# Patient Record
Sex: Male | Born: 1935 | Race: White | Hispanic: No | Marital: Married | Smoking: Former smoker
Health system: Southern US, Community
[De-identification: ages and names within clinical notes are randomized; demographics above are authoritative.]

## PROBLEM LIST (undated history)

## (undated) DIAGNOSIS — I10 Essential (primary) hypertension: Secondary | ICD-10-CM

## (undated) DIAGNOSIS — N400 Enlarged prostate without lower urinary tract symptoms: Secondary | ICD-10-CM

## (undated) DIAGNOSIS — C4490 Unspecified malignant neoplasm of skin, unspecified: Secondary | ICD-10-CM

## (undated) DIAGNOSIS — Z95828 Presence of other vascular implants and grafts: Secondary | ICD-10-CM

## (undated) DIAGNOSIS — I779 Disorder of arteries and arterioles, unspecified: Secondary | ICD-10-CM

## (undated) DIAGNOSIS — E785 Hyperlipidemia, unspecified: Secondary | ICD-10-CM

## (undated) DIAGNOSIS — Z950 Presence of cardiac pacemaker: Secondary | ICD-10-CM

## (undated) DIAGNOSIS — I739 Peripheral vascular disease, unspecified: Secondary | ICD-10-CM

## (undated) DIAGNOSIS — I480 Paroxysmal atrial fibrillation: Secondary | ICD-10-CM

## (undated) DIAGNOSIS — F411 Generalized anxiety disorder: Secondary | ICD-10-CM

## (undated) DIAGNOSIS — Z952 Presence of prosthetic heart valve: Secondary | ICD-10-CM

## (undated) DIAGNOSIS — I350 Nonrheumatic aortic (valve) stenosis: Secondary | ICD-10-CM

## (undated) DIAGNOSIS — A0472 Enterocolitis due to Clostridium difficile, not specified as recurrent: Secondary | ICD-10-CM

## (undated) DIAGNOSIS — I2510 Atherosclerotic heart disease of native coronary artery without angina pectoris: Secondary | ICD-10-CM

## (undated) HISTORY — DX: Peripheral vascular disease, unspecified: I73.9

## (undated) HISTORY — DX: Paroxysmal atrial fibrillation: I48.0

## (undated) HISTORY — DX: Enterocolitis due to Clostridium difficile, not specified as recurrent: A04.72

## (undated) HISTORY — DX: Hyperlipidemia, unspecified: E78.5

## (undated) HISTORY — DX: Essential (primary) hypertension: I10

---

## 1996-08-30 HISTORY — PX: TRANSURETHRAL RESECTION OF PROSTATE: SHX73

## 1997-08-30 HISTORY — PX: CARDIAC CATHETERIZATION: SHX172

## 1998-04-28 ENCOUNTER — Encounter: Payer: Self-pay | Admitting: Cardiology

## 1998-04-28 ENCOUNTER — Inpatient Hospital Stay (HOSPITAL_COMMUNITY): Admission: AD | Admit: 1998-04-28 | Discharge: 1998-05-08 | Payer: Self-pay | Admitting: Cardiology

## 1998-04-30 ENCOUNTER — Encounter: Payer: Self-pay | Admitting: Thoracic Surgery (Cardiothoracic Vascular Surgery)

## 1998-04-30 HISTORY — PX: CORONARY ARTERY BYPASS GRAFT: SHX141

## 1998-05-02 ENCOUNTER — Encounter: Payer: Self-pay | Admitting: Thoracic Surgery (Cardiothoracic Vascular Surgery)

## 1998-05-05 ENCOUNTER — Encounter: Payer: Self-pay | Admitting: Cardiothoracic Surgery

## 1998-05-06 ENCOUNTER — Encounter: Payer: Self-pay | Admitting: Cardiothoracic Surgery

## 1998-05-07 ENCOUNTER — Encounter: Payer: Self-pay | Admitting: Thoracic Surgery (Cardiothoracic Vascular Surgery)

## 1998-05-27 ENCOUNTER — Encounter (HOSPITAL_COMMUNITY): Admission: RE | Admit: 1998-05-27 | Discharge: 1998-08-25 | Payer: Self-pay | Admitting: Cardiology

## 1999-08-17 ENCOUNTER — Encounter (INDEPENDENT_AMBULATORY_CARE_PROVIDER_SITE_OTHER): Payer: Self-pay

## 1999-08-17 ENCOUNTER — Ambulatory Visit (HOSPITAL_COMMUNITY): Admission: RE | Admit: 1999-08-17 | Discharge: 1999-08-17 | Payer: Self-pay | Admitting: Gastroenterology

## 1999-11-12 ENCOUNTER — Other Ambulatory Visit: Admission: RE | Admit: 1999-11-12 | Discharge: 1999-11-12 | Payer: Self-pay | Admitting: Urology

## 2000-06-05 ENCOUNTER — Encounter: Payer: Self-pay | Admitting: Emergency Medicine

## 2000-06-05 ENCOUNTER — Emergency Department (HOSPITAL_COMMUNITY): Admission: EM | Admit: 2000-06-05 | Discharge: 2000-06-05 | Payer: Self-pay | Admitting: Emergency Medicine

## 2000-06-30 HISTORY — PX: INTRAOPERATIVE ARTERIOGRAM: SHX5157

## 2000-07-06 ENCOUNTER — Ambulatory Visit (HOSPITAL_COMMUNITY): Admission: RE | Admit: 2000-07-06 | Discharge: 2000-07-06 | Payer: Self-pay | Admitting: *Deleted

## 2000-07-06 ENCOUNTER — Encounter: Payer: Self-pay | Admitting: *Deleted

## 2000-07-11 ENCOUNTER — Encounter: Payer: Self-pay | Admitting: *Deleted

## 2000-07-13 ENCOUNTER — Encounter (INDEPENDENT_AMBULATORY_CARE_PROVIDER_SITE_OTHER): Payer: Self-pay | Admitting: Specialist

## 2000-07-13 ENCOUNTER — Inpatient Hospital Stay (HOSPITAL_COMMUNITY): Admission: RE | Admit: 2000-07-13 | Discharge: 2000-07-15 | Payer: Self-pay | Admitting: *Deleted

## 2001-03-10 ENCOUNTER — Ambulatory Visit (HOSPITAL_BASED_OUTPATIENT_CLINIC_OR_DEPARTMENT_OTHER): Admission: RE | Admit: 2001-03-10 | Discharge: 2001-03-10 | Payer: Self-pay | Admitting: Orthopedic Surgery

## 2001-05-31 ENCOUNTER — Encounter: Payer: Self-pay | Admitting: Chiropractic Medicine

## 2001-05-31 ENCOUNTER — Encounter: Admission: RE | Admit: 2001-05-31 | Discharge: 2001-05-31 | Payer: Self-pay | Admitting: Chiropractic Medicine

## 2002-08-21 ENCOUNTER — Encounter: Payer: Self-pay | Admitting: Gastroenterology

## 2002-08-21 ENCOUNTER — Encounter: Admission: RE | Admit: 2002-08-21 | Discharge: 2002-08-21 | Payer: Self-pay | Admitting: Gastroenterology

## 2002-08-30 HISTORY — PX: CAROTID ENDARTERECTOMY: SUR193

## 2002-09-18 ENCOUNTER — Ambulatory Visit (HOSPITAL_COMMUNITY): Admission: RE | Admit: 2002-09-18 | Discharge: 2002-09-18 | Payer: Self-pay | Admitting: Internal Medicine

## 2002-09-18 ENCOUNTER — Encounter: Payer: Self-pay | Admitting: Internal Medicine

## 2004-10-16 ENCOUNTER — Ambulatory Visit (HOSPITAL_COMMUNITY): Admission: RE | Admit: 2004-10-16 | Discharge: 2004-10-17 | Payer: Self-pay | Admitting: *Deleted

## 2005-12-29 ENCOUNTER — Encounter: Payer: Self-pay | Admitting: Thoracic Surgery (Cardiothoracic Vascular Surgery)

## 2007-03-08 ENCOUNTER — Encounter: Admission: RE | Admit: 2007-03-08 | Discharge: 2007-04-12 | Payer: Self-pay | Admitting: Orthopedic Surgery

## 2008-07-10 ENCOUNTER — Encounter: Payer: Self-pay | Admitting: Cardiology

## 2009-06-03 ENCOUNTER — Encounter: Payer: Self-pay | Admitting: Cardiology

## 2009-07-16 ENCOUNTER — Inpatient Hospital Stay (HOSPITAL_COMMUNITY): Admission: RE | Admit: 2009-07-16 | Discharge: 2009-07-19 | Payer: Self-pay | Admitting: Orthopedic Surgery

## 2009-07-16 HISTORY — PX: TOTAL KNEE ARTHROPLASTY: SHX125

## 2009-08-11 ENCOUNTER — Encounter: Admission: RE | Admit: 2009-08-11 | Discharge: 2009-08-28 | Payer: Self-pay | Admitting: Orthopedic Surgery

## 2009-08-28 ENCOUNTER — Encounter: Admission: RE | Admit: 2009-08-28 | Discharge: 2009-10-07 | Payer: Self-pay | Admitting: Orthopedic Surgery

## 2009-08-30 HISTORY — PX: CATARACT EXTRACTION, BILATERAL: SHX1313

## 2010-03-23 ENCOUNTER — Inpatient Hospital Stay (HOSPITAL_COMMUNITY): Admission: RE | Admit: 2010-03-23 | Discharge: 2010-03-24 | Payer: Self-pay | Admitting: Cardiology

## 2010-03-25 HISTORY — PX: PACEMAKER INSERTION: SHX728

## 2010-03-30 DIAGNOSIS — A0472 Enterocolitis due to Clostridium difficile, not specified as recurrent: Secondary | ICD-10-CM

## 2010-03-30 HISTORY — DX: Enterocolitis due to Clostridium difficile, not specified as recurrent: A04.72

## 2010-04-21 ENCOUNTER — Inpatient Hospital Stay (HOSPITAL_COMMUNITY): Admission: EM | Admit: 2010-04-21 | Discharge: 2010-04-24 | Payer: Self-pay | Admitting: Emergency Medicine

## 2010-04-22 ENCOUNTER — Encounter (INDEPENDENT_AMBULATORY_CARE_PROVIDER_SITE_OTHER): Payer: Self-pay | Admitting: Internal Medicine

## 2010-05-02 ENCOUNTER — Inpatient Hospital Stay (HOSPITAL_COMMUNITY): Admission: EM | Admit: 2010-05-02 | Discharge: 2010-05-04 | Payer: Self-pay | Admitting: Emergency Medicine

## 2010-05-11 ENCOUNTER — Encounter (INDEPENDENT_AMBULATORY_CARE_PROVIDER_SITE_OTHER): Payer: Self-pay | Admitting: Internal Medicine

## 2010-05-14 ENCOUNTER — Inpatient Hospital Stay (HOSPITAL_COMMUNITY): Admission: EM | Admit: 2010-05-14 | Discharge: 2010-05-26 | Payer: Self-pay | Admitting: Emergency Medicine

## 2010-05-18 ENCOUNTER — Ambulatory Visit: Payer: Self-pay | Admitting: Infectious Diseases

## 2010-05-21 ENCOUNTER — Ambulatory Visit: Payer: Self-pay | Admitting: Vascular Surgery

## 2010-05-21 ENCOUNTER — Encounter (INDEPENDENT_AMBULATORY_CARE_PROVIDER_SITE_OTHER): Payer: Self-pay | Admitting: Internal Medicine

## 2010-05-21 DIAGNOSIS — B259 Cytomegaloviral disease, unspecified: Secondary | ICD-10-CM

## 2010-06-16 ENCOUNTER — Encounter: Payer: Self-pay | Admitting: Infectious Diseases

## 2010-06-24 ENCOUNTER — Ambulatory Visit: Payer: Self-pay | Admitting: Infectious Diseases

## 2010-06-24 DIAGNOSIS — I10 Essential (primary) hypertension: Secondary | ICD-10-CM | POA: Insufficient documentation

## 2010-06-24 DIAGNOSIS — A0472 Enterocolitis due to Clostridium difficile, not specified as recurrent: Secondary | ICD-10-CM | POA: Insufficient documentation

## 2010-06-24 DIAGNOSIS — K5190 Ulcerative colitis, unspecified, without complications: Secondary | ICD-10-CM

## 2010-06-24 HISTORY — DX: Essential (primary) hypertension: I10

## 2010-06-24 HISTORY — DX: Ulcerative colitis, unspecified, without complications: K51.90

## 2010-07-01 ENCOUNTER — Encounter: Payer: Self-pay | Admitting: Infectious Diseases

## 2010-07-08 ENCOUNTER — Telehealth: Payer: Self-pay | Admitting: Infectious Diseases

## 2010-07-13 ENCOUNTER — Encounter: Payer: Self-pay | Admitting: Infectious Diseases

## 2010-07-15 ENCOUNTER — Ambulatory Visit: Payer: Self-pay | Admitting: Psychiatry

## 2010-07-16 ENCOUNTER — Ambulatory Visit: Payer: Self-pay | Admitting: Psychiatry

## 2010-07-22 ENCOUNTER — Telehealth: Payer: Self-pay | Admitting: Infectious Diseases

## 2010-07-29 ENCOUNTER — Encounter: Payer: Self-pay | Admitting: Infectious Diseases

## 2010-07-29 LAB — CONVERTED CEMR LAB
Chloride: 101 meq/L (ref 96–112)
Glucose, Bld: 86 mg/dL (ref 70–99)
Lymphs Abs: 1.2 10*3/uL (ref 0.7–4.0)
MCV: 99.2 fL (ref 78.0–100.0)
Monocytes Absolute: 0.3 10*3/uL (ref 0.1–1.0)
Monocytes Relative: 3 % (ref 3–12)
Neutro Abs: 7.9 10*3/uL — ABNORMAL HIGH (ref 1.7–7.7)
Platelets: 279 10*3/uL (ref 150–400)
Potassium: 4.5 meq/L (ref 3.5–5.3)
RDW: 15.4 % (ref 11.5–15.5)
WBC: 9.5 10*3/uL (ref 4.0–10.5)

## 2010-08-04 ENCOUNTER — Telehealth: Payer: Self-pay

## 2010-08-10 ENCOUNTER — Encounter: Payer: Self-pay | Admitting: Infectious Diseases

## 2010-08-12 ENCOUNTER — Telehealth (INDEPENDENT_AMBULATORY_CARE_PROVIDER_SITE_OTHER): Payer: Self-pay | Admitting: *Deleted

## 2010-08-13 ENCOUNTER — Encounter: Payer: Self-pay | Admitting: Infectious Diseases

## 2010-09-01 ENCOUNTER — Ambulatory Visit
Admission: RE | Admit: 2010-09-01 | Discharge: 2010-09-01 | Payer: Self-pay | Source: Home / Self Care | Attending: Infectious Diseases | Admitting: Infectious Diseases

## 2010-09-30 ENCOUNTER — Encounter: Payer: Self-pay | Admitting: Infectious Diseases

## 2010-10-01 NOTE — Progress Notes (Signed)
Summary: Pt. a difficult lab draw in the home, requesting labs here  Phone Note Other Incoming   Caller: Garey Ham, RN [place] Go [phone] Reason for Call: Discuss lab or test results Summary of Call: Pt. has become a very diificult lab draw in the home.  Has not been able to perform the lab draw for the past 3 weeks.  Wanting to know whether the pt. can come to the [facility] for the lab draws now that the pt. is more mobile.  The pt's wife does drive.  He continues to has 6-8 diarrheal stools a day which is an approvement from when he was d/ced from the hospital.  Please advise or call the number above with new orders.  Lorne Skeens RN  July 08, 2010 11:37 AM   Follow-up for Phone Call        please have pt come to [facility] for labs thanks

## 2010-10-01 NOTE — Progress Notes (Signed)
Summary: orders for [facility]  Phone Note Call from Patient   Caller: Patient Summary of Call: Call from pt. about lab orders to [facility].  Spoke with [employer] [facility] [employer] and they need a new order every week.  Pt. should have a CBC with Diff, BMP, Phosphorous, and Magnesium called to [facility] at [phone] after Dr. Ninetta Lights reviews each lab. Initial call taken by: Wendall Mola CMA Duncan Dull),  August 12, 2010 11:58 AM

## 2010-10-01 NOTE — Consult Note (Signed)
Summary: [facility]   Imported By: Florinda Marker 07/14/2010 15:42:56  _____________________________________________________________________  External Attachment:    Type:   Image     Comment:   External Document

## 2010-10-01 NOTE — Assessment & Plan Note (Signed)
Summary: 11:30 hsfu/CME proctotitis   Vital Signs:  Patient profile:   77 year old male Height:      71 inches (180.34 cm) Weight:      151 pounds (0 kg) BMI:     0 Temp:     97.5 degrees F (36.39 degrees C) oral Pulse rate:   66 / minute BP sitting:   137 / 77  (left arm)  Vitals Entered By: Rocky Morel) (June 24, 2010 12:02 PM) Pain Assessment Patient in pain? no      Nutritional Status Detail appetite is good per patient  Does patient need assistance? Functional Status Self care Ambulation Normal Comments patient uses a walker now and transitioning to a cane.   History of Present Illness: 77 yo M with a past medical history of ulcerative colitis,   recently discharge May 04, 2010.  Previous admission before that April 24, 2010.   He followed up with  Dr. Cristina Gong September 12 and had a colonoscopy.  The patient showed   some ulcerations, which were biopsied.  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 He was treated with Valcyte. His course in the hospital was complicated by a C diff recurrence for which he was started on a Vancomycin taper.   Since d/c he has been "improving gradually"- his strength, endurance and wt have all improved. Has been getting PT and nursing at home. Had repeat sigmoidoscopy last week (06-18-10)and his ulcerations were 50% improved vs previous. No bx taken. Still has diarrhea. More solid than previous but occas unexpected movements. Still wearing "depends" Has been eating well.   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   Social History: Married Never Smoked Alcohol use-no  Review of Systems       no fever or chills. no dysphagia, no vision changes. hoarseness of voice. no oral sores. bilateral LE edema.   Physical Exam  General:  well-developed, well-nourished, well-hydrated, and pale.   Eyes:  pupils equal, pupils round, and pupils reactive to light.   Mouth:  pharynx pink and moist and no exudates.   Neck:  no masses.   Lungs:  normal respiratory effort and normal breath sounds.   Heart:  normal rate, regular rhythm, and no murmur.   Abdomen:  soft, non-tender, and normal bowel sounds.   Extremities:  left pretibial edema and right pretibial edema.     Impression & Recommendations:  Problem # 1:  CYTOMEGALOVIRAL DISEASE (ICD-078.5) He appears to be improving. His continued ulcers on his recent sigmoidoscopy are concerning that he has not yet completely healed this. Given this will continue his valcyte for an additional month. He believes that he will have a repeat sigmoidoscopy in the next month. Will continue his medications until this time and will plan to stop the Valcyte pending these results. He will return to clinic after his sigmoidoscopy.   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 he is improving off therapy. Pseudomembranes not seen on recent sigmoidoscopy.   Orders: Consultation Level IV (37342)  Medications Added to Medication List This Visit: 1)  Lexapro 10 Mg Tabs (Escitalopram oxalate) .... Take 1 tablet by mouth once a day 2)  Prednisone 10 Mg Tabs (Prednisone) .... Take 1 tablet by mouth once a day 3)  Avodart 0.5 Mg Caps (Dutasteride) .... Take one tablet by mouth every other day 4)  Benicar Hct 40-12.5 Mg Tabs (Olmesartan  medoxomil-hctz) .... Take 1 tablet by mouth once a day 5)  Toprol Xl 25 Mg Xr24h-tab (Metoprolol succinate) .... Take 1 tablet by mouth once a day 6)  Florastor 250 Mg Caps (Saccharomyces boulardii) .... 2 tabs once daily 7)  Valcyte 450 Mg Tabs (Valganciclovir hcl) .... 2 tabs by mouth once daily 8)  Furosemide 20 Mg Tabs (Furosemide) .... Every other day 9)  K-tabs 10 Meq Cr-tabs (Potassium chloride) .... Take 1 tablet by mouth two times a day 10)  Multivitamins Tabs (Multiple vitamin) .... Take 1 tablet by mouth once a day Prescriptions: VALCYTE 450 MG TABS (VALGANCICLOVIR HCL) 2 tabs by mouth once daily  #60 x 1   Entered and Authorized by:   Bobby Rumpf MD   Signed by:   Bobby Rumpf MD on 06/24/2010   Method used:   Electronically to        [facility]. [phone]* (retail)       [address] [place]  [address]       Ph: [phone]       Fax: [phone]   RxID:   [id number]    Orders Added: 1)  Consultation Level IV [88916]

## 2010-10-01 NOTE — Procedures (Signed)
Summary: [facility]  [facility]   Imported By: Florinda Marker 09/08/2010 09:16:39  _____________________________________________________________________  External Attachment:    Type:   Image     Comment:   External Document

## 2010-10-01 NOTE — Progress Notes (Signed)
Summary: Request for lab orders   Phone Note Call from Patient   Caller: [facility]  Danille Kevan Ny Summary of Call: [facility] was not able to draw labs on this patient.   Requesting lab orders for  the [facility] on [address] which is convient for the patient.    Follow-up for Phone Call        ok for pt to  have labs drawn at [facility] on [address] @ [facility] [phone] ph   fax [phone]  Orders faxed  Tomasita Morrow RN  July 22, 2010 11:51 AM  Pt would like to know if he can do labs every other week instead of weekly?   Additional Follow-up for Phone Call Additional follow up Details #1::        needs weekly due to his medicaiton

## 2010-10-01 NOTE — Assessment & Plan Note (Signed)
Summary: 1 month f/u [mkj]   CC:  follow-up visit.  History of Present Illness: 77 yo M with a past medical history of ulcerative colitis, discharged May 04, 2010.  Previous admission before that April 24, 2010.   He followed up with  Dr. Cristina Gong September 12 and had a colonoscopy which  some ulcerations, which were biopsied.  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 He was treated with Valcyte. His course in the hospital was complicated by a C diff recurrence for which he was started on a Vancomycin taper.   Since d/c he has been "improving gradually"- his strength, endurance and wt have all improved. Has been getting PT and nursing at home. Had repeat sigmoidoscopy last week (06-18-10)and his ulcerations were 50% improved vs previous. No bx taken. his vanco taper was completed.  Was seen in ID clinic end of October and continued on an additional month of Valtrex due to persistent (but healing ) ulcers on sigmoidoscopy.  This has been again refilled in Dec 2011.  Today states that he has been feeling well- has gained wt, apetitie is good, moving bowels ok. Believes his UC flare up has resolved and his darrhea has resolved as well. no f/c. Had repeat colonoscopy on Dec 14- path showed no viral effects but some inflamation. steroids being tapered to off by 1/mg/week. currently at 60m. Has been having weekly blood draws at [facility].   Preventive Screening-Counseling & Management  Alcohol-Tobacco     Alcohol drinks/day: 0     Smoking Status: never  Caffeine-Diet-Exercise     Caffeine use/day: no     Does Patient Exercise: yes     Type of exercise: PT-home therapy  Safety-Violence-Falls     Seat Belt Use: yes   Current Allergies (reviewed today): No known allergies  Review of Systems       wt up 26#.  Vital Signs:  Patient profile:   77year old male Height:      71 inches (180.34 cm) Weight:       177.25 pounds (80.57 kg) BMI:     24.81 Temp:     97.5 degrees F (36.39 degrees C) oral Pulse rate:   87 / minute BP sitting:   163 / 83  (left arm) Cuff size:   large  Vitals Entered By: DLorne SkeensRN (September 01, 2010 2:44 PM) CC: follow-up visit Is Patient Diabetic? No Pain Assessment Patient in pain? no      Nutritional Status BMI of 19 -24 = normal Nutritional Status Detail appetite "good"  Have you ever been in a relationship where you felt threatened, hurt or afraid?not asked today   Does patient need assistance? Functional Status Self care Ambulation Impaired:Risk for fall Comments uses cane for stability   Physical Exam  General:  well-developed, well-nourished, and well-hydrated.   Abdomen:  soft, non-tender, and normal bowel sounds.   Skin:  diffuse brusing, echymosis. mild.    Impression & Recommendations:  Problem # 1:  CYTOMEGALOVIRAL DISEASE (ICD-078.5)  he has improved. his recent bx showed no viral cytopathic effects. Will stop his Valcyte, stop his lba monitoring. [facility]. Hopefully will improve. I explained to him andhis wife that CMV may return due to a recurrence of his UC, increase in his steroid dose.  Will return to clinic as needed  Orders: Est. Patient Level III (99787)  Problem # 2:  CLOSTRIDIUM DIFFICILE COLITIS (ICD-008.45) Assessment: Improved resolved  Medications Added to Medication List This Visit: 1)  Prednisone 10 Mg Tabs (Prednisone) .... 37m once daily , dropping1 mg/wk

## 2010-10-01 NOTE — Consult Note (Signed)
[facility] @ [name] [name]   Imported By: Florinda Marker 07/16/2010 08:42:38  _____________________________________________________________________  External Attachment:    Type:   Image     Comment:   External Document

## 2010-10-01 NOTE — Miscellaneous (Signed)
Summary: HIPAA Restrictions  HIPAA Restrictions   Imported By: Bonner Puna 06/25/2010 13:26:13  _____________________________________________________________________  External Attachment:    Type:   Image     Comment:   External Document

## 2010-10-27 NOTE — Consult Note (Signed)
Summary: [facility]   Imported By: Florinda Marker 10/21/2010 10:22:06  _____________________________________________________________________  External Attachment:    Type:   Image     Comment:   External Document

## 2010-11-10 ENCOUNTER — Encounter: Payer: Self-pay | Admitting: *Deleted

## 2010-11-12 LAB — POCT I-STAT, CHEM 8
BUN: 35 mg/dL — ABNORMAL HIGH (ref 6–23)
Chloride: 101 mEq/L (ref 96–112)
Creatinine, Ser: 1.1 mg/dL (ref 0.4–1.5)
Potassium: 3.5 mEq/L (ref 3.5–5.1)
Sodium: 131 mEq/L — ABNORMAL LOW (ref 135–145)
TCO2: 22 mmol/L (ref 0–100)

## 2010-11-12 LAB — CULTURE, BLOOD (ROUTINE X 2)
Culture: NO GROWTH
Culture: NO GROWTH
Culture: NO GROWTH

## 2010-11-12 LAB — DIFFERENTIAL
Basophils Absolute: 0 10*3/uL (ref 0.0–0.1)
Basophils Absolute: 0 10*3/uL (ref 0.0–0.1)
Basophils Absolute: 0 10*3/uL (ref 0.0–0.1)
Basophils Absolute: 0 10*3/uL (ref 0.0–0.1)
Basophils Absolute: 0 10*3/uL (ref 0.0–0.1)
Basophils Relative: 0 % (ref 0–1)
Basophils Relative: 0 % (ref 0–1)
Basophils Relative: 0 % (ref 0–1)
Eosinophils Absolute: 0 10*3/uL (ref 0.0–0.7)
Eosinophils Absolute: 0 10*3/uL (ref 0.0–0.7)
Eosinophils Absolute: 0 10*3/uL (ref 0.0–0.7)
Eosinophils Relative: 0 % (ref 0–5)
Eosinophils Relative: 0 % (ref 0–5)
Eosinophils Relative: 0 % (ref 0–5)
Eosinophils Relative: 1 % (ref 0–5)
Lymphocytes Relative: 14 % (ref 12–46)
Lymphocytes Relative: 12 % (ref 12–46)
Lymphs Abs: 0.7 10*3/uL (ref 0.7–4.0)
Lymphs Abs: 1.5 10*3/uL (ref 0.7–4.0)
Monocytes Absolute: 0 10*3/uL — ABNORMAL LOW (ref 0.1–1.0)
Monocytes Absolute: 0 10*3/uL — ABNORMAL LOW (ref 0.1–1.0)
Monocytes Absolute: 0 10*3/uL — ABNORMAL LOW (ref 0.1–1.0)
Monocytes Absolute: 0.1 10*3/uL (ref 0.1–1.0)
Monocytes Absolute: 0.2 10*3/uL (ref 0.1–1.0)
Monocytes Relative: 0 % — ABNORMAL LOW (ref 3–12)
Monocytes Relative: 2 % — ABNORMAL LOW (ref 3–12)
Neutro Abs: 3.4 10*3/uL (ref 1.7–7.7)
Neutro Abs: 4.3 10*3/uL (ref 1.7–7.7)
Neutro Abs: 4.8 10*3/uL (ref 1.7–7.7)
Neutrophils Relative %: 92 % — ABNORMAL HIGH (ref 43–77)
Neutrophils Relative %: 81 % — ABNORMAL HIGH (ref 43–77)

## 2010-11-12 LAB — BASIC METABOLIC PANEL
BUN: 17 mg/dL (ref 6–23)
BUN: 18 mg/dL (ref 6–23)
BUN: 18 mg/dL (ref 6–23)
BUN: 19 mg/dL (ref 6–23)
BUN: 20 mg/dL (ref 6–23)
BUN: 26 mg/dL — ABNORMAL HIGH (ref 6–23)
CO2: 26 mEq/L (ref 19–32)
CO2: 22 mEq/L (ref 19–32)
Calcium: 8.4 mg/dL (ref 8.4–10.5)
Calcium: 8.6 mg/dL (ref 8.4–10.5)
Chloride: 101 mEq/L (ref 96–112)
Chloride: 101 mEq/L (ref 96–112)
Chloride: 102 mEq/L (ref 96–112)
Chloride: 103 mEq/L (ref 96–112)
Creatinine, Ser: 0.72 mg/dL (ref 0.4–1.5)
Creatinine, Ser: 0.74 mg/dL (ref 0.4–1.5)
Creatinine, Ser: 0.82 mg/dL (ref 0.4–1.5)
GFR calc Af Amer: >60 mL/min (ref >60)
GFR calc non Af Amer: >60 mL/min (ref >60)
GFR calc non Af Amer: >60 mL/min (ref >60)
GFR calc non Af Amer: >60 mL/min (ref >60)
GFR calc non Af Amer: >60 mL/min (ref >60)
GFR calc non Af Amer: >60 mL/min (ref >60)
Glucose, Bld: 107 mg/dL — ABNORMAL HIGH (ref 70–99)
Glucose, Bld: 95 mg/dL (ref 70–99)
Glucose, Bld: 97 mg/dL (ref 70–99)
Glucose, Bld: 98 mg/dL (ref 70–99)
Glucose, Bld: 139 mg/dL — ABNORMAL HIGH (ref 70–99)
Potassium: 2.9 mEq/L — ABNORMAL LOW (ref 3.5–5.1)
Potassium: 3.2 mEq/L — ABNORMAL LOW (ref 3.5–5.1)
Potassium: 3.5 mEq/L (ref 3.5–5.1)
Potassium: 3.7 mEq/L (ref 3.5–5.1)
Potassium: 3.9 mEq/L (ref 3.5–5.1)
Sodium: 133 mEq/L — ABNORMAL LOW (ref 135–145)
Sodium: 134 mEq/L — ABNORMAL LOW (ref 135–145)
Sodium: 134 mEq/L — ABNORMAL LOW (ref 135–145)
Sodium: 137 mEq/L (ref 135–145)

## 2010-11-12 LAB — GLUCOSE, CAPILLARY
Glucose-Capillary: 100 mg/dL — ABNORMAL HIGH (ref 70–99)
Glucose-Capillary: 104 mg/dL — ABNORMAL HIGH (ref 70–99)
Glucose-Capillary: 106 mg/dL — ABNORMAL HIGH (ref 70–99)
Glucose-Capillary: 117 mg/dL — ABNORMAL HIGH (ref 70–99)
Glucose-Capillary: 120 mg/dL — ABNORMAL HIGH (ref 70–99)
Glucose-Capillary: 126 mg/dL — ABNORMAL HIGH (ref 70–99)
Glucose-Capillary: 127 mg/dL — ABNORMAL HIGH (ref 70–99)
Glucose-Capillary: 127 mg/dL — ABNORMAL HIGH (ref 70–99)
Glucose-Capillary: 130 mg/dL — ABNORMAL HIGH (ref 70–99)
Glucose-Capillary: 145 mg/dL — ABNORMAL HIGH (ref 70–99)
Glucose-Capillary: 147 mg/dL — ABNORMAL HIGH (ref 70–99)
Glucose-Capillary: 148 mg/dL — ABNORMAL HIGH (ref 70–99)
Glucose-Capillary: 153 mg/dL — ABNORMAL HIGH (ref 70–99)
Glucose-Capillary: 154 mg/dL — ABNORMAL HIGH (ref 70–99)
Glucose-Capillary: 171 mg/dL — ABNORMAL HIGH (ref 70–99)
Glucose-Capillary: 172 mg/dL — ABNORMAL HIGH (ref 70–99)
Glucose-Capillary: 180 mg/dL — ABNORMAL HIGH (ref 70–99)
Glucose-Capillary: 185 mg/dL — ABNORMAL HIGH (ref 70–99)
Glucose-Capillary: 195 mg/dL — ABNORMAL HIGH (ref 70–99)
Glucose-Capillary: 223 mg/dL — ABNORMAL HIGH (ref 70–99)
Glucose-Capillary: 265 mg/dL — ABNORMAL HIGH (ref 70–99)
Glucose-Capillary: 81 mg/dL (ref 70–99)
Glucose-Capillary: 95 mg/dL (ref 70–99)
Glucose-Capillary: 96 mg/dL (ref 70–99)

## 2010-11-12 LAB — PHOSPHORUS
Phosphorus: 2.3 mg/dL (ref 2.3–4.6)
Phosphorus: 2.8 mg/dL (ref 2.3–4.6)

## 2010-11-12 LAB — COMPREHENSIVE METABOLIC PANEL
ALT: 31 U/L (ref 0–53)
ALT: 27 U/L (ref 0–53)
AST: 14 U/L (ref 0–37)
AST: 24 U/L (ref 0–37)
AST: 24 U/L (ref 0–37)
Albumin: 2.2 g/dL — ABNORMAL LOW (ref 3.5–5.2)
Albumin: 2.2 g/dL — ABNORMAL LOW (ref 3.5–5.2)
Albumin: 2.6 g/dL — ABNORMAL LOW (ref 3.5–5.2)
Albumin: 2.8 g/dL — ABNORMAL LOW (ref 3.5–5.2)
Alkaline Phosphatase: 47 U/L (ref 39–117)
BUN: 17 mg/dL (ref 6–23)
Calcium: 8 mg/dL — ABNORMAL LOW (ref 8.4–10.5)
Calcium: 8.5 mg/dL (ref 8.4–10.5)
Chloride: 98 mEq/L (ref 96–112)
Creatinine, Ser: 0.77 mg/dL (ref 0.4–1.5)
Creatinine, Ser: 1.05 mg/dL (ref 0.4–1.5)
GFR calc Af Amer: >60 mL/min (ref >60)
GFR calc Af Amer: >60 mL/min (ref >60)
GFR calc Af Amer: >60 mL/min (ref >60)
GFR calc Af Amer: >60 mL/min (ref >60)
Glucose, Bld: 131 mg/dL — ABNORMAL HIGH (ref 70–99)
Potassium: 3.9 mEq/L (ref 3.5–5.1)
Potassium: 4.3 mEq/L (ref 3.5–5.1)
Sodium: 128 mEq/L — ABNORMAL LOW (ref 135–145)
Sodium: 131 mEq/L — ABNORMAL LOW (ref 135–145)
Total Bilirubin: 0.5 mg/dL (ref 0.3–1.2)
Total Bilirubin: 1 mg/dL (ref 0.3–1.2)
Total Protein: 4.3 g/dL — ABNORMAL LOW (ref 6.0–8.3)
Total Protein: 4.9 g/dL — ABNORMAL LOW (ref 6.0–8.3)

## 2010-11-12 LAB — HEMOCCULT GUIAC POC 1CARD (OFFICE): Fecal Occult Bld: POSITIVE

## 2010-11-12 LAB — RAPID URINE DRUG SCREEN, HOSP PERFORMED
Amphetamines: NOT DETECTED
Barbiturates: NOT DETECTED
Tetrahydrocannabinol: NOT DETECTED

## 2010-11-12 LAB — URINALYSIS, ROUTINE W REFLEX MICROSCOPIC
Bilirubin Urine: NEGATIVE
Bilirubin Urine: NEGATIVE
Glucose, UA: NEGATIVE mg/dL
Glucose, UA: NEGATIVE mg/dL
Hgb urine dipstick: NEGATIVE
Hgb urine dipstick: NEGATIVE
Ketones, ur: NEGATIVE mg/dL
Ketones, ur: NEGATIVE mg/dL
Nitrite: NEGATIVE
Protein, ur: NEGATIVE mg/dL
Protein, ur: NEGATIVE mg/dL
Specific Gravity, Urine: 1.011 (ref 1.005–1.030)

## 2010-11-12 LAB — CBC
HCT: 29.4 % — ABNORMAL LOW (ref 39.0–52.0)
HCT: 30.2 % — ABNORMAL LOW (ref 39.0–52.0)
HCT: 31.5 % — ABNORMAL LOW (ref 39.0–52.0)
HCT: 31.7 % — ABNORMAL LOW (ref 39.0–52.0)
HCT: 32.2 % — ABNORMAL LOW (ref 39.0–52.0)
HCT: 30.1 % — ABNORMAL LOW (ref 39.0–52.0)
Hemoglobin: 10.1 g/dL — ABNORMAL LOW (ref 13.0–17.0)
Hemoglobin: 10.9 g/dL — ABNORMAL LOW (ref 13.0–17.0)
Hemoglobin: 10.9 g/dL — ABNORMAL LOW (ref 13.0–17.0)
Hemoglobin: 11.1 g/dL — ABNORMAL LOW (ref 13.0–17.0)
Hemoglobin: 10.3 g/dL — ABNORMAL LOW (ref 13.0–17.0)
Hemoglobin: 8.2 g/dL — ABNORMAL LOW (ref 13.0–17.0)
MCH: 32.5 pg (ref 26.0–34.0)
MCH: 32.6 pg (ref 26.0–34.0)
MCH: 31.7 pg (ref 26.0–34.0)
MCH: 31.9 pg (ref 26.0–34.0)
MCHC: 34.9 g/dL (ref 30.0–36.0)
MCHC: 35 g/dL (ref 30.0–36.0)
MCHC: 34.2 g/dL (ref 30.0–36.0)
MCHC: 34.2 g/dL (ref 30.0–36.0)
MCV: 93.2 fL (ref 78.0–100.0)
MCV: 93.9 fL (ref 78.0–100.0)
MCV: 94.3 fL (ref 78.0–100.0)
MCV: 94.5 fL (ref 78.0–100.0)
MCV: 94.6 fL (ref 78.0–100.0)
MCV: 92.6 fL (ref 78.0–100.0)
Platelets: 110 10*3/uL — ABNORMAL LOW (ref 150–400)
Platelets: 179 10*3/uL (ref 150–400)
Platelets: 189 10*3/uL (ref 150–400)
RBC: 2.93 MIL/uL — ABNORMAL LOW (ref 4.22–5.81)
RBC: 3.36 MIL/uL — ABNORMAL LOW (ref 4.22–5.81)
RBC: 3.15 MIL/uL — ABNORMAL LOW (ref 4.22–5.81)
RDW: 16.2 % — ABNORMAL HIGH (ref 11.5–15.5)
RDW: 16.4 % — ABNORMAL HIGH (ref 11.5–15.5)
RDW: 16.8 % — ABNORMAL HIGH (ref 11.5–15.5)
RDW: 17 % — ABNORMAL HIGH (ref 11.5–15.5)
RDW: 15.8 % — ABNORMAL HIGH (ref 11.5–15.5)
WBC: 4.4 10*3/uL (ref 4.0–10.5)
WBC: 4.5 10*3/uL (ref 4.0–10.5)
WBC: 4.7 10*3/uL (ref 4.0–10.5)
WBC: 12.3 10*3/uL — ABNORMAL HIGH (ref 4.0–10.5)

## 2010-11-12 LAB — CLOSTRIDIUM DIFFICILE EIA

## 2010-11-12 LAB — POCT CARDIAC MARKERS
Myoglobin, poc: 159 ng/mL (ref 12–200)
Troponin i, poc: <0.05 ng/mL (ref 0.00–0.09)

## 2010-11-12 LAB — ACETAMINOPHEN LEVEL: Acetaminophen (Tylenol), Serum: 49.9 ug/mL — ABNORMAL HIGH (ref 10–30)

## 2010-11-12 LAB — URINE CULTURE
Colony Count: NO GROWTH
Culture: NO GROWTH

## 2010-11-12 LAB — GIARDIA/CRYPTOSPORIDIUM SCREEN(EIA)
Cryptosporidium Screen (EIA): NEGATIVE
Giardia Screen - EIA: NEGATIVE

## 2010-11-12 LAB — CK TOTAL AND CKMB (NOT AT ARMC)
Relative Index: 1.2 (ref 0.0–2.5)
Total CK: 194 U/L (ref 7–232)

## 2010-11-12 LAB — ETHANOL: Alcohol, Ethyl (B): <5 mg/dL (ref 0–10)

## 2010-11-12 LAB — TYPE AND SCREEN
ABO/RH(D): A NEG
Antibody Screen: NEGATIVE

## 2010-11-12 LAB — LIPASE, BLOOD: Lipase: 42 U/L (ref 11–59)

## 2010-11-12 LAB — HIV-1 RNA QUANT-NO REFLEX-BLD: HIV 1 RNA Quant: <20 copies/mL (ref <20)

## 2010-11-12 LAB — MAGNESIUM
Magnesium: 2 mg/dL (ref 1.5–2.5)
Magnesium: 2.1 mg/dL (ref 1.5–2.5)

## 2010-11-12 LAB — PROTIME-INR
INR: 1.13 (ref 0.00–1.49)
INR: 1.16 (ref 0.00–1.49)
Prothrombin Time: 14.7 seconds (ref 11.6–15.2)
Prothrombin Time: 15 seconds (ref 11.6–15.2)

## 2010-11-12 LAB — STOOL CULTURE

## 2010-11-12 LAB — CARDIAC PANEL(CRET KIN+CKTOT+MB+TROPI)
Relative Index: 1.5 (ref 0.0–2.5)
Total CK: 205 U/L (ref 7–232)
Troponin I: 0.01 ng/mL (ref 0.00–0.06)

## 2010-11-13 LAB — GLUCOSE, CAPILLARY
Glucose-Capillary: 138 mg/dL — ABNORMAL HIGH (ref 70–99)
Glucose-Capillary: 144 mg/dL — ABNORMAL HIGH (ref 70–99)
Glucose-Capillary: 166 mg/dL — ABNORMAL HIGH (ref 70–99)
Glucose-Capillary: 176 mg/dL — ABNORMAL HIGH (ref 70–99)
Glucose-Capillary: 270 mg/dL — ABNORMAL HIGH (ref 70–99)

## 2010-11-13 LAB — CBC
HCT: 31.2 % — ABNORMAL LOW (ref 39.0–52.0)
HCT: 32 % — ABNORMAL LOW (ref 39.0–52.0)
HCT: 34.3 % — ABNORMAL LOW (ref 39.0–52.0)
HCT: 37.2 % — ABNORMAL LOW (ref 39.0–52.0)
HCT: 37.9 % — ABNORMAL LOW (ref 39.0–52.0)
Hemoglobin: 10.6 g/dL — ABNORMAL LOW (ref 13.0–17.0)
Hemoglobin: 10.7 g/dL — ABNORMAL LOW (ref 13.0–17.0)
Hemoglobin: 11.5 g/dL — ABNORMAL LOW (ref 13.0–17.0)
Hemoglobin: 11.7 g/dL — ABNORMAL LOW (ref 13.0–17.0)
Hemoglobin: 12.9 g/dL — ABNORMAL LOW (ref 13.0–17.0)
MCH: 30.1 pg (ref 26.0–34.0)
MCH: 30.3 pg (ref 26.0–34.0)
MCH: 31 pg (ref 26.0–34.0)
MCHC: 33.5 g/dL (ref 30.0–36.0)
MCHC: 34.6 g/dL (ref 30.0–36.0)
MCHC: 34.7 g/dL (ref 30.0–36.0)
MCV: 89.9 fL (ref 78.0–100.0)
MCV: 90 fL (ref 78.0–100.0)
MCV: 91.7 fL (ref 78.0–100.0)
Platelets: 204 10*3/uL (ref 150–400)
Platelets: 206 10*3/uL (ref 150–400)
Platelets: 214 10*3/uL (ref 150–400)
RBC: 3.46 MIL/uL — ABNORMAL LOW (ref 4.22–5.81)
RBC: 3.51 MIL/uL — ABNORMAL LOW (ref 4.22–5.81)
RBC: 3.55 MIL/uL — ABNORMAL LOW (ref 4.22–5.81)
RBC: 3.74 MIL/uL — ABNORMAL LOW (ref 4.22–5.81)
RBC: 3.78 MIL/uL — ABNORMAL LOW (ref 4.22–5.81)
RBC: 4.15 MIL/uL — ABNORMAL LOW (ref 4.22–5.81)
RDW: 13.1 % (ref 11.5–15.5)
RDW: 13.6 % (ref 11.5–15.5)
RDW: 13.6 % (ref 11.5–15.5)
WBC: 10.3 10*3/uL (ref 4.0–10.5)
WBC: 10.8 10*3/uL — ABNORMAL HIGH (ref 4.0–10.5)
WBC: 15.8 10*3/uL — ABNORMAL HIGH (ref 4.0–10.5)
WBC: 7.5 10*3/uL (ref 4.0–10.5)
WBC: 8.8 10*3/uL (ref 4.0–10.5)

## 2010-11-13 LAB — DIFFERENTIAL
Basophils Absolute: 0 10*3/uL (ref 0.0–0.1)
Basophils Relative: 0 % (ref 0–1)
Eosinophils Relative: 0 % (ref 0–5)
Monocytes Absolute: 1 10*3/uL (ref 0.1–1.0)
Neutro Abs: 9.1 10*3/uL — ABNORMAL HIGH (ref 1.7–7.7)

## 2010-11-13 LAB — BASIC METABOLIC PANEL
BUN: 21 mg/dL (ref 6–23)
CO2: 23 mEq/L (ref 19–32)
Calcium: 8.6 mg/dL (ref 8.4–10.5)
Calcium: 8.7 mg/dL (ref 8.4–10.5)
Chloride: 102 mEq/L (ref 96–112)
GFR calc Af Amer: >60 mL/min (ref >60)
GFR calc Af Amer: >60 mL/min (ref >60)
GFR calc non Af Amer: >60 mL/min (ref >60)
GFR calc non Af Amer: >60 mL/min (ref >60)
Glucose, Bld: 109 mg/dL — ABNORMAL HIGH (ref 70–99)
Glucose, Bld: 123 mg/dL — ABNORMAL HIGH (ref 70–99)
Glucose, Bld: 150 mg/dL — ABNORMAL HIGH (ref 70–99)
Potassium: 2.8 mEq/L — ABNORMAL LOW (ref 3.5–5.1)
Potassium: 3.4 mEq/L — ABNORMAL LOW (ref 3.5–5.1)
Potassium: 3.6 mEq/L (ref 3.5–5.1)
Sodium: 133 mEq/L — ABNORMAL LOW (ref 135–145)
Sodium: 136 mEq/L (ref 135–145)

## 2010-11-13 LAB — TYPE AND SCREEN: Antibody Screen: NEGATIVE

## 2010-11-13 LAB — MAGNESIUM: Magnesium: 2.1 mg/dL (ref 1.5–2.5)

## 2010-11-13 LAB — CLOSTRIDIUM DIFFICILE EIA

## 2010-11-14 LAB — SURGICAL PCR SCREEN: MRSA, PCR: NEGATIVE

## 2010-11-26 NOTE — Progress Notes (Signed)
Summary: Pt. has ? about blood draw @ [facility]  Phone Note Call from Patient Call back at [facility] Phone [phone]   Caller: Patient Reason for Call: Talk to Nurse Summary of Call: Pt. has a question about the blood draw at the [place], [place].  Lorne Skeens RN  August 04, 2010 5:52 PM

## 2010-12-02 LAB — TYPE AND SCREEN
ABO/RH(D): A NEG
Antibody Screen: NEGATIVE

## 2010-12-02 LAB — BASIC METABOLIC PANEL
BUN: 10 mg/dL (ref 6–23)
BUN: 14 mg/dL (ref 6–23)
CO2: 26 mEq/L (ref 19–32)
Calcium: 8.3 mg/dL — ABNORMAL LOW (ref 8.4–10.5)
Calcium: 8.5 mg/dL (ref 8.4–10.5)
Creatinine, Ser: 0.73 mg/dL (ref 0.4–1.5)
GFR calc Af Amer: >60 mL/min (ref >60)
GFR calc non Af Amer: >60 mL/min (ref >60)
Glucose, Bld: 144 mg/dL — ABNORMAL HIGH (ref 70–99)
Potassium: 4.1 mEq/L (ref 3.5–5.1)

## 2010-12-02 LAB — URINALYSIS, ROUTINE W REFLEX MICROSCOPIC
Glucose, UA: NEGATIVE mg/dL
Hgb urine dipstick: NEGATIVE
Ketones, ur: NEGATIVE mg/dL
pH: 7 (ref 5.0–8.0)

## 2010-12-02 LAB — CBC
HCT: 31.1 % — ABNORMAL LOW (ref 39.0–52.0)
Hemoglobin: 15.4 g/dL (ref 13.0–17.0)
MCHC: 33.9 g/dL (ref 30.0–36.0)
MCHC: 34.3 g/dL (ref 30.0–36.0)
MCV: 94.1 fL (ref 78.0–100.0)
MCV: 94.5 fL (ref 78.0–100.0)
Platelets: 160 10*3/uL (ref 150–400)
Platelets: 170 10*3/uL (ref 150–400)
Platelets: 192 10*3/uL (ref 150–400)
RBC: 3.32 MIL/uL — ABNORMAL LOW (ref 4.22–5.81)
RBC: 3.58 MIL/uL — ABNORMAL LOW (ref 4.22–5.81)
RBC: 4.76 MIL/uL (ref 4.22–5.81)
RDW: 14.1 % (ref 11.5–15.5)
WBC: 8.3 10*3/uL (ref 4.0–10.5)
WBC: 9.6 10*3/uL (ref 4.0–10.5)
WBC: 9.8 10*3/uL (ref 4.0–10.5)
WBC: 9.2 10*3/uL (ref 4.0–10.5)

## 2010-12-02 LAB — COMPREHENSIVE METABOLIC PANEL
ALT: 13 U/L (ref 0–53)
AST: 19 U/L (ref 0–37)
Alkaline Phosphatase: 40 U/L (ref 39–117)
CO2: 30 mEq/L (ref 19–32)
Calcium: 9.9 mg/dL (ref 8.4–10.5)
GFR calc Af Amer: >60 mL/min (ref >60)
GFR calc non Af Amer: >60 mL/min (ref >60)
Glucose, Bld: 100 mg/dL — ABNORMAL HIGH (ref 70–99)
Potassium: 3.9 mEq/L (ref 3.5–5.1)
Sodium: 137 mEq/L (ref 135–145)

## 2010-12-02 LAB — PROTIME-INR
INR: 1.4 (ref 0.00–1.49)
INR: 1.52 — ABNORMAL HIGH (ref 0.00–1.49)
INR: 1.67 — ABNORMAL HIGH (ref 0.00–1.49)
Prothrombin Time: 17 seconds — ABNORMAL HIGH (ref 11.6–15.2)
Prothrombin Time: 18.2 seconds — ABNORMAL HIGH (ref 11.6–15.2)
Prothrombin Time: 19.6 seconds — ABNORMAL HIGH (ref 11.6–15.2)

## 2010-12-02 LAB — ABO/RH: ABO/RH(D): A NEG

## 2011-01-12 ENCOUNTER — Other Ambulatory Visit: Payer: Self-pay | Admitting: Gastroenterology

## 2011-01-15 NOTE — Op Note (Signed)
[place]  Patient:    Dalton Townsend, Dalton Townsend                   MRN: [id number] Proc. Date: 07/13/00 Adm. Date:  [id number] Disc. Date: [id number] Attending:  Lavonna Monarch CC:         Ronny Bacon. Dagmar Hait, M.D.  Elvia Collum, M.D.   Operative Report  SURGEON:  Gordy Clement, M.D.  SURGEON:  Lestine Box, R.N.F.A.  ANESTHESIA:  General endotracheal, Dr. Mickel Duhamel.  PREOPERATIVE DIAGNOSES: 1. Severe right internal carotid artery stenosis. 2. Non-disabling right hemispheric cerebrovascular accident.  POSTOPERATIVE DIAGNOSES: 1. Severe right internal carotid artery stenosis. 2. Non-disabling right hemispheric cerebrovascular accident.  PROCEDURE:  Right carotid endarterectomy with Dacron patch angioplasty.  CLINICAL NOTE:  This is a 77 year old male who was referred for evaluation of carotid artery occlusive disease associated with a non-disabling right hemispheric CVA. He was seen and evaluated in the office and subsequently underwent carotid arteriography verifying evidence of a severe right internal carotid artery stenosis. As a result of these findings, it was recommended to the patient that he undergo right carotid endarterectomy for reduction of ______. The risks of this operative procedure were discussed in detail including the potential risks of cerebrovascular accident, MI or death. The patient consented for surgery.  DESCRIPTION OF PROCEDURE:  The patient was brought to the operating room in stable condition. Placed under general endotracheal anesthesia. A Foley catheter arterial line inserted.  The right neck was prepped and draped in a sterile fashion.  A curvilinear skin incision was made along the anterior border of the right sternomastoid muscle. Incision extended deeply through the subcutaneous tissue. Dissection carried down through the platysma with electrocautery. Dissection carried down to the anterior border of the  sternomastoid muscle. Deep dissection carried down to expose the carotid sheath. The facial vein ligated with 2-0 silk tie and divided. The carotid bifurcation exposed. Proximal dissection carried down to the omohyoid muscle and the common carotid artery encircled at this level. The vagus nerve identified and reflected posteriorly and preserved. Distal dissection carried up to the carotid bifurcation and the superior thyroid and external carotid were encircled with fine vessiloops. The internal carotid artery was followed distally up to the posterior belly of the digastric muscle. The hypoglossal nerve reflected superiorly and preserved. The distal internal carotid artery encircled with a vessiloop.  The carotid bifurcation revealed extensive plaque disease extending from the bifurcation into the origin of the right internal carotid artery.  The patient was administered 7000 units of heparin intravenously. Adequate circulation time permitted. The carotid vessels controlled with clamps. Longitudinal arteriotomy made in the distal common carotid artery. The arteriotomy extended across the carotid bulb and up into the internal carotid artery. There was extensive calcific and non-calcific plaque occupying the carotid bifurcation in the origin of the right internal carotid artery. A high grade 90% stenosis was evident. Ulceration in the plaque was also evident. The shunt was inserted.  The plaque was removed with an endarterectomy elevator. Proximally the plaque was divided transversely with Potts scissors. The external carotid artery was endarterectomized using an eversion technique.  Plaque feathered well out of the internal carotid artery under direct vision. The site irrigated with heparin saline solution. Fragments of plaque removed with plaque forceps.  A preclotted knitted Dacron patch was then placed over the endarterectomy site using running 6-0 Prolene suture. Once the patch  angioplasty was completed, the shunt was removed. All vessels well flushed.  The clamps were removed directing initial antegrade up the external carotid artery. Following this, the internal carotid was released.  Adequate hemostasis was obtained. Sponge, needle and instrument counts were correct.  The sternomastoid fascia was then closed with running 2-0 Vicryl suture. Platysma closed with running 3-0 Vicryl suture. Staples were applied to the skin. Sterile dressings were applied. The patient tolerated the procedure well. There were no apparent complications. Transferred to the recovery room in stable neurologic condition at preoperative baseline. DD:  07/27/00 TD:  07/28/00 Job: [id number]

## 2011-01-15 NOTE — Discharge Summary (Signed)
NAMEMIHIR, FLANIGAN NO.:  [id number]   MEDICAL RECORD NO.:  [id number]          PATIENT TYPE:  OIB   LOCATION:  6979                         FACILITY:  [facility]   PHYSICIAN:  Kaylyn Lim., M.D.DATE OF BIRTH:  16-Apr-1923   DATE OF ADMISSION:  10/16/2004  DATE OF DISCHARGE:  10/17/2004                                 DISCHARGE SUMMARY   DISCHARGE DIAGNOSES:  1.  Coronary artery disease, status post coronary artery bypass grafting.  2.  Presyncope.  3.  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  4.  Hypertension.  5.  Dyslipidemia.   HISTORY OF PRESENT ILLNESS:  The patient is a 77 year old white male with  intermittent presyncope.  Holter monitor showed intermittent second and  third degree heart block.  Symptoms have been progressive over the last  couple of weeks.  The patient was admitted for dual-chamber pacemaker  placement.   HOSPITAL COURSE:  The patient was admitted on October 16, 2004 for elective  pacemaker placement.  The patient underwent pacemaker implantation without  difficulty.  Following the procedure, the patient did well.  He has had no  significant problems.  His pacemaker was interrogated on post procedure day  1 and functioning well.  Chest x-ray following the procedure showed no  pneumothorax with appropriate position of his ventricular and atria leads.  The patient will be discharged home today.   DISCHARGE MEDICATIONS:  1.  Norvasc 5 mg daily.  2.  Cozaar 100 mg daily.  3.  Zocor 10 mg p.o. every other day.  4.  Aspirin 81 mg daily.  5.  __________ 50 mg daily.  6.  Ditropan XL 5 mg daily.  7.  Pain management is Tylenol Extra Strength two pills q.6h. p.r.n. for      pain.   ACTIVITY:  The patient should have routine post pacemaker implantation  restrictions.   DIET:  Low cholesterol, low salt.   WOUND CARE:  The patient was instructed to Betadine,  Steri-Strips over  pacemaker site daily for the next 3 days.  He is to keep his pacemaker site  clean and dry for the next 5 days.  He will return in 1 week for a wound  check.  He  is to call the office on Monday at [phone] for an appointment.  Otherwise  he is to notify us should he have any bleeding, fever, drainage or swelling  at his pacer site.   The patient was given pacemaker instructions as well as restriction  instructions to both himself and his wife.      TWK/MEDQ  D:  10/17/2004  T:  10/19/2004  Job:  [id number]   cc:   Darlin Coco, M.D.  [address] [place]  [place] [address]  Fax: [phone]

## 2011-01-15 NOTE — Cardiovascular Report (Signed)
NAMEDARUS, Dalton Townsend NO.:  [id number]   MEDICAL RECORD NO.:  [id number]          PATIENT TYPE:  OIB   LOCATION:  2899                         FACILITY:  [place]   PHYSICIAN:  Kaylyn Lim., M.D.DATE OF BIRTH:  Dec 29, 1922   DATE OF PROCEDURE:  10/16/2004  DATE OF DISCHARGE:                              CARDIAC CATHETERIZATION   INDICATIONS FOR PROCEDURE:  Intermittent second and third-degree heart  block.   PROCEDURE DESCRIPTION:  The patient was brought to the cardiac  catheterization lab after appropriate informed consent.  He was prepped and  draped in sterile fashion.  Approximately 30 mL of 1% lidocaine was used for  local anesthesia.  A 2-inch incision was made in the left deltopectoral  groove.  A subcutaneous pocket was then made without difficulty.  A venogram  was then performed showing the layout of the left subclavian vein.  Left  subclavian vein was then punctured easily with return of dark, nonpulsatile  blood.  A 9 French sheath was then placed in the left subclavian vein  without difficulty and the wire was retained.  A ventricular lead model  5076, serial number [id number] V 58-cm length was then placed.  Threshold was  0.3 V at a pulse width of 0.5 msec.  R wave measured 14.7 mV with current of  0.3 mA.  10-volt test was negative.  Impedance measured 1089 ohms.  A 9  French sheath was then peeled away.  A 7 French safety sheath was placed  over the retaining wire.  An atrial lead model 5076, serial number  [id number] V 52-cm length was then placed.  Threshold was maintained at 0.9 V  at a pulse width of 0.5 msec.  P wave measured 3.4 mV with current of 1.1  mA.  10-volt test was negative.  Impedance measured 920 ohms.  Ventricular  and atrial lead were then attached to a [employer] EnRhythm model P1501DR,  serial number [id number].  This was then sewn into place.  Kanamycin  solution was used for pocket irrigation.  Wound was closed with 2-0  followed  by 4-0 Vicryl suture.  The patient tolerated procedure well.  No apparent  complications.  The patient was transferred from the cardiac catheterization  lab in stable condition.      TWK/MEDQ  D:  10/16/2004  T:  10/16/2004  Job:  [id number]   cc:   Darlin Coco, M.D.  [address] [place]  [place] [address]  Fax: [phone]

## 2011-09-14 DIAGNOSIS — N401 Enlarged prostate with lower urinary tract symptoms: Secondary | ICD-10-CM | POA: Diagnosis not present

## 2011-09-14 DIAGNOSIS — R350 Frequency of micturition: Secondary | ICD-10-CM | POA: Diagnosis not present

## 2011-10-19 DIAGNOSIS — H401290 Low-tension glaucoma, unspecified eye, stage unspecified: Secondary | ICD-10-CM | POA: Diagnosis not present

## 2011-11-01 ENCOUNTER — Other Ambulatory Visit: Payer: Self-pay | Admitting: Dermatology

## 2011-11-01 DIAGNOSIS — L570 Actinic keratosis: Secondary | ICD-10-CM | POA: Diagnosis not present

## 2011-11-01 DIAGNOSIS — C44211 Basal cell carcinoma of skin of unspecified ear and external auricular canal: Secondary | ICD-10-CM | POA: Diagnosis not present

## 2011-11-04 DIAGNOSIS — K5130 Ulcerative (chronic) rectosigmoiditis without complications: Secondary | ICD-10-CM | POA: Diagnosis not present

## 2011-11-08 DIAGNOSIS — Z950 Presence of cardiac pacemaker: Secondary | ICD-10-CM | POA: Diagnosis not present

## 2011-11-15 DIAGNOSIS — Z950 Presence of cardiac pacemaker: Secondary | ICD-10-CM | POA: Diagnosis not present

## 2011-11-15 DIAGNOSIS — I442 Atrioventricular block, complete: Secondary | ICD-10-CM | POA: Diagnosis not present

## 2011-11-15 DIAGNOSIS — I10 Essential (primary) hypertension: Secondary | ICD-10-CM | POA: Diagnosis not present

## 2011-11-15 DIAGNOSIS — I359 Nonrheumatic aortic valve disorder, unspecified: Secondary | ICD-10-CM | POA: Diagnosis not present

## 2011-11-15 DIAGNOSIS — I2510 Atherosclerotic heart disease of native coronary artery without angina pectoris: Secondary | ICD-10-CM | POA: Diagnosis not present

## 2011-11-29 DIAGNOSIS — C44221 Squamous cell carcinoma of skin of unspecified ear and external auricular canal: Secondary | ICD-10-CM | POA: Diagnosis not present

## 2011-12-06 ENCOUNTER — Encounter: Payer: Self-pay | Admitting: *Deleted

## 2012-01-26 DIAGNOSIS — E7800 Pure hypercholesterolemia, unspecified: Secondary | ICD-10-CM | POA: Diagnosis not present

## 2012-01-26 DIAGNOSIS — Z0000 Encounter for general adult medical examination without abnormal findings: Secondary | ICD-10-CM | POA: Diagnosis not present

## 2012-01-26 DIAGNOSIS — Z79899 Other long term (current) drug therapy: Secondary | ICD-10-CM | POA: Diagnosis not present

## 2012-01-26 DIAGNOSIS — R609 Edema, unspecified: Secondary | ICD-10-CM | POA: Diagnosis not present

## 2012-01-26 DIAGNOSIS — I739 Peripheral vascular disease, unspecified: Secondary | ICD-10-CM | POA: Diagnosis not present

## 2012-01-26 DIAGNOSIS — Z1331 Encounter for screening for depression: Secondary | ICD-10-CM | POA: Diagnosis not present

## 2012-01-26 DIAGNOSIS — I10 Essential (primary) hypertension: Secondary | ICD-10-CM | POA: Diagnosis not present

## 2012-02-16 DIAGNOSIS — Q150 Congenital glaucoma: Secondary | ICD-10-CM | POA: Diagnosis not present

## 2012-02-18 DIAGNOSIS — H53459 Other localized visual field defect, unspecified eye: Secondary | ICD-10-CM | POA: Diagnosis not present

## 2012-02-18 DIAGNOSIS — I739 Peripheral vascular disease, unspecified: Secondary | ICD-10-CM | POA: Diagnosis not present

## 2012-02-24 DIAGNOSIS — I10 Essential (primary) hypertension: Secondary | ICD-10-CM | POA: Diagnosis not present

## 2012-02-28 DIAGNOSIS — K5130 Ulcerative (chronic) rectosigmoiditis without complications: Secondary | ICD-10-CM | POA: Diagnosis not present

## 2012-03-06 DIAGNOSIS — Z950 Presence of cardiac pacemaker: Secondary | ICD-10-CM | POA: Diagnosis not present

## 2012-04-03 DIAGNOSIS — J069 Acute upper respiratory infection, unspecified: Secondary | ICD-10-CM | POA: Diagnosis not present

## 2012-04-03 DIAGNOSIS — I10 Essential (primary) hypertension: Secondary | ICD-10-CM | POA: Diagnosis not present

## 2012-06-12 DIAGNOSIS — Z950 Presence of cardiac pacemaker: Secondary | ICD-10-CM | POA: Diagnosis not present

## 2012-06-13 ENCOUNTER — Other Ambulatory Visit: Payer: Self-pay | Admitting: Dermatology

## 2012-06-13 DIAGNOSIS — C44211 Basal cell carcinoma of skin of unspecified ear and external auricular canal: Secondary | ICD-10-CM | POA: Diagnosis not present

## 2012-06-13 DIAGNOSIS — L570 Actinic keratosis: Secondary | ICD-10-CM | POA: Diagnosis not present

## 2012-06-13 DIAGNOSIS — L821 Other seborrheic keratosis: Secondary | ICD-10-CM | POA: Diagnosis not present

## 2012-06-13 DIAGNOSIS — L723 Sebaceous cyst: Secondary | ICD-10-CM | POA: Diagnosis not present

## 2012-06-13 DIAGNOSIS — Z85828 Personal history of other malignant neoplasm of skin: Secondary | ICD-10-CM | POA: Diagnosis not present

## 2012-06-15 DIAGNOSIS — Z23 Encounter for immunization: Secondary | ICD-10-CM | POA: Diagnosis not present

## 2012-06-27 DIAGNOSIS — H409 Unspecified glaucoma: Secondary | ICD-10-CM | POA: Diagnosis not present

## 2012-06-27 DIAGNOSIS — H401290 Low-tension glaucoma, unspecified eye, stage unspecified: Secondary | ICD-10-CM | POA: Diagnosis not present

## 2012-07-17 DIAGNOSIS — C44211 Basal cell carcinoma of skin of unspecified ear and external auricular canal: Secondary | ICD-10-CM | POA: Diagnosis not present

## 2012-09-26 DIAGNOSIS — K5130 Ulcerative (chronic) rectosigmoiditis without complications: Secondary | ICD-10-CM | POA: Diagnosis not present

## 2012-10-03 DIAGNOSIS — Z950 Presence of cardiac pacemaker: Secondary | ICD-10-CM | POA: Diagnosis not present

## 2012-10-04 DIAGNOSIS — R609 Edema, unspecified: Secondary | ICD-10-CM | POA: Diagnosis not present

## 2012-10-04 DIAGNOSIS — I10 Essential (primary) hypertension: Secondary | ICD-10-CM | POA: Diagnosis not present

## 2012-11-23 DIAGNOSIS — I359 Nonrheumatic aortic valve disorder, unspecified: Secondary | ICD-10-CM | POA: Diagnosis not present

## 2012-11-23 DIAGNOSIS — I2510 Atherosclerotic heart disease of native coronary artery without angina pectoris: Secondary | ICD-10-CM | POA: Diagnosis not present

## 2012-11-23 DIAGNOSIS — I10 Essential (primary) hypertension: Secondary | ICD-10-CM | POA: Diagnosis not present

## 2012-12-25 ENCOUNTER — Other Ambulatory Visit: Payer: Self-pay | Admitting: Gastroenterology

## 2012-12-25 DIAGNOSIS — Z09 Encounter for follow-up examination after completed treatment for conditions other than malignant neoplasm: Secondary | ICD-10-CM | POA: Diagnosis not present

## 2012-12-25 DIAGNOSIS — K6289 Other specified diseases of anus and rectum: Secondary | ICD-10-CM | POA: Diagnosis not present

## 2012-12-25 DIAGNOSIS — K5289 Other specified noninfective gastroenteritis and colitis: Secondary | ICD-10-CM | POA: Diagnosis not present

## 2012-12-25 DIAGNOSIS — K5730 Diverticulosis of large intestine without perforation or abscess without bleeding: Secondary | ICD-10-CM | POA: Diagnosis not present

## 2012-12-25 DIAGNOSIS — Z8719 Personal history of other diseases of the digestive system: Secondary | ICD-10-CM | POA: Diagnosis not present

## 2012-12-25 DIAGNOSIS — K620 Anal polyp: Secondary | ICD-10-CM | POA: Diagnosis not present

## 2012-12-25 DIAGNOSIS — K621 Rectal polyp: Secondary | ICD-10-CM | POA: Diagnosis not present

## 2012-12-25 DIAGNOSIS — K5150 Left sided colitis without complications: Secondary | ICD-10-CM | POA: Diagnosis not present

## 2012-12-26 DIAGNOSIS — H52209 Unspecified astigmatism, unspecified eye: Secondary | ICD-10-CM | POA: Diagnosis not present

## 2012-12-26 DIAGNOSIS — Z961 Presence of intraocular lens: Secondary | ICD-10-CM | POA: Diagnosis not present

## 2012-12-26 DIAGNOSIS — H40019 Open angle with borderline findings, low risk, unspecified eye: Secondary | ICD-10-CM | POA: Diagnosis not present

## 2013-01-26 DIAGNOSIS — R0989 Other specified symptoms and signs involving the circulatory and respiratory systems: Secondary | ICD-10-CM | POA: Diagnosis not present

## 2013-01-26 DIAGNOSIS — E7800 Pure hypercholesterolemia, unspecified: Secondary | ICD-10-CM | POA: Diagnosis not present

## 2013-01-26 DIAGNOSIS — I10 Essential (primary) hypertension: Secondary | ICD-10-CM | POA: Diagnosis not present

## 2013-01-26 DIAGNOSIS — Z1331 Encounter for screening for depression: Secondary | ICD-10-CM | POA: Diagnosis not present

## 2013-01-26 DIAGNOSIS — Z79899 Other long term (current) drug therapy: Secondary | ICD-10-CM | POA: Diagnosis not present

## 2013-01-26 DIAGNOSIS — I359 Nonrheumatic aortic valve disorder, unspecified: Secondary | ICD-10-CM | POA: Diagnosis not present

## 2013-01-26 DIAGNOSIS — H9190 Unspecified hearing loss, unspecified ear: Secondary | ICD-10-CM | POA: Diagnosis not present

## 2013-01-26 DIAGNOSIS — Z0000 Encounter for general adult medical examination without abnormal findings: Secondary | ICD-10-CM | POA: Diagnosis not present

## 2013-01-29 DIAGNOSIS — D239 Other benign neoplasm of skin, unspecified: Secondary | ICD-10-CM | POA: Diagnosis not present

## 2013-01-29 DIAGNOSIS — L723 Sebaceous cyst: Secondary | ICD-10-CM | POA: Diagnosis not present

## 2013-01-29 DIAGNOSIS — L570 Actinic keratosis: Secondary | ICD-10-CM | POA: Diagnosis not present

## 2013-01-29 DIAGNOSIS — L821 Other seborrheic keratosis: Secondary | ICD-10-CM | POA: Diagnosis not present

## 2013-01-29 DIAGNOSIS — Z85828 Personal history of other malignant neoplasm of skin: Secondary | ICD-10-CM | POA: Diagnosis not present

## 2013-01-29 DIAGNOSIS — D219 Benign neoplasm of connective and other soft tissue, unspecified: Secondary | ICD-10-CM | POA: Diagnosis not present

## 2013-02-09 DIAGNOSIS — H903 Sensorineural hearing loss, bilateral: Secondary | ICD-10-CM | POA: Diagnosis not present

## 2013-04-16 DIAGNOSIS — K5150 Left sided colitis without complications: Secondary | ICD-10-CM | POA: Diagnosis not present

## 2013-05-02 DIAGNOSIS — I442 Atrioventricular block, complete: Secondary | ICD-10-CM | POA: Diagnosis not present

## 2013-05-02 DIAGNOSIS — Z950 Presence of cardiac pacemaker: Secondary | ICD-10-CM | POA: Diagnosis not present

## 2013-06-21 DIAGNOSIS — Z23 Encounter for immunization: Secondary | ICD-10-CM | POA: Diagnosis not present

## 2013-07-17 DIAGNOSIS — I10 Essential (primary) hypertension: Secondary | ICD-10-CM | POA: Diagnosis not present

## 2013-07-17 DIAGNOSIS — E7800 Pure hypercholesterolemia, unspecified: Secondary | ICD-10-CM | POA: Diagnosis not present

## 2013-07-17 DIAGNOSIS — Z79899 Other long term (current) drug therapy: Secondary | ICD-10-CM | POA: Diagnosis not present

## 2013-07-31 DIAGNOSIS — L570 Actinic keratosis: Secondary | ICD-10-CM | POA: Diagnosis not present

## 2013-07-31 DIAGNOSIS — L821 Other seborrheic keratosis: Secondary | ICD-10-CM | POA: Diagnosis not present

## 2013-07-31 DIAGNOSIS — Q828 Other specified congenital malformations of skin: Secondary | ICD-10-CM | POA: Diagnosis not present

## 2013-07-31 DIAGNOSIS — Z85828 Personal history of other malignant neoplasm of skin: Secondary | ICD-10-CM | POA: Diagnosis not present

## 2013-07-31 DIAGNOSIS — L723 Sebaceous cyst: Secondary | ICD-10-CM | POA: Diagnosis not present

## 2013-08-06 ENCOUNTER — Encounter: Payer: Self-pay | Admitting: Internal Medicine

## 2013-08-06 ENCOUNTER — Ambulatory Visit (INDEPENDENT_AMBULATORY_CARE_PROVIDER_SITE_OTHER): Payer: Medicare Other | Admitting: *Deleted

## 2013-08-06 DIAGNOSIS — H4011X0 Primary open-angle glaucoma, stage unspecified: Secondary | ICD-10-CM | POA: Diagnosis not present

## 2013-08-06 DIAGNOSIS — Z950 Presence of cardiac pacemaker: Secondary | ICD-10-CM

## 2013-08-06 DIAGNOSIS — H409 Unspecified glaucoma: Secondary | ICD-10-CM | POA: Diagnosis not present

## 2013-08-06 DIAGNOSIS — Z961 Presence of intraocular lens: Secondary | ICD-10-CM | POA: Diagnosis not present

## 2013-08-06 DIAGNOSIS — I440 Atrioventricular block, first degree: Secondary | ICD-10-CM | POA: Diagnosis not present

## 2013-08-15 LAB — MDC_IDC_ENUM_SESS_TYPE_REMOTE
Brady Statistic AP VS Percent: 0 %
Brady Statistic AS VP Percent: 65 %
Brady Statistic AS VS Percent: 0 %
Lead Channel Impedance Value: 658 Ohm
Lead Channel Pacing Threshold Amplitude: 0.375 V
Lead Channel Pacing Threshold Amplitude: 0.75 V
Lead Channel Sensing Intrinsic Amplitude: 2.8 mV

## 2013-08-27 ENCOUNTER — Encounter: Payer: Self-pay | Admitting: *Deleted

## 2013-09-25 DIAGNOSIS — N401 Enlarged prostate with lower urinary tract symptoms: Secondary | ICD-10-CM | POA: Diagnosis not present

## 2013-09-25 DIAGNOSIS — N138 Other obstructive and reflux uropathy: Secondary | ICD-10-CM | POA: Diagnosis not present

## 2013-09-25 DIAGNOSIS — N139 Obstructive and reflux uropathy, unspecified: Secondary | ICD-10-CM | POA: Diagnosis not present

## 2013-10-02 DIAGNOSIS — K5130 Ulcerative (chronic) rectosigmoiditis without complications: Secondary | ICD-10-CM | POA: Diagnosis not present

## 2013-10-02 DIAGNOSIS — R195 Other fecal abnormalities: Secondary | ICD-10-CM | POA: Diagnosis not present

## 2013-11-22 ENCOUNTER — Ambulatory Visit (INDEPENDENT_AMBULATORY_CARE_PROVIDER_SITE_OTHER): Payer: Medicare Other | Admitting: Internal Medicine

## 2013-11-22 ENCOUNTER — Encounter: Payer: Self-pay | Admitting: Internal Medicine

## 2013-11-22 ENCOUNTER — Ambulatory Visit: Payer: Self-pay | Admitting: Cardiology

## 2013-11-22 VITALS — BP 191/88 | HR 80 | Ht 71.0 in | Wt 204.0 lb

## 2013-11-22 DIAGNOSIS — Z950 Presence of cardiac pacemaker: Secondary | ICD-10-CM | POA: Insufficient documentation

## 2013-11-22 DIAGNOSIS — I4891 Unspecified atrial fibrillation: Secondary | ICD-10-CM

## 2013-11-22 LAB — MDC_IDC_ENUM_SESS_TYPE_INCLINIC
Battery Impedance: 157 Ohm
Battery Remaining Longevity: 136 mo
Brady Statistic AP VP Percent: 41 %
Brady Statistic AP VS Percent: 0 %
Brady Statistic AS VS Percent: 0 %
Date Time Interrogation Session: 20150326113949
Lead Channel Impedance Value: 548 Ohm
Lead Channel Impedance Value: 660 Ohm
Lead Channel Pacing Threshold Amplitude: 0.5 V
Lead Channel Pacing Threshold Pulse Width: 0.4 ms
Lead Channel Sensing Intrinsic Amplitude: 1.4 mV
Lead Channel Setting Pacing Amplitude: 2.5 V
Lead Channel Setting Pacing Pulse Width: 0.4 ms
MDC IDC MSMT BATTERY VOLTAGE: 2.79 V
MDC IDC MSMT LEADCHNL RV PACING THRESHOLD AMPLITUDE: 0.75 V
MDC IDC MSMT LEADCHNL RV PACING THRESHOLD PULSEWIDTH: 0.4 ms
MDC IDC MSMT LEADCHNL RV SENSING INTR AMPL: 22.4 mV
MDC IDC SET LEADCHNL RA PACING AMPLITUDE: 2 V
MDC IDC SET LEADCHNL RV SENSING SENSITIVITY: 5.6 mV
MDC IDC STAT BRADY AS VP PERCENT: 58 %

## 2013-11-22 NOTE — Progress Notes (Signed)
HPI [name] is referred today for ongoing evaluation and management of his PPM. He is a pleasant elderly man with a h/o symptomatic bradycardia, s/p PPM insertion. He has HTN and dyslipidemia. He has coronary artery disease and a remote TIA. He denies palpitations. No Known Allergies   Current Outpatient Prescriptions  Medication Sig Dispense Refill  . amLODipine (NORVASC) 2.5 MG tablet Take 2.5 mg by mouth daily.      [name] aspirin EC 81 MG tablet Take 81 mg by mouth daily.      [name] atorvastatin (LIPITOR) 10 MG tablet Take 10 mg by mouth daily.      . balsalazide (COLAZAL) 750 MG capsule Take 2,250 mg by mouth 3 (three) times daily.      [name] escitalopram (LEXAPRO) 10 MG tablet Take 10 mg by mouth daily.        . furosemide (LASIX) 40 MG tablet Take 40 mg by mouth 2 (two) times daily.      [name] latanoprost (XALATAN) 0.005 % ophthalmic solution 1 drop at bedtime.      [name] losartan (COZAAR) 100 MG tablet daily.      . mesalamine (CANASA) 1000 MG suppository Use 1 suppository at bedtime 5 days a week      . metoprolol succinate (TOPROL-XL) 50 MG 24 hr tablet Take 50 mg by mouth daily. Take with or immediately following a meal.      . Multiple Vitamin (MULTIVITAMIN) tablet Take 1 tablet by mouth daily.        [name] olmesartan (BENICAR) 40 MG tablet Take 40 mg by mouth daily.      . potassium chloride (KLOR-CON) 10 MEQ CR tablet Take 10 mEq by mouth 2 (two) times daily.         No current facility-administered medications for this visit.     Past Medical History  Diagnosis Date  . Aortic valve stenosis   . Dyslipidemia   . Ulcerative colitis   . Sick sinus syndrome   . Essential hypertension   . Aortic valve disorders   . Atrioventricular block, complete   . Occlusion and stenosis of carotid artery with cerebral infarction   . Heart murmur   . TIA (transient ischemic attack)   . Ulcerative (chronic) proctitis   . Mechanical complication due to cardiac pacemaker (electrode)   .  Cardiac pacemaker in situ     3rd heart block  . Ulcerative (chronic) enterocolitis   . Intestinal infection due to Clostridium difficile 03/2010  . Anxiety state, unspecified   . Functional diarrhea   . Colitis   . Edema   . Hypercholesteremia   . Ulcerative (chronic) proctosigmoiditis   . Left sided ulcerative (chronic) colitis   . PVD (peripheral vascular disease)   . Nonspecific abnormal finding in stool contents   . Embolic stroke     right hemispheric embolic event  . Coronary atherosclerosis of native coronary artery   . Postsurgical aortocoronary bypass status     3 vessel CAD, s/p CABG 04/1998  . PAF (paroxysmal atrial fibrillation)     Hx of post op PAF  . Sigmoid diverticulosis   . Seasonal allergies   . Anemia     mild, hemoglobin 11.5  . Positive for microalbuminuria     On Benicar  . CMV (cytomegalovirus infection) 04/2010    ROS:   All systems reviewed and negative except as noted in the HPI.   Past Surgical History  Procedure Laterality Date  .  Pacemaker insertion  03/25/2010  . Transurethral resection of prostate  1998  . Coronary artery bypass graft  04/1998    3 vessel CAD  . Intraoperative arteriogram  06/2000  . Total knee arthroplasty  07/16/09  . Cardiac catheterization  1999  . Cataract extraction, bilateral  2011     Family History  Problem Relation Age of Onset  . Leukemia    . Heart failure    . Cancer    . Congestive Heart Failure Mother   . Leukemia Father      History   Social History  . Marital Status: Married    Spouse Name: N/A    Number of Children: 3  . Years of Education: N/A   Occupational History  . retired    Social History Main Topics  . Smoking status: Former Smoker -- 10 years    Types: Cigarettes    Quit date: 08/30/1952  . Smokeless tobacco: Not on file  . Alcohol Use: No  . Drug Use: Not on file  . Sexual Activity: Not on file   Other Topics Concern  . Not on file   Social History Narrative  . No  narrative on file     BP 191/88  Pulse 80  Ht 5' 11"  (1.803 m)  Wt 204 lb (92.534 kg)  BMI 28.46 kg/m2  Physical Exam:  Well appearing elderly man, looking younger than his stated age, NAD HEENT: Unremarkable Neck:  No JVD, no thyromegally Back:  No CVA tenderness Lungs:  Clear with no wheezes HEART:  Regular rate rhythm, no murmurs, no rubs, no clicks Abd:  soft, positive bowel sounds, no organomegally, no rebound, no guarding Ext:  2 plus pulses, no edema, no cyanosis, no clubbing Skin:  No rashes no nodules Neuro:  CN II through XII intact, motor grossly intact   DEVICE  Normal device function.  See PaceArt for details.   Assess/Plan:

## 2013-11-22 NOTE — Assessment & Plan Note (Signed)
He is asymptomatic and has had clear document atrial fib on PPM interogation. I have recommended he start anti-coagulation either with coumadin or a NOAC. He is reflecting on this and is scheduled to see Dr. Marlou Porch in several weeks. He wishes to hold of on anticoagulation until he speaks to Dr. Marlou Porch.

## 2013-11-22 NOTE — Patient Instructions (Addendum)
Your physician wants you to follow-up in: 12 months with Dr Knox Saliva will receive a reminder letter in the mail two months in advance. If you don't receive a letter, please call our office to schedule the follow-up appointment.    Remote monitoring is used to monitor your Pacemaker of ICD from home. This monitoring reduces the number of office visits required to check your device to one time per year. It allows [place] to keep an eye on the functioning of your device to ensure it is working properly. You are scheduled for a device check from home on 02/25/14 You may send your transmission at any time that day. If you have a wireless device, the transmission will be sent automatically. After your physician reviews your transmission, you will receive a postcard with your next transmission date.

## 2013-11-22 NOTE — Assessment & Plan Note (Signed)
His Medtronic DDD PM is working normally. Will recheck in several months.

## 2013-11-29 ENCOUNTER — Encounter: Payer: Self-pay | Admitting: Internal Medicine

## 2013-12-13 ENCOUNTER — Ambulatory Visit: Payer: Self-pay | Admitting: Cardiology

## 2014-01-04 ENCOUNTER — Encounter: Payer: Self-pay | Admitting: Cardiology

## 2014-01-04 ENCOUNTER — Encounter (INDEPENDENT_AMBULATORY_CARE_PROVIDER_SITE_OTHER): Payer: Self-pay

## 2014-01-04 ENCOUNTER — Ambulatory Visit (INDEPENDENT_AMBULATORY_CARE_PROVIDER_SITE_OTHER): Payer: Medicare Other | Admitting: Cardiology

## 2014-01-04 VITALS — BP 198/94 | HR 68 | Ht 71.0 in | Wt 202.0 lb

## 2014-01-04 DIAGNOSIS — Z950 Presence of cardiac pacemaker: Secondary | ICD-10-CM | POA: Diagnosis not present

## 2014-01-04 DIAGNOSIS — K5190 Ulcerative colitis, unspecified, without complications: Secondary | ICD-10-CM

## 2014-01-04 DIAGNOSIS — I10 Essential (primary) hypertension: Secondary | ICD-10-CM | POA: Diagnosis not present

## 2014-01-04 DIAGNOSIS — I350 Nonrheumatic aortic (valve) stenosis: Secondary | ICD-10-CM

## 2014-01-04 DIAGNOSIS — I359 Nonrheumatic aortic valve disorder, unspecified: Secondary | ICD-10-CM

## 2014-01-04 DIAGNOSIS — I4891 Unspecified atrial fibrillation: Secondary | ICD-10-CM | POA: Diagnosis not present

## 2014-01-04 NOTE — Progress Notes (Signed)
[facility]. [address] [place], [place]  [address] Phone: [phone] Fax:  [phone]  Date:  01/04/2014   ID:  Dalton Townsend, DOB 09-Jul-1923, MRN [id number]  PCP:  Default, Provider, MD   History of Present Illness: Dalton Townsend is a 80 y.o. male with pacemaker insertion on 03/23/10 for battery change out and RV lead failure here for followup. Has underlying AV block. I reviewed his last pacemaker check in December, he had a brief automatic mode switching less than 30 seconds. overall, doing well.  Three-vessel coronary artery disease status post bypass in 1999-no evidence of angina.   Prior history of postoperative atrial fibrillation-paroxysmal. No further occurrence. Not on anticoagulation. Recent pacemaker check normal.    Has ulcerative colitis, severe. Recent CMV/Cdiff   Has aortic murmur, previously followed with echocardiography-no syncope, no angina. His hypertension has been an issue. 140/88 at home. In MD office always high.  Toprol I increased to 50 mg. He does have a pacemaker for backup.      Wt Readings from Last 3 Encounters:  01/04/14 202 lb (91.627 kg)  11/22/13 204 lb (92.534 kg)  09/01/10 177 lb 4 oz (80.4 kg)     Past Medical History  Diagnosis Date  . Aortic valve stenosis   . Dyslipidemia   . Ulcerative colitis   . Sick sinus syndrome   . Essential hypertension   . Aortic valve disorders   . Atrioventricular block, complete   . Occlusion and stenosis of carotid artery with cerebral infarction   . Heart murmur   . TIA (transient ischemic attack)   . Ulcerative (chronic) proctitis   . Mechanical complication due to cardiac pacemaker (electrode)   . Cardiac pacemaker in situ     3rd heart block  . Ulcerative (chronic) enterocolitis   . Intestinal infection due to Clostridium difficile 03/2010  . Anxiety state, unspecified   . Functional diarrhea   . Colitis   . Edema   . Hypercholesteremia   . Ulcerative (chronic)  proctosigmoiditis   . Left sided ulcerative (chronic) colitis   . PVD (peripheral vascular disease)   . Nonspecific abnormal finding in stool contents   . Embolic stroke     right hemispheric embolic event  . Coronary atherosclerosis of native coronary artery   . Postsurgical aortocoronary bypass status     3 vessel CAD, s/p CABG 04/1998  . PAF (paroxysmal atrial fibrillation)     Hx of post op PAF  . Sigmoid diverticulosis   . Seasonal allergies   . Anemia     mild, hemoglobin 11.5  . Positive for microalbuminuria     On Benicar  . CMV (cytomegalovirus infection) 04/2010    Past Surgical History  Procedure Laterality Date  . Pacemaker insertion  03/25/2010  . Transurethral resection of prostate  1998  . Coronary artery bypass graft  04/1998    3 vessel CAD  . Intraoperative arteriogram  06/2000  . Total knee arthroplasty  07/16/09  . Cardiac catheterization  1999  . Cataract extraction, bilateral  2011    Current Outpatient Prescriptions  Medication Sig Dispense Refill  . amLODipine (NORVASC) 2.5 MG tablet Take 2.5 mg by mouth daily.      [name] aspirin EC 81 MG tablet Take 81 mg by mouth daily.      [name] atorvastatin (LIPITOR) 10 MG tablet Take 10 mg by mouth daily.      . balsalazide (COLAZAL) 750 MG  capsule Take 2,250 mg by mouth 3 (three) times daily.      [name] escitalopram (LEXAPRO) 10 MG tablet Take 10 mg by mouth daily.        . furosemide (LASIX) 40 MG tablet Take 40 mg by mouth 2 (two) times daily.      [name] latanoprost (XALATAN) 0.005 % ophthalmic solution 1 drop at bedtime.      [name] losartan (COZAAR) 100 MG tablet daily.      . mesalamine (CANASA) 1000 MG suppository Use 1 suppository at bedtime 5 days a week      . metoprolol succinate (TOPROL-XL) 50 MG 24 hr tablet Take 50 mg by mouth daily. Take with or immediately following a meal.      . Multiple Vitamin (MULTIVITAMIN) tablet Take 1 tablet by mouth daily.        . potassium chloride (KLOR-CON) 10 MEQ CR tablet Take 10 mEq by  mouth 2 (two) times daily.         No current facility-administered medications for this visit.    Allergies:   No Known Allergies  Social History:  The patient  reports that he quit smoking about 61 years ago. His smoking use included Cigarettes. He smoked 0.00 packs per day for 10 years. He does not have any smokeless tobacco history on file. He reports that he does not drink alcohol.   Family History  Problem Relation Age of Onset  . Leukemia    . Heart failure    . Cancer    . Congestive Heart Failure Mother   . Leukemia Father     ROS:  Please see the history of present illness.   No shortness of breath, no chest pain   All other systems reviewed and negative.   PHYSICAL EXAM: VS:  BP 198/94  Pulse 68  Ht 5' 11"  (1.803 m)  Wt 202 lb (91.627 kg)  BMI 28.19 kg/m2 Well nourished, well developed, in no acute distress HEENT: normal, [place]/AT, EOMI Neck: no JVD, normal carotid upstroke, no bruit Cardiac:  normal S1, S2; RRR; 2/6 S murmurPacemaker Lungs:  clear to auscultation bilaterally, no wheezing, rhonchi or rales Abd: soft, nontender, no hepatomegaly, no bruits Ext: no edema, 2+ distal pulses Skin: warm and dry GU: deferred Neuro: no focal abnormalities noted, AAO x 3  EKG:  01/04/14 to Ventricular paced rhythm Labs: Creatinine 0.88 - Hg 14.4 11/14  ASSESSMENT AND PLAN:  1. Paroxysmal atrial fibrillation-I agree with Dr. Lovena Le, his risk of stroke given paroxysmal episodes of atrial fibrillation detected on pacemaker interrogation should be treated with anticoagulation. I suggested Eliquis. Creatinine and hemoglobin previously checked in November were normal. His daughter is a Software engineer here at [employer] and he will discuss this with her. He has ulcerative colitis and in the past has had bleeding from the anus. He is concerned about this. Obviously bleeding is a risk. We will closely monitor him if he were on anticoagulation. 2. Pacemaker-functioning  well. 3. Hypertension-degree of whitecoat hypertension. Normally reasonably controlled at home. 4. Fatigue-I will keep his metoprolol at current dosing. Continue to encourage activity. 5. Mild aortic stenosis-previously detected on echocardiogram. 6. He will call me after discussion with his daughter. 1 year followup.  Signed, Candee Furbish, MD [facility]  01/04/2014 10:19 AM

## 2014-01-04 NOTE — Patient Instructions (Signed)
Your physician recommends that you continue on your current medications as directed. Please refer to the Current Medication list given to you today.  Your physician wants you to follow-up in: 1 year with Dr. Skains. You will receive a reminder letter in the mail two months in advance. If you don't receive a letter, please call our office to schedule the follow-up appointment.  

## 2014-01-24 ENCOUNTER — Encounter: Payer: Self-pay | Admitting: Cardiology

## 2014-02-12 DIAGNOSIS — Z961 Presence of intraocular lens: Secondary | ICD-10-CM | POA: Diagnosis not present

## 2014-02-12 DIAGNOSIS — H409 Unspecified glaucoma: Secondary | ICD-10-CM | POA: Diagnosis not present

## 2014-02-12 DIAGNOSIS — H4011X0 Primary open-angle glaucoma, stage unspecified: Secondary | ICD-10-CM | POA: Diagnosis not present

## 2014-02-12 DIAGNOSIS — H52209 Unspecified astigmatism, unspecified eye: Secondary | ICD-10-CM | POA: Diagnosis not present

## 2014-02-18 DIAGNOSIS — R609 Edema, unspecified: Secondary | ICD-10-CM | POA: Diagnosis not present

## 2014-02-18 DIAGNOSIS — E7800 Pure hypercholesterolemia, unspecified: Secondary | ICD-10-CM | POA: Diagnosis not present

## 2014-02-18 DIAGNOSIS — I10 Essential (primary) hypertension: Secondary | ICD-10-CM | POA: Diagnosis not present

## 2014-02-18 DIAGNOSIS — Z0000 Encounter for general adult medical examination without abnormal findings: Secondary | ICD-10-CM | POA: Diagnosis not present

## 2014-02-18 DIAGNOSIS — Z23 Encounter for immunization: Secondary | ICD-10-CM | POA: Diagnosis not present

## 2014-02-18 DIAGNOSIS — IMO0002 Reserved for concepts with insufficient information to code with codable children: Secondary | ICD-10-CM | POA: Diagnosis not present

## 2014-02-18 DIAGNOSIS — I359 Nonrheumatic aortic valve disorder, unspecified: Secondary | ICD-10-CM | POA: Diagnosis not present

## 2014-02-18 DIAGNOSIS — E669 Obesity, unspecified: Secondary | ICD-10-CM | POA: Diagnosis not present

## 2014-02-18 DIAGNOSIS — Z79899 Other long term (current) drug therapy: Secondary | ICD-10-CM | POA: Diagnosis not present

## 2014-02-25 ENCOUNTER — Telehealth: Payer: Self-pay | Admitting: Cardiology

## 2014-02-25 ENCOUNTER — Ambulatory Visit (INDEPENDENT_AMBULATORY_CARE_PROVIDER_SITE_OTHER): Payer: Medicare Other | Admitting: *Deleted

## 2014-02-25 DIAGNOSIS — Z950 Presence of cardiac pacemaker: Secondary | ICD-10-CM

## 2014-02-25 DIAGNOSIS — I440 Atrioventricular block, first degree: Secondary | ICD-10-CM | POA: Diagnosis not present

## 2014-02-25 NOTE — Telephone Encounter (Signed)
LMOVM reminding pt to send remote transmission.   

## 2014-02-25 NOTE — Progress Notes (Signed)
Remote pacemaker transmission.   

## 2014-02-27 LAB — MDC_IDC_ENUM_SESS_TYPE_REMOTE
Battery Impedance: 228 Ohm
Brady Statistic AP VP Percent: 54 %
Brady Statistic AS VP Percent: 46 %
Brady Statistic AS VS Percent: 0 %
Date Time Interrogation Session: 20150629150959
Lead Channel Impedance Value: 508 Ohm
Lead Channel Impedance Value: 657 Ohm
Lead Channel Pacing Threshold Amplitude: 0.625 V
Lead Channel Pacing Threshold Pulse Width: 0.4 ms
Lead Channel Sensing Intrinsic Amplitude: 1 mV
Lead Channel Setting Sensing Sensitivity: 5.6 mV
MDC IDC MSMT BATTERY REMAINING LONGEVITY: 107 mo
MDC IDC MSMT BATTERY VOLTAGE: 2.79 V
MDC IDC MSMT LEADCHNL RA PACING THRESHOLD AMPLITUDE: 0.375 V
MDC IDC MSMT LEADCHNL RV PACING THRESHOLD PULSEWIDTH: 0.4 ms
MDC IDC SET LEADCHNL RA PACING AMPLITUDE: 2 V
MDC IDC SET LEADCHNL RV PACING AMPLITUDE: 2.5 V
MDC IDC SET LEADCHNL RV PACING PULSEWIDTH: 0.4 ms
MDC IDC STAT BRADY AP VS PERCENT: 0 %

## 2014-03-06 ENCOUNTER — Encounter: Payer: Self-pay | Admitting: Cardiology

## 2014-03-12 ENCOUNTER — Encounter: Payer: Self-pay | Admitting: Internal Medicine

## 2014-05-22 ENCOUNTER — Ambulatory Visit (HOSPITAL_COMMUNITY): Payer: Medicare Other | Attending: Internal Medicine | Admitting: Radiology

## 2014-05-22 ENCOUNTER — Other Ambulatory Visit (HOSPITAL_COMMUNITY): Payer: Self-pay | Admitting: Geriatric Medicine

## 2014-05-22 DIAGNOSIS — I4891 Unspecified atrial fibrillation: Secondary | ICD-10-CM | POA: Diagnosis not present

## 2014-05-22 DIAGNOSIS — I359 Nonrheumatic aortic valve disorder, unspecified: Secondary | ICD-10-CM | POA: Diagnosis not present

## 2014-05-22 DIAGNOSIS — I2510 Atherosclerotic heart disease of native coronary artery without angina pectoris: Secondary | ICD-10-CM | POA: Diagnosis not present

## 2014-05-22 DIAGNOSIS — I10 Essential (primary) hypertension: Secondary | ICD-10-CM | POA: Diagnosis not present

## 2014-05-22 NOTE — Progress Notes (Signed)
Echocardiogram performed.  

## 2014-05-27 ENCOUNTER — Telehealth: Payer: Self-pay | Admitting: Cardiology

## 2014-05-27 ENCOUNTER — Encounter: Payer: Self-pay | Admitting: Internal Medicine

## 2014-05-27 ENCOUNTER — Ambulatory Visit (INDEPENDENT_AMBULATORY_CARE_PROVIDER_SITE_OTHER): Payer: Medicare Other | Admitting: *Deleted

## 2014-05-27 DIAGNOSIS — I440 Atrioventricular block, first degree: Secondary | ICD-10-CM | POA: Diagnosis not present

## 2014-05-27 NOTE — Telephone Encounter (Signed)
Spoke with pt and reminded pt of remote transmission that is due today. Pt verbalized understanding.   

## 2014-05-28 NOTE — Progress Notes (Signed)
Remote pacemaker transmission.   

## 2014-05-30 LAB — MDC_IDC_ENUM_SESS_TYPE_REMOTE
Battery Impedance: 204 Ohm
Battery Remaining Longevity: 112 mo
Battery Voltage: 2.79 V
Brady Statistic AP VP Percent: 51 %
Brady Statistic AS VP Percent: 49 %
Brady Statistic AS VS Percent: 0 %
Date Time Interrogation Session: 20150928223503
Lead Channel Impedance Value: 502 Ohm
Lead Channel Pacing Threshold Amplitude: 0.375 V
Lead Channel Pacing Threshold Pulse Width: 0.4 ms
Lead Channel Pacing Threshold Pulse Width: 0.4 ms
Lead Channel Sensing Intrinsic Amplitude: 1 mV
Lead Channel Setting Pacing Amplitude: 2 V
Lead Channel Setting Pacing Amplitude: 2.5 V
Lead Channel Setting Pacing Pulse Width: 0.4 ms
MDC IDC MSMT LEADCHNL RV IMPEDANCE VALUE: 712 Ohm
MDC IDC MSMT LEADCHNL RV PACING THRESHOLD AMPLITUDE: 0.625 V
MDC IDC SET LEADCHNL RV SENSING SENSITIVITY: 5.6 mV
MDC IDC STAT BRADY AP VS PERCENT: 0 %

## 2014-06-05 DIAGNOSIS — R143 Flatulence: Secondary | ICD-10-CM | POA: Diagnosis not present

## 2014-06-05 DIAGNOSIS — K5130 Ulcerative (chronic) rectosigmoiditis without complications: Secondary | ICD-10-CM | POA: Diagnosis not present

## 2014-06-14 ENCOUNTER — Encounter: Payer: Self-pay | Admitting: Cardiology

## 2014-06-18 DIAGNOSIS — Z23 Encounter for immunization: Secondary | ICD-10-CM | POA: Diagnosis not present

## 2014-08-28 ENCOUNTER — Encounter: Payer: Self-pay | Admitting: Internal Medicine

## 2014-08-28 ENCOUNTER — Ambulatory Visit (INDEPENDENT_AMBULATORY_CARE_PROVIDER_SITE_OTHER): Payer: Medicare Other | Admitting: *Deleted

## 2014-08-28 ENCOUNTER — Telehealth: Payer: Self-pay | Admitting: Cardiology

## 2014-08-28 DIAGNOSIS — Z950 Presence of cardiac pacemaker: Secondary | ICD-10-CM

## 2014-08-28 DIAGNOSIS — I440 Atrioventricular block, first degree: Secondary | ICD-10-CM

## 2014-08-28 LAB — MDC_IDC_ENUM_SESS_TYPE_REMOTE
Battery Impedance: 204 Ohm
Brady Statistic AP VS Percent: 0 %
Brady Statistic AS VP Percent: 49 %
Brady Statistic AS VS Percent: 0 %
Lead Channel Pacing Threshold Amplitude: 0.375 V
Lead Channel Pacing Threshold Pulse Width: 0.4 ms
Lead Channel Pacing Threshold Pulse Width: 0.4 ms
Lead Channel Setting Pacing Amplitude: 2 V
Lead Channel Setting Sensing Sensitivity: 5.6 mV
MDC IDC MSMT BATTERY REMAINING LONGEVITY: 112 mo
MDC IDC MSMT BATTERY VOLTAGE: 2.79 V
MDC IDC MSMT LEADCHNL RA IMPEDANCE VALUE: 540 Ohm
MDC IDC MSMT LEADCHNL RA SENSING INTR AMPL: 1 mV
MDC IDC MSMT LEADCHNL RV IMPEDANCE VALUE: 690 Ohm
MDC IDC MSMT LEADCHNL RV PACING THRESHOLD AMPLITUDE: 0.75 V
MDC IDC SESS DTM: 20151230202504
MDC IDC SET LEADCHNL RV PACING AMPLITUDE: 2.5 V
MDC IDC SET LEADCHNL RV PACING PULSEWIDTH: 0.4 ms
MDC IDC STAT BRADY AP VP PERCENT: 51 %

## 2014-08-28 NOTE — Telephone Encounter (Signed)
Spoke with pt and reminded pt of remote transmission that is due today. Pt verbalized understanding.   

## 2014-08-28 NOTE — Progress Notes (Signed)
Remote pacemaker transmission.   

## 2014-09-03 ENCOUNTER — Encounter: Payer: Self-pay | Admitting: Cardiology

## 2014-09-04 DIAGNOSIS — Z79899 Other long term (current) drug therapy: Secondary | ICD-10-CM | POA: Diagnosis not present

## 2014-09-04 DIAGNOSIS — Q253 Supravalvular aortic stenosis: Secondary | ICD-10-CM | POA: Diagnosis not present

## 2014-09-04 DIAGNOSIS — I10 Essential (primary) hypertension: Secondary | ICD-10-CM | POA: Diagnosis not present

## 2014-09-13 ENCOUNTER — Encounter: Payer: Self-pay | Admitting: Internal Medicine

## 2014-10-10 DIAGNOSIS — Z85828 Personal history of other malignant neoplasm of skin: Secondary | ICD-10-CM | POA: Diagnosis not present

## 2014-10-10 DIAGNOSIS — D2262 Melanocytic nevi of left upper limb, including shoulder: Secondary | ICD-10-CM | POA: Diagnosis not present

## 2014-10-10 DIAGNOSIS — L821 Other seborrheic keratosis: Secondary | ICD-10-CM | POA: Diagnosis not present

## 2014-10-10 DIAGNOSIS — D2261 Melanocytic nevi of right upper limb, including shoulder: Secondary | ICD-10-CM | POA: Diagnosis not present

## 2014-10-10 DIAGNOSIS — D3617 Benign neoplasm of peripheral nerves and autonomic nervous system of trunk, unspecified: Secondary | ICD-10-CM | POA: Diagnosis not present

## 2014-10-10 DIAGNOSIS — L570 Actinic keratosis: Secondary | ICD-10-CM | POA: Diagnosis not present

## 2014-10-10 DIAGNOSIS — D225 Melanocytic nevi of trunk: Secondary | ICD-10-CM | POA: Diagnosis not present

## 2014-10-22 DIAGNOSIS — R351 Nocturia: Secondary | ICD-10-CM | POA: Diagnosis not present

## 2014-10-22 DIAGNOSIS — N401 Enlarged prostate with lower urinary tract symptoms: Secondary | ICD-10-CM | POA: Diagnosis not present

## 2014-11-01 DIAGNOSIS — H4011X1 Primary open-angle glaucoma, mild stage: Secondary | ICD-10-CM | POA: Diagnosis not present

## 2014-11-01 DIAGNOSIS — Z961 Presence of intraocular lens: Secondary | ICD-10-CM | POA: Diagnosis not present

## 2014-11-01 DIAGNOSIS — H5340 Unspecified visual field defects: Secondary | ICD-10-CM | POA: Diagnosis not present

## 2014-12-02 ENCOUNTER — Encounter: Payer: Medicare Other | Admitting: Internal Medicine

## 2014-12-10 ENCOUNTER — Encounter: Payer: Medicare Other | Admitting: Internal Medicine

## 2015-01-14 ENCOUNTER — Encounter: Payer: Self-pay | Admitting: Internal Medicine

## 2015-01-14 ENCOUNTER — Ambulatory Visit (INDEPENDENT_AMBULATORY_CARE_PROVIDER_SITE_OTHER): Payer: Medicare Other | Admitting: Internal Medicine

## 2015-01-14 DIAGNOSIS — Z950 Presence of cardiac pacemaker: Secondary | ICD-10-CM

## 2015-01-14 DIAGNOSIS — I4891 Unspecified atrial fibrillation: Secondary | ICD-10-CM

## 2015-01-14 LAB — CUP PACEART INCLINIC DEVICE CHECK
Battery Impedance: 228 Ohm
Brady Statistic AP VP Percent: 50 %
Brady Statistic AP VS Percent: 0 %
Lead Channel Impedance Value: 541 Ohm
Lead Channel Impedance Value: 683 Ohm
Lead Channel Pacing Threshold Amplitude: 0.5 V
Lead Channel Pacing Threshold Pulse Width: 0.4 ms
Lead Channel Pacing Threshold Pulse Width: 0.4 ms
Lead Channel Sensing Intrinsic Amplitude: 22.4 mV
Lead Channel Setting Pacing Amplitude: 2 V
Lead Channel Setting Pacing Pulse Width: 0.4 ms
Lead Channel Setting Sensing Sensitivity: 5.6 mV
MDC IDC MSMT BATTERY REMAINING LONGEVITY: 109 mo
MDC IDC MSMT BATTERY VOLTAGE: 2.79 V
MDC IDC MSMT LEADCHNL RA SENSING INTR AMPL: 1 mV
MDC IDC MSMT LEADCHNL RV PACING THRESHOLD AMPLITUDE: 0.75 V
MDC IDC SESS DTM: 20160517124858
MDC IDC SET LEADCHNL RV PACING AMPLITUDE: 2.5 V
MDC IDC STAT BRADY AS VP PERCENT: 50 %
MDC IDC STAT BRADY AS VS PERCENT: 0 %

## 2015-01-14 MED ORDER — AMLODIPINE BESYLATE 5 MG PO TABS
5.0000 mg | ORAL_TABLET | Freq: Every day | ORAL | Status: DC
Start: 2015-01-14 — End: 2015-04-28

## 2015-01-14 NOTE — Assessment & Plan Note (Signed)
He appears to be asymptomatic at this point. Will follow.

## 2015-01-14 NOTE — Assessment & Plan Note (Signed)
His medtronic PPM is working normally. Will recheck in several months.

## 2015-01-14 NOTE — Assessment & Plan Note (Signed)
He is maintaining NSR nicely. He will continue is current meds.

## 2015-01-14 NOTE — Assessment & Plan Note (Signed)
His blood pressure is very high today. I have rechecked and it is down. He is on a very low dose of amlodipine and I have asked him to increase his norvasc to 5 mg daily and followup with Dr. Felipa Eth.

## 2015-01-14 NOTE — Progress Notes (Signed)
HPI Dalton Townsend is referred today for ongoing evaluation and management of his PPM. He is a pleasant elderly man with a h/o symptomatic bradycardia, s/p PPM insertion. He has HTN and dyslipidemia. He has coronary artery disease and a remote TIA. He denies palpitations. He denies medical non-compliance. His blood pressure is high but he is asymptomatic. No Known Allergies   Current Outpatient Prescriptions  Medication Sig Dispense Refill  . amLODipine (NORVASC) 2.5 MG tablet Take 2.5 mg by mouth daily.    [name] aspirin EC 81 MG tablet Take 81 mg by mouth daily.    [name] atorvastatin (LIPITOR) 10 MG tablet Take 10 mg by mouth daily.    . balsalazide (COLAZAL) 750 MG capsule Take 2,250 mg by mouth 3 (three) times daily.    [name] escitalopram (LEXAPRO) 10 MG tablet Take 10 mg by mouth daily.      . finasteride (PROSCAR) 5 MG tablet Take 5 mg by mouth daily.     . furosemide (LASIX) 40 MG tablet Take 40 mg by mouth 2 (two) times daily.    [name] latanoprost (XALATAN) 0.005 % ophthalmic solution 1 drop at bedtime.    [name] losartan (COZAAR) 100 MG tablet daily.    . mesalamine (CANASA) 1000 MG suppository Use 1 suppository at bedtime 5 days a week    . metoprolol succinate (TOPROL-XL) 50 MG 24 hr tablet Take 50 mg by mouth daily. Take with or immediately following a meal.    . Multiple Vitamin (MULTIVITAMIN) tablet Take 1 tablet by mouth daily.      . potassium chloride (KLOR-CON) 10 MEQ CR tablet Take 10 mEq by mouth 2 (two) times daily.       No current facility-administered medications for this visit.     Past Medical History  Diagnosis Date  . Aortic valve stenosis   . Dyslipidemia   . Ulcerative colitis   . Sick sinus syndrome   . Essential hypertension   . Aortic valve disorders   . Atrioventricular block, complete   . Occlusion and stenosis of carotid artery with cerebral infarction   . Heart murmur   . TIA (transient ischemic attack)   . Ulcerative (chronic) proctitis   . Mechanical  complication due to cardiac pacemaker (electrode)   . Cardiac pacemaker in situ     3rd heart block  . Ulcerative (chronic) enterocolitis   . Intestinal infection due to Clostridium difficile 03/2010  . Anxiety state, unspecified   . Functional diarrhea   . Colitis   . Edema   . Hypercholesteremia   . Ulcerative (chronic) proctosigmoiditis   . Left sided ulcerative (chronic) colitis   . PVD (peripheral vascular disease)   . Nonspecific abnormal finding in stool contents   . Embolic stroke     right hemispheric embolic event  . Coronary atherosclerosis of native coronary artery   . Postsurgical aortocoronary bypass status     3 vessel CAD, s/p CABG 04/1998  . PAF (paroxysmal atrial fibrillation)     Hx of post op PAF  . Sigmoid diverticulosis   . Seasonal allergies   . Anemia     mild, hemoglobin 11.5  . Positive for microalbuminuria     On Benicar  . CMV (cytomegalovirus infection) 04/2010    ROS:   All systems reviewed and negative except as noted in the HPI.   Past Surgical History  Procedure Laterality Date  . Pacemaker insertion  03/25/2010  . Transurethral resection of prostate  1998  . Coronary artery bypass graft  04/1998    3 vessel CAD  . Intraoperative arteriogram  06/2000  . Total knee arthroplasty  07/16/09  . Cardiac catheterization  1999  . Cataract extraction, bilateral  2011     Family History  Problem Relation Age of Onset  . Leukemia    . Heart failure    . Cancer    . Congestive Heart Failure Mother   . Leukemia Father      History   Social History  . Marital Status: Married    Spouse Name: N/A  . Number of Children: 3  . Years of Education: N/A   Occupational History  . retired    Social History Main Topics  . Smoking status: Former Smoker -- 10 years    Types: Cigarettes    Quit date: 08/30/1952  . Smokeless tobacco: Not on file  . Alcohol Use: No  . Drug Use: Not on file  . Sexual Activity: Not on file   Other Topics  Concern  . Not on file   Social History Narrative     BP 210/104 mmHg  Pulse 73  Ht 5' 11"  (1.803 m)  Wt 198 lb (89.812 kg)  BMI 27.63 kg/m2 BP 170/95 by me Physical Exam:  Well appearing elderly man, looking younger than his stated age, NAD HEENT: Unremarkable Neck:  No JVD, no thyromegally Back:  No CVA tenderness Lungs:  Clear with no wheezes HEART:  Regular rate rhythm, no murmurs, no rubs, no clicks Abd:  soft, positive bowel sounds, no organomegally, no rebound, no guarding Ext:  2 plus pulses, no edema, no cyanosis, no clubbing Skin:  No rashes no nodules Neuro:  CN II through XII intact, motor grossly intact   DEVICE  Normal device function.  See PaceArt for details.   Assess/Plan:

## 2015-01-14 NOTE — Patient Instructions (Addendum)
Medication Instructions:  Your physician has recommended you make the following change in your medication:  1) Increase Amlodipine to 5 mg daily   Labwork: None ordered  Testing/Procedures: None ordered  Follow-Up: Your physician wants you to follow-up in: 12 months with Dr Knox Saliva will receive a reminder letter in the mail two months in advance. If you don't receive a letter, please call our office to schedule the follow-up appointment.  Remote monitoring is used to monitor your Pacemaker or ICD from home. This monitoring reduces the number of office visits required to check your device to one time per year. It allows [place] to keep an eye on the functioning of your device to ensure it is working properly. You are scheduled for a device check from home on 04/15/15. You may send your transmission at any time that day. If you have a wireless device, the transmission will be sent automatically. After your physician reviews your transmission, you will receive a postcard with your next transmission date.     Any Other Special Instructions Will Be Listed Below (If Applicable).

## 2015-01-15 ENCOUNTER — Ambulatory Visit: Payer: Medicare Other | Admitting: Cardiology

## 2015-01-22 DIAGNOSIS — K51219 Ulcerative (chronic) proctitis with unspecified complications: Secondary | ICD-10-CM | POA: Diagnosis not present

## 2015-03-05 DIAGNOSIS — D692 Other nonthrombocytopenic purpura: Secondary | ICD-10-CM | POA: Diagnosis not present

## 2015-03-05 DIAGNOSIS — Z0000 Encounter for general adult medical examination without abnormal findings: Secondary | ICD-10-CM | POA: Diagnosis not present

## 2015-03-05 DIAGNOSIS — Z79899 Other long term (current) drug therapy: Secondary | ICD-10-CM | POA: Diagnosis not present

## 2015-03-05 DIAGNOSIS — E780 Pure hypercholesterolemia: Secondary | ICD-10-CM | POA: Diagnosis not present

## 2015-03-05 DIAGNOSIS — Q253 Supravalvular aortic stenosis: Secondary | ICD-10-CM | POA: Diagnosis not present

## 2015-03-05 DIAGNOSIS — Z1389 Encounter for screening for other disorder: Secondary | ICD-10-CM | POA: Diagnosis not present

## 2015-03-05 DIAGNOSIS — F325 Major depressive disorder, single episode, in full remission: Secondary | ICD-10-CM | POA: Diagnosis not present

## 2015-03-05 DIAGNOSIS — I10 Essential (primary) hypertension: Secondary | ICD-10-CM | POA: Diagnosis not present

## 2015-04-15 ENCOUNTER — Encounter: Payer: Self-pay | Admitting: Internal Medicine

## 2015-04-15 ENCOUNTER — Ambulatory Visit (INDEPENDENT_AMBULATORY_CARE_PROVIDER_SITE_OTHER): Payer: Medicare Other | Admitting: *Deleted

## 2015-04-15 DIAGNOSIS — I440 Atrioventricular block, first degree: Secondary | ICD-10-CM

## 2015-04-15 NOTE — Progress Notes (Signed)
Remote pacemaker transmission.   

## 2015-04-22 LAB — CUP PACEART REMOTE DEVICE CHECK
Battery Remaining Longevity: 106 mo
Brady Statistic AP VP Percent: 55 %
Brady Statistic AP VS Percent: 0 %
Brady Statistic AS VS Percent: 0 %
Date Time Interrogation Session: 20160816123553
Lead Channel Impedance Value: 690 Ohm
Lead Channel Pacing Threshold Amplitude: 0.75 V
Lead Channel Pacing Threshold Pulse Width: 0.4 ms
Lead Channel Pacing Threshold Pulse Width: 0.4 ms
Lead Channel Setting Pacing Amplitude: 2 V
Lead Channel Setting Pacing Amplitude: 2.5 V
Lead Channel Setting Pacing Pulse Width: 0.4 ms
MDC IDC MSMT BATTERY IMPEDANCE: 252 Ohm
MDC IDC MSMT BATTERY VOLTAGE: 2.79 V
MDC IDC MSMT LEADCHNL RA IMPEDANCE VALUE: 584 Ohm
MDC IDC MSMT LEADCHNL RA PACING THRESHOLD AMPLITUDE: 0.375 V
MDC IDC MSMT LEADCHNL RA SENSING INTR AMPL: 1 mV
MDC IDC SET LEADCHNL RV SENSING SENSITIVITY: 5.6 mV
MDC IDC STAT BRADY AS VP PERCENT: 45 %

## 2015-04-28 ENCOUNTER — Other Ambulatory Visit: Payer: Self-pay | Admitting: *Deleted

## 2015-04-28 MED ORDER — AMLODIPINE BESYLATE 5 MG PO TABS: 5.0000 mg | ORAL_TABLET | Freq: Every day | ORAL | Status: DC

## 2015-05-14 ENCOUNTER — Encounter: Payer: Self-pay | Admitting: Cardiology

## 2015-05-26 DIAGNOSIS — H4011X1 Primary open-angle glaucoma, mild stage: Secondary | ICD-10-CM | POA: Diagnosis not present

## 2015-05-26 DIAGNOSIS — Z961 Presence of intraocular lens: Secondary | ICD-10-CM | POA: Diagnosis not present

## 2015-05-26 DIAGNOSIS — Z0100 Encounter for examination of eyes and vision without abnormal findings: Secondary | ICD-10-CM | POA: Diagnosis not present

## 2015-06-24 DIAGNOSIS — Z23 Encounter for immunization: Secondary | ICD-10-CM | POA: Diagnosis not present

## 2015-07-07 DIAGNOSIS — K51219 Ulcerative (chronic) proctitis with unspecified complications: Secondary | ICD-10-CM | POA: Diagnosis not present

## 2015-07-17 ENCOUNTER — Ambulatory Visit (INDEPENDENT_AMBULATORY_CARE_PROVIDER_SITE_OTHER): Payer: Medicare Other | Admitting: *Deleted

## 2015-07-17 DIAGNOSIS — I440 Atrioventricular block, first degree: Secondary | ICD-10-CM

## 2015-07-17 NOTE — Progress Notes (Signed)
Remote pacemaker transmission.   

## 2015-07-29 LAB — CUP PACEART REMOTE DEVICE CHECK
Battery Impedance: 300 Ohm
Battery Remaining Longevity: 98 mo
Brady Statistic AP VP Percent: 60 %
Brady Statistic AP VS Percent: 0 %
Brady Statistic AS VP Percent: 40 %
Brady Statistic AS VS Percent: 0 %
Implantable Lead Implant Date: 20110725
Implantable Lead Location: 753860
Lead Channel Impedance Value: 532 Ohm
Lead Channel Impedance Value: 671 Ohm
Lead Channel Pacing Threshold Amplitude: 0.375 V
Lead Channel Pacing Threshold Amplitude: 0.75 V
Lead Channel Pacing Threshold Pulse Width: 0.4 ms
Lead Channel Pacing Threshold Pulse Width: 0.4 ms
Lead Channel Sensing Intrinsic Amplitude: 0.7 mV
Lead Channel Setting Pacing Pulse Width: 0.4 ms
MDC IDC LEAD IMPLANT DT: 20110725
MDC IDC LEAD LOCATION: 753859
MDC IDC MSMT BATTERY VOLTAGE: 2.79 V
MDC IDC SESS DTM: 20161117143722
MDC IDC SET LEADCHNL RA PACING AMPLITUDE: 2 V
MDC IDC SET LEADCHNL RV PACING AMPLITUDE: 2.5 V
MDC IDC SET LEADCHNL RV SENSING SENSITIVITY: 5.6 mV

## 2015-07-31 ENCOUNTER — Encounter: Payer: Self-pay | Admitting: Cardiology

## 2015-07-31 ENCOUNTER — Ambulatory Visit (INDEPENDENT_AMBULATORY_CARE_PROVIDER_SITE_OTHER): Payer: Medicare Other | Admitting: Cardiology

## 2015-07-31 VITALS — BP 150/94 | HR 80 | Ht 71.0 in | Wt 186.1 lb

## 2015-07-31 DIAGNOSIS — I10 Essential (primary) hypertension: Secondary | ICD-10-CM | POA: Diagnosis not present

## 2015-07-31 DIAGNOSIS — I480 Paroxysmal atrial fibrillation: Secondary | ICD-10-CM | POA: Diagnosis not present

## 2015-07-31 DIAGNOSIS — Z950 Presence of cardiac pacemaker: Secondary | ICD-10-CM | POA: Diagnosis not present

## 2015-07-31 DIAGNOSIS — I350 Nonrheumatic aortic (valve) stenosis: Secondary | ICD-10-CM

## 2015-07-31 NOTE — Progress Notes (Signed)
[facility] [address], [address] [place], [place]  [address] Phone: [phone] Fax:  [phone]  Date:  07/31/2015   ID:  ERNIE KASLER, DOB 1923/04/10, MRN [id number]  PCP:  Mathews Argyle, MD   History of Present Illness: Dalton Townsend is a 81 y.o. male with pacemaker insertion on 03/23/10 for battery change out and RV lead failure here for followup. Has underlying AV block. I reviewed his pacemaker check in December, he had a brief automatic mode switching less than 30 seconds. overall, doing well.  Three-vessel coronary artery disease status post bypass in 1999-no evidence of angina.   Prior history of postoperative atrial fibrillation-paroxysmal. No further occurrence. Not on anticoagulation and would like to contemplate this; not yet ready to start treatment . Recent pacemaker check normal.   Has ulcerative colitis, severe.  Followed by Dr. Cristina Gong.  Has aortic murmur, previously followed with echocardiography-no syncope, no angina, dyspnea. His hypertension has been an issue in the past. He reports 130s/90s at home.  Reports 122/84 at Dr. Osborn Coho office two months ago.  He goes to gym almost daily - fitness training, aerobic exercises.  Wt Readings from Last 3 Encounters:  07/31/15 186 lb 1.9 oz (84.423 kg)  01/14/15 198 lb (89.812 kg)  01/04/14 202 lb (91.627 kg)     Past Medical History  Diagnosis Date  . Aortic valve stenosis   . Dyslipidemia   . Ulcerative colitis   . Sick sinus syndrome ([place])   . Essential hypertension   . Aortic valve disorders   . Atrioventricular block, complete ([place])   . Occlusion and stenosis of carotid artery with cerebral infarction   . Heart murmur   . TIA (transient ischemic attack)   . 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 Mechanical complication due to cardiac pacemaker (electrode)   . Cardiac pacemaker in situ     3rd heart block  . Ulcerative (chronic) enterocolitis ([place])   . Intestinal infection due to  Clostridium difficile 03/2010  . Anxiety state, unspecified   . Functional diarrhea   . Colitis   . Edema   . Hypercholesteremia   . 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 Left sided ulcerative (chronic) colitis ([place])   . PVD (peripheral vascular disease) ([place])   . Nonspecific abnormal finding in stool contents   . Embolic stroke ([place])     right hemispheric embolic event  . Coronary atherosclerosis of native coronary artery   . Postsurgical aortocoronary bypass status     3 vessel CAD, s/p CABG 04/1998  . PAF (paroxysmal atrial fibrillation) (H[facility])     Hx of post op PAF  . Sigmoid diverticulosis   . Seasonal allergies   . Anemia     mild, hemoglobin 11.5  . Positive for microalbuminuria     On Benicar  . CMV (cytomegalovirus infection) ([place]) 04/2010    Past Surgical History  Procedure Laterality Date  . Pacemaker insertion  03/25/2010  . Transurethral resection of prostate  1998  . Coronary artery bypass graft  04/1998    3 vessel CAD  . Intraoperative arteriogram  06/2000  . Total knee arthroplasty  07/16/09  . Cardiac catheterization  1999  . Cataract extraction, bilateral  2011    Current Outpatient Prescriptions  Medication Sig Dispense Refill  . amLODipine (NORVASC) 5 MG tablet Take 1 tablet (5 mg total) by mouth daily. 90 tablet 2  . aspirin EC 81 MG tablet Take 81 mg  by mouth daily.    [name] atorvastatin (LIPITOR) 10 MG tablet Take 10 mg by mouth daily.    . balsalazide (COLAZAL) 750 MG capsule Take 2,250 mg by mouth 3 (three) times daily.    [name] escitalopram (LEXAPRO) 10 MG tablet Take 10 mg by mouth daily.      . finasteride (PROSCAR) 5 MG tablet Take 5 mg by mouth daily.     . furosemide (LASIX) 40 MG tablet Take 40 mg by mouth 2 (two) times daily.    [name] latanoprost (XALATAN) 0.005 % ophthalmic solution 1 drop at bedtime.    [name] losartan (COZAAR) 100 MG tablet daily.    . mesalamine (CANASA) 1000 MG suppository Use 1 suppository at bedtime 5 days a week    .  metoprolol succinate (TOPROL-XL) 50 MG 24 hr tablet Take 50 mg by mouth daily. Take with or immediately following a meal.    . Multiple Vitamin (MULTIVITAMIN) tablet Take 1 tablet by mouth daily.      . potassium chloride (KLOR-CON) 10 MEQ CR tablet Take 10 mEq by mouth 2 (two) times daily.       No current facility-administered medications for this visit.    Allergies:   No Known Allergies  Social History:  The patient  reports that he quit smoking about 62 years ago. His smoking use included Cigarettes. He quit after 10 years of use. He does not have any smokeless tobacco history on file. He reports that he does not drink alcohol.   Family History  Problem Relation Age of Onset  . Leukemia    . Heart failure    . Cancer    . Congestive Heart Failure Mother   . Leukemia Father     ROS:  Please see the history of present illness.   No shortness of breath, no chest pain, syncope.  Occasional blood in stool.  + LE edema.  All other systems reviewed and negative.   PHYSICAL EXAM: VS:  BP 150/94 mmHg  Pulse 80  Ht 5' 11"  (1.803 m)  Wt 186 lb 1.9 oz (84.423 kg)  BMI 25.97 kg/m2  SpO2 96% Well nourished, well developed, in no acute distress HEENT: normal, [place]/AT Neck: no JVD, normal carotid upstroke, no bruit Cardiac:  normal S1, S2; RRR; 2/6 S murmur Lungs:  clear to auscultation bilaterally, no wheezing, rhonchi or rales Abd: soft, nontender, no hepatomegaly Ext: trace LE edema, 2+ distal pulses Skin: warm and dry GU: deferred Neuro: no focal abnormalities noted, AAO x 3  EKG:  01/04/14 to Ventricular paced rhythm Labs: Creatinine 0.88 - Hg 14.4 11/14  ASSESSMENT AND PLAN:  1. Paroxysmal atrial fibrillation-I agree with Dr. Lovena Le, his risk of stroke given paroxysmal episodes of atrial fibrillation detected on pacemaker interrogation should be treated with anticoagulation. I suggested Eliquis. Creatinine and hemoglobin previously checked in November were normal. His daughter is  a Software engineer here at [facility] and he will discuss this with her. He has ulcerative colitis and in the past has had bleeding from the anus. He is concerned about this. Obviously bleeding is a risk. We will closely monitor him if he were on anticoagulation. He admits that he would like to think about this. 2. Pacemaker-functioning well. 3. Hypertension-degree of whitecoat hypertension. Normally reasonably controlled at home. 4. Fatigue- no fatigue today.  I will keep his metoprolol at current dosing. Continue to encourage activity. 5. Mild aortic stenosis-previously detected on echocardiogram. 6. 1 year followup.  Signed, Candee Furbish, MD [facility]  07/31/2015 11:25 AM

## 2015-07-31 NOTE — Patient Instructions (Signed)

## 2015-08-01 ENCOUNTER — Encounter: Payer: Self-pay | Admitting: Cardiology

## 2015-08-01 ENCOUNTER — Telehealth: Payer: Self-pay | Admitting: Cardiology

## 2015-08-01 NOTE — Telephone Encounter (Signed)
error 

## 2015-09-09 DIAGNOSIS — Q253 Supravalvular aortic stenosis: Secondary | ICD-10-CM | POA: Diagnosis not present

## 2015-09-09 DIAGNOSIS — I10 Essential (primary) hypertension: Secondary | ICD-10-CM | POA: Diagnosis not present

## 2015-09-09 DIAGNOSIS — I480 Paroxysmal atrial fibrillation: Secondary | ICD-10-CM | POA: Diagnosis not present

## 2015-10-16 ENCOUNTER — Ambulatory Visit (INDEPENDENT_AMBULATORY_CARE_PROVIDER_SITE_OTHER): Payer: Medicare Other | Admitting: *Deleted

## 2015-10-16 DIAGNOSIS — I440 Atrioventricular block, first degree: Secondary | ICD-10-CM | POA: Diagnosis not present

## 2015-10-17 NOTE — Progress Notes (Signed)
Remote pacemaker transmission.   

## 2015-10-31 DIAGNOSIS — R351 Nocturia: Secondary | ICD-10-CM | POA: Diagnosis not present

## 2015-10-31 DIAGNOSIS — N138 Other obstructive and reflux uropathy: Secondary | ICD-10-CM | POA: Diagnosis not present

## 2015-10-31 DIAGNOSIS — N401 Enlarged prostate with lower urinary tract symptoms: Secondary | ICD-10-CM | POA: Diagnosis not present

## 2015-10-31 DIAGNOSIS — Z0000 Encounter for general adult medical examination without abnormal findings: Secondary | ICD-10-CM | POA: Diagnosis not present

## 2015-11-02 LAB — CUP PACEART REMOTE DEVICE CHECK
Battery Remaining Longevity: 95 mo
Battery Voltage: 2.79 V
Brady Statistic AP VP Percent: 60 %
Brady Statistic AP VS Percent: 0 %
Brady Statistic AS VP Percent: 40 %
Brady Statistic AS VS Percent: 0 %
Implantable Lead Implant Date: 20110725
Implantable Lead Implant Date: 20110725
Implantable Lead Model: 5076
Implantable Lead Model: 5076
Lead Channel Setting Pacing Amplitude: 2 V
Lead Channel Setting Pacing Amplitude: 2.5 V
Lead Channel Setting Pacing Pulse Width: 0.4 ms
Lead Channel Setting Sensing Sensitivity: 5.6 mV
MDC IDC LEAD LOCATION: 753859
MDC IDC LEAD LOCATION: 753860
MDC IDC MSMT BATTERY IMPEDANCE: 324 Ohm
MDC IDC MSMT LEADCHNL RA IMPEDANCE VALUE: 501 Ohm
MDC IDC MSMT LEADCHNL RV IMPEDANCE VALUE: 621 Ohm
MDC IDC SESS DTM: 20170217000839

## 2015-11-02 NOTE — Progress Notes (Signed)
Normal remote reviewed. 0.9% AF, Dr Marlou Porch discussed [facility] with patient at last ov - he wished to discuss with family.  Next follow up 12/2015 in clinic.

## 2015-11-12 ENCOUNTER — Encounter: Payer: Self-pay | Admitting: Cardiology

## 2015-11-17 DIAGNOSIS — D1801 Hemangioma of skin and subcutaneous tissue: Secondary | ICD-10-CM | POA: Diagnosis not present

## 2015-11-17 DIAGNOSIS — D2272 Melanocytic nevi of left lower limb, including hip: Secondary | ICD-10-CM | POA: Diagnosis not present

## 2015-11-17 DIAGNOSIS — Z85828 Personal history of other malignant neoplasm of skin: Secondary | ICD-10-CM | POA: Diagnosis not present

## 2015-11-17 DIAGNOSIS — L821 Other seborrheic keratosis: Secondary | ICD-10-CM | POA: Diagnosis not present

## 2015-11-17 DIAGNOSIS — D692 Other nonthrombocytopenic purpura: Secondary | ICD-10-CM | POA: Diagnosis not present

## 2015-11-17 DIAGNOSIS — L570 Actinic keratosis: Secondary | ICD-10-CM | POA: Diagnosis not present

## 2015-11-17 DIAGNOSIS — D485 Neoplasm of uncertain behavior of skin: Secondary | ICD-10-CM | POA: Diagnosis not present

## 2015-11-26 DIAGNOSIS — H5340 Unspecified visual field defects: Secondary | ICD-10-CM | POA: Diagnosis not present

## 2015-11-26 DIAGNOSIS — H401122 Primary open-angle glaucoma, left eye, moderate stage: Secondary | ICD-10-CM | POA: Diagnosis not present

## 2015-11-26 DIAGNOSIS — H401111 Primary open-angle glaucoma, right eye, mild stage: Secondary | ICD-10-CM | POA: Diagnosis not present

## 2015-12-28 DIAGNOSIS — L988 Other specified disorders of the skin and subcutaneous tissue: Secondary | ICD-10-CM | POA: Diagnosis not present

## 2015-12-28 DIAGNOSIS — S81802A Unspecified open wound, left lower leg, initial encounter: Secondary | ICD-10-CM | POA: Diagnosis not present

## 2015-12-29 DIAGNOSIS — S8012XA Contusion of left lower leg, initial encounter: Secondary | ICD-10-CM | POA: Diagnosis not present

## 2016-01-20 DIAGNOSIS — R143 Flatulence: Secondary | ICD-10-CM | POA: Diagnosis not present

## 2016-01-20 DIAGNOSIS — K51219 Ulcerative (chronic) proctitis with unspecified complications: Secondary | ICD-10-CM | POA: Diagnosis not present

## 2016-01-21 ENCOUNTER — Encounter: Payer: Self-pay | Admitting: Internal Medicine

## 2016-01-21 ENCOUNTER — Telehealth: Payer: Self-pay

## 2016-01-21 ENCOUNTER — Ambulatory Visit (INDEPENDENT_AMBULATORY_CARE_PROVIDER_SITE_OTHER): Payer: Medicare Other | Admitting: Internal Medicine

## 2016-01-21 VITALS — BP 126/82 | HR 89 | Ht 71.0 in | Wt 193.8 lb

## 2016-01-21 DIAGNOSIS — Z950 Presence of cardiac pacemaker: Secondary | ICD-10-CM

## 2016-01-21 DIAGNOSIS — I480 Paroxysmal atrial fibrillation: Secondary | ICD-10-CM | POA: Diagnosis not present

## 2016-01-21 MED ORDER — APIXABAN 5 MG PO TABS: 5.0000 mg | ORAL_TABLET | Freq: Two times a day (BID) | ORAL | Status: DC

## 2016-01-21 NOTE — Progress Notes (Signed)
HPI Dalton Townsend is referred today for ongoing evaluation and management of his PPM. He is a pleasant elderly man with a h/o symptomatic bradycardia, s/p PPM insertion. He has HTN and dyslipidemia. He has coronary artery disease and a remote TIA. He denies palpitations. He denies medical non-compliance. His blood pressure is high but he is asymptomatic.He has PAF which has been demonstrated on his PPM. He has been reluctant to take any anti-coagulation other than ASA due to his UC. No active UC flairs in nearly a year and 4 years before that.  No Known Allergies   Current Outpatient Prescriptions  Medication Sig Dispense Refill  . amLODipine (NORVASC) 5 MG tablet Take 1 tablet (5 mg total) by mouth daily. 90 tablet 2  . aspirin EC 81 MG tablet Take 81 mg by mouth daily.    [name] atorvastatin (LIPITOR) 20 MG tablet Take 20 mg by mouth daily.    . balsalazide (COLAZAL) 750 MG capsule Take 2,250 mg by mouth 3 (three) times daily.    [name] escitalopram (LEXAPRO) 10 MG tablet Take 10 mg by mouth daily.      . finasteride (PROSCAR) 5 MG tablet Take 5 mg by mouth daily.     . furosemide (LASIX) 40 MG tablet Take 40 mg by mouth 2 (two) times daily.    [name] latanoprost (XALATAN) 0.005 % ophthalmic solution 1 drop at bedtime.    [name] losartan (COZAAR) 100 MG tablet daily.    . mesalamine (CANASA) 1000 MG suppository Use 1 suppository at bedtime 5 days a week    . metoprolol succinate (TOPROL-XL) 50 MG 24 hr tablet Take 50 mg by mouth daily. Take with or immediately following a meal.    . Multiple Vitamin (MULTIVITAMIN) tablet Take 1 tablet by mouth daily.      . potassium chloride (KLOR-CON) 10 MEQ CR tablet Take 10 mEq by mouth 2 (two) times daily.       No current facility-administered medications for this visit.     Past Medical History  Diagnosis Date  . Aortic valve stenosis   . Dyslipidemia   . Ulcerative colitis   . Sick sinus syndrome ([place])   . Essential hypertension   . Aortic valve  disorders   . Atrioventricular block, complete ([place])   . Occlusion and stenosis of carotid artery with cerebral infarction   . Heart murmur   . TIA (transient ischemic attack)   . 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 Mechanical complication due to cardiac pacemaker (electrode)   . Cardiac pacemaker in situ     3rd heart block  . Ulcerative (chronic) enterocolitis ([place])   . Intestinal infection due to Clostridium difficile 03/2010  . Anxiety state, unspecified   . Functional diarrhea   . Colitis   . Edema   . Hypercholesteremia   . 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 Left sided ulcerative (chronic) colitis ([place])   . PVD (peripheral vascular disease) ([place])   . Nonspecific abnormal finding in stool contents   . Embolic stroke ([place])     right hemispheric embolic event  . Coronary atherosclerosis of native coronary artery   . Postsurgical aortocoronary bypass status     3 vessel CAD, s/p CABG 04/1998  . PAF (paroxysmal atrial fibrillation) (H[facility])     Hx of post op PAF  . Sigmoid diverticulosis   . Seasonal allergies   . Anemia     mild, hemoglobin 11.5  . Positive for  microalbuminuria     On Benicar  . CMV (cytomegalovirus infection) ([place]) 04/2010    ROS:   All systems reviewed and negative except as noted in the HPI.   Past Surgical History  Procedure Laterality Date  . Pacemaker insertion  03/25/2010  . Transurethral resection of prostate  1998  . Coronary artery bypass graft  04/1998    3 vessel CAD  . Intraoperative arteriogram  06/2000  . Total knee arthroplasty  07/16/09  . Cardiac catheterization  1999  . Cataract extraction, bilateral  2011     Family History  Problem Relation Age of Onset  . Leukemia    . Heart failure    . Cancer    . Congestive Heart Failure Mother   . Leukemia Father      Social History   Social History  . Marital Status: Married    Spouse Name: N/A  . Number of Children: 3  . Years of Education: N/A    Occupational History  . retired    Social History Main Topics  . Smoking status: Former Smoker -- 10 years    Types: Cigarettes    Quit date: 08/30/1952  . Smokeless tobacco: Not on file  . Alcohol Use: No  . Drug Use: Not on file  . Sexual Activity: Not on file   Other Topics Concern  . Not on file   Social History Narrative     BP 126/82 mmHg  Pulse 89  Ht 5' 11"  (1.803 m)  Wt 193 lb 12.8 oz (87.907 kg)  BMI 27.04 kg/m2   Physical Exam:  Well appearing elderly man, looking younger than his stated age, NAD HEENT: Unremarkable Neck:  No JVD, no thyromegally Back:  No CVA tenderness Lungs:  Clear with no wheezes HEART:  Regular rate rhythm, no rubs, no clicks but a 2/6 systolic murmur Abd:  soft, positive bowel sounds, no organomegally, no rebound, no guarding Ext:  2 plus pulses, no edema, no cyanosis, no clubbing Skin:  No rashes no nodules Neuro:  CN II through XII intact, motor grossly intact   DEVICE  Normal device function.  See PaceArt for details. Atrial fib is present for upto 8 hours at a time.   Assess/Plan: 1. Atrial fib - we spent a significant amount of time debating the pro's and con's of eliquis vs ASA for stroke prevention. His CHADS VASC score is high. I have recommended he start Eliquis and stop it if he has an UC flair. 2. PPM - his medtronic DDD PM is working normally. Will recheck in several months. 3. CAD, s/p CABG - he remains active exercising regularly and has no anginal symptoms. Will follow. 4. HTN - his blood pressure is good today. Will follow.  [name].

## 2016-01-21 NOTE — Telephone Encounter (Signed)
Pt stated during d/c from OV pt no longer using [facility] but now with [facility] which is handled by [facility]. Pt Rx for Eliquis 5 mg BID STOPPED with [facility]. concated [facility] and Rx placed on HOLD until 6/3 as pt has 2 week supply of medication given during OV. Pt to call in Rx when he is ready for them to fill.

## 2016-01-21 NOTE — Patient Instructions (Addendum)
Medication Instructions:  Your physician has recommended you make the following change in your medication:  1) STOP Aspirin  2) START Eliquis 5 mg by mouth TWICE daily - pt given Rx card and 2 weeks worth of samples   Labwork: none  Testing/Procedures: none  Follow-Up: Remote monitoring is used to monitor your Pacemaker or ICD from home. This monitoring reduces the number of office visits required to check your device to one time per year. It allows [place] to keep an eye on the functioning of your device to ensure it is working properly. You are scheduled for a device check from home on 04/21/16. You may send your transmission at any time that day. If you have a wireless device, the transmission will be sent automatically. After your physician reviews your transmission, you will receive a postcard with your next transmission date.  Your physician wants you to follow-up in: 12 months with Dr. Lovena Le. You will receive a reminder letter in the mail two months in advance. If you don't receive a letter, please call our office to schedule the follow-up appointment.    Any Other Special Instructions Will Be Listed Below (If Applicable).     If you need a refill on your cardiac medications before your next appointment, please call your pharmacy.

## 2016-01-28 ENCOUNTER — Other Ambulatory Visit: Payer: Self-pay | Admitting: Geriatric Medicine

## 2016-01-28 ENCOUNTER — Ambulatory Visit: Payer: Medicare Other | Admitting: Cardiology

## 2016-01-28 DIAGNOSIS — S81802D Unspecified open wound, left lower leg, subsequent encounter: Secondary | ICD-10-CM | POA: Diagnosis not present

## 2016-01-28 DIAGNOSIS — I10 Essential (primary) hypertension: Secondary | ICD-10-CM | POA: Diagnosis not present

## 2016-02-04 ENCOUNTER — Ambulatory Visit
Admission: RE | Admit: 2016-02-04 | Discharge: 2016-02-04 | Disposition: A | Discharge status: Home / Self Care | Payer: Medicare Other | Source: Ambulatory Visit | Attending: Geriatric Medicine | Admitting: Geriatric Medicine

## 2016-02-04 DIAGNOSIS — S81802D Unspecified open wound, left lower leg, subsequent encounter: Secondary | ICD-10-CM

## 2016-02-04 DIAGNOSIS — S81809A Unspecified open wound, unspecified lower leg, initial encounter: Secondary | ICD-10-CM | POA: Diagnosis not present

## 2016-02-11 ENCOUNTER — Encounter (HOSPITAL_BASED_OUTPATIENT_CLINIC_OR_DEPARTMENT_OTHER): Payer: Medicare Other | Attending: Surgery

## 2016-02-11 DIAGNOSIS — Z950 Presence of cardiac pacemaker: Secondary | ICD-10-CM | POA: Diagnosis not present

## 2016-02-11 DIAGNOSIS — W228XXA Striking against or struck by other objects, initial encounter: Secondary | ICD-10-CM | POA: Diagnosis not present

## 2016-02-11 DIAGNOSIS — H409 Unspecified glaucoma: Secondary | ICD-10-CM | POA: Insufficient documentation

## 2016-02-11 DIAGNOSIS — M70862 Other soft tissue disorders related to use, overuse and pressure, left lower leg: Secondary | ICD-10-CM | POA: Insufficient documentation

## 2016-02-11 DIAGNOSIS — Z7901 Long term (current) use of anticoagulants: Secondary | ICD-10-CM | POA: Diagnosis not present

## 2016-02-11 DIAGNOSIS — S81812A Laceration without foreign body, left lower leg, initial encounter: Secondary | ICD-10-CM | POA: Diagnosis not present

## 2016-02-11 DIAGNOSIS — I2510 Atherosclerotic heart disease of native coronary artery without angina pectoris: Secondary | ICD-10-CM | POA: Diagnosis not present

## 2016-02-11 DIAGNOSIS — E785 Hyperlipidemia, unspecified: Secondary | ICD-10-CM | POA: Diagnosis not present

## 2016-02-11 DIAGNOSIS — Z79899 Other long term (current) drug therapy: Secondary | ICD-10-CM | POA: Insufficient documentation

## 2016-02-11 DIAGNOSIS — Z8673 Personal history of transient ischemic attack (TIA), and cerebral infarction without residual deficits: Secondary | ICD-10-CM | POA: Insufficient documentation

## 2016-02-11 DIAGNOSIS — I872 Venous insufficiency (chronic) (peripheral): Secondary | ICD-10-CM | POA: Diagnosis not present

## 2016-02-11 DIAGNOSIS — Z87891 Personal history of nicotine dependence: Secondary | ICD-10-CM | POA: Diagnosis not present

## 2016-02-11 DIAGNOSIS — Z951 Presence of aortocoronary bypass graft: Secondary | ICD-10-CM | POA: Diagnosis not present

## 2016-02-11 DIAGNOSIS — I739 Peripheral vascular disease, unspecified: Secondary | ICD-10-CM | POA: Diagnosis not present

## 2016-02-11 DIAGNOSIS — L97223 Non-pressure chronic ulcer of left calf with necrosis of muscle: Secondary | ICD-10-CM | POA: Diagnosis not present

## 2016-02-11 DIAGNOSIS — I10 Essential (primary) hypertension: Secondary | ICD-10-CM | POA: Diagnosis not present

## 2016-02-11 DIAGNOSIS — L97823 Non-pressure chronic ulcer of other part of left lower leg with necrosis of muscle: Secondary | ICD-10-CM | POA: Diagnosis not present

## 2016-02-14 DIAGNOSIS — Z7982 Long term (current) use of aspirin: Secondary | ICD-10-CM | POA: Diagnosis not present

## 2016-02-14 DIAGNOSIS — I779 Disorder of arteries and arterioles, unspecified: Secondary | ICD-10-CM | POA: Diagnosis not present

## 2016-02-14 DIAGNOSIS — S81802D Unspecified open wound, left lower leg, subsequent encounter: Secondary | ICD-10-CM | POA: Diagnosis not present

## 2016-02-14 DIAGNOSIS — Z8673 Personal history of transient ischemic attack (TIA), and cerebral infarction without residual deficits: Secondary | ICD-10-CM | POA: Diagnosis not present

## 2016-02-14 DIAGNOSIS — Z951 Presence of aortocoronary bypass graft: Secondary | ICD-10-CM | POA: Diagnosis not present

## 2016-02-14 DIAGNOSIS — I10 Essential (primary) hypertension: Secondary | ICD-10-CM | POA: Diagnosis not present

## 2016-02-14 DIAGNOSIS — D649 Anemia, unspecified: Secondary | ICD-10-CM | POA: Diagnosis not present

## 2016-02-14 DIAGNOSIS — I2510 Atherosclerotic heart disease of native coronary artery without angina pectoris: Secondary | ICD-10-CM | POA: Diagnosis not present

## 2016-02-14 DIAGNOSIS — F419 Anxiety disorder, unspecified: Secondary | ICD-10-CM | POA: Diagnosis not present

## 2016-02-14 DIAGNOSIS — I4891 Unspecified atrial fibrillation: Secondary | ICD-10-CM | POA: Diagnosis not present

## 2016-02-14 DIAGNOSIS — Z950 Presence of cardiac pacemaker: Secondary | ICD-10-CM | POA: Diagnosis not present

## 2016-02-14 DIAGNOSIS — I739 Peripheral vascular disease, unspecified: Secondary | ICD-10-CM | POA: Diagnosis not present

## 2016-02-14 DIAGNOSIS — E785 Hyperlipidemia, unspecified: Secondary | ICD-10-CM | POA: Diagnosis not present

## 2016-02-14 DIAGNOSIS — I495 Sick sinus syndrome: Secondary | ICD-10-CM | POA: Diagnosis not present

## 2016-02-16 DIAGNOSIS — S81802D Unspecified open wound, left lower leg, subsequent encounter: Secondary | ICD-10-CM | POA: Diagnosis not present

## 2016-02-16 DIAGNOSIS — I4891 Unspecified atrial fibrillation: Secondary | ICD-10-CM | POA: Diagnosis not present

## 2016-02-16 DIAGNOSIS — I2510 Atherosclerotic heart disease of native coronary artery without angina pectoris: Secondary | ICD-10-CM | POA: Diagnosis not present

## 2016-02-16 DIAGNOSIS — I495 Sick sinus syndrome: Secondary | ICD-10-CM | POA: Diagnosis not present

## 2016-02-16 DIAGNOSIS — I779 Disorder of arteries and arterioles, unspecified: Secondary | ICD-10-CM | POA: Diagnosis not present

## 2016-02-16 DIAGNOSIS — I739 Peripheral vascular disease, unspecified: Secondary | ICD-10-CM | POA: Diagnosis not present

## 2016-02-18 DIAGNOSIS — M70862 Other soft tissue disorders related to use, overuse and pressure, left lower leg: Secondary | ICD-10-CM | POA: Diagnosis not present

## 2016-02-18 DIAGNOSIS — I87312 Chronic venous hypertension (idiopathic) with ulcer of left lower extremity: Secondary | ICD-10-CM | POA: Diagnosis not present

## 2016-02-18 DIAGNOSIS — L97223 Non-pressure chronic ulcer of left calf with necrosis of muscle: Secondary | ICD-10-CM | POA: Diagnosis not present

## 2016-02-18 DIAGNOSIS — E785 Hyperlipidemia, unspecified: Secondary | ICD-10-CM | POA: Diagnosis not present

## 2016-02-18 DIAGNOSIS — S81812A Laceration without foreign body, left lower leg, initial encounter: Secondary | ICD-10-CM | POA: Diagnosis not present

## 2016-02-18 DIAGNOSIS — S81802A Unspecified open wound, left lower leg, initial encounter: Secondary | ICD-10-CM | POA: Diagnosis not present

## 2016-02-18 DIAGNOSIS — Z950 Presence of cardiac pacemaker: Secondary | ICD-10-CM | POA: Diagnosis not present

## 2016-02-18 DIAGNOSIS — I10 Essential (primary) hypertension: Secondary | ICD-10-CM | POA: Diagnosis not present

## 2016-02-19 DIAGNOSIS — L97223 Non-pressure chronic ulcer of left calf with necrosis of muscle: Secondary | ICD-10-CM | POA: Diagnosis not present

## 2016-02-20 DIAGNOSIS — I495 Sick sinus syndrome: Secondary | ICD-10-CM | POA: Diagnosis not present

## 2016-02-20 DIAGNOSIS — I4891 Unspecified atrial fibrillation: Secondary | ICD-10-CM | POA: Diagnosis not present

## 2016-02-20 DIAGNOSIS — I2510 Atherosclerotic heart disease of native coronary artery without angina pectoris: Secondary | ICD-10-CM | POA: Diagnosis not present

## 2016-02-20 DIAGNOSIS — S81802D Unspecified open wound, left lower leg, subsequent encounter: Secondary | ICD-10-CM | POA: Diagnosis not present

## 2016-02-20 DIAGNOSIS — I779 Disorder of arteries and arterioles, unspecified: Secondary | ICD-10-CM | POA: Diagnosis not present

## 2016-02-20 DIAGNOSIS — I739 Peripheral vascular disease, unspecified: Secondary | ICD-10-CM | POA: Diagnosis not present

## 2016-02-23 DIAGNOSIS — S81802D Unspecified open wound, left lower leg, subsequent encounter: Secondary | ICD-10-CM | POA: Diagnosis not present

## 2016-02-23 DIAGNOSIS — I739 Peripheral vascular disease, unspecified: Secondary | ICD-10-CM | POA: Diagnosis not present

## 2016-02-23 DIAGNOSIS — I779 Disorder of arteries and arterioles, unspecified: Secondary | ICD-10-CM | POA: Diagnosis not present

## 2016-02-23 DIAGNOSIS — I4891 Unspecified atrial fibrillation: Secondary | ICD-10-CM | POA: Diagnosis not present

## 2016-02-23 DIAGNOSIS — I2510 Atherosclerotic heart disease of native coronary artery without angina pectoris: Secondary | ICD-10-CM | POA: Diagnosis not present

## 2016-02-23 DIAGNOSIS — I495 Sick sinus syndrome: Secondary | ICD-10-CM | POA: Diagnosis not present

## 2016-02-25 DIAGNOSIS — M70862 Other soft tissue disorders related to use, overuse and pressure, left lower leg: Secondary | ICD-10-CM | POA: Diagnosis not present

## 2016-02-25 DIAGNOSIS — E785 Hyperlipidemia, unspecified: Secondary | ICD-10-CM | POA: Diagnosis not present

## 2016-02-25 DIAGNOSIS — L97223 Non-pressure chronic ulcer of left calf with necrosis of muscle: Secondary | ICD-10-CM | POA: Diagnosis not present

## 2016-02-25 DIAGNOSIS — Z950 Presence of cardiac pacemaker: Secondary | ICD-10-CM | POA: Diagnosis not present

## 2016-02-25 DIAGNOSIS — I10 Essential (primary) hypertension: Secondary | ICD-10-CM | POA: Diagnosis not present

## 2016-02-25 DIAGNOSIS — S81812A Laceration without foreign body, left lower leg, initial encounter: Secondary | ICD-10-CM | POA: Diagnosis not present

## 2016-02-25 DIAGNOSIS — I87312 Chronic venous hypertension (idiopathic) with ulcer of left lower extremity: Secondary | ICD-10-CM | POA: Diagnosis not present

## 2016-02-25 DIAGNOSIS — S81802A Unspecified open wound, left lower leg, initial encounter: Secondary | ICD-10-CM | POA: Diagnosis not present

## 2016-02-27 DIAGNOSIS — I495 Sick sinus syndrome: Secondary | ICD-10-CM | POA: Diagnosis not present

## 2016-02-27 DIAGNOSIS — I2510 Atherosclerotic heart disease of native coronary artery without angina pectoris: Secondary | ICD-10-CM | POA: Diagnosis not present

## 2016-02-27 DIAGNOSIS — I4891 Unspecified atrial fibrillation: Secondary | ICD-10-CM | POA: Diagnosis not present

## 2016-02-27 DIAGNOSIS — I739 Peripheral vascular disease, unspecified: Secondary | ICD-10-CM | POA: Diagnosis not present

## 2016-02-27 DIAGNOSIS — I779 Disorder of arteries and arterioles, unspecified: Secondary | ICD-10-CM | POA: Diagnosis not present

## 2016-02-27 DIAGNOSIS — S81802D Unspecified open wound, left lower leg, subsequent encounter: Secondary | ICD-10-CM | POA: Diagnosis not present

## 2016-03-01 DIAGNOSIS — I739 Peripheral vascular disease, unspecified: Secondary | ICD-10-CM | POA: Diagnosis not present

## 2016-03-01 DIAGNOSIS — I4891 Unspecified atrial fibrillation: Secondary | ICD-10-CM | POA: Diagnosis not present

## 2016-03-01 DIAGNOSIS — I779 Disorder of arteries and arterioles, unspecified: Secondary | ICD-10-CM | POA: Diagnosis not present

## 2016-03-01 DIAGNOSIS — S81802D Unspecified open wound, left lower leg, subsequent encounter: Secondary | ICD-10-CM | POA: Diagnosis not present

## 2016-03-01 DIAGNOSIS — I2510 Atherosclerotic heart disease of native coronary artery without angina pectoris: Secondary | ICD-10-CM | POA: Diagnosis not present

## 2016-03-01 DIAGNOSIS — I495 Sick sinus syndrome: Secondary | ICD-10-CM | POA: Diagnosis not present

## 2016-03-03 ENCOUNTER — Encounter (HOSPITAL_BASED_OUTPATIENT_CLINIC_OR_DEPARTMENT_OTHER): Payer: Medicare Other | Attending: Surgery

## 2016-03-03 DIAGNOSIS — I10 Essential (primary) hypertension: Secondary | ICD-10-CM | POA: Insufficient documentation

## 2016-03-03 DIAGNOSIS — L97822 Non-pressure chronic ulcer of other part of left lower leg with fat layer exposed: Secondary | ICD-10-CM | POA: Insufficient documentation

## 2016-03-03 DIAGNOSIS — I87312 Chronic venous hypertension (idiopathic) with ulcer of left lower extremity: Secondary | ICD-10-CM | POA: Diagnosis not present

## 2016-03-03 DIAGNOSIS — M70862 Other soft tissue disorders related to use, overuse and pressure, left lower leg: Secondary | ICD-10-CM | POA: Diagnosis not present

## 2016-03-03 DIAGNOSIS — I2510 Atherosclerotic heart disease of native coronary artery without angina pectoris: Secondary | ICD-10-CM | POA: Insufficient documentation

## 2016-03-03 DIAGNOSIS — L97223 Non-pressure chronic ulcer of left calf with necrosis of muscle: Secondary | ICD-10-CM | POA: Diagnosis not present

## 2016-03-03 DIAGNOSIS — S81802A Unspecified open wound, left lower leg, initial encounter: Secondary | ICD-10-CM | POA: Diagnosis not present

## 2016-03-03 DIAGNOSIS — S81812A Laceration without foreign body, left lower leg, initial encounter: Secondary | ICD-10-CM | POA: Diagnosis not present

## 2016-03-05 DIAGNOSIS — I739 Peripheral vascular disease, unspecified: Secondary | ICD-10-CM | POA: Diagnosis not present

## 2016-03-05 DIAGNOSIS — I779 Disorder of arteries and arterioles, unspecified: Secondary | ICD-10-CM | POA: Diagnosis not present

## 2016-03-05 DIAGNOSIS — S81802D Unspecified open wound, left lower leg, subsequent encounter: Secondary | ICD-10-CM | POA: Diagnosis not present

## 2016-03-05 DIAGNOSIS — I2510 Atherosclerotic heart disease of native coronary artery without angina pectoris: Secondary | ICD-10-CM | POA: Diagnosis not present

## 2016-03-05 DIAGNOSIS — I4891 Unspecified atrial fibrillation: Secondary | ICD-10-CM | POA: Diagnosis not present

## 2016-03-05 DIAGNOSIS — I495 Sick sinus syndrome: Secondary | ICD-10-CM | POA: Diagnosis not present

## 2016-03-08 DIAGNOSIS — I4891 Unspecified atrial fibrillation: Secondary | ICD-10-CM | POA: Diagnosis not present

## 2016-03-08 DIAGNOSIS — I739 Peripheral vascular disease, unspecified: Secondary | ICD-10-CM | POA: Diagnosis not present

## 2016-03-08 DIAGNOSIS — I2510 Atherosclerotic heart disease of native coronary artery without angina pectoris: Secondary | ICD-10-CM | POA: Diagnosis not present

## 2016-03-08 DIAGNOSIS — I495 Sick sinus syndrome: Secondary | ICD-10-CM | POA: Diagnosis not present

## 2016-03-08 DIAGNOSIS — S81802D Unspecified open wound, left lower leg, subsequent encounter: Secondary | ICD-10-CM | POA: Diagnosis not present

## 2016-03-08 DIAGNOSIS — I779 Disorder of arteries and arterioles, unspecified: Secondary | ICD-10-CM | POA: Diagnosis not present

## 2016-03-10 DIAGNOSIS — I2510 Atherosclerotic heart disease of native coronary artery without angina pectoris: Secondary | ICD-10-CM | POA: Diagnosis not present

## 2016-03-10 DIAGNOSIS — L97822 Non-pressure chronic ulcer of other part of left lower leg with fat layer exposed: Secondary | ICD-10-CM | POA: Diagnosis not present

## 2016-03-10 DIAGNOSIS — I10 Essential (primary) hypertension: Secondary | ICD-10-CM | POA: Diagnosis not present

## 2016-03-12 DIAGNOSIS — S81802D Unspecified open wound, left lower leg, subsequent encounter: Secondary | ICD-10-CM | POA: Diagnosis not present

## 2016-03-12 DIAGNOSIS — I495 Sick sinus syndrome: Secondary | ICD-10-CM | POA: Diagnosis not present

## 2016-03-12 DIAGNOSIS — I2510 Atherosclerotic heart disease of native coronary artery without angina pectoris: Secondary | ICD-10-CM | POA: Diagnosis not present

## 2016-03-12 DIAGNOSIS — I4891 Unspecified atrial fibrillation: Secondary | ICD-10-CM | POA: Diagnosis not present

## 2016-03-12 DIAGNOSIS — I779 Disorder of arteries and arterioles, unspecified: Secondary | ICD-10-CM | POA: Diagnosis not present

## 2016-03-12 DIAGNOSIS — I739 Peripheral vascular disease, unspecified: Secondary | ICD-10-CM | POA: Diagnosis not present

## 2016-03-15 DIAGNOSIS — I4891 Unspecified atrial fibrillation: Secondary | ICD-10-CM | POA: Diagnosis not present

## 2016-03-15 DIAGNOSIS — I739 Peripheral vascular disease, unspecified: Secondary | ICD-10-CM | POA: Diagnosis not present

## 2016-03-15 DIAGNOSIS — S81802D Unspecified open wound, left lower leg, subsequent encounter: Secondary | ICD-10-CM | POA: Diagnosis not present

## 2016-03-15 DIAGNOSIS — I2510 Atherosclerotic heart disease of native coronary artery without angina pectoris: Secondary | ICD-10-CM | POA: Diagnosis not present

## 2016-03-15 DIAGNOSIS — I495 Sick sinus syndrome: Secondary | ICD-10-CM | POA: Diagnosis not present

## 2016-03-15 DIAGNOSIS — I779 Disorder of arteries and arterioles, unspecified: Secondary | ICD-10-CM | POA: Diagnosis not present

## 2016-03-17 DIAGNOSIS — M70862 Other soft tissue disorders related to use, overuse and pressure, left lower leg: Secondary | ICD-10-CM | POA: Diagnosis not present

## 2016-03-17 DIAGNOSIS — I10 Essential (primary) hypertension: Secondary | ICD-10-CM | POA: Diagnosis not present

## 2016-03-17 DIAGNOSIS — L97822 Non-pressure chronic ulcer of other part of left lower leg with fat layer exposed: Secondary | ICD-10-CM | POA: Diagnosis not present

## 2016-03-17 DIAGNOSIS — I872 Venous insufficiency (chronic) (peripheral): Secondary | ICD-10-CM | POA: Diagnosis not present

## 2016-03-17 DIAGNOSIS — S81812A Laceration without foreign body, left lower leg, initial encounter: Secondary | ICD-10-CM | POA: Diagnosis not present

## 2016-03-17 DIAGNOSIS — I2510 Atherosclerotic heart disease of native coronary artery without angina pectoris: Secondary | ICD-10-CM | POA: Diagnosis not present

## 2016-03-17 DIAGNOSIS — L97223 Non-pressure chronic ulcer of left calf with necrosis of muscle: Secondary | ICD-10-CM | POA: Diagnosis not present

## 2016-03-19 DIAGNOSIS — I739 Peripheral vascular disease, unspecified: Secondary | ICD-10-CM | POA: Diagnosis not present

## 2016-03-19 DIAGNOSIS — I495 Sick sinus syndrome: Secondary | ICD-10-CM | POA: Diagnosis not present

## 2016-03-19 DIAGNOSIS — I779 Disorder of arteries and arterioles, unspecified: Secondary | ICD-10-CM | POA: Diagnosis not present

## 2016-03-19 DIAGNOSIS — I2510 Atherosclerotic heart disease of native coronary artery without angina pectoris: Secondary | ICD-10-CM | POA: Diagnosis not present

## 2016-03-19 DIAGNOSIS — S81802D Unspecified open wound, left lower leg, subsequent encounter: Secondary | ICD-10-CM | POA: Diagnosis not present

## 2016-03-19 DIAGNOSIS — I4891 Unspecified atrial fibrillation: Secondary | ICD-10-CM | POA: Diagnosis not present

## 2016-03-22 DIAGNOSIS — I779 Disorder of arteries and arterioles, unspecified: Secondary | ICD-10-CM | POA: Diagnosis not present

## 2016-03-22 DIAGNOSIS — S81802D Unspecified open wound, left lower leg, subsequent encounter: Secondary | ICD-10-CM | POA: Diagnosis not present

## 2016-03-22 DIAGNOSIS — I739 Peripheral vascular disease, unspecified: Secondary | ICD-10-CM | POA: Diagnosis not present

## 2016-03-22 DIAGNOSIS — I495 Sick sinus syndrome: Secondary | ICD-10-CM | POA: Diagnosis not present

## 2016-03-22 DIAGNOSIS — I4891 Unspecified atrial fibrillation: Secondary | ICD-10-CM | POA: Diagnosis not present

## 2016-03-22 DIAGNOSIS — I2510 Atherosclerotic heart disease of native coronary artery without angina pectoris: Secondary | ICD-10-CM | POA: Diagnosis not present

## 2016-03-24 DIAGNOSIS — I872 Venous insufficiency (chronic) (peripheral): Secondary | ICD-10-CM | POA: Diagnosis not present

## 2016-03-24 DIAGNOSIS — S81812A Laceration without foreign body, left lower leg, initial encounter: Secondary | ICD-10-CM | POA: Diagnosis not present

## 2016-03-24 DIAGNOSIS — L97223 Non-pressure chronic ulcer of left calf with necrosis of muscle: Secondary | ICD-10-CM | POA: Diagnosis not present

## 2016-03-24 DIAGNOSIS — M70862 Other soft tissue disorders related to use, overuse and pressure, left lower leg: Secondary | ICD-10-CM | POA: Diagnosis not present

## 2016-03-24 DIAGNOSIS — I10 Essential (primary) hypertension: Secondary | ICD-10-CM | POA: Diagnosis not present

## 2016-03-24 DIAGNOSIS — I2510 Atherosclerotic heart disease of native coronary artery without angina pectoris: Secondary | ICD-10-CM | POA: Diagnosis not present

## 2016-03-24 DIAGNOSIS — L97822 Non-pressure chronic ulcer of other part of left lower leg with fat layer exposed: Secondary | ICD-10-CM | POA: Diagnosis not present

## 2016-03-26 DIAGNOSIS — I495 Sick sinus syndrome: Secondary | ICD-10-CM | POA: Diagnosis not present

## 2016-03-26 DIAGNOSIS — I4891 Unspecified atrial fibrillation: Secondary | ICD-10-CM | POA: Diagnosis not present

## 2016-03-26 DIAGNOSIS — I779 Disorder of arteries and arterioles, unspecified: Secondary | ICD-10-CM | POA: Diagnosis not present

## 2016-03-26 DIAGNOSIS — I2510 Atherosclerotic heart disease of native coronary artery without angina pectoris: Secondary | ICD-10-CM | POA: Diagnosis not present

## 2016-03-26 DIAGNOSIS — S81802D Unspecified open wound, left lower leg, subsequent encounter: Secondary | ICD-10-CM | POA: Diagnosis not present

## 2016-03-26 DIAGNOSIS — I739 Peripheral vascular disease, unspecified: Secondary | ICD-10-CM | POA: Diagnosis not present

## 2016-03-29 DIAGNOSIS — I4891 Unspecified atrial fibrillation: Secondary | ICD-10-CM | POA: Diagnosis not present

## 2016-03-29 DIAGNOSIS — S81802D Unspecified open wound, left lower leg, subsequent encounter: Secondary | ICD-10-CM | POA: Diagnosis not present

## 2016-03-29 DIAGNOSIS — I495 Sick sinus syndrome: Secondary | ICD-10-CM | POA: Diagnosis not present

## 2016-03-29 DIAGNOSIS — I2510 Atherosclerotic heart disease of native coronary artery without angina pectoris: Secondary | ICD-10-CM | POA: Diagnosis not present

## 2016-03-29 DIAGNOSIS — I779 Disorder of arteries and arterioles, unspecified: Secondary | ICD-10-CM | POA: Diagnosis not present

## 2016-03-29 DIAGNOSIS — I739 Peripheral vascular disease, unspecified: Secondary | ICD-10-CM | POA: Diagnosis not present

## 2016-03-30 DIAGNOSIS — L97821 Non-pressure chronic ulcer of other part of left lower leg limited to breakdown of skin: Secondary | ICD-10-CM | POA: Diagnosis not present

## 2016-03-31 ENCOUNTER — Encounter (HOSPITAL_BASED_OUTPATIENT_CLINIC_OR_DEPARTMENT_OTHER): Payer: Medicare Other | Attending: Surgery

## 2016-03-31 DIAGNOSIS — Z951 Presence of aortocoronary bypass graft: Secondary | ICD-10-CM | POA: Insufficient documentation

## 2016-03-31 DIAGNOSIS — S81812A Laceration without foreign body, left lower leg, initial encounter: Secondary | ICD-10-CM | POA: Insufficient documentation

## 2016-03-31 DIAGNOSIS — I10 Essential (primary) hypertension: Secondary | ICD-10-CM | POA: Diagnosis not present

## 2016-03-31 DIAGNOSIS — E785 Hyperlipidemia, unspecified: Secondary | ICD-10-CM | POA: Diagnosis not present

## 2016-03-31 DIAGNOSIS — Z8673 Personal history of transient ischemic attack (TIA), and cerebral infarction without residual deficits: Secondary | ICD-10-CM | POA: Diagnosis not present

## 2016-03-31 DIAGNOSIS — X58XXXA Exposure to other specified factors, initial encounter: Secondary | ICD-10-CM | POA: Diagnosis not present

## 2016-03-31 DIAGNOSIS — Z87891 Personal history of nicotine dependence: Secondary | ICD-10-CM | POA: Diagnosis not present

## 2016-03-31 DIAGNOSIS — Z950 Presence of cardiac pacemaker: Secondary | ICD-10-CM | POA: Insufficient documentation

## 2016-03-31 DIAGNOSIS — M70862 Other soft tissue disorders related to use, overuse and pressure, left lower leg: Secondary | ICD-10-CM | POA: Diagnosis not present

## 2016-03-31 DIAGNOSIS — L97821 Non-pressure chronic ulcer of other part of left lower leg limited to breakdown of skin: Secondary | ICD-10-CM | POA: Insufficient documentation

## 2016-03-31 DIAGNOSIS — I739 Peripheral vascular disease, unspecified: Secondary | ICD-10-CM | POA: Diagnosis not present

## 2016-03-31 DIAGNOSIS — H409 Unspecified glaucoma: Secondary | ICD-10-CM | POA: Diagnosis not present

## 2016-03-31 DIAGNOSIS — Z96659 Presence of unspecified artificial knee joint: Secondary | ICD-10-CM | POA: Insufficient documentation

## 2016-03-31 DIAGNOSIS — I2510 Atherosclerotic heart disease of native coronary artery without angina pectoris: Secondary | ICD-10-CM | POA: Diagnosis not present

## 2016-03-31 DIAGNOSIS — L97221 Non-pressure chronic ulcer of left calf limited to breakdown of skin: Secondary | ICD-10-CM | POA: Insufficient documentation

## 2016-03-31 DIAGNOSIS — L97223 Non-pressure chronic ulcer of left calf with necrosis of muscle: Secondary | ICD-10-CM | POA: Diagnosis not present

## 2016-03-31 DIAGNOSIS — I872 Venous insufficiency (chronic) (peripheral): Secondary | ICD-10-CM | POA: Diagnosis not present

## 2016-04-07 DIAGNOSIS — M70862 Other soft tissue disorders related to use, overuse and pressure, left lower leg: Secondary | ICD-10-CM | POA: Diagnosis not present

## 2016-04-07 DIAGNOSIS — L97821 Non-pressure chronic ulcer of other part of left lower leg limited to breakdown of skin: Secondary | ICD-10-CM | POA: Diagnosis not present

## 2016-04-07 DIAGNOSIS — I872 Venous insufficiency (chronic) (peripheral): Secondary | ICD-10-CM | POA: Diagnosis not present

## 2016-04-07 DIAGNOSIS — H409 Unspecified glaucoma: Secondary | ICD-10-CM | POA: Diagnosis not present

## 2016-04-07 DIAGNOSIS — S81812A Laceration without foreign body, left lower leg, initial encounter: Secondary | ICD-10-CM | POA: Diagnosis not present

## 2016-04-07 DIAGNOSIS — L97221 Non-pressure chronic ulcer of left calf limited to breakdown of skin: Secondary | ICD-10-CM | POA: Diagnosis not present

## 2016-04-07 DIAGNOSIS — I2510 Atherosclerotic heart disease of native coronary artery without angina pectoris: Secondary | ICD-10-CM | POA: Diagnosis not present

## 2016-04-07 DIAGNOSIS — L97822 Non-pressure chronic ulcer of other part of left lower leg with fat layer exposed: Secondary | ICD-10-CM | POA: Diagnosis not present

## 2016-04-14 DIAGNOSIS — S81812A Laceration without foreign body, left lower leg, initial encounter: Secondary | ICD-10-CM | POA: Diagnosis not present

## 2016-04-14 DIAGNOSIS — M70862 Other soft tissue disorders related to use, overuse and pressure, left lower leg: Secondary | ICD-10-CM | POA: Diagnosis not present

## 2016-04-14 DIAGNOSIS — L97221 Non-pressure chronic ulcer of left calf limited to breakdown of skin: Secondary | ICD-10-CM | POA: Diagnosis not present

## 2016-04-14 DIAGNOSIS — I872 Venous insufficiency (chronic) (peripheral): Secondary | ICD-10-CM | POA: Diagnosis not present

## 2016-04-14 DIAGNOSIS — H409 Unspecified glaucoma: Secondary | ICD-10-CM | POA: Diagnosis not present

## 2016-04-14 DIAGNOSIS — I2510 Atherosclerotic heart disease of native coronary artery without angina pectoris: Secondary | ICD-10-CM | POA: Diagnosis not present

## 2016-04-14 DIAGNOSIS — L97821 Non-pressure chronic ulcer of other part of left lower leg limited to breakdown of skin: Secondary | ICD-10-CM | POA: Diagnosis not present

## 2016-04-14 DIAGNOSIS — L97822 Non-pressure chronic ulcer of other part of left lower leg with fat layer exposed: Secondary | ICD-10-CM | POA: Diagnosis not present

## 2016-04-20 NOTE — H&P (Signed)
  Subjective:    Patient ID: Dalton Townsend is a 82 y.o. male.  HPI  Referred by [facility]. [facility] for evaluation leg wound. Suffered trauma to leg, car door April 2017.  PMH significant for CAD, UC, AS, HTN, and atrial fibrillation with pacemaker in place, hx TIA. Eliquis prescribed 12/2015; of note Cardiology visit states patient had been reluctant to start anticoagulation given his UC- he had not started anticoagulation prior to trauma and has never started since. Underwent in office debridement with Dr. Rosana Hoes 6.22.17. Current wound care NPWT,has [facility]. Dr. Con Memos has discussed application skin substitute. Has not had any prior applications skin substitutes. Here with wife and daughter, latter who works for [employer]. Notes left leg veins harvested for CABG.  01/2016 ABI R 1.15 L 0.83, monophasic at ankle bilateral with small vessel disease foot  Last ECHO 04/2014 with EF 60-65 %, moderate AS.   Review of Systems  HENT: Positive for hearing loss.   Cardiovascular: Positive for leg swelling.  Allergic/Immunologic: Positive for environmental allergies.  [place] 12 point review negative     Objective:   Physical Exam  Constitutional: He is oriented to person, place, and time.  Cardiovascular: Normal rate.   Murmur heard. Pulmonary/Chest: Effort normal and breath sounds normal.  Neurological: He is alert and oriented to person, place, and time.      left anterolateral leg: two adjacent open wounds, measured as cluster 12x4.5 x 0.5 cm, 100% granulation with some hypergranulation present, surrounding edema leg Excoriation from NPWT tape, edema of leg  Assessment:     Open wound leg    Plan:    Reviewed option continue local wound care alone, skin substitutes, vs STSG. Reviewed latter would be GA, donor site pain and recovery, use of VAC over grafted area. If a STSG was successful, certainly this would get to endpoint wound healing fastest. Expressed concern  that given edema legs, age graft may be risk sloughing over early period.   Reviewed skin substitutes goal is to stimulate wound healing, may require multiple applications, cannot place in office here. This could be done under minimal sedation under local and OP surgery likely.   They will proceed with skin substitute placement, plan [place]. We discussed that nearly all skin substitutes are not approved for conditions outside of burn, diabetic or venous ulcers, epidermolysis bullosa, including A Cell. We have discussed with he and family that Jannifer Hick will not be covered by his secondary insurance.  Irene Limbo, MD [facility] [phone], pin [phone]

## 2016-04-21 ENCOUNTER — Ambulatory Visit (INDEPENDENT_AMBULATORY_CARE_PROVIDER_SITE_OTHER): Payer: Medicare Other | Admitting: *Deleted

## 2016-04-21 ENCOUNTER — Telehealth: Payer: Self-pay | Admitting: Cardiology

## 2016-04-21 DIAGNOSIS — I480 Paroxysmal atrial fibrillation: Secondary | ICD-10-CM

## 2016-04-21 DIAGNOSIS — H409 Unspecified glaucoma: Secondary | ICD-10-CM | POA: Diagnosis not present

## 2016-04-21 DIAGNOSIS — M70862 Other soft tissue disorders related to use, overuse and pressure, left lower leg: Secondary | ICD-10-CM | POA: Diagnosis not present

## 2016-04-21 DIAGNOSIS — L97221 Non-pressure chronic ulcer of left calf limited to breakdown of skin: Secondary | ICD-10-CM | POA: Diagnosis not present

## 2016-04-21 DIAGNOSIS — Z950 Presence of cardiac pacemaker: Secondary | ICD-10-CM

## 2016-04-21 DIAGNOSIS — I2510 Atherosclerotic heart disease of native coronary artery without angina pectoris: Secondary | ICD-10-CM | POA: Diagnosis not present

## 2016-04-21 DIAGNOSIS — S81812A Laceration without foreign body, left lower leg, initial encounter: Secondary | ICD-10-CM | POA: Diagnosis not present

## 2016-04-21 DIAGNOSIS — L97821 Non-pressure chronic ulcer of other part of left lower leg limited to breakdown of skin: Secondary | ICD-10-CM | POA: Diagnosis not present

## 2016-04-21 NOTE — Telephone Encounter (Signed)
LMOVM reminding pt to send remote transmission.   

## 2016-04-22 NOTE — Progress Notes (Signed)
Remote pacemaker transmission.   

## 2016-04-23 ENCOUNTER — Encounter: Payer: Self-pay | Admitting: Cardiology

## 2016-04-28 ENCOUNTER — Encounter: Payer: Self-pay | Admitting: Cardiology

## 2016-04-28 DIAGNOSIS — I87312 Chronic venous hypertension (idiopathic) with ulcer of left lower extremity: Secondary | ICD-10-CM | POA: Diagnosis not present

## 2016-04-28 DIAGNOSIS — M70862 Other soft tissue disorders related to use, overuse and pressure, left lower leg: Secondary | ICD-10-CM | POA: Diagnosis not present

## 2016-04-28 DIAGNOSIS — H409 Unspecified glaucoma: Secondary | ICD-10-CM | POA: Diagnosis not present

## 2016-04-28 DIAGNOSIS — L97221 Non-pressure chronic ulcer of left calf limited to breakdown of skin: Secondary | ICD-10-CM | POA: Diagnosis not present

## 2016-04-28 DIAGNOSIS — S81812A Laceration without foreign body, left lower leg, initial encounter: Secondary | ICD-10-CM | POA: Diagnosis not present

## 2016-04-28 DIAGNOSIS — L97821 Non-pressure chronic ulcer of other part of left lower leg limited to breakdown of skin: Secondary | ICD-10-CM | POA: Diagnosis not present

## 2016-04-28 DIAGNOSIS — L97223 Non-pressure chronic ulcer of left calf with necrosis of muscle: Secondary | ICD-10-CM | POA: Diagnosis not present

## 2016-04-28 DIAGNOSIS — I2510 Atherosclerotic heart disease of native coronary artery without angina pectoris: Secondary | ICD-10-CM | POA: Diagnosis not present

## 2016-04-29 ENCOUNTER — Encounter (HOSPITAL_COMMUNITY): Payer: Self-pay

## 2016-04-29 NOTE — Pre-Procedure Instructions (Signed)
Dalton Townsend  04/29/2016      [facility] [address] [address] to [address] [place] [address] Phone: [phone] Fax: [phone]  [facility] [place], [address] AT [facility] OF [address] [place] [address] Phone: [phone] Fax: [phone]    Your procedure is scheduled on Sept 5  Report to [facility] at 1215   Call this number if you have problems the morning of surgery:  [phone]   Remember:  Do not eat food or drink liquids after midnight on Sept. 4   Take these medicines the morning of surgery with A SIP OF WATER: amlodipine, lexapro, finasteride, toprol,   Stop taking  BC's, Goody's, Herbal medications, Fish Oil, Vitamins, Aleve, Ibuprofen, Advil, Motrin          Continue taking the aspirin     Do not wear jewelry, make-up or nail polish.  Do not wear lotions, powders, or perfumes, or deoderant.  Do not shave 48 hours prior to surgery.  Men may shave face and neck.  Do not bring valuables to the hospital.  [facility] is not responsible for any belongings or valuables.  Contacts, dentures or bridgework may not be worn into surgery.  Leave your suitcase in the car.  After surgery it may be brought to your room.  For patients admitted to the hospital, discharge time will be determined by your treatment team.  Patients discharged the day of surgery will not be allowed to drive home.   Special instructions:   - Preparing for Surgery  Before surgery, you can play an important role.  Because skin is not sterile, your skin needs to be as free of germs as possible.  You can reduce the number of germs on you skin by washing with CHG (chlorahexidine gluconate) soap before surgery.  CHG is an antiseptic cleaner which kills germs and bonds with the skin to continue killing germs even after  washing.  Please DO NOT use if you have an allergy to CHG or antibacterial soaps.  If your skin becomes reddened/irritated stop using the CHG and inform your nurse when you arrive at Short Stay.  Do not shave (including legs and underarms) for at least 48 hours prior to the first CHG shower.  You may shave your face.  Please follow these instructions carefully:   1.  Shower with CHG Soap the night before surgery and the  morning of Surgery.  2.  If you choose to wash your hair, wash your hair first as usual with your   normal shampoo.  3.  After you shampoo, rinse your hair and body thoroughly to remove the                      Shampoo.  4.  Use CHG as you would any other liquid soap.  You can apply chg directly  to the skin and wash gently with scrungie or a clean washcloth.  5.  Apply the CHG Soap to your body ONLY FROM THE NECK DOWN.   Do not use on open wounds or open sores.  Avoid contact with your eyes,   ears, mouth and genitals (private parts).  Wash genitals (private parts) with your normal soap.  6.  Wash thoroughly, paying special attention to the area where your  surgery will be performed.  7.  Thoroughly rinse your body with warm water from the neck down.  8.  DO NOT shower/wash with your normal soap after using and rinsing off  the CHG Soap.  9.  Pat yourself dry with a clean towel.            10.  Wear clean pajamas.            11.  Place clean sheets on your bed the night of your first shower and do not  sleep with pets.  Day of Surgery  Do not apply any lotions/deoderants the morning of surgery.  Please wear clean clothes to the hospital/surgery center.     Please read over the following fact sheets that you were given. Surgical Site Infection Prevention

## 2016-04-30 ENCOUNTER — Encounter (HOSPITAL_COMMUNITY): Payer: Self-pay

## 2016-04-30 ENCOUNTER — Encounter (HOSPITAL_COMMUNITY)
Admission: RE | Admit: 2016-04-30 | Discharge: 2016-04-30 | Disposition: A | Discharge status: Home / Self Care | Payer: Medicare Other | Source: Ambulatory Visit | Attending: Plastic Surgery | Admitting: Plastic Surgery

## 2016-04-30 DIAGNOSIS — Z96652 Presence of left artificial knee joint: Secondary | ICD-10-CM | POA: Diagnosis not present

## 2016-04-30 DIAGNOSIS — Z87891 Personal history of nicotine dependence: Secondary | ICD-10-CM | POA: Diagnosis not present

## 2016-04-30 DIAGNOSIS — I10 Essential (primary) hypertension: Secondary | ICD-10-CM | POA: Insufficient documentation

## 2016-04-30 DIAGNOSIS — Z7982 Long term (current) use of aspirin: Secondary | ICD-10-CM | POA: Insufficient documentation

## 2016-04-30 DIAGNOSIS — Z951 Presence of aortocoronary bypass graft: Secondary | ICD-10-CM | POA: Diagnosis not present

## 2016-04-30 DIAGNOSIS — E785 Hyperlipidemia, unspecified: Secondary | ICD-10-CM | POA: Diagnosis not present

## 2016-04-30 DIAGNOSIS — Z01812 Encounter for preprocedural laboratory examination: Secondary | ICD-10-CM | POA: Insufficient documentation

## 2016-04-30 DIAGNOSIS — I739 Peripheral vascular disease, unspecified: Secondary | ICD-10-CM | POA: Diagnosis not present

## 2016-04-30 DIAGNOSIS — Z79899 Other long term (current) drug therapy: Secondary | ICD-10-CM | POA: Diagnosis not present

## 2016-04-30 DIAGNOSIS — I2510 Atherosclerotic heart disease of native coronary artery without angina pectoris: Secondary | ICD-10-CM | POA: Diagnosis not present

## 2016-04-30 DIAGNOSIS — Z01818 Encounter for other preprocedural examination: Secondary | ICD-10-CM | POA: Insufficient documentation

## 2016-04-30 DIAGNOSIS — Z8673 Personal history of transient ischemic attack (TIA), and cerebral infarction without residual deficits: Secondary | ICD-10-CM | POA: Diagnosis not present

## 2016-04-30 DIAGNOSIS — Z950 Presence of cardiac pacemaker: Secondary | ICD-10-CM | POA: Diagnosis not present

## 2016-04-30 DIAGNOSIS — L97929 Non-pressure chronic ulcer of unspecified part of left lower leg with unspecified severity: Secondary | ICD-10-CM | POA: Insufficient documentation

## 2016-04-30 HISTORY — DX: Presence of cardiac pacemaker: Z95.0

## 2016-04-30 LAB — BASIC METABOLIC PANEL
Anion gap: 11 (ref 5–15)
BUN: 13 mg/dL (ref 6–20)
CO2: 23 mmol/L (ref 22–32)
Calcium: 9.8 mg/dL (ref 8.9–10.3)
Chloride: 103 mmol/L (ref 101–111)
Creatinine, Ser: 0.79 mg/dL (ref 0.61–1.24)
GFR calc Af Amer: >60 mL/min (ref >60)
GFR calc non Af Amer: >60 mL/min (ref >60)
Glucose, Bld: 95 mg/dL (ref 65–99)
Potassium: 3.7 mmol/L (ref 3.5–5.1)
Sodium: 137 mmol/L (ref 135–145)

## 2016-04-30 LAB — CBC
HEMATOCRIT: 41.8 % (ref 39.0–52.0)
HEMOGLOBIN: 13.3 g/dL (ref 13.0–17.0)
MCH: 29.2 pg (ref 26.0–34.0)
MCHC: 31.8 g/dL (ref 30.0–36.0)
MCV: 91.7 fL (ref 78.0–100.0)
Platelets: 186 10*3/uL (ref 150–400)
RBC: 4.56 MIL/uL (ref 4.22–5.81)
RDW: 14.3 % (ref 11.5–15.5)
WBC: 7.3 10*3/uL (ref 4.0–10.5)

## 2016-04-30 NOTE — Progress Notes (Signed)
Anesthesia Note: Patient is a 82 year old male scheduled for surgical prep for grafting left leg with Thera Skin application on 03/05/40 by Dr. Iran Planas. Anesthesia type is posted for MAC. DX: Non-pressure ulcer of the left leg. He suffered leg trauma from a car door on 12/23/15. He has been seeing Dr. Con Memos at a wound center, had a wound VAC for several weeks, and is now getting weekly dressings with Silvadene (?). Was seen by general surgeon Dr. Rosana Hoes and most recently referred to plastic surgery.   History includes former smoker (quit '54), CAD s/p 6V CABG 04/1998 (Dr. Roxan Hockey), post-op PAF (0.9% afib on 10/16/15 PPM interrogation notation), murmur (moderate AS, mild AR 05/22/14), SSS/CHB s/p Medtronic PPM (implant 10/16/04; insert new RV lead and generator change 03/23/10), HTN, dyslipidemia, ulcerative colitis, PVD, CVA (right hemispheric) '01, carotid occlusive disease s/p right CEA 07/13/00 (Dr. Drucie Opitz), anemia, s/p TURP '98, left TKA '10.   Patient's daughter [name] is a long time Software engineer at [employer]. She recently became a Teacher, English as a foreign language for [employer].  PCP is Dr. Lajean Manes. GI is Dr. Cristina Gong.  Primary cardiologist is Dr. Candee Furbish, last visit 07/31/15. Next visit scheduled 05/10/16. No syncope or angina at his last visit. He was going to the gym almost daily. One year follow-up recommended.  EP cardiologist is Dr. Crissie Sickles, last visit 01/21/16. He had no anginal symptoms at that time. Normal pacer function. Historically, he has been reluctant to take any anticoagulation other than aspirin due to his ulcerative colitis. Due to afib/PAF on recent pacer interrogation with elevated CHADS VASC score, Dr. Lovena Le again discussed starting Eliquis (and hold PRN ulcerative colitis flair). Patient did get a prescription but due to LLE injury/wound care, it has not been started. He will reconsider once his LLE wound is better healed. He is continuing ASA, even perioperatively per Dr. Maylene Roes.   BP  (!) 163/68   Pulse 81   Temp 36.5 C (Oral)   Resp 18   Ht 5' 11"  (1.803 m)   Wt 182 lb 1 oz (82.6 kg)   SpO2 100%   BMI 25.39 kg/m   On exam, he is a pleasant Caucasian male in NAD. He walks with a cane (for the past five years), but once his wound VAC was discontinued he has still been able to go the the gym nearly daily for 1 1/2 hours. He does aerobic at strength training exercises. He denied chest pain, SOB, syncope, presyncope, palpitations. He does have LLE edema since his injury. No significant edema in the RLE. Heart RRR, II-III/SEM that radiates to neck. Left sided PPM. Lungs clear. Mouth opening okay. LLE dressing.  Meds include amlodipine, aspirin 81 mg, Lipitor, Lexapro, finasteride, Lasix, balsalazide, Xalatan ophthalmic, losartan, Toprol-XL XL, KCl.  01/21/16 EKG: AV sequential or dual chamber electronic pacemaker.  05/22/14 Echo: Study Conclusions - Left ventricle: The cavity size was normal. There was mild focal basal hypertrophy of the septum. Systolic function was normal.The estimated ejection fraction was in the range of 60% to 65%. Wall motion was normal; there were no regional wall motionabnormalities. Doppler parameters are consistent with abnormal left ventricular relaxation (grade 1 diastolic dysfunction). - Aortic valve: There was moderate stenosis. There was mild regurgitation. Peak velocity (S): 348 cm/s. Mean gradient (S): 81m Hg. Peak gradient (S): 48 mm Hg. [VTI ratio of LVOT to aortic valve: 0.33. Valve area (VTI): 1.13 cm^2. Indexed valve area (VTI): 0.53 cm^2/m^2. Peak velocity ratio of LVOT to aortic valve: 0.27.  Valve area (Vmax): 0.94 cm^2. Indexed valve area (Vmax): 0.44 cm^2/m^2. Mean velocity ratio of LVOT to aortic valve: 0.26. Valve area (Vmean): 0.92 cm^2. Indexed valve area (Vmean): 0.43 cm^2/m^2.  Mean gradient (S): 32 mm Hg. Peak gradient (S): 48 mm Hg.] - Mitral valve: Calcified annulus. - Right ventricle: The cavity size was mildly dilated. Wall  thickness was normal.  06/03/09 Nuclear stress test: Normal stress nuclear study. No ischemia or scar. Good LV systolic function. EF 62%.  02/18/12 Carotid U/S: Conclusions: There is an estimated 20-30% stenosis of the bilateral ICAs.  Preoperative labs noted. BMET and CBC WNL.   He reports that Dr. Iran Planas discussed possible need to stay overnight due to age, co-morbidities--will see how he does in PACU. She also told him that she anticipated the procedure could be done under [place].  Reviewed with anesthesiologist Dr. Conrad Channelview. Patient asymptomatic from a CV standpoint. He is going to the gym regularly. Moderate AS by echo in the last two years, but no chest pain, dizziness, syncope, SOB. He is compliant with cardiology follow-up. Further evaluation on the day of surgery, but if he remains asymptomatic from a CV/CHF standpoint then it is anticipated that he can proceed as planned. PPM perioperative device form is pending from [facility].  George [facility] Stay [facility]/Anesthesiology Phone [phone] 04/30/2016 1:43 PM

## 2016-04-30 NOTE — Progress Notes (Signed)
PCP: Dr. Felipa Eth Cardiologist: Dr. Marlou Porch EP Cardiologist: Dr. Crissie Sickles  Called Dr. Para Skeans office for instructions about aspirin. Katie, her nurse stated pt. Did not have to stop it per Dr. Iran Planas .  Cardiac device form fax to device clinic

## 2016-05-04 ENCOUNTER — Encounter (HOSPITAL_COMMUNITY)
Admission: RE | Disposition: A | Discharge status: Home / Self Care | Payer: Self-pay | Source: Ambulatory Visit | Attending: Plastic Surgery

## 2016-05-04 ENCOUNTER — Ambulatory Visit (HOSPITAL_COMMUNITY): Payer: Medicare Other | Admitting: Certified Registered Nurse Anesthetist

## 2016-05-04 ENCOUNTER — Ambulatory Visit (HOSPITAL_BASED_OUTPATIENT_CLINIC_OR_DEPARTMENT_OTHER)
Admission: RE | Admit: 2016-05-04 | Discharge: 2016-05-04 | Disposition: A | Discharge status: Home / Self Care | Payer: Medicare Other | Source: Ambulatory Visit | Attending: Plastic Surgery | Admitting: Plastic Surgery

## 2016-05-04 ENCOUNTER — Ambulatory Visit (HOSPITAL_COMMUNITY): Payer: Medicare Other | Admitting: Vascular Surgery

## 2016-05-04 ENCOUNTER — Encounter (HOSPITAL_COMMUNITY): Payer: Self-pay | Admitting: *Deleted

## 2016-05-04 DIAGNOSIS — L97929 Non-pressure chronic ulcer of unspecified part of left lower leg with unspecified severity: Secondary | ICD-10-CM | POA: Diagnosis not present

## 2016-05-04 DIAGNOSIS — Z950 Presence of cardiac pacemaker: Secondary | ICD-10-CM | POA: Diagnosis not present

## 2016-05-04 DIAGNOSIS — Z87891 Personal history of nicotine dependence: Secondary | ICD-10-CM | POA: Diagnosis not present

## 2016-05-04 DIAGNOSIS — I4891 Unspecified atrial fibrillation: Secondary | ICD-10-CM | POA: Diagnosis not present

## 2016-05-04 DIAGNOSIS — X58XXXA Exposure to other specified factors, initial encounter: Secondary | ICD-10-CM | POA: Insufficient documentation

## 2016-05-04 DIAGNOSIS — L97821 Non-pressure chronic ulcer of other part of left lower leg limited to breakdown of skin: Secondary | ICD-10-CM | POA: Diagnosis not present

## 2016-05-04 DIAGNOSIS — Z951 Presence of aortocoronary bypass graft: Secondary | ICD-10-CM | POA: Insufficient documentation

## 2016-05-04 DIAGNOSIS — I2510 Atherosclerotic heart disease of native coronary artery without angina pectoris: Secondary | ICD-10-CM | POA: Insufficient documentation

## 2016-05-04 DIAGNOSIS — Z79899 Other long term (current) drug therapy: Secondary | ICD-10-CM | POA: Insufficient documentation

## 2016-05-04 DIAGNOSIS — S81802A Unspecified open wound, left lower leg, initial encounter: Secondary | ICD-10-CM | POA: Diagnosis not present

## 2016-05-04 DIAGNOSIS — I10 Essential (primary) hypertension: Secondary | ICD-10-CM | POA: Insufficient documentation

## 2016-05-04 DIAGNOSIS — K5190 Ulcerative colitis, unspecified, without complications: Secondary | ICD-10-CM | POA: Insufficient documentation

## 2016-05-04 DIAGNOSIS — I739 Peripheral vascular disease, unspecified: Secondary | ICD-10-CM | POA: Diagnosis not present

## 2016-05-04 DIAGNOSIS — Z8673 Personal history of transient ischemic attack (TIA), and cerebral infarction without residual deficits: Secondary | ICD-10-CM | POA: Diagnosis not present

## 2016-05-04 DIAGNOSIS — M1990 Unspecified osteoarthritis, unspecified site: Secondary | ICD-10-CM | POA: Diagnosis not present

## 2016-05-04 HISTORY — PX: INCISION AND DRAINAGE OF WOUND: SHX1803

## 2016-05-04 LAB — CUP PACEART REMOTE DEVICE CHECK
Battery Impedance: 397 Ohm
Battery Remaining Longevity: 92 mo
Brady Statistic AP VS Percent: 0 %
Brady Statistic AS VS Percent: 2 %
Implantable Lead Implant Date: 20110725
Implantable Lead Location: 753859
Lead Channel Impedance Value: 508 Ohm
Lead Channel Impedance Value: 649 Ohm
Lead Channel Pacing Threshold Amplitude: 0.625 V
Lead Channel Pacing Threshold Pulse Width: 0.4 ms
Lead Channel Setting Pacing Amplitude: 2 V
Lead Channel Setting Pacing Pulse Width: 0.4 ms
Lead Channel Setting Sensing Sensitivity: 2 mV
MDC IDC LEAD IMPLANT DT: 20110725
MDC IDC LEAD LOCATION: 753860
MDC IDC MSMT BATTERY VOLTAGE: 2.79 V
MDC IDC MSMT LEADCHNL RA PACING THRESHOLD AMPLITUDE: 0.375 V
MDC IDC MSMT LEADCHNL RA PACING THRESHOLD PULSEWIDTH: 0.4 ms
MDC IDC MSMT LEADCHNL RA SENSING INTR AMPL: 1 mV
MDC IDC SESS DTM: 20170823165858
MDC IDC SET LEADCHNL RV PACING AMPLITUDE: 2.5 V
MDC IDC STAT BRADY AP VP PERCENT: 40 %
MDC IDC STAT BRADY AS VP PERCENT: 58 %

## 2016-05-04 SURGERY — IRRIGATION AND DEBRIDEMENT WOUND
Anesthesia: Monitor Anesthesia Care | Site: Leg Lower | Laterality: Left

## 2016-05-04 MED ORDER — FENTANYL CITRATE (PF) 100 MCG/2ML IJ SOLN
INTRAMUSCULAR | Status: AC
  Filled 2016-05-04: qty 2

## 2016-05-04 MED ORDER — BUPIVACAINE HCL (PF) 0.5 % IJ SOLN
INTRAMUSCULAR | Status: AC
  Filled 2016-05-04: qty 30

## 2016-05-04 MED ORDER — PHENYLEPHRINE HCL 10 MG/ML IJ SOLN
INTRAMUSCULAR | Status: DC | PRN
  Administered 2016-05-04: 120 ug via INTRAVENOUS

## 2016-05-04 MED ORDER — LACTATED RINGERS IV SOLN
INTRAVENOUS | Status: DC
  Administered 2016-05-04: 14:00:00 via INTRAVENOUS

## 2016-05-04 MED ORDER — ACETAMINOPHEN 160 MG/5ML PO SOLN
325.0000 mg | ORAL | Status: DC | PRN
  Filled 2016-05-04: qty 20.3

## 2016-05-04 MED ORDER — CEFAZOLIN SODIUM-DEXTROSE 2-4 GM/100ML-% IV SOLN
2.0000 g | INTRAVENOUS | Status: AC
  Administered 2016-05-04: 2 g via INTRAVENOUS
  Filled 2016-05-04: qty 100

## 2016-05-04 MED ORDER — PROPOFOL 500 MG/50ML IV EMUL
INTRAVENOUS | Status: DC | PRN
  Administered 2016-05-04: 70 ug/kg/min via INTRAVENOUS

## 2016-05-04 MED ORDER — 0.9 % SODIUM CHLORIDE (POUR BTL) OPTIME
TOPICAL | Status: DC | PRN
  Administered 2016-05-04: 1000 mL

## 2016-05-04 MED ORDER — OXYCODONE HCL 5 MG PO TABS: 5.0000 mg | ORAL_TABLET | Freq: Once | ORAL | Status: DC | PRN

## 2016-05-04 MED ORDER — ACETAMINOPHEN 325 MG PO TABS: 325.0000 mg | ORAL_TABLET | ORAL | Status: DC | PRN

## 2016-05-04 MED ORDER — PROPOFOL 10 MG/ML IV BOLUS
INTRAVENOUS | Status: AC
  Filled 2016-05-04: qty 20

## 2016-05-04 MED ORDER — PROPOFOL 10 MG/ML IV BOLUS
INTRAVENOUS | Status: DC | PRN
  Administered 2016-05-04: 30 mg via INTRAVENOUS

## 2016-05-04 MED ORDER — FENTANYL CITRATE (PF) 100 MCG/2ML IJ SOLN: 25.0000 ug | INTRAMUSCULAR | Status: DC | PRN

## 2016-05-04 MED ORDER — OXYCODONE HCL 5 MG/5ML PO SOLN: 5.0000 mg | Freq: Once | ORAL | Status: DC | PRN

## 2016-05-04 MED ORDER — FENTANYL CITRATE (PF) 100 MCG/2ML IJ SOLN
INTRAMUSCULAR | Status: DC | PRN
  Administered 2016-05-04 (×4): 25 ug via INTRAVENOUS

## 2016-05-04 MED ORDER — HYDROCODONE-ACETAMINOPHEN 5-325 MG PO TABS
1.0000 | ORAL_TABLET | Freq: Four times a day (QID) | ORAL | 0 refills | Status: DC | PRN
Start: 2016-05-04 — End: 2016-10-06

## 2016-05-04 MED ORDER — BUPIVACAINE HCL (PF) 0.5 % IJ SOLN
INTRAMUSCULAR | Status: DC | PRN
  Administered 2016-05-04: 5.5 mL

## 2016-05-04 MED ORDER — GENTAMICIN SULFATE 40 MG/ML IJ SOLN
INTRAMUSCULAR | Status: DC
  Filled 2016-05-04: qty 1

## 2016-05-04 SURGICAL SUPPLY — 59 items
APL SKNCLS STERI-STRIP NONHPOA (GAUZE/BANDAGES/DRESSINGS) ×1
BAG DECANTER FOR FLEXI CONT (MISCELLANEOUS) IMPLANT
BENZOIN TINCTURE PRP APPL 2/3 (GAUZE/BANDAGES/DRESSINGS) ×2 IMPLANT
BLADE 10 SAFETY STRL DISP (BLADE) ×3 IMPLANT
BLADE SURG ROTATE 9660 (MISCELLANEOUS) IMPLANT
BNDG GAUZE ELAST 4 BULKY (GAUZE/BANDAGES/DRESSINGS) IMPLANT
CANISTER SUCTION 2500CC (MISCELLANEOUS) ×3 IMPLANT
CHLORAPREP W/TINT 26ML (MISCELLANEOUS) IMPLANT
CLOSURE STERI-STRIP 1/2X4 (GAUZE/BANDAGES/DRESSINGS) ×1
CLSR STERI-STRIP ANTIMIC 1/2X4 (GAUZE/BANDAGES/DRESSINGS) ×1 IMPLANT
CONT SPEC STER OR (MISCELLANEOUS) IMPLANT
COVER SURGICAL LIGHT HANDLE (MISCELLANEOUS) ×3 IMPLANT
DECANTER SPIKE VIAL GLASS SM (MISCELLANEOUS) ×2 IMPLANT
DRAPE HALF SHEET 40X57 (DRAPES) ×2 IMPLANT
DRAPE IMP U-DRAPE 54X76 (DRAPES) ×1 IMPLANT
DRAPE INCISE IOBAN 66X45 STRL (DRAPES) IMPLANT
DRAPE PED LAPAROTOMY (DRAPES) ×3 IMPLANT
DRAPE PROXIMA HALF (DRAPES) IMPLANT
DRSG ADAPTIC 3X8 NADH LF (GAUZE/BANDAGES/DRESSINGS) ×2 IMPLANT
DRSG KUZMA FLUFF (GAUZE/BANDAGES/DRESSINGS) ×2 IMPLANT
DRSG PAD ABDOMINAL 8X10 ST (GAUZE/BANDAGES/DRESSINGS) ×3 IMPLANT
DRSG VAC ATS LRG SENSATRAC (GAUZE/BANDAGES/DRESSINGS) IMPLANT
DRSG VAC ATS MED SENSATRAC (GAUZE/BANDAGES/DRESSINGS) IMPLANT
DRSG VAC ATS SM SENSATRAC (GAUZE/BANDAGES/DRESSINGS) IMPLANT
ELECT CAUTERY BLADE 6.4 (BLADE) ×1 IMPLANT
ELECT REM PT RETURN 9FT ADLT (ELECTROSURGICAL)
ELECTRODE REM PT RTRN 9FT ADLT (ELECTROSURGICAL) ×1 IMPLANT
GAUZE SPONGE 4X4 12PLY STRL (GAUZE/BANDAGES/DRESSINGS) ×3 IMPLANT
GLOVE BIO SURGEON STRL SZ 6 (GLOVE) ×9 IMPLANT
GLOVE BIO SURGEON STRL SZ7.5 (GLOVE) ×2 IMPLANT
GLOVE BIOGEL PI IND STRL 6.5 (GLOVE) ×1 IMPLANT
GLOVE BIOGEL PI IND STRL 7.5 (GLOVE) IMPLANT
GLOVE BIOGEL PI INDICATOR 6.5 (GLOVE) ×2
GLOVE BIOGEL PI INDICATOR 7.5 (GLOVE) ×4
GLOVE ECLIPSE 8.0 STRL XLNG CF (GLOVE) ×3 IMPLANT
GLOVE SURG SS PI 6.0 STRL IVOR (GLOVE) ×3 IMPLANT
GOWN STRL REUS W/ TWL LRG LVL3 (GOWN DISPOSABLE) ×2 IMPLANT
GOWN STRL REUS W/TWL LRG LVL3 (GOWN DISPOSABLE) ×9
HANDPIECE INTERPULSE COAX TIP (DISPOSABLE)
KIT BASIN OR (CUSTOM PROCEDURE TRAY) ×3 IMPLANT
KIT ROOM TURNOVER OR (KITS) ×3 IMPLANT
NDL HYPO 25GX1X1/2 BEV (NEEDLE) IMPLANT
NEEDLE HYPO 25GX1X1/2 BEV (NEEDLE) ×3 IMPLANT
NS IRRIG 1000ML POUR BTL (IV SOLUTION) ×3 IMPLANT
PACK GENERAL/GYN (CUSTOM PROCEDURE TRAY) ×3 IMPLANT
PACK UNIVERSAL I (CUSTOM PROCEDURE TRAY) ×3 IMPLANT
PAD ABD 8X10 STRL (GAUZE/BANDAGES/DRESSINGS) ×2 IMPLANT
PAD ARMBOARD 7.5X6 YLW CONV (MISCELLANEOUS) ×6 IMPLANT
SET HNDPC FAN SPRY TIP SCT (DISPOSABLE) IMPLANT
SURGILUBE 2OZ TUBE FLIPTOP (MISCELLANEOUS) IMPLANT
SUT CHROMIC 4 0 PS 2 18 (SUTURE) ×6 IMPLANT
SUT VIC AB 5-0 PS2 18 (SUTURE) IMPLANT
SWAB COLLECTION DEVICE MRSA (MISCELLANEOUS) IMPLANT
SYRINGE 10CC LL (SYRINGE) ×2 IMPLANT
TISSUE THERASKIN 2X3 (Tissue) ×3 IMPLANT
TOWEL OR 17X24 6PK STRL BLUE (TOWEL DISPOSABLE) ×3 IMPLANT
TOWEL OR 17X26 10 PK STRL BLUE (TOWEL DISPOSABLE) ×3 IMPLANT
TUBE ANAEROBIC SPECIMEN COL (MISCELLANEOUS) IMPLANT
UNDERPAD 30X30 (UNDERPADS AND DIAPERS) ×3 IMPLANT

## 2016-05-04 NOTE — Transfer of Care (Signed)
Immediate Anesthesia Transfer of Care Note  Patient: Dalton Townsend  Procedure(s) Performed: Procedure(s): SURGICAL PREP FOR GRAFTING LEFT LEG WITH THERA SKIN APPLICATION (Left)  Patient Location: PACU  Anesthesia Type:MAC  Level of Consciousness: awake, alert , oriented and patient cooperative  Airway & Oxygen Therapy: Patient Spontanous Breathing and Patient connected to nasal cannula oxygen  Post-op Assessment: Report given to RN and Post -op Vital signs reviewed and stable  Post vital signs: Reviewed and stable  Last Vitals:  Vitals:   05/04/16 1237 05/04/16 1717  BP: (!) 167/85 (!) 148/80  Pulse: 79 62  Resp: 18 18  Temp: 37 C     Last Pain:  Vitals:   05/04/16 1237  TempSrc: Oral         Complications: No apparent anesthesia complications

## 2016-05-04 NOTE — Op Note (Signed)
Operative Note   DATE OF OPERATION: 9.5.17  LOCATION: [facility]- outpatient  SURGICAL DIVISION: Plastic Surgery  PREOPERATIVE DIAGNOSES:  1. Non pressure ulcer left leg  POSTOPERATIVE DIAGNOSES:  same  PROCEDURE:  1. Surgical preparation for grafting left leg 30 cm2 2. Application Theraskin 35 cm2  SURGEON: Irene Limbo MD MBA  ASSISTANT: none  ANESTHESIA:  MAC.   EBL: minimal  COMPLICATIONS: None immediate.   INDICATIONS FOR PROCEDURE:  The patient, Dalton Townsend, is a 82 y.o. male born on [birth year], is here for application skin substitute for chronic open wound left leg incurred due to trauma.   FINDINGS: Open wound left leg 11 x 3.5 cm in greatest dimension with hypergranulation tissue present.  DESCRIPTION OF PROCEDURE:  The patient's operative site was marked with the patient in the preoperative area. The patient was taken to the operating room. SCDs were placed and IV antibiotics were given. The patient's operative site was prepped and draped in a sterile fashion. A time out was performed and all information was confirmed to be correct. Local anesthetic infiltrated surrounding wound margin. Wound prepared by curretage to remove hypergranulation tissue and slough over its entirety. Wound irrigated and hemostasis obtained with pressure. Theraskin prepared and inset over wound with interrupted 4-0 chromic, total 35 cm2. Adaptic applied over Theraskin and secured to skin with steri strips. Dry dressing, Kerlix and Ace wrap applied.   The patient was allowed to wake from anesthesia, extubated and taken to the recovery room in satisfactory condition.   SPECIMENS: none  DRAINS: none  Irene Limbo, MD [facility] [phone], pin [phone]

## 2016-05-04 NOTE — Interval H&P Note (Signed)
History and Physical Interval Note:  05/04/2016 2:59 PM  Dalton Townsend  has presented today for surgery, with the diagnosis of nonpressure ulcer of the left leg  The various methods of treatment have been discussed with the patient and family. After consideration of risks, benefits and other options for treatment, the patient has consented to  Procedure(s): SURGICAL PREP FOR GRAFTING LEFT LEG WITH THERA SKIN APPLICATION (Left) as a surgical intervention .  The patient's history has been reviewed, patient examined, no change in status, stable for surgery.  I have reviewed the patient's chart and labs.  Questions were answered to the patient's satisfaction.     [name]

## 2016-05-04 NOTE — Anesthesia Preprocedure Evaluation (Signed)
Anesthesia Evaluation  Patient identified by MRN, date of birth, ID band Patient awake    Reviewed: Allergy & Precautions, NPO status , Patient's Chart, lab work & pertinent test results  History of Anesthesia Complications Negative for: history of anesthetic complications  Airway Mallampati: II  TM Distance: >3 FB Neck ROM: Full    Dental  (+) Teeth Intact   Pulmonary neg shortness of breath, neg sleep apnea, neg COPD, neg recent URI, former smoker,    breath sounds clear to auscultation       Cardiovascular hypertension, Pt. on medications and Pt. on home beta blockers + CAD, + CABG and + Peripheral Vascular Disease  + dysrhythmias + pacemaker  Rhythm:Regular     Neuro/Psych Anxiety TIACVA    GI/Hepatic Neg liver ROS,   Endo/Other  negative endocrine ROS  Renal/GU negative Renal ROS     Musculoskeletal  (+) Arthritis ,   Abdominal   Peds  Hematology negative hematology ROS (+)   Anesthesia Other Findings   Reproductive/Obstetrics                             Anesthesia Physical Anesthesia Plan  ASA: III  Anesthesia Plan: MAC   Post-op Pain Management:    Induction: Intravenous  Airway Management Planned: Nasal Cannula, Natural Airway and Simple Face Mask  Additional Equipment: None  Intra-op Plan:   Post-operative Plan:   Informed Consent: I have reviewed the patients History and Physical, chart, labs and discussed the procedure including the risks, benefits and alternatives for the proposed anesthesia with the patient or authorized representative who has indicated his/her understanding and acceptance.   Dental advisory given  Plan Discussed with: CRNA and Surgeon  Anesthesia Plan Comments:         Anesthesia Quick Evaluation

## 2016-05-05 ENCOUNTER — Encounter (HOSPITAL_COMMUNITY): Payer: Self-pay | Admitting: Plastic Surgery

## 2016-05-10 ENCOUNTER — Ambulatory Visit: Payer: Medicare Other | Admitting: Cardiology

## 2016-05-10 DIAGNOSIS — Z8673 Personal history of transient ischemic attack (TIA), and cerebral infarction without residual deficits: Secondary | ICD-10-CM | POA: Diagnosis not present

## 2016-05-10 DIAGNOSIS — I10 Essential (primary) hypertension: Secondary | ICD-10-CM | POA: Diagnosis not present

## 2016-05-10 DIAGNOSIS — I2510 Atherosclerotic heart disease of native coronary artery without angina pectoris: Secondary | ICD-10-CM | POA: Diagnosis not present

## 2016-05-10 DIAGNOSIS — Z950 Presence of cardiac pacemaker: Secondary | ICD-10-CM | POA: Diagnosis not present

## 2016-05-10 DIAGNOSIS — S81802D Unspecified open wound, left lower leg, subsequent encounter: Secondary | ICD-10-CM | POA: Diagnosis not present

## 2016-05-10 NOTE — Anesthesia Postprocedure Evaluation (Signed)
Anesthesia Post Note  Patient: Dalton Townsend  Procedure(s) Performed: Procedure(s) (LRB): SURGICAL PREP FOR GRAFTING LEFT LEG WITH THERA SKIN APPLICATION (Left)  Patient location during evaluation: PACU Anesthesia Type: MAC Level of consciousness: awake Pain management: pain level controlled Vital Signs Assessment: post-procedure vital signs reviewed and stable Respiratory status: spontaneous breathing Cardiovascular status: stable Postop Assessment: no signs of nausea or vomiting Anesthetic complications: no    Last Vitals:  Vitals:   05/04/16 1730 05/04/16 1745  BP: (!) 158/72 (!) 166/81  Pulse: 60 60  Resp: 16 13  Temp:  36.6 C    Last Pain:  Vitals:   05/04/16 1237  TempSrc: Oral                 [name]

## 2016-05-12 ENCOUNTER — Encounter: Payer: Self-pay | Admitting: Cardiology

## 2016-05-19 ENCOUNTER — Encounter (HOSPITAL_BASED_OUTPATIENT_CLINIC_OR_DEPARTMENT_OTHER): Payer: Medicare Other | Attending: Surgery

## 2016-05-19 DIAGNOSIS — I872 Venous insufficiency (chronic) (peripheral): Secondary | ICD-10-CM | POA: Diagnosis not present

## 2016-05-19 DIAGNOSIS — Z951 Presence of aortocoronary bypass graft: Secondary | ICD-10-CM | POA: Insufficient documentation

## 2016-05-19 DIAGNOSIS — Z950 Presence of cardiac pacemaker: Secondary | ICD-10-CM | POA: Insufficient documentation

## 2016-05-19 DIAGNOSIS — Z96659 Presence of unspecified artificial knee joint: Secondary | ICD-10-CM | POA: Insufficient documentation

## 2016-05-19 DIAGNOSIS — I10 Essential (primary) hypertension: Secondary | ICD-10-CM | POA: Diagnosis not present

## 2016-05-19 DIAGNOSIS — L97821 Non-pressure chronic ulcer of other part of left lower leg limited to breakdown of skin: Secondary | ICD-10-CM | POA: Diagnosis not present

## 2016-05-19 DIAGNOSIS — Z8673 Personal history of transient ischemic attack (TIA), and cerebral infarction without residual deficits: Secondary | ICD-10-CM | POA: Diagnosis not present

## 2016-05-19 DIAGNOSIS — M70862 Other soft tissue disorders related to use, overuse and pressure, left lower leg: Secondary | ICD-10-CM | POA: Diagnosis not present

## 2016-05-19 DIAGNOSIS — I2510 Atherosclerotic heart disease of native coronary artery without angina pectoris: Secondary | ICD-10-CM | POA: Insufficient documentation

## 2016-05-19 DIAGNOSIS — L97822 Non-pressure chronic ulcer of other part of left lower leg with fat layer exposed: Secondary | ICD-10-CM | POA: Diagnosis not present

## 2016-05-26 DIAGNOSIS — Z951 Presence of aortocoronary bypass graft: Secondary | ICD-10-CM | POA: Diagnosis not present

## 2016-05-26 DIAGNOSIS — I2510 Atherosclerotic heart disease of native coronary artery without angina pectoris: Secondary | ICD-10-CM | POA: Diagnosis not present

## 2016-05-26 DIAGNOSIS — I872 Venous insufficiency (chronic) (peripheral): Secondary | ICD-10-CM | POA: Diagnosis not present

## 2016-05-26 DIAGNOSIS — I10 Essential (primary) hypertension: Secondary | ICD-10-CM | POA: Diagnosis not present

## 2016-05-26 DIAGNOSIS — L97821 Non-pressure chronic ulcer of other part of left lower leg limited to breakdown of skin: Secondary | ICD-10-CM | POA: Diagnosis not present

## 2016-05-26 DIAGNOSIS — M70862 Other soft tissue disorders related to use, overuse and pressure, left lower leg: Secondary | ICD-10-CM | POA: Diagnosis not present

## 2016-05-26 DIAGNOSIS — Z950 Presence of cardiac pacemaker: Secondary | ICD-10-CM | POA: Diagnosis not present

## 2016-05-26 DIAGNOSIS — Z8673 Personal history of transient ischemic attack (TIA), and cerebral infarction without residual deficits: Secondary | ICD-10-CM | POA: Diagnosis not present

## 2016-06-01 DIAGNOSIS — H401122 Primary open-angle glaucoma, left eye, moderate stage: Secondary | ICD-10-CM | POA: Diagnosis not present

## 2016-06-01 DIAGNOSIS — H401111 Primary open-angle glaucoma, right eye, mild stage: Secondary | ICD-10-CM | POA: Diagnosis not present

## 2016-06-01 DIAGNOSIS — H524 Presbyopia: Secondary | ICD-10-CM | POA: Diagnosis not present

## 2016-06-02 ENCOUNTER — Encounter (HOSPITAL_BASED_OUTPATIENT_CLINIC_OR_DEPARTMENT_OTHER): Payer: Medicare Other | Attending: Surgery

## 2016-06-02 DIAGNOSIS — Z951 Presence of aortocoronary bypass graft: Secondary | ICD-10-CM | POA: Diagnosis not present

## 2016-06-02 DIAGNOSIS — H409 Unspecified glaucoma: Secondary | ICD-10-CM | POA: Diagnosis not present

## 2016-06-02 DIAGNOSIS — L97821 Non-pressure chronic ulcer of other part of left lower leg limited to breakdown of skin: Secondary | ICD-10-CM | POA: Diagnosis not present

## 2016-06-02 DIAGNOSIS — Z96659 Presence of unspecified artificial knee joint: Secondary | ICD-10-CM | POA: Insufficient documentation

## 2016-06-02 DIAGNOSIS — I739 Peripheral vascular disease, unspecified: Secondary | ICD-10-CM | POA: Diagnosis not present

## 2016-06-02 DIAGNOSIS — I10 Essential (primary) hypertension: Secondary | ICD-10-CM | POA: Insufficient documentation

## 2016-06-02 DIAGNOSIS — Z950 Presence of cardiac pacemaker: Secondary | ICD-10-CM | POA: Diagnosis not present

## 2016-06-02 DIAGNOSIS — I2510 Atherosclerotic heart disease of native coronary artery without angina pectoris: Secondary | ICD-10-CM | POA: Insufficient documentation

## 2016-06-02 DIAGNOSIS — Z8673 Personal history of transient ischemic attack (TIA), and cerebral infarction without residual deficits: Secondary | ICD-10-CM | POA: Diagnosis not present

## 2016-06-02 DIAGNOSIS — Z87891 Personal history of nicotine dependence: Secondary | ICD-10-CM | POA: Insufficient documentation

## 2016-06-09 DIAGNOSIS — H409 Unspecified glaucoma: Secondary | ICD-10-CM | POA: Diagnosis not present

## 2016-06-09 DIAGNOSIS — Z950 Presence of cardiac pacemaker: Secondary | ICD-10-CM | POA: Diagnosis not present

## 2016-06-09 DIAGNOSIS — I739 Peripheral vascular disease, unspecified: Secondary | ICD-10-CM | POA: Diagnosis not present

## 2016-06-09 DIAGNOSIS — I2510 Atherosclerotic heart disease of native coronary artery without angina pectoris: Secondary | ICD-10-CM | POA: Diagnosis not present

## 2016-06-09 DIAGNOSIS — I87312 Chronic venous hypertension (idiopathic) with ulcer of left lower extremity: Secondary | ICD-10-CM | POA: Diagnosis not present

## 2016-06-09 DIAGNOSIS — L97821 Non-pressure chronic ulcer of other part of left lower leg limited to breakdown of skin: Secondary | ICD-10-CM | POA: Diagnosis not present

## 2016-06-09 DIAGNOSIS — I10 Essential (primary) hypertension: Secondary | ICD-10-CM | POA: Diagnosis not present

## 2016-06-09 DIAGNOSIS — L97223 Non-pressure chronic ulcer of left calf with necrosis of muscle: Secondary | ICD-10-CM | POA: Diagnosis not present

## 2016-06-14 DIAGNOSIS — F325 Major depressive disorder, single episode, in full remission: Secondary | ICD-10-CM | POA: Diagnosis not present

## 2016-06-14 DIAGNOSIS — Q253 Supravalvular aortic stenosis: Secondary | ICD-10-CM | POA: Diagnosis not present

## 2016-06-14 DIAGNOSIS — I480 Paroxysmal atrial fibrillation: Secondary | ICD-10-CM | POA: Diagnosis not present

## 2016-06-14 DIAGNOSIS — Z0000 Encounter for general adult medical examination without abnormal findings: Secondary | ICD-10-CM | POA: Diagnosis not present

## 2016-06-14 DIAGNOSIS — I10 Essential (primary) hypertension: Secondary | ICD-10-CM | POA: Diagnosis not present

## 2016-06-14 DIAGNOSIS — D692 Other nonthrombocytopenic purpura: Secondary | ICD-10-CM | POA: Diagnosis not present

## 2016-06-14 DIAGNOSIS — E7800 Pure hypercholesterolemia, unspecified: Secondary | ICD-10-CM | POA: Diagnosis not present

## 2016-06-14 DIAGNOSIS — Z79899 Other long term (current) drug therapy: Secondary | ICD-10-CM | POA: Diagnosis not present

## 2016-06-16 DIAGNOSIS — L97821 Non-pressure chronic ulcer of other part of left lower leg limited to breakdown of skin: Secondary | ICD-10-CM | POA: Diagnosis not present

## 2016-06-16 DIAGNOSIS — H409 Unspecified glaucoma: Secondary | ICD-10-CM | POA: Diagnosis not present

## 2016-06-16 DIAGNOSIS — Z950 Presence of cardiac pacemaker: Secondary | ICD-10-CM | POA: Diagnosis not present

## 2016-06-16 DIAGNOSIS — I2510 Atherosclerotic heart disease of native coronary artery without angina pectoris: Secondary | ICD-10-CM | POA: Diagnosis not present

## 2016-06-16 DIAGNOSIS — I10 Essential (primary) hypertension: Secondary | ICD-10-CM | POA: Diagnosis not present

## 2016-06-16 DIAGNOSIS — I739 Peripheral vascular disease, unspecified: Secondary | ICD-10-CM | POA: Diagnosis not present

## 2016-06-17 DIAGNOSIS — Z23 Encounter for immunization: Secondary | ICD-10-CM | POA: Diagnosis not present

## 2016-06-23 DIAGNOSIS — L97222 Non-pressure chronic ulcer of left calf with fat layer exposed: Secondary | ICD-10-CM | POA: Diagnosis not present

## 2016-06-23 DIAGNOSIS — I739 Peripheral vascular disease, unspecified: Secondary | ICD-10-CM | POA: Diagnosis not present

## 2016-06-23 DIAGNOSIS — I2510 Atherosclerotic heart disease of native coronary artery without angina pectoris: Secondary | ICD-10-CM | POA: Diagnosis not present

## 2016-06-23 DIAGNOSIS — H409 Unspecified glaucoma: Secondary | ICD-10-CM | POA: Diagnosis not present

## 2016-06-23 DIAGNOSIS — I10 Essential (primary) hypertension: Secondary | ICD-10-CM | POA: Diagnosis not present

## 2016-06-23 DIAGNOSIS — L97821 Non-pressure chronic ulcer of other part of left lower leg limited to breakdown of skin: Secondary | ICD-10-CM | POA: Diagnosis not present

## 2016-06-23 DIAGNOSIS — M70862 Other soft tissue disorders related to use, overuse and pressure, left lower leg: Secondary | ICD-10-CM | POA: Diagnosis not present

## 2016-06-23 DIAGNOSIS — Z950 Presence of cardiac pacemaker: Secondary | ICD-10-CM | POA: Diagnosis not present

## 2016-06-23 DIAGNOSIS — I87312 Chronic venous hypertension (idiopathic) with ulcer of left lower extremity: Secondary | ICD-10-CM | POA: Diagnosis not present

## 2016-06-30 ENCOUNTER — Encounter (HOSPITAL_BASED_OUTPATIENT_CLINIC_OR_DEPARTMENT_OTHER): Payer: Medicare Other | Attending: Surgery

## 2016-06-30 DIAGNOSIS — S81812A Laceration without foreign body, left lower leg, initial encounter: Secondary | ICD-10-CM | POA: Insufficient documentation

## 2016-06-30 DIAGNOSIS — I739 Peripheral vascular disease, unspecified: Secondary | ICD-10-CM | POA: Diagnosis not present

## 2016-06-30 DIAGNOSIS — M70862 Other soft tissue disorders related to use, overuse and pressure, left lower leg: Secondary | ICD-10-CM | POA: Diagnosis not present

## 2016-06-30 DIAGNOSIS — I87312 Chronic venous hypertension (idiopathic) with ulcer of left lower extremity: Secondary | ICD-10-CM | POA: Diagnosis not present

## 2016-06-30 DIAGNOSIS — X58XXXA Exposure to other specified factors, initial encounter: Secondary | ICD-10-CM | POA: Insufficient documentation

## 2016-06-30 DIAGNOSIS — L97223 Non-pressure chronic ulcer of left calf with necrosis of muscle: Secondary | ICD-10-CM | POA: Diagnosis not present

## 2016-06-30 DIAGNOSIS — I10 Essential (primary) hypertension: Secondary | ICD-10-CM | POA: Insufficient documentation

## 2016-06-30 DIAGNOSIS — I2510 Atherosclerotic heart disease of native coronary artery without angina pectoris: Secondary | ICD-10-CM | POA: Diagnosis not present

## 2016-07-07 DIAGNOSIS — M70862 Other soft tissue disorders related to use, overuse and pressure, left lower leg: Secondary | ICD-10-CM | POA: Diagnosis not present

## 2016-07-07 DIAGNOSIS — S81812A Laceration without foreign body, left lower leg, initial encounter: Secondary | ICD-10-CM | POA: Diagnosis not present

## 2016-07-07 DIAGNOSIS — I739 Peripheral vascular disease, unspecified: Secondary | ICD-10-CM | POA: Diagnosis not present

## 2016-07-07 DIAGNOSIS — I10 Essential (primary) hypertension: Secondary | ICD-10-CM | POA: Diagnosis not present

## 2016-07-07 DIAGNOSIS — I2510 Atherosclerotic heart disease of native coronary artery without angina pectoris: Secondary | ICD-10-CM | POA: Diagnosis not present

## 2016-07-07 DIAGNOSIS — L97223 Non-pressure chronic ulcer of left calf with necrosis of muscle: Secondary | ICD-10-CM | POA: Diagnosis not present

## 2016-07-09 ENCOUNTER — Encounter: Payer: Self-pay | Admitting: Cardiology

## 2016-07-09 ENCOUNTER — Ambulatory Visit (INDEPENDENT_AMBULATORY_CARE_PROVIDER_SITE_OTHER): Payer: Medicare Other | Admitting: Cardiology

## 2016-07-09 VITALS — BP 170/98 | HR 98 | Ht 71.0 in | Wt 187.4 lb

## 2016-07-09 DIAGNOSIS — I350 Nonrheumatic aortic (valve) stenosis: Secondary | ICD-10-CM

## 2016-07-09 DIAGNOSIS — I480 Paroxysmal atrial fibrillation: Secondary | ICD-10-CM | POA: Diagnosis not present

## 2016-07-09 DIAGNOSIS — I440 Atrioventricular block, first degree: Secondary | ICD-10-CM

## 2016-07-09 DIAGNOSIS — I10 Essential (primary) hypertension: Secondary | ICD-10-CM | POA: Diagnosis not present

## 2016-07-09 DIAGNOSIS — Z950 Presence of cardiac pacemaker: Secondary | ICD-10-CM

## 2016-07-09 NOTE — Progress Notes (Signed)
[facility] [address], [place]  [address] Phone: [phone] Fax:  [phone]  Date:  07/09/2016   ID:  Dalton Townsend, DOB [birth year], MRN [id number]  PCP:  Mathews Argyle, MD   History of Present Illness: Dalton Townsend is a 82 y.o. male with pacemaker insertion on 03/23/10, subsequent battery change out and RV lead failure here for followup. Has underlying AV block. He had a brief automatic mode switching less than 30 seconds. overall, doing well.  Three-vessel coronary artery disease status post bypass in 1999-no evidence of angina.   Prior history of postoperative atrial fibrillation-paroxysmal. No further occurrence. Not on anticoagulation and would like to contemplate this;does not want to start treatment . Recent pacemaker check normal.   Has ulcerative colitis, severe.  Followed by Dr. Cristina Gong.  Has aortic murmur, previously followed with echocardiography-no syncope, no angina, dyspnea. His hypertension has been an issue in the past. He reports 130s/90s at home.    He goes to gym almost daily - fitness training, aerobic exercises.  Had a leg skin graft on 05/04/16 for non healing wound.   Wt Readings from Last 3 Encounters:  07/09/16 187 lb 6.4 oz (85 kg)  05/04/16 182 lb 1 oz (82.6 kg)  04/30/16 182 lb 1 oz (82.6 kg)     Past Medical History:  Diagnosis Date  . Anemia    mild, hemoglobin 11.5  . Anxiety state, unspecified   . Aortic valve disorders   . Aortic valve stenosis   . Arthritis   . Atrioventricular block, complete ([place])   . Cardiac pacemaker in situ    3rd heart block  . CMV (cytomegalovirus infection) ([place]) 04/2010  . Colitis   . Coronary atherosclerosis of native coronary artery   . Dyslipidemia   . Edema   . Embolic stroke ([place])    right hemispheric embolic event  . Essential hypertension   . Functional diarrhea   . Heart murmur   . Hypercholesteremia   . Intestinal infection due to Clostridium difficile  03/2010  . Left sided ulcerative (chronic) colitis ([place])   . Mechanical complication due to cardiac pacemaker (electrode)   . Nonspecific abnormal finding in stool contents   . Occlusion and stenosis of carotid artery with cerebral infarction   . PAF (paroxysmal atrial fibrillation) ([facility])    Hx of post op PAF  . Positive for microalbuminuria    On Benicar  . Postsurgical aortocoronary bypass status    3 vessel CAD, s/p CABG 04/1998  . Presence of permanent cardiac pacemaker   . PVD (peripheral vascular disease) ([place])   . Seasonal allergies   . Sick sinus syndrome ([place])   . Sigmoid diverticulosis   . TIA (transient ischemic attack)   . Ulcerative (chronic) enterocolitis ([place])   . 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 Ulcerative colitis     Past Surgical History:  Procedure Laterality Date  . CARDIAC CATHETERIZATION  1999  . CAROTID ENDARTERECTOMY Right 2004  . CATARACT EXTRACTION, BILATERAL  2011  . CORONARY ARTERY BYPASS GRAFT  04/1998   3 vessel CAD  . INCISION AND DRAINAGE OF WOUND Left 05/04/2016   Procedure: SURGICAL PREP FOR GRAFTING LEFT LEG WITH THERA SKIN APPLICATION;  Surgeon: Irene Limbo, MD;  Location: [facility];  Service: Plastics;  Laterality: Left;  . INTRAOPERATIVE ARTERIOGRAM  06/2000  . PACEMAKER INSERTION  03/25/2010  . TOTAL KNEE ARTHROPLASTY  07/16/09  . TRANSURETHRAL RESECTION OF PROSTATE  1998    Current Outpatient Prescriptions  Medication Sig Dispense Refill  . amLODipine (NORVASC) 5 MG tablet Take 1 tablet (5 mg total) by mouth daily. 90 tablet 2  . aspirin EC 81 MG tablet Take 81 mg by mouth daily.    [name] atorvastatin (LIPITOR) 20 MG tablet Take 20 mg by mouth daily.    . balsalazide (COLAZAL) 750 MG capsule Take 2,250 mg by mouth 3 (three) times daily.    [name] escitalopram (LEXAPRO) 10 MG tablet Take 10 mg by mouth daily.      . finasteride (PROSCAR) 5 MG tablet Take 5 mg by mouth daily.     .  furosemide (LASIX) 40 MG tablet Take 40 mg by mouth 2 (two) times daily.    [name] HYDROcodone-acetaminophen (NORCO) 5-325 MG tablet Take 1 tablet by mouth every 6 (six) hours as needed for moderate pain. 20 tablet 0  . latanoprost (XALATAN) 0.005 % ophthalmic solution Place 1 drop into both eyes at bedtime.     [name] losartan (COZAAR) 100 MG tablet Take 100 mg by mouth daily.     . metoprolol succinate (TOPROL-XL) 50 MG 24 hr tablet Take 50 mg by mouth daily. Take with or immediately following a meal.    . Multiple Vitamin (MULTIVITAMIN) tablet Take 1 tablet by mouth daily.      . potassium chloride (KLOR-CON) 10 MEQ CR tablet Take 10 mEq by mouth 2 (two) times daily.       No current facility-administered medications for this visit.     Allergies:    Allergies  Allergen Reactions  . No Known Allergies     Social History:  The patient  reports that he quit smoking about 63 years ago. His smoking use included Cigarettes. He quit after 10.00 years of use. He has quit using smokeless tobacco. He reports that he does not drink alcohol or use drugs.   Family History  Problem Relation Age of Onset  . Congestive Heart Failure Mother   . Leukemia Father   . Leukemia    . Heart failure    . Cancer      ROS:  Please see the history of present illness.   No shortness of breath, no chest pain, syncope.  Occasional blood in stool.  + LE edema.  All other systems reviewed and negative.   PHYSICAL EXAM: VS:  BP (!) 170/98   Pulse 98   Ht 5' 11"  (1.803 m)   Wt 187 lb 6.4 oz (85 kg)   BMI 26.14 kg/m  Well nourished, well developed, in no acute distress  HEENT: normal, /AT Neck: no JVD, normal carotid upstroke, no bruit Cardiac:  normal S1, S2; RRR; 3/6 S murmur Lungs:  clear to auscultation bilaterally, no wheezing, rhonchi or rales  Abd: soft, nontender, no hepatomegaly  Ext: trace LE edema, 2+ distal pulses, left leg dressed Skin: warm and dry  GU: deferred Neuro: no focal abnormalities  noted, AAO x 3  EKG:  01/04/14 to Ventricular paced rhythm Labs: Creatinine 0.88 - Hg 14.4 11/14  ECHO: 05/22/14:  - Left ventricle: The cavity size was normal. There was mild focal basal hypertrophy of the septum. Systolic function was normal. The estimated ejection fraction was in the range of 60% to 65%. Wall motion was normal; there were no regional wall motion abnormalities. Doppler parameters are consistent with abnormal left ventricular relaxation (grade 1 diastolic dysfunction). - Aortic valve: There was  moderate stenosis. There was mild regurgitation. Peak velocity (S): 348 cm/s. Mean gradient (S): 32 mm Hg. Peak gradient (S): 48 mm Hg. - Mitral valve: Calcified annulus. - Right ventricle: The cavity size was mildly dilated. Wall thickness was normal.  ASSESSMENT AND PLAN:  1. Paroxysmal atrial fibrillation-I agree with Dr. Lovena Le, his risk of stroke given paroxysmal episodes of atrial fibrillation detected on pacemaker interrogation should be treated with anticoagulation. I suggested Eliquis. Creatinine and hemoglobin previously checked in November were normal. His daughter is a Software engineer here at [employer] and he will discuss this with her. He has ulcerative colitis and in the past has had bleeding from the anus. He is concerned about this. Obviously bleeding is a risk. We will closely monitor him if he were on anticoagulation. He admits that he would like to think about this. Does not wish to be on anticoagulation at this time. 2. Difficulty healing left leg superficial wound.-Seeing plastics, skin grafting, wound care. Offered him consultative visit with [place] specialist, he declined. 3. Pacemaker-functioning well. 4. Hypertension-degree of whitecoat hypertension. Normally reasonably controlled at home. He showed me a list. 5. Moderate aortic stenosis-previously detected on echocardiogram, murmur quite harsh. I will repeat echocardiogram. 6. 1 year  followup.  Signed, Candee Furbish, MD [facility]  07/09/2016 10:11 AM

## 2016-07-09 NOTE — Patient Instructions (Signed)
Medication Instructions:  The current medical regimen is effective;  continue present plan and medications.  Testing/Procedures: Your physician has requested that you have an echocardiogram. Echocardiography is a painless test that uses sound waves to create images of your heart. It provides your doctor with information about the size and shape of your heart and how well your heart's chambers and valves are working. This procedure takes approximately one hour. There are no restrictions for this procedure.  Follow-Up: Follow up in 1 year with Dr. Marlou Porch.  You will receive a letter in the mail 2 months before you are due.  Please call us when you receive this letter to schedule your follow up appointment.  If you need a refill on your cardiac medications before your next appointment, please call your pharmacy.  Thank you for choosing [facility]!!

## 2016-07-14 DIAGNOSIS — S81812A Laceration without foreign body, left lower leg, initial encounter: Secondary | ICD-10-CM | POA: Diagnosis not present

## 2016-07-14 DIAGNOSIS — I10 Essential (primary) hypertension: Secondary | ICD-10-CM | POA: Diagnosis not present

## 2016-07-14 DIAGNOSIS — I2510 Atherosclerotic heart disease of native coronary artery without angina pectoris: Secondary | ICD-10-CM | POA: Diagnosis not present

## 2016-07-14 DIAGNOSIS — I872 Venous insufficiency (chronic) (peripheral): Secondary | ICD-10-CM | POA: Diagnosis not present

## 2016-07-14 DIAGNOSIS — M70862 Other soft tissue disorders related to use, overuse and pressure, left lower leg: Secondary | ICD-10-CM | POA: Diagnosis not present

## 2016-07-14 DIAGNOSIS — L97221 Non-pressure chronic ulcer of left calf limited to breakdown of skin: Secondary | ICD-10-CM | POA: Diagnosis not present

## 2016-07-14 DIAGNOSIS — I739 Peripheral vascular disease, unspecified: Secondary | ICD-10-CM | POA: Diagnosis not present

## 2016-07-14 DIAGNOSIS — L97223 Non-pressure chronic ulcer of left calf with necrosis of muscle: Secondary | ICD-10-CM | POA: Diagnosis not present

## 2016-07-21 ENCOUNTER — Telehealth: Payer: Self-pay | Admitting: Cardiology

## 2016-07-21 ENCOUNTER — Ambulatory Visit (INDEPENDENT_AMBULATORY_CARE_PROVIDER_SITE_OTHER): Payer: Medicare Other | Admitting: *Deleted

## 2016-07-21 DIAGNOSIS — I2510 Atherosclerotic heart disease of native coronary artery without angina pectoris: Secondary | ICD-10-CM | POA: Diagnosis not present

## 2016-07-21 DIAGNOSIS — I480 Paroxysmal atrial fibrillation: Secondary | ICD-10-CM

## 2016-07-21 DIAGNOSIS — M70862 Other soft tissue disorders related to use, overuse and pressure, left lower leg: Secondary | ICD-10-CM | POA: Diagnosis not present

## 2016-07-21 DIAGNOSIS — I10 Essential (primary) hypertension: Secondary | ICD-10-CM | POA: Diagnosis not present

## 2016-07-21 DIAGNOSIS — I739 Peripheral vascular disease, unspecified: Secondary | ICD-10-CM | POA: Diagnosis not present

## 2016-07-21 DIAGNOSIS — S81812A Laceration without foreign body, left lower leg, initial encounter: Secondary | ICD-10-CM | POA: Diagnosis not present

## 2016-07-21 DIAGNOSIS — L97223 Non-pressure chronic ulcer of left calf with necrosis of muscle: Secondary | ICD-10-CM | POA: Diagnosis not present

## 2016-07-21 NOTE — Telephone Encounter (Signed)
LMOVM reminding pt to send remote transmission.   

## 2016-07-21 NOTE — Progress Notes (Signed)
Remote pacemaker transmission.   

## 2016-07-28 DIAGNOSIS — L97223 Non-pressure chronic ulcer of left calf with necrosis of muscle: Secondary | ICD-10-CM | POA: Diagnosis not present

## 2016-07-28 DIAGNOSIS — I872 Venous insufficiency (chronic) (peripheral): Secondary | ICD-10-CM | POA: Diagnosis not present

## 2016-07-28 DIAGNOSIS — I10 Essential (primary) hypertension: Secondary | ICD-10-CM | POA: Diagnosis not present

## 2016-07-28 DIAGNOSIS — S81812A Laceration without foreign body, left lower leg, initial encounter: Secondary | ICD-10-CM | POA: Diagnosis not present

## 2016-07-28 DIAGNOSIS — I739 Peripheral vascular disease, unspecified: Secondary | ICD-10-CM | POA: Diagnosis not present

## 2016-07-28 DIAGNOSIS — I2510 Atherosclerotic heart disease of native coronary artery without angina pectoris: Secondary | ICD-10-CM | POA: Diagnosis not present

## 2016-07-28 DIAGNOSIS — M70862 Other soft tissue disorders related to use, overuse and pressure, left lower leg: Secondary | ICD-10-CM | POA: Diagnosis not present

## 2016-07-28 DIAGNOSIS — L97822 Non-pressure chronic ulcer of other part of left lower leg with fat layer exposed: Secondary | ICD-10-CM | POA: Diagnosis not present

## 2016-07-29 ENCOUNTER — Encounter: Payer: Self-pay | Admitting: Cardiology

## 2016-08-04 ENCOUNTER — Encounter (HOSPITAL_BASED_OUTPATIENT_CLINIC_OR_DEPARTMENT_OTHER): Payer: Medicare Other | Attending: Surgery

## 2016-08-04 DIAGNOSIS — Z87891 Personal history of nicotine dependence: Secondary | ICD-10-CM | POA: Diagnosis not present

## 2016-08-04 DIAGNOSIS — Z96659 Presence of unspecified artificial knee joint: Secondary | ICD-10-CM | POA: Insufficient documentation

## 2016-08-04 DIAGNOSIS — S81812A Laceration without foreign body, left lower leg, initial encounter: Secondary | ICD-10-CM | POA: Diagnosis not present

## 2016-08-04 DIAGNOSIS — M70862 Other soft tissue disorders related to use, overuse and pressure, left lower leg: Secondary | ICD-10-CM | POA: Diagnosis not present

## 2016-08-04 DIAGNOSIS — I739 Peripheral vascular disease, unspecified: Secondary | ICD-10-CM | POA: Diagnosis not present

## 2016-08-04 DIAGNOSIS — Z950 Presence of cardiac pacemaker: Secondary | ICD-10-CM | POA: Insufficient documentation

## 2016-08-04 DIAGNOSIS — I2510 Atherosclerotic heart disease of native coronary artery without angina pectoris: Secondary | ICD-10-CM | POA: Insufficient documentation

## 2016-08-04 DIAGNOSIS — L97821 Non-pressure chronic ulcer of other part of left lower leg limited to breakdown of skin: Secondary | ICD-10-CM | POA: Insufficient documentation

## 2016-08-04 DIAGNOSIS — Z951 Presence of aortocoronary bypass graft: Secondary | ICD-10-CM | POA: Diagnosis not present

## 2016-08-04 DIAGNOSIS — Z8673 Personal history of transient ischemic attack (TIA), and cerebral infarction without residual deficits: Secondary | ICD-10-CM | POA: Insufficient documentation

## 2016-08-04 DIAGNOSIS — L97223 Non-pressure chronic ulcer of left calf with necrosis of muscle: Secondary | ICD-10-CM | POA: Diagnosis not present

## 2016-08-04 DIAGNOSIS — S81802A Unspecified open wound, left lower leg, initial encounter: Secondary | ICD-10-CM | POA: Diagnosis not present

## 2016-08-04 DIAGNOSIS — I10 Essential (primary) hypertension: Secondary | ICD-10-CM | POA: Insufficient documentation

## 2016-08-07 LAB — CUP PACEART REMOTE DEVICE CHECK
Battery Impedance: 421 Ohm
Battery Voltage: 2.79 V
Brady Statistic AP VP Percent: 46 %
Brady Statistic AP VS Percent: 0 %
Brady Statistic AS VS Percent: 1 %
Implantable Lead Implant Date: 20110725
Implantable Lead Model: 5076
Implantable Lead Model: 5076
Lead Channel Impedance Value: 549 Ohm
Lead Channel Impedance Value: 645 Ohm
Lead Channel Pacing Threshold Amplitude: 0.375 V
Lead Channel Pacing Threshold Pulse Width: 0.4 ms
Lead Channel Sensing Intrinsic Amplitude: 0.7 mV
Lead Channel Setting Pacing Amplitude: 2.5 V
MDC IDC LEAD IMPLANT DT: 20110725
MDC IDC LEAD LOCATION: 753859
MDC IDC LEAD LOCATION: 753860
MDC IDC MSMT BATTERY REMAINING LONGEVITY: 90 mo
MDC IDC MSMT LEADCHNL RV PACING THRESHOLD AMPLITUDE: 0.625 V
MDC IDC MSMT LEADCHNL RV PACING THRESHOLD PULSEWIDTH: 0.4 ms
MDC IDC PG IMPLANT DT: 20110725
MDC IDC SESS DTM: 20171122194421
MDC IDC SET LEADCHNL RA PACING AMPLITUDE: 2 V
MDC IDC SET LEADCHNL RV PACING PULSEWIDTH: 0.4 ms
MDC IDC SET LEADCHNL RV SENSING SENSITIVITY: 2 mV
MDC IDC STAT BRADY AS VP PERCENT: 53 %

## 2016-08-10 ENCOUNTER — Ambulatory Visit (HOSPITAL_COMMUNITY): Payer: Medicare Other | Attending: Cardiology

## 2016-08-10 ENCOUNTER — Other Ambulatory Visit: Payer: Self-pay

## 2016-08-10 DIAGNOSIS — I700 Atherosclerosis of aorta: Secondary | ICD-10-CM | POA: Insufficient documentation

## 2016-08-10 DIAGNOSIS — I5189 Other ill-defined heart diseases: Secondary | ICD-10-CM | POA: Diagnosis not present

## 2016-08-10 DIAGNOSIS — I350 Nonrheumatic aortic (valve) stenosis: Secondary | ICD-10-CM | POA: Insufficient documentation

## 2016-08-11 DIAGNOSIS — L97821 Non-pressure chronic ulcer of other part of left lower leg limited to breakdown of skin: Secondary | ICD-10-CM | POA: Diagnosis not present

## 2016-08-11 DIAGNOSIS — L97223 Non-pressure chronic ulcer of left calf with necrosis of muscle: Secondary | ICD-10-CM | POA: Diagnosis not present

## 2016-08-11 DIAGNOSIS — S81812A Laceration without foreign body, left lower leg, initial encounter: Secondary | ICD-10-CM | POA: Diagnosis not present

## 2016-08-11 DIAGNOSIS — I2510 Atherosclerotic heart disease of native coronary artery without angina pectoris: Secondary | ICD-10-CM | POA: Diagnosis not present

## 2016-08-11 DIAGNOSIS — I739 Peripheral vascular disease, unspecified: Secondary | ICD-10-CM | POA: Diagnosis not present

## 2016-08-11 DIAGNOSIS — M70862 Other soft tissue disorders related to use, overuse and pressure, left lower leg: Secondary | ICD-10-CM | POA: Diagnosis not present

## 2016-08-11 DIAGNOSIS — S81802A Unspecified open wound, left lower leg, initial encounter: Secondary | ICD-10-CM | POA: Diagnosis not present

## 2016-08-11 DIAGNOSIS — I10 Essential (primary) hypertension: Secondary | ICD-10-CM | POA: Diagnosis not present

## 2016-08-11 DIAGNOSIS — Z8673 Personal history of transient ischemic attack (TIA), and cerebral infarction without residual deficits: Secondary | ICD-10-CM | POA: Diagnosis not present

## 2016-08-11 DIAGNOSIS — Z950 Presence of cardiac pacemaker: Secondary | ICD-10-CM | POA: Diagnosis not present

## 2016-08-12 ENCOUNTER — Encounter: Payer: Self-pay | Admitting: *Deleted

## 2016-08-13 ENCOUNTER — Encounter: Payer: Self-pay | Admitting: Cardiology

## 2016-08-18 DIAGNOSIS — L97223 Non-pressure chronic ulcer of left calf with necrosis of muscle: Secondary | ICD-10-CM | POA: Diagnosis not present

## 2016-08-18 DIAGNOSIS — S81802A Unspecified open wound, left lower leg, initial encounter: Secondary | ICD-10-CM | POA: Diagnosis not present

## 2016-08-18 DIAGNOSIS — S81812A Laceration without foreign body, left lower leg, initial encounter: Secondary | ICD-10-CM | POA: Diagnosis not present

## 2016-08-18 DIAGNOSIS — I10 Essential (primary) hypertension: Secondary | ICD-10-CM | POA: Diagnosis not present

## 2016-08-18 DIAGNOSIS — L97821 Non-pressure chronic ulcer of other part of left lower leg limited to breakdown of skin: Secondary | ICD-10-CM | POA: Diagnosis not present

## 2016-08-18 DIAGNOSIS — Z950 Presence of cardiac pacemaker: Secondary | ICD-10-CM | POA: Diagnosis not present

## 2016-08-18 DIAGNOSIS — I2510 Atherosclerotic heart disease of native coronary artery without angina pectoris: Secondary | ICD-10-CM | POA: Diagnosis not present

## 2016-08-18 DIAGNOSIS — Z8673 Personal history of transient ischemic attack (TIA), and cerebral infarction without residual deficits: Secondary | ICD-10-CM | POA: Diagnosis not present

## 2016-08-18 DIAGNOSIS — I739 Peripheral vascular disease, unspecified: Secondary | ICD-10-CM | POA: Diagnosis not present

## 2016-08-18 DIAGNOSIS — M70862 Other soft tissue disorders related to use, overuse and pressure, left lower leg: Secondary | ICD-10-CM | POA: Diagnosis not present

## 2016-09-01 ENCOUNTER — Encounter (HOSPITAL_BASED_OUTPATIENT_CLINIC_OR_DEPARTMENT_OTHER): Payer: Medicare Other | Attending: Surgery

## 2016-09-01 DIAGNOSIS — I2510 Atherosclerotic heart disease of native coronary artery without angina pectoris: Secondary | ICD-10-CM | POA: Diagnosis not present

## 2016-09-01 DIAGNOSIS — Z950 Presence of cardiac pacemaker: Secondary | ICD-10-CM | POA: Insufficient documentation

## 2016-09-01 DIAGNOSIS — L97223 Non-pressure chronic ulcer of left calf with necrosis of muscle: Secondary | ICD-10-CM | POA: Diagnosis not present

## 2016-09-01 DIAGNOSIS — Z951 Presence of aortocoronary bypass graft: Secondary | ICD-10-CM | POA: Diagnosis not present

## 2016-09-01 DIAGNOSIS — S81812A Laceration without foreign body, left lower leg, initial encounter: Secondary | ICD-10-CM | POA: Diagnosis not present

## 2016-09-01 DIAGNOSIS — I10 Essential (primary) hypertension: Secondary | ICD-10-CM | POA: Insufficient documentation

## 2016-09-01 DIAGNOSIS — Z8673 Personal history of transient ischemic attack (TIA), and cerebral infarction without residual deficits: Secondary | ICD-10-CM | POA: Diagnosis not present

## 2016-09-01 DIAGNOSIS — I739 Peripheral vascular disease, unspecified: Secondary | ICD-10-CM | POA: Diagnosis not present

## 2016-09-01 DIAGNOSIS — L97821 Non-pressure chronic ulcer of other part of left lower leg limited to breakdown of skin: Secondary | ICD-10-CM | POA: Diagnosis not present

## 2016-09-01 DIAGNOSIS — Z87891 Personal history of nicotine dependence: Secondary | ICD-10-CM | POA: Insufficient documentation

## 2016-09-01 DIAGNOSIS — M70862 Other soft tissue disorders related to use, overuse and pressure, left lower leg: Secondary | ICD-10-CM | POA: Diagnosis not present

## 2016-09-01 DIAGNOSIS — S81802A Unspecified open wound, left lower leg, initial encounter: Secondary | ICD-10-CM | POA: Diagnosis not present

## 2016-09-08 DIAGNOSIS — L97821 Non-pressure chronic ulcer of other part of left lower leg limited to breakdown of skin: Secondary | ICD-10-CM | POA: Diagnosis not present

## 2016-09-08 DIAGNOSIS — I2510 Atherosclerotic heart disease of native coronary artery without angina pectoris: Secondary | ICD-10-CM | POA: Diagnosis not present

## 2016-09-08 DIAGNOSIS — I10 Essential (primary) hypertension: Secondary | ICD-10-CM | POA: Diagnosis not present

## 2016-09-08 DIAGNOSIS — Z8673 Personal history of transient ischemic attack (TIA), and cerebral infarction without residual deficits: Secondary | ICD-10-CM | POA: Diagnosis not present

## 2016-09-08 DIAGNOSIS — S81812A Laceration without foreign body, left lower leg, initial encounter: Secondary | ICD-10-CM | POA: Diagnosis not present

## 2016-09-08 DIAGNOSIS — L97223 Non-pressure chronic ulcer of left calf with necrosis of muscle: Secondary | ICD-10-CM | POA: Diagnosis not present

## 2016-09-08 DIAGNOSIS — I739 Peripheral vascular disease, unspecified: Secondary | ICD-10-CM | POA: Diagnosis not present

## 2016-09-08 DIAGNOSIS — S81802A Unspecified open wound, left lower leg, initial encounter: Secondary | ICD-10-CM | POA: Diagnosis not present

## 2016-09-08 DIAGNOSIS — Z950 Presence of cardiac pacemaker: Secondary | ICD-10-CM | POA: Diagnosis not present

## 2016-09-08 DIAGNOSIS — M70862 Other soft tissue disorders related to use, overuse and pressure, left lower leg: Secondary | ICD-10-CM | POA: Diagnosis not present

## 2016-09-14 DIAGNOSIS — L97821 Non-pressure chronic ulcer of other part of left lower leg limited to breakdown of skin: Secondary | ICD-10-CM | POA: Diagnosis not present

## 2016-09-14 DIAGNOSIS — Z8673 Personal history of transient ischemic attack (TIA), and cerebral infarction without residual deficits: Secondary | ICD-10-CM | POA: Diagnosis not present

## 2016-09-14 DIAGNOSIS — L97223 Non-pressure chronic ulcer of left calf with necrosis of muscle: Secondary | ICD-10-CM | POA: Diagnosis not present

## 2016-09-14 DIAGNOSIS — S81802A Unspecified open wound, left lower leg, initial encounter: Secondary | ICD-10-CM | POA: Diagnosis not present

## 2016-09-14 DIAGNOSIS — I2510 Atherosclerotic heart disease of native coronary artery without angina pectoris: Secondary | ICD-10-CM | POA: Diagnosis not present

## 2016-09-14 DIAGNOSIS — I10 Essential (primary) hypertension: Secondary | ICD-10-CM | POA: Diagnosis not present

## 2016-09-14 DIAGNOSIS — Z950 Presence of cardiac pacemaker: Secondary | ICD-10-CM | POA: Diagnosis not present

## 2016-09-14 DIAGNOSIS — M70862 Other soft tissue disorders related to use, overuse and pressure, left lower leg: Secondary | ICD-10-CM | POA: Diagnosis not present

## 2016-09-14 DIAGNOSIS — S81812A Laceration without foreign body, left lower leg, initial encounter: Secondary | ICD-10-CM | POA: Diagnosis not present

## 2016-09-14 DIAGNOSIS — I739 Peripheral vascular disease, unspecified: Secondary | ICD-10-CM | POA: Diagnosis not present

## 2016-09-15 ENCOUNTER — Ambulatory Visit: Payer: Medicare Other | Admitting: Cardiology

## 2016-09-22 DIAGNOSIS — S81812A Laceration without foreign body, left lower leg, initial encounter: Secondary | ICD-10-CM | POA: Diagnosis not present

## 2016-09-22 DIAGNOSIS — L97223 Non-pressure chronic ulcer of left calf with necrosis of muscle: Secondary | ICD-10-CM | POA: Diagnosis not present

## 2016-09-22 DIAGNOSIS — I10 Essential (primary) hypertension: Secondary | ICD-10-CM | POA: Diagnosis not present

## 2016-09-22 DIAGNOSIS — I739 Peripheral vascular disease, unspecified: Secondary | ICD-10-CM | POA: Diagnosis not present

## 2016-09-22 DIAGNOSIS — L97821 Non-pressure chronic ulcer of other part of left lower leg limited to breakdown of skin: Secondary | ICD-10-CM | POA: Diagnosis not present

## 2016-09-22 DIAGNOSIS — I2510 Atherosclerotic heart disease of native coronary artery without angina pectoris: Secondary | ICD-10-CM | POA: Diagnosis not present

## 2016-09-22 DIAGNOSIS — Z950 Presence of cardiac pacemaker: Secondary | ICD-10-CM | POA: Diagnosis not present

## 2016-09-22 DIAGNOSIS — Z8673 Personal history of transient ischemic attack (TIA), and cerebral infarction without residual deficits: Secondary | ICD-10-CM | POA: Diagnosis not present

## 2016-09-22 DIAGNOSIS — M70862 Other soft tissue disorders related to use, overuse and pressure, left lower leg: Secondary | ICD-10-CM | POA: Diagnosis not present

## 2016-09-22 DIAGNOSIS — S81802A Unspecified open wound, left lower leg, initial encounter: Secondary | ICD-10-CM | POA: Diagnosis not present

## 2016-09-29 DIAGNOSIS — I10 Essential (primary) hypertension: Secondary | ICD-10-CM | POA: Diagnosis not present

## 2016-09-29 DIAGNOSIS — Z8673 Personal history of transient ischemic attack (TIA), and cerebral infarction without residual deficits: Secondary | ICD-10-CM | POA: Diagnosis not present

## 2016-09-29 DIAGNOSIS — L97221 Non-pressure chronic ulcer of left calf limited to breakdown of skin: Secondary | ICD-10-CM | POA: Diagnosis not present

## 2016-09-29 DIAGNOSIS — L97821 Non-pressure chronic ulcer of other part of left lower leg limited to breakdown of skin: Secondary | ICD-10-CM | POA: Diagnosis not present

## 2016-09-29 DIAGNOSIS — I739 Peripheral vascular disease, unspecified: Secondary | ICD-10-CM | POA: Diagnosis not present

## 2016-09-29 DIAGNOSIS — I2510 Atherosclerotic heart disease of native coronary artery without angina pectoris: Secondary | ICD-10-CM | POA: Diagnosis not present

## 2016-09-29 DIAGNOSIS — Z950 Presence of cardiac pacemaker: Secondary | ICD-10-CM | POA: Diagnosis not present

## 2016-10-05 ENCOUNTER — Encounter (HOSPITAL_BASED_OUTPATIENT_CLINIC_OR_DEPARTMENT_OTHER): Payer: Medicare Other | Attending: Surgery

## 2016-10-05 DIAGNOSIS — I2510 Atherosclerotic heart disease of native coronary artery without angina pectoris: Secondary | ICD-10-CM | POA: Diagnosis not present

## 2016-10-05 DIAGNOSIS — I872 Venous insufficiency (chronic) (peripheral): Secondary | ICD-10-CM | POA: Diagnosis not present

## 2016-10-05 DIAGNOSIS — L97822 Non-pressure chronic ulcer of other part of left lower leg with fat layer exposed: Secondary | ICD-10-CM | POA: Insufficient documentation

## 2016-10-05 DIAGNOSIS — I739 Peripheral vascular disease, unspecified: Secondary | ICD-10-CM | POA: Diagnosis not present

## 2016-10-05 DIAGNOSIS — I10 Essential (primary) hypertension: Secondary | ICD-10-CM | POA: Insufficient documentation

## 2016-10-06 ENCOUNTER — Encounter: Payer: Self-pay | Admitting: Cardiology

## 2016-10-06 ENCOUNTER — Ambulatory Visit (INDEPENDENT_AMBULATORY_CARE_PROVIDER_SITE_OTHER): Payer: Medicare Other | Admitting: Cardiology

## 2016-10-06 VITALS — BP 128/84 | HR 68 | Ht 71.0 in | Wt 184.2 lb

## 2016-10-06 DIAGNOSIS — Z950 Presence of cardiac pacemaker: Secondary | ICD-10-CM

## 2016-10-06 DIAGNOSIS — I350 Nonrheumatic aortic (valve) stenosis: Secondary | ICD-10-CM

## 2016-10-06 DIAGNOSIS — I480 Paroxysmal atrial fibrillation: Secondary | ICD-10-CM

## 2016-10-06 DIAGNOSIS — I10 Essential (primary) hypertension: Secondary | ICD-10-CM

## 2016-10-06 MED ORDER — APIXABAN 5 MG PO TABS: 5.0000 mg | ORAL_TABLET | Freq: Two times a day (BID) | ORAL | 6 refills | Status: DC

## 2016-10-06 NOTE — Progress Notes (Signed)
[facility] [address], [place]  [address] Phone: [phone] Fax:  [phone]  Date:  10/06/2016   ID:  SHABAZZ MCKEY, DOB 02-Apr-1923, MRN [id number]  PCP:  Mathews Argyle, MD   History of Present Illness: Dalton Townsend is a 83 y.o. male with coronary artery disease status post CABG, severe aortic stenosis, pacemaker insertion on 03/23/10, subsequent battery change out and RV lead failure here for followup. Has underlying AV block. He had a brief automatic mode switching less than 30 seconds. overall, doing well.  On 10/06/16 we had lengthy discussion about TAVR  Three-vessel coronary artery disease status post bypass in 1999-no evidence of angina.   Prior history of postoperative atrial fibrillation-paroxysmal. No further occurrence. Not on anticoagulation and would like to contemplate this;does not want to start treatment . Recent pacemaker check normal.   Has ulcerative colitis, severe.  Followed by Dr. Cristina Gong.  Has aortic murmur, previously followed with echocardiography-no syncope, no angina, dyspnea. His hypertension has been an issue in the past. He reports 130s/90s at home.    He goes to gym almost daily - fitness training, aerobic exercises.  Had a leg skin graft on 05/04/16 for non healing wound.   Wt Readings from Last 3 Encounters:  10/06/16 184 lb 3.2 oz (83.6 kg)  07/09/16 187 lb 6.4 oz (85 kg)  05/04/16 182 lb 1 oz (82.6 kg)     Past Medical History:  Diagnosis Date  . Anemia    mild, hemoglobin 11.5  . Anxiety state, unspecified   . Aortic valve disorders   . Aortic valve stenosis   . Arthritis   . Atrioventricular block, complete ([place])   . Cardiac pacemaker in situ    3rd heart block  . CMV (cytomegalovirus infection) ([place]) 04/2010  . Colitis   . Coronary atherosclerosis of native coronary artery   . Dyslipidemia   . Edema   . Embolic stroke ([place])    right hemispheric embolic event  . Essential hypertension   .  Functional diarrhea   . Heart murmur   . Hypercholesteremia   . Intestinal infection due to Clostridium difficile 03/2010  . Left sided ulcerative (chronic) colitis ([place])   . Mechanical complication due to cardiac pacemaker (electrode)   . Nonspecific abnormal finding in stool contents   . Occlusion and stenosis of carotid artery with cerebral infarction   . PAF (paroxysmal atrial fibrillation) ([facility])    Hx of post op PAF  . Positive for microalbuminuria    On Benicar  . Postsurgical aortocoronary bypass status    3 vessel CAD, s/p CABG 04/1998  . Presence of permanent cardiac pacemaker   . PVD (peripheral vascular disease) ([place])   . Seasonal allergies   . Sick sinus syndrome ([place])   . Sigmoid diverticulosis   . TIA (transient ischemic attack)   . Ulcerative (chronic) enterocolitis ([place])   . 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 Ulcerative colitis     Past Surgical History:  Procedure Laterality Date  . CARDIAC CATHETERIZATION  1999  . CAROTID ENDARTERECTOMY Right 2004  . CATARACT EXTRACTION, BILATERAL  2011  . CORONARY ARTERY BYPASS GRAFT  04/1998   3 vessel CAD  . INCISION AND DRAINAGE OF WOUND Left 05/04/2016   Procedure: SURGICAL PREP FOR GRAFTING LEFT LEG WITH THERA SKIN APPLICATION;  Surgeon: Irene Limbo, MD;  Location: [facility];  Service: Plastics;  Laterality: Left;  .  INTRAOPERATIVE ARTERIOGRAM  06/2000  . PACEMAKER INSERTION  03/25/2010  . TOTAL KNEE ARTHROPLASTY  07/16/09  . TRANSURETHRAL RESECTION OF PROSTATE  1998    Current Outpatient Prescriptions  Medication Sig Dispense Refill  . amLODipine (NORVASC) 5 MG tablet Take 1 tablet (5 mg total) by mouth daily. 90 tablet 2  . atorvastatin (LIPITOR) 20 MG tablet Take 20 mg by mouth daily.    . balsalazide (COLAZAL) 750 MG capsule Take 2,250 mg by mouth 3 (three) times daily.    [name] escitalopram (LEXAPRO) 10 MG tablet Take 10 mg by mouth daily.      . finasteride  (PROSCAR) 5 MG tablet Take 5 mg by mouth daily.     . furosemide (LASIX) 40 MG tablet Take 40 mg by mouth 2 (two) times daily.    [name] latanoprost (XALATAN) 0.005 % ophthalmic solution Place 1 drop into both eyes at bedtime.     [name] losartan (COZAAR) 100 MG tablet Take 100 mg by mouth daily.     . metoprolol succinate (TOPROL-XL) 50 MG 24 hr tablet Take 50 mg by mouth daily. Take with or immediately following a meal.    . Multiple Vitamin (MULTIVITAMIN) tablet Take 1 tablet by mouth daily.      . potassium chloride (KLOR-CON) 10 MEQ CR tablet Take 10 mEq by mouth 2 (two) times daily.      [name] apixaban (ELIQUIS) 5 MG TABS tablet Take 1 tablet (5 mg total) by mouth 2 (two) times daily. 60 tablet 6   No current facility-administered medications for this visit.     Allergies:    Allergies  Allergen Reactions  . No Known Allergies     Social History:  The patient  reports that he quit smoking about 64 years ago. His smoking use included Cigarettes. He quit after 10.00 years of use. He has quit using smokeless tobacco. He reports that he does not drink alcohol or use drugs.   Family History  Problem Relation Age of Onset  . Congestive Heart Failure Mother   . Leukemia Father   . Leukemia    . Heart failure    . Cancer      ROS:  Please see the history of present illness.   No shortness of breath, no chest pain, syncope.  Occasional blood in stool.  + LE edema.  All other systems reviewed and negative.   PHYSICAL EXAM: VS:  BP 128/84   Pulse 68   Ht 5' 11"  (1.803 m)   Wt 184 lb 3.2 oz (83.6 kg)   BMI 25.69 kg/m  Well nourished, well developed, in no acute distress  HEENT: normal, [place]/AT Neck: no JVD, normal carotid upstroke, no bruit Cardiac:  normal S1, S2; RRR; 3/6 S murmur Lungs:  clear to auscultation bilaterally, no wheezing, rhonchi or rales  Abd: soft, nontender, no hepatomegaly  Ext: trace LE edema, 2+ distal pulses, left leg dressed Skin: warm and dry  GU: deferred Neuro: no  focal abnormalities noted, AAO x 3  EKG:  01/04/14 to Ventricular paced rhythm Labs: Creatinine 0.88 - Hg 14.4 11/14  ECHO: 05/22/14:  - Left ventricle: The cavity size was normal. There was mild focal basal hypertrophy of the septum. Systolic function was normal. The estimated ejection fraction was in the range of 60% to 65%. Wall motion was normal; there were no regional wall motion abnormalities. Doppler parameters are consistent with abnormal left ventricular relaxation (grade 1 diastolic dysfunction). - Aortic valve: There was moderate  stenosis. There was mild regurgitation. Peak velocity (S): 348 cm/s. Mean gradient (S): 32 mm Hg. Peak gradient (S): 48 mm Hg. - Mitral valve: Calcified annulus. - Right ventricle: The cavity size was mildly dilated. Wall thickness was normal.  Echocardiogram 08/10/16  - Left ventricle: The cavity size was normal. Wall thickness was   increased in a pattern of mild LVH. Systolic function was normal.   The estimated ejection fraction was in the range of 55% to 60%.   Wall motion was normal; there were no regional wall motion   abnormalities. Doppler parameters are consistent with abnormal   left ventricular relaxation (grade 1 diastolic dysfunction).   Doppler parameters are consistent with high ventricular filling   pressure. - Ventricular septum: Septal motion showed dyssynergy. These   changes are consistent with right ventricular pacing. - Aortic valve: Valve mobility was restricted. There was severe   stenosis. There was mild regurgitation. Mean gradient (S): 47 mm   Hg. Peak gradient (S): 81 mm Hg. - Mitral valve: Calcified annulus. - Left atrium: The atrium was mildly dilated. - Right ventricle: The cavity size was mildly dilated. - Right atrium: The atrium was mildly dilated.  Impressions:  - Normal LV systolic function; grade 1 diastolic dysfunction;   calcified aortic valve with fusion of noncoronary and left  cusps;   severe AS with mean gradient 47 mmHg; mild AI; mild LAE/RAE/RVE.  ASSESSMENT AND PLAN:  1. Paroxysmal atrial fibrillation-I agree with Dr. Lovena Le, his risk of stroke given paroxysmal episodes of atrial fibrillation detected on pacemaker interrogation should be treated with anticoagulation. He is now willing to take Eliquis. He will go ahead and take his 6 week supply that he has at home and then he will call us up to ask for a 90 day supply to [facility]. Creatinine and hemoglobin previously checked in November were normal. His daughter is a Software engineer here at [facility] and he will discuss this with her. He has ulcerative colitis and in the past has had bleeding from the anus. He is concerned about this. Obviously bleeding is a risk. We will closely monitor him if he were on anticoagulation.  2. CAD - post CABG 3. Difficulty healing left leg superficial wound.-Seeing plastics, skin grafting, wound care. Offered him consultative visit with [facility] specialist, he declined. This seems to be improving. This would need to be healed by the time we would consider TAVR 4. Pacemaker-functioning well. 5. Hypertension-degree of whitecoat hypertension. Normally reasonably controlled at home. He showed me a list. 6. Severe aortic stenosis-detected on echocardiogram, murmur quite harsh. we had lengthy discussion today about potential TAVR . He admits that he is not having any symptoms currently. He is still going to the [facility], exercising. No shortness of breath, no syncope, no chest pain. We will closely follow him. His daughter Peg, pharmacist at [facility] was present for discussion.  7. 6 month followup.  Signed, Candee Furbish, MD [facility]  10/06/2016 4:03 PM

## 2016-10-06 NOTE — Patient Instructions (Addendum)
Medication Instructions:  Your physician has recommended you make the following change in your medication:  Start Eliquis 5 mg by mouth twice daily. Use supply you currently have at home.  Call our office when you want the 90 day supply sent to mail order Stop Aspirin   Labwork: none  Testing/Procedures: none  Follow-Up: Your physician wants you to follow-up in: 6 months.  You will receive a reminder letter in the mail two months in advance. If you don't receive a letter, please call our office to schedule the follow-up appointment.  Any Other Special Instructions Will Be Listed Below (If Applicable).     If you need a refill on your cardiac medications before your next appointment, please call your pharmacy.

## 2016-10-06 NOTE — Progress Notes (Signed)
[phone] 

## 2016-10-11 ENCOUNTER — Ambulatory Visit: Payer: Medicare Other | Admitting: Cardiology

## 2016-10-13 DIAGNOSIS — I872 Venous insufficiency (chronic) (peripheral): Secondary | ICD-10-CM | POA: Diagnosis not present

## 2016-10-13 DIAGNOSIS — I739 Peripheral vascular disease, unspecified: Secondary | ICD-10-CM | POA: Diagnosis not present

## 2016-10-13 DIAGNOSIS — L97822 Non-pressure chronic ulcer of other part of left lower leg with fat layer exposed: Secondary | ICD-10-CM | POA: Diagnosis not present

## 2016-10-13 DIAGNOSIS — I2510 Atherosclerotic heart disease of native coronary artery without angina pectoris: Secondary | ICD-10-CM | POA: Diagnosis not present

## 2016-10-13 DIAGNOSIS — I10 Essential (primary) hypertension: Secondary | ICD-10-CM | POA: Diagnosis not present

## 2016-10-20 ENCOUNTER — Ambulatory Visit (INDEPENDENT_AMBULATORY_CARE_PROVIDER_SITE_OTHER): Payer: Medicare Other | Admitting: *Deleted

## 2016-10-20 ENCOUNTER — Telehealth: Payer: Self-pay | Admitting: Cardiology

## 2016-10-20 DIAGNOSIS — I739 Peripheral vascular disease, unspecified: Secondary | ICD-10-CM | POA: Diagnosis not present

## 2016-10-20 DIAGNOSIS — I480 Paroxysmal atrial fibrillation: Secondary | ICD-10-CM | POA: Diagnosis not present

## 2016-10-20 DIAGNOSIS — L97822 Non-pressure chronic ulcer of other part of left lower leg with fat layer exposed: Secondary | ICD-10-CM | POA: Diagnosis not present

## 2016-10-20 DIAGNOSIS — I2510 Atherosclerotic heart disease of native coronary artery without angina pectoris: Secondary | ICD-10-CM | POA: Diagnosis not present

## 2016-10-20 DIAGNOSIS — I10 Essential (primary) hypertension: Secondary | ICD-10-CM | POA: Diagnosis not present

## 2016-10-20 DIAGNOSIS — S81802A Unspecified open wound, left lower leg, initial encounter: Secondary | ICD-10-CM | POA: Diagnosis not present

## 2016-10-20 NOTE — Telephone Encounter (Signed)
LMOVM reminding pt to send remote transmission.   

## 2016-10-20 NOTE — Progress Notes (Signed)
Remote pacemaker transmission.   

## 2016-10-21 ENCOUNTER — Encounter: Payer: Self-pay | Admitting: Cardiology

## 2016-10-21 LAB — CUP PACEART REMOTE DEVICE CHECK
Battery Impedance: 469 Ohm
Brady Statistic AP VP Percent: 45 %
Brady Statistic AP VS Percent: 0 %
Brady Statistic AS VS Percent: 1 %
Implantable Lead Implant Date: 20110725
Implantable Lead Location: 753859
Implantable Lead Model: 5076
Implantable Lead Model: 5076
Lead Channel Impedance Value: 501 Ohm
Lead Channel Impedance Value: 599 Ohm
Lead Channel Pacing Threshold Amplitude: 0.375 V
Lead Channel Pacing Threshold Pulse Width: 0.4 ms
Lead Channel Setting Pacing Amplitude: 2.5 V
MDC IDC LEAD IMPLANT DT: 20110725
MDC IDC LEAD LOCATION: 753860
MDC IDC MSMT BATTERY REMAINING LONGEVITY: 84 mo
MDC IDC MSMT BATTERY VOLTAGE: 2.78 V
MDC IDC MSMT LEADCHNL RV PACING THRESHOLD AMPLITUDE: 0.75 V
MDC IDC MSMT LEADCHNL RV PACING THRESHOLD PULSEWIDTH: 0.4 ms
MDC IDC PG IMPLANT DT: 20110725
MDC IDC SESS DTM: 20180221200037
MDC IDC SET LEADCHNL RA PACING AMPLITUDE: 2 V
MDC IDC SET LEADCHNL RV PACING PULSEWIDTH: 0.4 ms
MDC IDC SET LEADCHNL RV SENSING SENSITIVITY: 2 mV
MDC IDC STAT BRADY AS VP PERCENT: 54 %

## 2016-10-27 DIAGNOSIS — I739 Peripheral vascular disease, unspecified: Secondary | ICD-10-CM | POA: Diagnosis not present

## 2016-10-27 DIAGNOSIS — I10 Essential (primary) hypertension: Secondary | ICD-10-CM | POA: Diagnosis not present

## 2016-10-27 DIAGNOSIS — I2510 Atherosclerotic heart disease of native coronary artery without angina pectoris: Secondary | ICD-10-CM | POA: Diagnosis not present

## 2016-10-27 DIAGNOSIS — S81802A Unspecified open wound, left lower leg, initial encounter: Secondary | ICD-10-CM | POA: Diagnosis not present

## 2016-10-27 DIAGNOSIS — L97822 Non-pressure chronic ulcer of other part of left lower leg with fat layer exposed: Secondary | ICD-10-CM | POA: Diagnosis not present

## 2016-11-04 ENCOUNTER — Encounter: Payer: Self-pay | Admitting: Cardiology

## 2016-11-10 ENCOUNTER — Encounter (HOSPITAL_BASED_OUTPATIENT_CLINIC_OR_DEPARTMENT_OTHER): Payer: Medicare Other | Attending: Surgery

## 2016-11-10 DIAGNOSIS — I10 Essential (primary) hypertension: Secondary | ICD-10-CM | POA: Insufficient documentation

## 2016-11-10 DIAGNOSIS — I2510 Atherosclerotic heart disease of native coronary artery without angina pectoris: Secondary | ICD-10-CM | POA: Diagnosis not present

## 2016-11-10 DIAGNOSIS — L97822 Non-pressure chronic ulcer of other part of left lower leg with fat layer exposed: Secondary | ICD-10-CM | POA: Diagnosis not present

## 2016-11-10 DIAGNOSIS — S81802A Unspecified open wound, left lower leg, initial encounter: Secondary | ICD-10-CM | POA: Diagnosis not present

## 2016-11-15 ENCOUNTER — Telehealth: Payer: Self-pay | Admitting: *Deleted

## 2016-11-15 DIAGNOSIS — N401 Enlarged prostate with lower urinary tract symptoms: Secondary | ICD-10-CM | POA: Diagnosis not present

## 2016-11-15 DIAGNOSIS — R351 Nocturia: Secondary | ICD-10-CM | POA: Diagnosis not present

## 2016-11-15 MED ORDER — APIXABAN 5 MG PO TABS: 5.0000 mg | ORAL_TABLET | Freq: Two times a day (BID) | ORAL | 1 refills | Status: DC

## 2016-11-15 NOTE — Telephone Encounter (Signed)
Pt called to inform [facility] that he needed a refill on his Eliquis 5mg .  Pt was last seen by Dr. Marlou Porch on 10/06/16, pt is age 83 years old, weight-83.6kg, Crea-0.79 on 04/30/16. Pt requested refill be sent to [facility] Delivery. Called pt back to inform him that the med was sent as requested to requested Pharmacy.

## 2016-11-16 DIAGNOSIS — L821 Other seborrheic keratosis: Secondary | ICD-10-CM | POA: Diagnosis not present

## 2016-11-16 DIAGNOSIS — Z85828 Personal history of other malignant neoplasm of skin: Secondary | ICD-10-CM | POA: Diagnosis not present

## 2016-11-16 DIAGNOSIS — L570 Actinic keratosis: Secondary | ICD-10-CM | POA: Diagnosis not present

## 2016-11-16 DIAGNOSIS — D1801 Hemangioma of skin and subcutaneous tissue: Secondary | ICD-10-CM | POA: Diagnosis not present

## 2016-11-16 DIAGNOSIS — D225 Melanocytic nevi of trunk: Secondary | ICD-10-CM | POA: Diagnosis not present

## 2016-11-17 ENCOUNTER — Telehealth: Payer: Self-pay

## 2016-11-17 NOTE — Telephone Encounter (Signed)
Patient states that his mail order refill request for for Eliquis has not been received by [facility] order, pt spoke with them on 3/20. Adv pt that the refill request was authorized by our Coumadin clinic on 11/15/16. Provide the pt the customer service number listed on the electronic confirmation we received. Adv pt that I will fwd a message to [facility] to resent refill. Pt state that he is down to his last 5 tabs. Adv pt that we do no currently have samples, nay need to send an Rx to a local pharmacy. Adv pt we do not want him to have an interruption in therapy, he should call the office to let us know if he will not be able to receive his medication in time so we can attempt to locate Elquis samples. Pt agreeable with plan and verbalized understanding. Message fwd to [facility].

## 2016-11-17 NOTE — Telephone Encounter (Signed)
Spoke with pt and he states he did have have more Eliquis 5mg  in the home but would need the refill to be done [facility] as they confirmed the refill sent on March 19 th and on calling them they did not find him in their system but state that [facility] does the filling of theEliquis and they do the home delivery So called [facility] and they found him in their system and state have sent out delivery of 60 tabs with 11 refills today This nurse does not know who did that refill but did call pt and advise him that [facility] states the refill for Eliquis has been sent out and instructed pt that  he needs to be sure that he gets CBC and BMET every 6 months to be sure that he remains on the correct dose and he states understanding

## 2016-11-18 ENCOUNTER — Other Ambulatory Visit: Payer: Self-pay | Admitting: *Deleted

## 2016-11-22 ENCOUNTER — Other Ambulatory Visit: Payer: Self-pay | Admitting: *Deleted

## 2016-11-22 MED ORDER — APIXABAN 5 MG PO TABS: 5.0000 mg | ORAL_TABLET | Freq: Two times a day (BID) | ORAL | 1 refills | Status: DC

## 2016-11-24 DIAGNOSIS — B369 Superficial mycosis, unspecified: Secondary | ICD-10-CM | POA: Diagnosis not present

## 2016-11-24 DIAGNOSIS — L97822 Non-pressure chronic ulcer of other part of left lower leg with fat layer exposed: Secondary | ICD-10-CM | POA: Diagnosis not present

## 2016-11-24 DIAGNOSIS — S81802A Unspecified open wound, left lower leg, initial encounter: Secondary | ICD-10-CM | POA: Diagnosis not present

## 2016-11-24 DIAGNOSIS — I10 Essential (primary) hypertension: Secondary | ICD-10-CM | POA: Diagnosis not present

## 2016-11-24 DIAGNOSIS — I2510 Atherosclerotic heart disease of native coronary artery without angina pectoris: Secondary | ICD-10-CM | POA: Diagnosis not present

## 2016-12-08 ENCOUNTER — Encounter (HOSPITAL_BASED_OUTPATIENT_CLINIC_OR_DEPARTMENT_OTHER): Payer: Medicare Other | Attending: Surgery

## 2016-12-08 DIAGNOSIS — I10 Essential (primary) hypertension: Secondary | ICD-10-CM | POA: Insufficient documentation

## 2016-12-08 DIAGNOSIS — S81802A Unspecified open wound, left lower leg, initial encounter: Secondary | ICD-10-CM | POA: Diagnosis not present

## 2016-12-08 DIAGNOSIS — Z87891 Personal history of nicotine dependence: Secondary | ICD-10-CM | POA: Diagnosis not present

## 2016-12-08 DIAGNOSIS — Z950 Presence of cardiac pacemaker: Secondary | ICD-10-CM | POA: Diagnosis not present

## 2016-12-08 DIAGNOSIS — I2510 Atherosclerotic heart disease of native coronary artery without angina pectoris: Secondary | ICD-10-CM | POA: Insufficient documentation

## 2016-12-08 DIAGNOSIS — Z8673 Personal history of transient ischemic attack (TIA), and cerebral infarction without residual deficits: Secondary | ICD-10-CM | POA: Insufficient documentation

## 2016-12-08 DIAGNOSIS — Z96659 Presence of unspecified artificial knee joint: Secondary | ICD-10-CM | POA: Diagnosis not present

## 2016-12-08 DIAGNOSIS — Z951 Presence of aortocoronary bypass graft: Secondary | ICD-10-CM | POA: Diagnosis not present

## 2016-12-08 DIAGNOSIS — L97122 Non-pressure chronic ulcer of left thigh with fat layer exposed: Secondary | ICD-10-CM | POA: Diagnosis not present

## 2016-12-08 DIAGNOSIS — I739 Peripheral vascular disease, unspecified: Secondary | ICD-10-CM | POA: Insufficient documentation

## 2016-12-13 DIAGNOSIS — I10 Essential (primary) hypertension: Secondary | ICD-10-CM | POA: Diagnosis not present

## 2016-12-13 DIAGNOSIS — Q253 Supravalvular aortic stenosis: Secondary | ICD-10-CM | POA: Diagnosis not present

## 2016-12-13 DIAGNOSIS — Z79899 Other long term (current) drug therapy: Secondary | ICD-10-CM | POA: Diagnosis not present

## 2016-12-13 DIAGNOSIS — F325 Major depressive disorder, single episode, in full remission: Secondary | ICD-10-CM | POA: Diagnosis not present

## 2016-12-14 DIAGNOSIS — H401131 Primary open-angle glaucoma, bilateral, mild stage: Secondary | ICD-10-CM | POA: Diagnosis not present

## 2016-12-14 DIAGNOSIS — H5340 Unspecified visual field defects: Secondary | ICD-10-CM | POA: Diagnosis not present

## 2016-12-22 DIAGNOSIS — Z950 Presence of cardiac pacemaker: Secondary | ICD-10-CM | POA: Diagnosis not present

## 2016-12-22 DIAGNOSIS — I2510 Atherosclerotic heart disease of native coronary artery without angina pectoris: Secondary | ICD-10-CM | POA: Diagnosis not present

## 2016-12-22 DIAGNOSIS — L97829 Non-pressure chronic ulcer of other part of left lower leg with unspecified severity: Secondary | ICD-10-CM | POA: Diagnosis not present

## 2016-12-22 DIAGNOSIS — L97122 Non-pressure chronic ulcer of left thigh with fat layer exposed: Secondary | ICD-10-CM | POA: Diagnosis not present

## 2016-12-22 DIAGNOSIS — I10 Essential (primary) hypertension: Secondary | ICD-10-CM | POA: Diagnosis not present

## 2016-12-22 DIAGNOSIS — I739 Peripheral vascular disease, unspecified: Secondary | ICD-10-CM | POA: Diagnosis not present

## 2016-12-22 DIAGNOSIS — Z8673 Personal history of transient ischemic attack (TIA), and cerebral infarction without residual deficits: Secondary | ICD-10-CM | POA: Diagnosis not present

## 2017-01-31 DIAGNOSIS — K51219 Ulcerative (chronic) proctitis with unspecified complications: Secondary | ICD-10-CM | POA: Diagnosis not present

## 2017-02-10 ENCOUNTER — Ambulatory Visit (INDEPENDENT_AMBULATORY_CARE_PROVIDER_SITE_OTHER): Payer: Medicare Other | Admitting: Internal Medicine

## 2017-02-10 ENCOUNTER — Encounter: Payer: Self-pay | Admitting: Internal Medicine

## 2017-02-10 VITALS — BP 116/60 | HR 59 | Ht 71.0 in | Wt 186.4 lb

## 2017-02-10 DIAGNOSIS — I442 Atrioventricular block, complete: Secondary | ICD-10-CM | POA: Diagnosis not present

## 2017-02-10 DIAGNOSIS — Z950 Presence of cardiac pacemaker: Secondary | ICD-10-CM

## 2017-02-10 DIAGNOSIS — I480 Paroxysmal atrial fibrillation: Secondary | ICD-10-CM | POA: Diagnosis not present

## 2017-02-10 DIAGNOSIS — I350 Nonrheumatic aortic (valve) stenosis: Secondary | ICD-10-CM

## 2017-02-10 LAB — CUP PACEART INCLINIC DEVICE CHECK
Battery Impedance: 495 Ohm
Battery Remaining Longevity: 84 mo
Battery Voltage: 2.79 V
Brady Statistic AP VP Percent: 45 %
Brady Statistic AS VP Percent: 54 %
Date Time Interrogation Session: 20180614134401
Implantable Lead Implant Date: 20110725
Implantable Lead Location: 753859
Implantable Lead Model: 5076
Implantable Pulse Generator Implant Date: 20110725
Lead Channel Impedance Value: 532 Ohm
Lead Channel Pacing Threshold Amplitude: 0.5 V
Lead Channel Pacing Threshold Pulse Width: 0.4 ms
Lead Channel Sensing Intrinsic Amplitude: 1 mV
Lead Channel Setting Pacing Amplitude: 2.5 V
Lead Channel Setting Pacing Pulse Width: 0.4 ms
MDC IDC LEAD IMPLANT DT: 20110725
MDC IDC LEAD LOCATION: 753860
MDC IDC MSMT LEADCHNL RV IMPEDANCE VALUE: 690 Ohm
MDC IDC MSMT LEADCHNL RV PACING THRESHOLD AMPLITUDE: 0.75 V
MDC IDC MSMT LEADCHNL RV PACING THRESHOLD PULSEWIDTH: 0.4 ms
MDC IDC MSMT LEADCHNL RV SENSING INTR AMPL: 15.68 mV
MDC IDC SET LEADCHNL RA PACING AMPLITUDE: 2 V
MDC IDC SET LEADCHNL RV SENSING SENSITIVITY: 2 mV
MDC IDC STAT BRADY AP VS PERCENT: 0 %
MDC IDC STAT BRADY AS VS PERCENT: 1 %

## 2017-02-10 NOTE — Progress Notes (Signed)
HPI  [name] returns today for ongoing evaluation and management of his PPM. He is a pleasant elderly man with a h/o symptomatic bradycardia, s/p PPM insertion. He has HTN and dyslipidemia. He has coronary artery disease and a remote TIA. Marland KitchenHe has PAF which has been demonstrated on his PPM. He has been reluctant to take any anti-coagulation other than ASA due to his UC. However, I encouraged him to consider Eliquis and he has done so and appears to be tolerating the medication. He has developed severe AS with a mean gradient of almost 50 but denies any symptoms. He has chronic edema but denies chest pain, sob, or syncope.  Allergies  Allergen Reactions  . No Known Allergies      Current Outpatient Prescriptions  Medication Sig Dispense Refill  . amLODipine (NORVASC) 5 MG tablet Take 1 tablet (5 mg total) by mouth daily. 90 tablet 2  . apixaban (ELIQUIS) 5 MG TABS tablet Take 1 tablet (5 mg total) by mouth 2 (two) times daily. 180 tablet 1  . atorvastatin (LIPITOR) 20 MG tablet Take 20 mg by mouth daily.    . balsalazide (COLAZAL) 750 MG capsule Take 2,250 mg by mouth 3 (three) times daily.    [name] escitalopram (LEXAPRO) 10 MG tablet Take 10 mg by mouth daily.      . finasteride (PROSCAR) 5 MG tablet Take 5 mg by mouth daily.     . furosemide (LASIX) 40 MG tablet Take 40 mg by mouth 2 (two) times daily.    [name] latanoprost (XALATAN) 0.005 % ophthalmic solution Place 1 drop into both eyes at bedtime.     [name] losartan (COZAAR) 100 MG tablet Take 100 mg by mouth daily.     . metoprolol succinate (TOPROL-XL) 50 MG 24 hr tablet Take 50 mg by mouth daily. Take with or immediately following a meal.    . Multiple Vitamin (MULTIVITAMIN) tablet Take 1 tablet by mouth daily.      . potassium chloride (KLOR-CON) 10 MEQ CR tablet Take 10 mEq by mouth 2 (two) times daily.       No current facility-administered medications for this visit.      Past Medical History:  Diagnosis Date  . Anemia    mild, hemoglobin 11.5  . Anxiety state, unspecified   . Aortic valve disorders   . Aortic valve stenosis   . Arthritis   . Atrioventricular block, complete ([place])   . Cardiac pacemaker in situ    3rd heart block  . CMV (cytomegalovirus infection) ([place]) 04/2010  . Colitis   . Coronary atherosclerosis of native coronary artery   . Dyslipidemia   . Edema   . Embolic stroke ([place])    right hemispheric embolic event  . Essential hypertension   . Functional diarrhea   . Heart murmur   . Hypercholesteremia   . Intestinal infection due to Clostridium difficile 03/2010  . Left sided ulcerative (chronic) colitis ([place])   . Mechanical complication due to cardiac pacemaker (electrode)   . Nonspecific abnormal finding in stool contents   . Occlusion and stenosis of carotid artery with cerebral infarction   . PAF (paroxysmal atrial fibrillation) ([facility])    Hx of post op PAF  . Positive for microalbuminuria    On Benicar  . Postsurgical aortocoronary bypass status    3 vessel CAD, s/p CABG 04/1998  . Presence of permanent cardiac pacemaker   . PVD (peripheral vascular disease) ([place])   .  Seasonal allergies   . Sick sinus syndrome ([place])   . Sigmoid diverticulosis   . TIA (transient ischemic attack)   . Ulcerative (chronic) enterocolitis ([place])   . 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 Ulcerative colitis     ROS:   All systems reviewed and negative except as noted in the HPI.   Past Surgical History:  Procedure Laterality Date  . CARDIAC CATHETERIZATION  1999  . CAROTID ENDARTERECTOMY Right 2004  . CATARACT EXTRACTION, BILATERAL  2011  . CORONARY ARTERY BYPASS GRAFT  04/1998   3 vessel CAD  . INCISION AND DRAINAGE OF WOUND Left 05/04/2016   Procedure: SURGICAL PREP FOR GRAFTING LEFT LEG WITH THERA SKIN APPLICATION;  Surgeon: Irene Limbo, MD;  Location: [facility];  Service: Plastics;  Laterality: Left;  . INTRAOPERATIVE ARTERIOGRAM   06/2000  . PACEMAKER INSERTION  03/25/2010  . TOTAL KNEE ARTHROPLASTY  07/16/09  . TRANSURETHRAL RESECTION OF PROSTATE  1998     Family History  Problem Relation Age of Onset  . Congestive Heart Failure Mother   . Leukemia Father   . Leukemia Unknown   . Heart failure Unknown   . Cancer Unknown      Social History   Social History  . Marital status: Married    Spouse name: N/A  . Number of children: 3  . Years of education: N/A   Occupational History  . retired    Social History Main Topics  . Smoking status: Former Smoker    Years: 10.00    Types: Cigarettes    Quit date: 08/30/1952  . Smokeless tobacco: Former Systems developer  . Alcohol use No  . Drug use: No  . Sexual activity: Not on file   Other Topics Concern  . Not on file   Social History Narrative  . No narrative on file     BP 116/60   Pulse (!) 59   Ht 5' 11"  (1.803 m)   Wt 186 lb 6.4 oz (84.6 kg)   SpO2 98%   BMI 26.00 kg/m    Physical Exam:  Well appearing elderly man, looking younger than his stated age, NAD HEENT: Unremarkable Neck:  No JVD, no thyromegally Back:  No CVA tenderness Lungs:  Clear with no wheezes HEART:  Regular rate rhythm, no rubs, no clicks but a 3/6 systolic murmur, A2 is reduced Abd:  soft, positive bowel sounds, no organomegally, no rebound, no guarding Ext:  2 plus pulses, no edema, no cyanosis, no clubbing Skin:  No rashes no nodules Neuro:  CN II through XII intact, motor grossly intact   DEVICE  Normal device function.  See PaceArt for details. Atrial fib is present for upto 8 hours at a time.   Assess/Plan: 1. Atrial fib - he appears to be tolerating his systemic anti-coagulation. He is in NSR almost 99% of the time. 2. PPM - his medtronic DDD PM is working normally. Will recheck in several months. 3. CAD, s/p CABG - he remains active exercising regularly and has no anginal symptoms. Will follow. 4. HTN - his blood pressure is good today. Will follow. 5. Aortic  stenosis - he has surgical disease but denies symptoms. He is instructed to call us if he develops chest pain, sob, or syncope.  [name].

## 2017-02-10 NOTE — Patient Instructions (Addendum)
Medication Instructions:  Your physician recommends that you continue on your current medications as directed. Please refer to the Current Medication list given to you today.   Labwork: None Ordered   Testing/Procedures: None Ordered   Follow-Up: Your physician wants you to follow-up in: 1 year with Dr. Lovena Le. You will receive a reminder letter in the mail two months in advance. If you don't receive a letter, please call our office to schedule the follow-up appointment.  Remote monitoring is used to monitor your Pacemaker from home. This monitoring reduces the number of office visits required to check your device to one time per year. It allows [place] to keep an eye on the functioning of your device to ensure it is working properly. You are scheduled for a device check from home on  05/12/17 . You may send your transmission at any time that day. If you have a wireless device, the transmission will be sent automatically. After your physician reviews your transmission, you will receive a postcard with your next transmission date.    Any Other Special Instructions Will Be Listed Below (If Applicable). Call Dr. Marlou Porch or Dr. Lovena Le if you have chest pain, shortness of breath, or pass out. [phone]    If you need a refill on your cardiac medications before your next appointment, please call your pharmacy.

## 2017-03-19 ENCOUNTER — Emergency Department (HOSPITAL_COMMUNITY): Payer: Medicare Other

## 2017-03-19 ENCOUNTER — Encounter (HOSPITAL_COMMUNITY): Payer: Self-pay | Admitting: Emergency Medicine

## 2017-03-19 ENCOUNTER — Emergency Department (HOSPITAL_COMMUNITY)
Admission: EM | Admit: 2017-03-19 | Discharge: 2017-03-19 | Disposition: A | Discharge status: Home / Self Care | Payer: Medicare Other | Attending: Emergency Medicine | Admitting: Emergency Medicine

## 2017-03-19 DIAGNOSIS — Z950 Presence of cardiac pacemaker: Secondary | ICD-10-CM | POA: Insufficient documentation

## 2017-03-19 DIAGNOSIS — Y939 Activity, unspecified: Secondary | ICD-10-CM | POA: Diagnosis not present

## 2017-03-19 DIAGNOSIS — W0110XA Fall on same level from slipping, tripping and stumbling with subsequent striking against unspecified object, initial encounter: Secondary | ICD-10-CM | POA: Diagnosis not present

## 2017-03-19 DIAGNOSIS — S0003XA Contusion of scalp, initial encounter: Secondary | ICD-10-CM | POA: Insufficient documentation

## 2017-03-19 DIAGNOSIS — Z951 Presence of aortocoronary bypass graft: Secondary | ICD-10-CM | POA: Diagnosis not present

## 2017-03-19 DIAGNOSIS — Z8673 Personal history of transient ischemic attack (TIA), and cerebral infarction without residual deficits: Secondary | ICD-10-CM | POA: Insufficient documentation

## 2017-03-19 DIAGNOSIS — Y929 Unspecified place or not applicable: Secondary | ICD-10-CM | POA: Diagnosis not present

## 2017-03-19 DIAGNOSIS — S299XXA Unspecified injury of thorax, initial encounter: Secondary | ICD-10-CM | POA: Diagnosis not present

## 2017-03-19 DIAGNOSIS — Z7901 Long term (current) use of anticoagulants: Secondary | ICD-10-CM | POA: Insufficient documentation

## 2017-03-19 DIAGNOSIS — Z79899 Other long term (current) drug therapy: Secondary | ICD-10-CM | POA: Diagnosis not present

## 2017-03-19 DIAGNOSIS — I2510 Atherosclerotic heart disease of native coronary artery without angina pectoris: Secondary | ICD-10-CM | POA: Diagnosis not present

## 2017-03-19 DIAGNOSIS — Y999 Unspecified external cause status: Secondary | ICD-10-CM | POA: Insufficient documentation

## 2017-03-19 DIAGNOSIS — S098XXA Other specified injuries of head, initial encounter: Secondary | ICD-10-CM | POA: Diagnosis present

## 2017-03-19 DIAGNOSIS — W19XXXA Unspecified fall, initial encounter: Secondary | ICD-10-CM

## 2017-03-19 DIAGNOSIS — Z87891 Personal history of nicotine dependence: Secondary | ICD-10-CM | POA: Diagnosis not present

## 2017-03-19 DIAGNOSIS — S0990XA Unspecified injury of head, initial encounter: Secondary | ICD-10-CM | POA: Diagnosis not present

## 2017-03-19 DIAGNOSIS — R9431 Abnormal electrocardiogram [ECG] [EKG]: Secondary | ICD-10-CM | POA: Diagnosis not present

## 2017-03-19 LAB — BASIC METABOLIC PANEL
Anion gap: 10 (ref 5–15)
BUN: 18 mg/dL (ref 6–20)
CHLORIDE: 100 mmol/L — AB (ref 101–111)
CO2: 25 mmol/L (ref 22–32)
Calcium: 9.4 mg/dL (ref 8.9–10.3)
Creatinine, Ser: 0.89 mg/dL (ref 0.61–1.24)
Glucose, Bld: 144 mg/dL — ABNORMAL HIGH (ref 65–99)
POTASSIUM: 4 mmol/L (ref 3.5–5.1)
SODIUM: 135 mmol/L (ref 135–145)

## 2017-03-19 LAB — CBC WITH DIFFERENTIAL/PLATELET
BASOS ABS: 0 10*3/uL (ref 0.0–0.1)
BASOS PCT: 0 %
EOS ABS: 0 10*3/uL (ref 0.0–0.7)
EOS PCT: 0 %
HCT: 41.5 % (ref 39.0–52.0)
HEMOGLOBIN: 13.8 g/dL (ref 13.0–17.0)
Lymphocytes Relative: 5 %
Lymphs Abs: 0.7 10*3/uL (ref 0.7–4.0)
MCH: 30.8 pg (ref 26.0–34.0)
MCHC: 33.3 g/dL (ref 30.0–36.0)
MCV: 92.6 fL (ref 78.0–100.0)
Monocytes Absolute: 0.7 10*3/uL (ref 0.1–1.0)
Monocytes Relative: 5 %
NEUTROS PCT: 90 %
Neutro Abs: 13.5 10*3/uL — ABNORMAL HIGH (ref 1.7–7.7)
PLATELETS: 158 10*3/uL (ref 150–400)
RBC: 4.48 MIL/uL (ref 4.22–5.81)
RDW: 14.1 % (ref 11.5–15.5)
WBC: 14.9 10*3/uL — AB (ref 4.0–10.5)

## 2017-03-19 LAB — URINALYSIS, ROUTINE W REFLEX MICROSCOPIC
Bacteria, UA: NONE SEEN
Bilirubin Urine: NEGATIVE
GLUCOSE, UA: NEGATIVE mg/dL
HGB URINE DIPSTICK: NEGATIVE
KETONES UR: NEGATIVE mg/dL
NITRITE: NEGATIVE
PH: 6 (ref 5.0–8.0)
PROTEIN: 100 mg/dL — AB
Specific Gravity, Urine: 1.02 (ref 1.005–1.030)

## 2017-03-19 NOTE — Discharge Instructions (Signed)
Chest x-ray and urine sample were acceptable. Ice pack to head. Increase fluids. Encourage eating. Follow-up your primary care doctor or return if worse.

## 2017-03-19 NOTE — ED Provider Notes (Signed)
[facility] DEPT Provider Note   CSN: [id number] Arrival date & time: 03/19/17  2013     History   Chief Complaint Chief Complaint  Patient presents with  . Fall    HPI Dalton Townsend is a 83 y.o. male.  Level V caveat for slight confusion. [place] and was excessively tired. He accidentally tripped and fell striking his head. He is on Eliquis.  No prodromal illnesses. Specifically, no chest pain, dyspnea, dysuria, neck pain, fever, sweats, chills      Past Medical History:  Diagnosis Date  . Anemia    mild, hemoglobin 11.5  . Anxiety state, unspecified   . Aortic valve disorders   . Aortic valve stenosis   . Arthritis   . Atrioventricular block, complete ([place])   . Cardiac pacemaker in situ    3rd heart block  . CMV (cytomegalovirus infection) ([place]) 04/2010  . Colitis   . Coronary atherosclerosis of native coronary artery   . Dyslipidemia   . Edema   . Embolic stroke ([place])    right hemispheric embolic event  . Essential hypertension   . Functional diarrhea   . Heart murmur   . Hypercholesteremia   . Intestinal infection due to Clostridium difficile 03/2010  . Left sided ulcerative (chronic) colitis ([place])   . Mechanical complication due to cardiac pacemaker (electrode)   . Nonspecific abnormal finding in stool contents   . Occlusion and stenosis of carotid artery with cerebral infarction   . PAF (paroxysmal atrial fibrillation) (HCC)    Hx of post op PAF  . Positive for microalbuminuria    On Benicar  . Postsurgical aortocoronary bypass status    3 vessel CAD, s/p CABG 04/1998  . Presence of permanent cardiac pacemaker   . PVD (peripheral vascular disease) ([place])   . Seasonal allergies   . Sick sinus syndrome ([place])   . Sigmoid diverticulosis   . TIA (transient ischemic attack)   . Ulcerative (chronic) enterocolitis ([place])   . 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 Ulcerative colitis     Patient Active Problem List   Diagnosis Date Noted  . Aortic stenosis 01/04/2014  . Atrial fibrillation ([place]) 11/22/2013  . Pacemaker 11/22/2013  . CLOSTRIDIUM DIFFICILE COLITIS 06/24/2010  . Essential hypertension 06/24/2010  . Ulcerative colitis ([place]) 06/24/2010  . CYTOMEGALOVIRAL DISEASE 05/21/2010    Past Surgical History:  Procedure Laterality Date  . CARDIAC CATHETERIZATION  1999  . CAROTID ENDARTERECTOMY Right 2004  . CATARACT EXTRACTION, BILATERAL  2011  . CORONARY ARTERY BYPASS GRAFT  04/1998   3 vessel CAD  . INCISION AND DRAINAGE OF WOUND Left 05/04/2016   Procedure: SURGICAL PREP FOR GRAFTING LEFT LEG WITH THERA SKIN APPLICATION;  Surgeon: Irene Limbo, MD;  Location: [facility];  Service: Plastics;  Laterality: Left;  . INTRAOPERATIVE ARTERIOGRAM  06/2000  . PACEMAKER INSERTION  03/25/2010  . TOTAL KNEE ARTHROPLASTY  07/16/09  . TRANSURETHRAL RESECTION OF PROSTATE  1998       Home Medications    Prior to Admission medications   Medication Sig Start Date End Date Taking? Authorizing Provider  amLODipine (NORVASC) 5 MG tablet Take 1 tablet (5 mg total) by mouth daily. 04/28/15   Evans Lance, MD  apixaban (ELIQUIS) 5 MG TABS tablet Take 1 tablet (5 mg total) by mouth 2 (two) times daily. 11/22/16   Jerline Pain, MD  atorvastatin (LIPITOR) 20 MG tablet Take 20 mg by mouth daily.    [provider]  balsalazide (COLAZAL) 750 MG capsule Take 2,250 mg by mouth 3 (three) times daily.    [provider]  escitalopram (LEXAPRO) 10 MG tablet Take 10 mg by mouth daily.      [provider]  finasteride (PROSCAR) 5 MG tablet Take 5 mg by mouth daily.  01/13/15   [provider]  furosemide (LASIX) 40 MG tablet Take 40 mg by mouth 2 (two) times daily.    [provider]  latanoprost (XALATAN) 0.005 % ophthalmic solution Place 1 drop into both eyes at bedtime.     [provider]  losartan (COZAAR) 100 MG tablet Take 100 mg by mouth daily.  10/13/13   [provider]  metoprolol succinate (TOPROL-XL) 50 MG 24 hr tablet Take 50 mg by mouth daily. Take with or immediately following a meal.    [provider]  Multiple Vitamin (MULTIVITAMIN) tablet Take 1 tablet by mouth daily.      [provider]  potassium chloride (KLOR-CON) 10 MEQ CR tablet Take 10 mEq by mouth 2 (two) times daily.      [provider]    Family History Family History  Problem Relation Age of Onset  . Congestive Heart Failure Mother   . Leukemia Father   . Leukemia Unknown   . Heart failure Unknown   . Cancer Unknown     Social History Social History  Substance Use Topics  . Smoking status: Former Smoker    Years: 10.00    Types: Cigarettes    Quit date: 08/30/1952  . Smokeless tobacco: Former Systems developer  . Alcohol use No     Allergies   No known allergies   Review of Systems Review of Systems  Reason unable to perform ROS: Slight confusion.     Physical Exam Updated Vital Signs BP (!) 164/77 (BP Location: Left Arm)   Pulse 95   Temp 98.7 F (37.1 C) (Oral)   Resp 18   Ht 5\' 11"  (1.803 m)   Wt 77.1 kg (170 lb)   SpO2 94%   BMI 23.71 kg/m   Physical Exam  Constitutional: He appears well-developed and well-nourished.  HENT:  Head: Normocephalic.  Hematoma left occipital scalp approximately 3-4 cm in diameter  Eyes: Conjunctivae are normal.  Neck: Neck supple.  Cardiovascular: Normal rate and regular rhythm.   Pulmonary/Chest: Effort normal and breath sounds normal.  Abdominal: Soft. Bowel sounds are normal.  Musculoskeletal: Normal range of motion.  Neurological: He is alert.  Skin: Skin is warm and dry.  Psychiatric: He has a normal mood and affect. His behavior is normal.  Nursing note and vitals reviewed.    ED Treatments / Results  Labs (all labs ordered are listed, but only abnormal results are  displayed) Labs Reviewed  CBC WITH DIFFERENTIAL/PLATELET - Abnormal; Notable for the following:       Result Value   WBC 14.9 (*)    Neutro Abs 13.5 (*)    All other components within normal limits  BASIC METABOLIC PANEL - Abnormal; Notable for the following:    Chloride 100 (*)    Glucose, Bld 144 (*)    All other components within normal limits  URINALYSIS, ROUTINE W REFLEX MICROSCOPIC - Abnormal; Notable for the following:    Protein, ur 100 (*)    Leukocytes, UA TRACE (*)    Squamous Epithelial / LPF  0-5 (*)    All other components within normal limits    EKG  EKG Interpretation  Date/Time:  Saturday March 19 2017 20:38:51 EDT Ventricular Rate:  90 PR Interval:    QRS Duration: 183 QT Interval:  437 QTC Calculation: 535 R Axis:   -77 Text Interpretation:  Sinus rhythm LVH with IVCD, LAD and secondary repol abnrm Inferior infarct, acute (RCA) Lateral leads are also involved Prolonged QT interval Probable RV involvement, suggest recording right precordial leads Confirmed by Nat Christen [phone]) on 03/19/2017 8:50:08 PM       Radiology Dg Chest 2 View  Result Date: 03/19/2017 CLINICAL DATA:  Fall.  Weakness. EXAM: CHEST  2 VIEW COMPARISON:  Frontal and lateral views 01/26/2013 FINDINGS: Multi lead left-sided pacemaker in place. Patient is post median sternotomy and CABG. Cardiomegaly mediastinal contours are unchanged with tortuosity and atherosclerosis of the thoracic aorta. Linear scarring in the right mid lung. There is left apical pleuroparenchymal scarring. Mild right infrahilar atelectasis. Possible trace bilateral pleural effusions. No pulmonary edema. No pneumothorax. Bones are under mineralized. No evidence of acute osseous abnormality. IMPRESSION: 1. No definite acute traumatic findings. 2. Post CABG with cardiomegaly and tortuous atherosclerotic thoracic aorta. 3. Possible small bilateral pleural effusions. 4. Mild right infrahilar atelectasis. Electronically Signed   By:  Jeb Levering M.D.   On: 03/19/2017 22:14   Ct Head Wo Contrast  Result Date: 03/19/2017 CLINICAL DATA:  Fall.  Weakness.  Occipital region injury. EXAM: CT HEAD WITHOUT CONTRAST TECHNIQUE: Contiguous axial images were obtained from the base of the skull through the vertex without intravenous contrast. COMPARISON:  None available FINDINGS: Brain: No evidence of acute infarction, hemorrhage, hydrocephalus, extra-axial collection or mass lesion/mass effect. 2 small remote cortical infarcts along the high right frontal cortex and subjacent white matter. Moderate generalized atrophy. Vascular: Atherosclerotic calcification. Skull: There is mild scalp swelling near the vertex without underlying fracture. No opaque foreign body. Sinuses/Orbits: Negative IMPRESSION: 1. No evidence of intracranial injury. 2. Scalp swelling at the vertex without fracture. 3. Atrophy and small remote right frontal infarcts. Electronically Signed   By: Monte Fantasia M.D.   On: 03/19/2017 21:51    Procedures Procedures (including critical care time)  Medications Ordered in ED Medications - No data to display   Initial Impression / Assessment and Plan / ED Course  I have reviewed the triage vital signs and the nursing notes.  Pertinent labs & imaging results that were available during my care of the patient were reviewed by me and considered in my medical decision making (see chart for details).     Patient's mentation has improved. CT head shows no subdural hematoma. Chest x-ray and urinalysis show no obvious infection. I discussed these findings with the family including the daughter and wife. They're willing to take him home and observe him for any deterioration in his condition. They understand to return for mental status changes.  Final Clinical Impressions(s) / ED Diagnoses   Final diagnoses:  Fall, initial encounter  Hematoma of scalp, initial encounter    New Prescriptions New Prescriptions   No  medications on file     Nat Christen, MD 03/19/17 [id number]

## 2017-03-19 NOTE — ED Triage Notes (Signed)
Pt reports falling around 1900 tonight, denies LOC, states he hit his head, does take eliquis 5mg  2x a day. A&O x 4. Ambulates with a cane. Denies pain.

## 2017-04-26 ENCOUNTER — Encounter: Payer: Self-pay | Admitting: Cardiology

## 2017-04-26 ENCOUNTER — Ambulatory Visit (INDEPENDENT_AMBULATORY_CARE_PROVIDER_SITE_OTHER): Payer: Medicare Other | Admitting: Cardiology

## 2017-04-26 VITALS — BP 150/88 | HR 82 | Ht 71.0 in | Wt 187.2 lb

## 2017-04-26 DIAGNOSIS — I480 Paroxysmal atrial fibrillation: Secondary | ICD-10-CM | POA: Diagnosis not present

## 2017-04-26 DIAGNOSIS — Z950 Presence of cardiac pacemaker: Secondary | ICD-10-CM | POA: Diagnosis not present

## 2017-04-26 DIAGNOSIS — I350 Nonrheumatic aortic (valve) stenosis: Secondary | ICD-10-CM | POA: Diagnosis not present

## 2017-04-26 NOTE — Progress Notes (Signed)
Dalton Townsend. [address], [address], [place]  [address] Phone: [phone] Fax:  [phone]  Date:  04/26/2017   ID:  ASHISH ROSSETTI, DOB [birth year], MRN [id number]  PCP:  Lajean Manes, MD   History of Present Illness: Dalton Townsend is a 83 y.o. male with coronary artery disease status post CABG, severe aortic stenosis, pacemaker insertion on 03/23/10, subsequent battery change out and RV lead failure here for followup. Has underlying AV block. He had a brief automatic mode switching less than 30 seconds. overall, doing well. Dr.Taylor  On 10/06/16 we had lengthy discussion about TAVR  Three-vessel coronary artery disease status post bypass in 1999-no evidence of angina.   Prior history of postoperative atrial fibrillation-paroxysmal. No further occurrence. Not on anticoagulation and would like to contemplate this;does not want to start treatment . Recent pacemaker check normal.   Has ulcerative colitis, severe.  Followed by Dr. Cristina Gong.  Has aortic murmur, previously followed with echocardiography-no syncope, no angina, dyspnea. His hypertension has been an issue in the past. He reports 130s/90s at home.    He goes to gym almost daily - fitness training, aerobic exercises.  Had a leg skin graft on 05/04/16 for non healing wound.   Wt Readings from Last 3 Encounters:  04/26/17 187 lb 3.2 oz (84.9 kg)  03/19/17 170 lb (77.1 kg)  02/10/17 186 lb 6.4 oz (84.6 kg)     Past Medical History:  Diagnosis Date  . Anemia    mild, hemoglobin 11.5  . Anxiety state, unspecified   . Aortic valve disorders   . Aortic valve stenosis   . Arthritis   . Atrioventricular block, complete ([place])   . Cardiac pacemaker in situ    3rd heart block  . CMV (cytomegalovirus infection) ([place]) 04/2010  . Colitis   . Coronary atherosclerosis of native coronary artery   . Dyslipidemia   . Edema   . Embolic stroke ([place])    right hemispheric embolic event  . Essential hypertension   .  Functional diarrhea   . Heart murmur   . Hypercholesteremia   . Intestinal infection due to Clostridium difficile 03/2010  . Left sided ulcerative (chronic) colitis ([place])   . Mechanical complication due to cardiac pacemaker (electrode)   . Nonspecific abnormal finding in stool contents   . Occlusion and stenosis of carotid artery with cerebral infarction   . PAF (paroxysmal atrial fibrillation) (HCC)    Hx of post op PAF  . Positive for microalbuminuria    On Benicar  . Postsurgical aortocoronary bypass status    3 vessel CAD, s/p CABG 04/1998  . Presence of permanent cardiac pacemaker   . PVD (peripheral vascular disease) ([place])   . Seasonal allergies   . Sick sinus syndrome ([place])   . Sigmoid diverticulosis   . TIA (transient ischemic attack)   . Ulcerative (chronic) enterocolitis ([place])   . 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 Ulcerative colitis     Past Surgical History:  Procedure Laterality Date  . CARDIAC CATHETERIZATION  1999  . CAROTID ENDARTERECTOMY Right 2004  . CATARACT EXTRACTION, BILATERAL  2011  . CORONARY ARTERY BYPASS GRAFT  04/1998   3 vessel CAD  . INCISION AND DRAINAGE OF WOUND Left 05/04/2016   Procedure: SURGICAL PREP FOR GRAFTING LEFT LEG WITH THERA SKIN APPLICATION;  Surgeon: Irene Limbo, MD;  Location: [facility];  Service: Plastics;  Laterality: Left;  .  INTRAOPERATIVE ARTERIOGRAM  06/2000  . PACEMAKER INSERTION  03/25/2010  . TOTAL KNEE ARTHROPLASTY  07/16/09  . TRANSURETHRAL RESECTION OF PROSTATE  1998    Current Outpatient Prescriptions  Medication Sig Dispense Refill  . amLODipine (NORVASC) 5 MG tablet Take 1 tablet (5 mg total) by mouth daily. 90 tablet 2  . apixaban (ELIQUIS) 5 MG TABS tablet Take 1 tablet (5 mg total) by mouth 2 (two) times daily. 180 tablet 1  . atorvastatin (LIPITOR) 20 MG tablet Take 20 mg by mouth daily.    . balsalazide (COLAZAL) 750 MG capsule Take 2,250 mg by mouth 3  (three) times daily.    [name] escitalopram (LEXAPRO) 10 MG tablet Take 10 mg by mouth daily.      . finasteride (PROSCAR) 5 MG tablet Take 5 mg by mouth daily.     . furosemide (LASIX) 40 MG tablet Take 40 mg by mouth 2 (two) times daily.    [name] latanoprost (XALATAN) 0.005 % ophthalmic solution Place 1 drop into both eyes at bedtime.     [name] losartan (COZAAR) 100 MG tablet Take 100 mg by mouth daily.     . metoprolol succinate (TOPROL-XL) 50 MG 24 hr tablet Take 50 mg by mouth daily. Take with or immediately following a meal.    . Multiple Vitamin (MULTIVITAMIN) tablet Take 1 tablet by mouth daily.      . potassium chloride (KLOR-CON) 10 MEQ CR tablet Take 10 mEq by mouth 2 (two) times daily.       No current facility-administered medications for this visit.     Allergies:    Allergies  Allergen Reactions  . No Known Allergies     Social History:  The patient  reports that he quit smoking about 64 years ago. His smoking use included Cigarettes. He quit after 10.00 years of use. He has quit using smokeless tobacco. He reports that he does not drink alcohol or use drugs.   Family History  Problem Relation Age of Onset  . Congestive Heart Failure Mother   . Leukemia Father   . Leukemia Unknown   . Heart failure Unknown   . Cancer Unknown     ROS:  Please see the history of present illness.   No shortness of breath, no chest pain, syncope.  Occasional blood in stool.  + LE edema.  All other systems reviewed and negative.   PHYSICAL EXAM: VS:  BP (!) 150/88   Pulse 82   Ht 5' 11"  (1.803 m)   Wt 187 lb 3.2 oz (84.9 kg)   BMI 26.11 kg/m  GEN: Well nourished, well developed, in no acute distress, cane.  HEENT: normal  Neck: no JVD, carotid bruits, or masses Cardiac: RRR; 3/6 SM harsh, no rubs, or gallops,no edema  Respiratory:  clear to auscultation bilaterally, normal work of breathing GI: soft, nontender, nondistended, + BS MS: no deformity or atrophy  Skin: warm and dry, no rash,  left leg compression wrap Neuro:  Alert and Oriented x 3, Strength and sensation are intact Psych: euthymic mood, full affect   EKG:  01/04/14 to Ventricular paced rhythm Labs: Creatinine 0.88 - Hg 14.4 11/14  ECHO: 05/22/14:  - Left ventricle: The cavity size was normal. There was mild focal basal hypertrophy of the septum. Systolic function was normal. The estimated ejection fraction was in the range of 60% to 65%. Wall motion was normal; there were no regional wall motion abnormalities. Doppler parameters are consistent with abnormal left ventricular  relaxation (grade 1 diastolic dysfunction). - Aortic valve: There was moderate stenosis. There was mild regurgitation. Peak velocity (S): 348 cm/s. Mean gradient (S): 32 mm Hg. Peak gradient (S): 48 mm Hg. - Mitral valve: Calcified annulus. - Right ventricle: The cavity size was mildly dilated. Wall thickness was normal.  Echocardiogram 08/10/16  - Left ventricle: The cavity size was normal. Wall thickness was   increased in a pattern of mild LVH. Systolic function was normal.   The estimated ejection fraction was in the range of 55% to 60%.   Wall motion was normal; there were no regional wall motion   abnormalities. Doppler parameters are consistent with abnormal   left ventricular relaxation (grade 1 diastolic dysfunction).   Doppler parameters are consistent with high ventricular filling   pressure. - Ventricular septum: Septal motion showed dyssynergy. These   changes are consistent with right ventricular pacing. - Aortic valve: Valve mobility was restricted. There was severe   stenosis. There was mild regurgitation. Mean gradient (S): 47 mm   Hg. Peak gradient (S): 81 mm Hg. - Mitral valve: Calcified annulus. - Left atrium: The atrium was mildly dilated. - Right ventricle: The cavity size was mildly dilated. - Right atrium: The atrium was mildly dilated.  Impressions:  - Normal LV systolic function;  grade 1 diastolic dysfunction;   calcified aortic valve with fusion of noncoronary and left cusps;   severe AS with mean gradient 47 mmHg; mild AI; mild LAE/RAE/RVE.  ASSESSMENT AND PLAN:  1. Paroxysmal atrial fibrillation-Eliquis.  His daughter is a Software engineer here at [facility] and he will discuss this with her. He has ulcerative colitis and in the past has had bleeding from the anus. He is concerned about this. Hs not had any further bleeding.  2. CAD - post CABG 3. Difficulty healing left leg superficial wound.-Seeing plastics, skin grafting, wound care. Doing better. Offered him consultative visit with [facility] specialist, he declined.  4. Pacemaker-functioning well. 5. Hypertension-degree of whitecoat hypertension. Normally reasonably controlled at home.  6. Severe aortic stenosis-detected on echocardiogram, murmur quite harsh. we had lengthy discussion today about potential TAVR again . He admits that he is not having any symptoms currently. He is still going to the [facility], exercising. No shortness of breath, no syncope, no chest pain. We will closely follow him. His daughter Peg, pharmacist at [facility] was present previously for discussion. Will repeat echo. 7. 6 month followup.  Signed, Candee Furbish, MD [facility]  04/26/2017 10:17 AM

## 2017-04-26 NOTE — Patient Instructions (Signed)
Medication Instructions:  Your physician recommends that you continue on your current medications as directed. Please refer to the Current Medication list given to you today.   Labwork: None Ordered   Testing/Procedures: Your physician has requested that you have an echocardiogram. Echocardiography is a painless test that uses sound waves to create images of your heart. It provides your doctor with information about the size and shape of your heart and how well your heart's chambers and valves are working. This procedure takes approximately one hour. There are no restrictions for this procedure. Please schedule this for mid October.  Follow-Up: Your physician wants you to follow-up in: 6 months with Dr. Marlou Porch. You will receive a reminder letter in the mail two months in advance. If you don't receive a letter, please call our office to schedule the follow-up appointment.   Any Other Special Instructions Will Be Listed Below (If Applicable).     If you need a refill on your cardiac medications before your next appointment, please call your pharmacy.

## 2017-05-12 ENCOUNTER — Encounter: Payer: Medicare Other | Admitting: *Deleted

## 2017-05-12 ENCOUNTER — Telehealth: Payer: Self-pay | Admitting: Cardiology

## 2017-05-12 NOTE — Telephone Encounter (Signed)
LMOVM reminding pt to send remote transmission.   

## 2017-05-13 ENCOUNTER — Encounter: Payer: Self-pay | Admitting: Cardiology

## 2017-05-16 ENCOUNTER — Telehealth: Payer: Self-pay | Admitting: Internal Medicine

## 2017-05-16 NOTE — Telephone Encounter (Signed)
New message    Pt is calling returning call to [place].

## 2017-05-17 NOTE — Telephone Encounter (Signed)
Spoke with pt, he stated that his old monitor had broken and that he had called [employer] and they had shipped out a new monitor on 9/14, pt stated that he talked to [place] and he would send in a transmission when he gets the new monitor.

## 2017-05-24 ENCOUNTER — Ambulatory Visit (INDEPENDENT_AMBULATORY_CARE_PROVIDER_SITE_OTHER): Payer: Medicare Other | Admitting: *Deleted

## 2017-05-24 DIAGNOSIS — I442 Atrioventricular block, complete: Secondary | ICD-10-CM

## 2017-05-25 ENCOUNTER — Encounter: Payer: Self-pay | Admitting: Internal Medicine

## 2017-05-27 LAB — CUP PACEART REMOTE DEVICE CHECK
Battery Remaining Longevity: 76 mo
Brady Statistic AS VP Percent: 55 %
Brady Statistic AS VS Percent: 1 %
Implantable Lead Implant Date: 20110725
Implantable Lead Location: 753860
Implantable Lead Model: 5076
Implantable Pulse Generator Implant Date: 20110725
Lead Channel Impedance Value: 501 Ohm
Lead Channel Pacing Threshold Amplitude: 0.375 V
Lead Channel Pacing Threshold Amplitude: 0.75 V
Lead Channel Pacing Threshold Pulse Width: 0.4 ms
Lead Channel Setting Pacing Amplitude: 2 V
Lead Channel Setting Sensing Sensitivity: 2 mV
MDC IDC LEAD IMPLANT DT: 20110725
MDC IDC LEAD LOCATION: 753859
MDC IDC MSMT BATTERY IMPEDANCE: 618 Ohm
MDC IDC MSMT BATTERY VOLTAGE: 2.79 V
MDC IDC MSMT LEADCHNL RV IMPEDANCE VALUE: 686 Ohm
MDC IDC MSMT LEADCHNL RV PACING THRESHOLD PULSEWIDTH: 0.4 ms
MDC IDC SESS DTM: 20180925205645
MDC IDC SET LEADCHNL RV PACING AMPLITUDE: 2.5 V
MDC IDC SET LEADCHNL RV PACING PULSEWIDTH: 0.4 ms
MDC IDC STAT BRADY AP VP PERCENT: 45 %
MDC IDC STAT BRADY AP VS PERCENT: 0 %

## 2017-05-27 NOTE — Progress Notes (Signed)
Remote pacemaker transmission.   

## 2017-05-31 ENCOUNTER — Encounter: Payer: Self-pay | Admitting: Cardiology

## 2017-06-05 ENCOUNTER — Other Ambulatory Visit: Payer: Self-pay | Admitting: Cardiology

## 2017-06-14 ENCOUNTER — Ambulatory Visit (HOSPITAL_COMMUNITY): Payer: Medicare Other | Attending: Cardiovascular Disease

## 2017-06-14 ENCOUNTER — Other Ambulatory Visit: Payer: Self-pay

## 2017-06-14 DIAGNOSIS — I10 Essential (primary) hypertension: Secondary | ICD-10-CM | POA: Diagnosis not present

## 2017-06-14 DIAGNOSIS — I350 Nonrheumatic aortic (valve) stenosis: Secondary | ICD-10-CM | POA: Diagnosis not present

## 2017-06-14 DIAGNOSIS — I083 Combined rheumatic disorders of mitral, aortic and tricuspid valves: Secondary | ICD-10-CM | POA: Insufficient documentation

## 2017-06-14 DIAGNOSIS — I4891 Unspecified atrial fibrillation: Secondary | ICD-10-CM | POA: Diagnosis not present

## 2017-06-14 DIAGNOSIS — Z950 Presence of cardiac pacemaker: Secondary | ICD-10-CM | POA: Insufficient documentation

## 2017-06-14 LAB — ECHOCARDIOGRAM COMPLETE
AV Area mean vel: 0.68 cm2
AV pk vel: 388 cm/s
AV vel: 0.85
AVAREAMEANVIN: 0.33 cm2/m2
AVAREAVTI: 0.83 cm2
AVAREAVTIIND: 0.41 cm2/m2
AVCELMEANRAT: 0.22
AVG: 41 mmHg
AVPG: 60 mmHg
Ao pk vel: 0.26 m/s
Ao-asc: 36 cm
CHL CUP AV PEAK INDEX: 0.4
CHL CUP MV DEC (S): 158
CHL CUP REG VEL DIAS: 166 cm/s
CHL CUP TV REG PEAK VELOCITY: 272 cm/s
DOP CAL AO MEAN VELOCITY: 308 cm/s
EERAT: 13.6
EWDT: 158 ms
FS: 32 % (ref 28–44)
IVS/LV PW RATIO, ED: 1.34
LA ID, A-P, ES: 45 mm
LA diam index: 2.2 cm/m2
LA vol A4C: 76.4 ml
LA vol index: 43.5 mL/m2
LA vol: 89.2 mL
LDCA: 3.14 cm2
LEFT ATRIUM END SYS DIAM: 45 mm
LV E/e' medial: 13.6
LV PW d: 9.28 mm — AB (ref 0.6–1.1)
LV TDI E'LATERAL: 7.72
LV TDI E'MEDIAL: 6.09
LV e' LATERAL: 7.72 cm/s
LVEEAVG: 13.6
LVOT VTI: 27.6 cm
LVOT peak vel: 102 cm/s
LVOTD: 20 mm
LVOTSV: 87 mL
LVOTVTI: 0.27 cm
MV Peak grad: 4 mmHg
MVPKAVEL: 135 m/s
MVPKEVEL: 105 m/s
P 1/2 time: 316 ms
PV Reg grad dias: 11 mmHg
RV LATERAL S' VELOCITY: 8.81 cm/s
RV sys press: 38 mmHg
TAPSE: 18.5 mm
TR max vel: 272 cm/s
VTI: 102 cm
Valve area index: 0.41
Valve area: 0.85 cm2

## 2017-06-21 DIAGNOSIS — I480 Paroxysmal atrial fibrillation: Secondary | ICD-10-CM | POA: Diagnosis not present

## 2017-06-21 DIAGNOSIS — Z0000 Encounter for general adult medical examination without abnormal findings: Secondary | ICD-10-CM | POA: Diagnosis not present

## 2017-06-21 DIAGNOSIS — Z79899 Other long term (current) drug therapy: Secondary | ICD-10-CM | POA: Diagnosis not present

## 2017-06-21 DIAGNOSIS — Q253 Supravalvular aortic stenosis: Secondary | ICD-10-CM | POA: Diagnosis not present

## 2017-06-21 DIAGNOSIS — F325 Major depressive disorder, single episode, in full remission: Secondary | ICD-10-CM | POA: Diagnosis not present

## 2017-06-21 DIAGNOSIS — I10 Essential (primary) hypertension: Secondary | ICD-10-CM | POA: Diagnosis not present

## 2017-06-21 DIAGNOSIS — E7800 Pure hypercholesterolemia, unspecified: Secondary | ICD-10-CM | POA: Diagnosis not present

## 2017-06-21 DIAGNOSIS — Z1389 Encounter for screening for other disorder: Secondary | ICD-10-CM | POA: Diagnosis not present

## 2017-06-29 DIAGNOSIS — H52203 Unspecified astigmatism, bilateral: Secondary | ICD-10-CM | POA: Diagnosis not present

## 2017-06-29 DIAGNOSIS — H401131 Primary open-angle glaucoma, bilateral, mild stage: Secondary | ICD-10-CM | POA: Diagnosis not present

## 2017-06-29 DIAGNOSIS — Z961 Presence of intraocular lens: Secondary | ICD-10-CM | POA: Diagnosis not present

## 2017-06-29 DIAGNOSIS — H5340 Unspecified visual field defects: Secondary | ICD-10-CM | POA: Diagnosis not present

## 2017-07-05 DIAGNOSIS — Z23 Encounter for immunization: Secondary | ICD-10-CM | POA: Diagnosis not present

## 2017-08-26 ENCOUNTER — Telehealth: Payer: Self-pay | Admitting: Cardiology

## 2017-08-26 ENCOUNTER — Ambulatory Visit (INDEPENDENT_AMBULATORY_CARE_PROVIDER_SITE_OTHER): Payer: Medicare Other | Admitting: *Deleted

## 2017-08-26 DIAGNOSIS — I442 Atrioventricular block, complete: Secondary | ICD-10-CM | POA: Diagnosis not present

## 2017-08-26 NOTE — Telephone Encounter (Signed)
Spoke with pt and reminded pt of remote transmission that is due today. Pt verbalized understanding.   

## 2017-08-29 ENCOUNTER — Encounter: Payer: Self-pay | Admitting: Cardiology

## 2017-08-29 NOTE — Progress Notes (Signed)
Remote pacemaker transmission.   

## 2017-09-14 LAB — CUP PACEART REMOTE DEVICE CHECK
Battery Voltage: 2.79 V
Brady Statistic AP VP Percent: 47 %
Brady Statistic AP VS Percent: 0 %
Brady Statistic AS VP Percent: 52 %
Implantable Lead Location: 753860
Implantable Lead Model: 5076
Implantable Lead Model: 5076
Implantable Pulse Generator Implant Date: 20110725
Lead Channel Impedance Value: 494 Ohm
Lead Channel Pacing Threshold Amplitude: 0.375 V
Lead Channel Pacing Threshold Pulse Width: 0.4 ms
Lead Channel Pacing Threshold Pulse Width: 0.4 ms
Lead Channel Setting Pacing Pulse Width: 0.4 ms
MDC IDC LEAD IMPLANT DT: 20110725
MDC IDC LEAD IMPLANT DT: 20110725
MDC IDC LEAD LOCATION: 753859
MDC IDC MSMT BATTERY IMPEDANCE: 668 Ohm
MDC IDC MSMT BATTERY REMAINING LONGEVITY: 71 mo
MDC IDC MSMT LEADCHNL RV IMPEDANCE VALUE: 639 Ohm
MDC IDC MSMT LEADCHNL RV PACING THRESHOLD AMPLITUDE: 0.625 V
MDC IDC SESS DTM: 20181228212409
MDC IDC SET LEADCHNL RA PACING AMPLITUDE: 2 V
MDC IDC SET LEADCHNL RV PACING AMPLITUDE: 2.5 V
MDC IDC SET LEADCHNL RV SENSING SENSITIVITY: 5.6 mV
MDC IDC STAT BRADY AS VS PERCENT: 1 %

## 2017-10-25 ENCOUNTER — Encounter: Payer: Self-pay | Admitting: Cardiology

## 2017-10-25 ENCOUNTER — Ambulatory Visit (INDEPENDENT_AMBULATORY_CARE_PROVIDER_SITE_OTHER): Payer: Medicare Other | Admitting: Cardiology

## 2017-10-25 VITALS — BP 156/88 | HR 81 | Ht 71.0 in | Wt 193.2 lb

## 2017-10-25 DIAGNOSIS — Z950 Presence of cardiac pacemaker: Secondary | ICD-10-CM

## 2017-10-25 DIAGNOSIS — I350 Nonrheumatic aortic (valve) stenosis: Secondary | ICD-10-CM | POA: Diagnosis not present

## 2017-10-25 DIAGNOSIS — I10 Essential (primary) hypertension: Secondary | ICD-10-CM | POA: Diagnosis not present

## 2017-10-25 DIAGNOSIS — I480 Paroxysmal atrial fibrillation: Secondary | ICD-10-CM

## 2017-10-25 NOTE — Patient Instructions (Signed)
Medication Instructions:  The current medical regimen is effective;  continue present plan and medications.  Testing/Procedures: Your physician has requested that you have an echocardiogram (due after 12/13/17). Echocardiography is a painless test that uses sound waves to create images of your heart. It provides your doctor with information about the size and shape of your heart and how well your heart's chambers and valves are working. This procedure takes approximately one hour. There are no restrictions for this procedure.  Follow-Up: Follow up in 6 months with Dr. Marlou Porch.  You will receive a letter in the mail 2 months before you are due.  Please call us when you receive this letter to schedule your follow up appointment.  If you need a refill on your cardiac medications before your next appointment, please call your pharmacy.  Thank you for choosing [facility]!!

## 2017-10-25 NOTE — Progress Notes (Signed)
[facility]. [address], [address] [place], [place]  [address] Phone: [phone] Fax:  [phone]  Date:  10/25/2017   ID:  Dalton Townsend, DOB Jul 21, 1923, MRN [id number]  PCP:  Lajean Manes, MD   History of Present Illness: Dalton Townsend is a 84 y.o. male with coronary artery disease status post CABG, severe aortic stenosis, pacemaker insertion on 03/23/10, subsequent battery change out and RV lead failure here for followup. Has underlying AV block. He had a brief automatic mode switching less than 30 seconds. overall, doing well. Dr.Taylor  On 10/06/16 we had lengthy discussion about TAVR  Three-vessel coronary artery disease status post bypass in 1999-no evidence of angina.   Prior history of postoperative atrial fibrillation-paroxysmal. No further occurrence. Not on anticoagulation and would like to contemplate this;does not want to start treatment . Recent pacemaker check normal.   Has ulcerative colitis, severe.  Followed by Dr. Cristina Gong.  Has aortic murmur, previously followed with echocardiography-no syncope, no angina, dyspnea. His hypertension has been an issue in the past. He reports 130s/90s at home.    He goes to gym almost daily - fitness training, aerobic exercises.  Had a leg skin graft on 05/04/16 for non healing wound.   10/25/17 -still states that he is not having any change in shortness of breath, no shortness of breath.  We are, bicycle and Y.  He is somewhat resistant about going to see the valve clinic.  He does however state that he will let me know immediately if he begins to feel any symptoms.  No chest pain, no syncope, no bleeding.  Wt Readings from Last 3 Encounters:  10/25/17 193 lb 3.2 oz (87.6 kg)  04/26/17 187 lb 3.2 oz (84.9 kg)  03/19/17 170 lb (77.1 kg)     Past Medical History:  Diagnosis Date  . Anemia    mild, hemoglobin 11.5  . Anxiety state, unspecified   . Aortic valve disorders   . Aortic valve stenosis   . Arthritis   .  Atrioventricular block, complete ([facility])   . Cardiac pacemaker in situ    3rd heart block  . CMV (cytomegalovirus infection) ([facility]) 04/2010  . Colitis   . Coronary atherosclerosis of native coronary artery   . Dyslipidemia   . Edema   . Embolic stroke ([facility])    right hemispheric embolic event  . Essential hypertension   . Functional diarrhea   . Heart murmur   . Hypercholesteremia   . Intestinal infection due to Clostridium difficile 03/2010  . Left sided ulcerative (chronic) colitis ([facility])   . Mechanical complication due to cardiac pacemaker (electrode)   . Nonspecific abnormal finding in stool contents   . Occlusion and stenosis of carotid artery with cerebral infarction   . PAF (paroxysmal atrial fibrillation) (H[facility])    Hx of post op PAF  . Positive for microalbuminuria    On Benicar  . Postsurgical aortocoronary bypass status    3 vessel CAD, s/p CABG 04/1998  . Presence of permanent cardiac pacemaker   . PVD (peripheral vascular disease) ([facility])   . Seasonal allergies   . Sick sinus syndrome ([facility])   . Sigmoid diverticulosis   . TIA (transient ischemic attack)   . Ulcerative (chronic) enterocolitis ([facility])   . 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 Ulcerative colitis     Past Surgical History:  Procedure Laterality Date  . CARDIAC CATHETERIZATION  1999  .  CAROTID ENDARTERECTOMY Right 2004  . CATARACT EXTRACTION, BILATERAL  2011  . CORONARY ARTERY BYPASS GRAFT  04/1998   3 vessel CAD  . INCISION AND DRAINAGE OF WOUND Left 05/04/2016   Procedure: SURGICAL PREP FOR GRAFTING LEFT LEG WITH THERA SKIN APPLICATION;  Surgeon: Irene Limbo, MD;  Location: [facility];  Service: Plastics;  Laterality: Left;  . INTRAOPERATIVE ARTERIOGRAM  06/2000  . PACEMAKER INSERTION  03/25/2010  . TOTAL KNEE ARTHROPLASTY  07/16/09  . TRANSURETHRAL RESECTION OF PROSTATE  1998    Current Outpatient Medications  Medication Sig Dispense Refill  .  amLODipine (NORVASC) 5 MG tablet Take 1 tablet (5 mg total) by mouth daily. 90 tablet 2  . atorvastatin (LIPITOR) 20 MG tablet Take 20 mg by mouth daily.    . balsalazide (COLAZAL) 750 MG capsule Take 2,250 mg by mouth 3 (three) times daily.    [name] ELIQUIS 5 MG TABS tablet TAKE 1 TABLET TWICE A DAY 180 tablet 1  . escitalopram (LEXAPRO) 10 MG tablet Take 10 mg by mouth daily.      . finasteride (PROSCAR) 5 MG tablet Take 5 mg by mouth daily.     . furosemide (LASIX) 40 MG tablet Take 40 mg by mouth 2 (two) times daily.    [name] latanoprost (XALATAN) 0.005 % ophthalmic solution Place 1 drop into both eyes at bedtime.     [name] losartan (COZAAR) 100 MG tablet Take 100 mg by mouth daily.     . metoprolol succinate (TOPROL-XL) 50 MG 24 hr tablet Take 50 mg by mouth daily. Take with or immediately following a meal.    . Multiple Vitamin (MULTIVITAMIN) tablet Take 1 tablet by mouth daily.      . potassium chloride (KLOR-CON) 10 MEQ CR tablet Take 10 mEq by mouth 2 (two) times daily.       No current facility-administered medications for this visit.     Allergies:    Allergies  Allergen Reactions  . No Known Allergies     Social History:  The patient  reports that he quit smoking about 65 years ago. His smoking use included cigarettes. He quit after 10.00 years of use. He has quit using smokeless tobacco. He reports that he does not drink alcohol or use drugs.   Family History  Problem Relation Age of Onset  . Congestive Heart Failure Mother   . Leukemia Father   . Leukemia Unknown   . Heart failure Unknown   . Cancer Unknown     ROS:  Please see the history of present illness.  No rectal bleeding noted. PHYSICAL EXAM: VS:  BP (!) 156/88   Pulse 81   Ht 5' 11"  (1.803 m)   Wt 193 lb 3.2 oz (87.6 kg)   SpO2 98%   BMI 26.95 kg/m  GEN: Well nourished, well developed, in no acute distress  HEENT: normal  Neck: no JVD, carotid bruits, or masses Cardiac: RRR; 3/6 SEM, no rubs, or gallops,no  edema  Respiratory:  clear to auscultation bilaterally, normal work of breathing GI: soft, nontender, nondistended, + BS MS: no deformity or atrophy  Skin: warm and dry, no rash Neuro:  Alert and Oriented x 3, Strength and sensation are intact Psych: euthymic mood, full affect    EKG:  01/04/14 to Ventricular paced rhythm Labs: Creatinine 0.88 - Hg 14.4 11/14  ECHO: 05/22/14:  - Left ventricle: The cavity size was normal. There was mild focal basal hypertrophy of the septum. Systolic function was  normal. The estimated ejection fraction was in the range of 60% to 65%. Wall motion was normal; there were no regional wall motion abnormalities. Doppler parameters are consistent with abnormal left ventricular relaxation (grade 1 diastolic dysfunction). - Aortic valve: There was moderate stenosis. There was mild regurgitation. Peak velocity (S): 348 cm/s. Mean gradient (S): 32 mm Hg. Peak gradient (S): 48 mm Hg. - Mitral valve: Calcified annulus. - Right ventricle: The cavity size was mildly dilated. Wall thickness was normal.  Echocardiogram 08/10/16  - Left ventricle: The cavity size was normal. Wall thickness was   increased in a pattern of mild LVH. Systolic function was normal.   The estimated ejection fraction was in the range of 55% to 60%.   Wall motion was normal; there were no regional wall motion   abnormalities. Doppler parameters are consistent with abnormal   left ventricular relaxation (grade 1 diastolic dysfunction).   Doppler parameters are consistent with high ventricular filling   pressure. - Ventricular septum: Septal motion showed dyssynergy. These   changes are consistent with right ventricular pacing. - Aortic valve: Valve mobility was restricted. There was severe   stenosis. There was mild regurgitation. Mean gradient (S): 47 mm   Hg. Peak gradient (S): 81 mm Hg. - Mitral valve: Calcified annulus. - Left atrium: The atrium was mildly  dilated. - Right ventricle: The cavity size was mildly dilated. - Right atrium: The atrium was mildly dilated.  Impressions:  - Normal LV systolic function; grade 1 diastolic dysfunction;   calcified aortic valve with fusion of noncoronary and left cusps;   severe AS with mean gradient 47 mmHg; mild AI; mild LAE/RAE/RVE.  06/14/17 ECHO: - Left ventricle: The cavity size was normal. Wall thickness was   increased in a pattern of mild LVH. There was focal basal   hypertrophy. Systolic function was normal. The estimated ejection   fraction was in the range of 55% to 60%. Doppler parameters are   consistent with abnormal left ventricular relaxation (grade 1   diastolic dysfunction). - Aortic valve: There was severe stenosis. There was mild   regurgitation. Mean gradient (S): 41 mm Hg. Peak gradient (S): 60   mm Hg. - Mitral valve: Mildly to moderately calcified annulus. There was   mild regurgitation. - Left atrium: The atrium was moderately dilated. - Right ventricle: The cavity size was moderately dilated. - Right atrium: The atrium was mildly dilated. - Tricuspid valve: There was moderate regurgitation. - Pulmonary arteries: Systolic pressure was moderately increased.   PA peak pressure: 38 mm Hg (S).  ASSESSMENT AND PLAN:   Severe aortic stenosis- we had lengthy discussion today about potential TAVR again . He admits that he is not having any symptoms currently. He is still going to the [facility], exercising on a recumbent bicycle. No shortness of breath, no syncope, no chest pain. We will closely follow him. His daughter Peg, pharmacist at [facility] was present previously for discussion. Will repeat echo (23month.  I once again offered him a consultation with our valve clinic but he declined at this time.  Paroxysmal atrial fibrillation-Eliquis.  His daughter is a pSoftware engineerhere at [employer]. He has ulcerative colitis and in the past has had bleeding from the anus. He is concerned about this.  Hs not had any further bleeding.  Has been doing quite well, stable.  Lab work every 6 months.  CAD - post CABG, because of this, think for secondary prevention we should probably continue his atorvastatin.  Difficulty  healing left leg superficial wound.-Healed. Prior plastics, skin grafting, wound care. Doing better. Offered him consultative visit with [facility] specialist, he declined.   Pacemaker-functioning well.  No changes.  Hypertension-degree of whitecoat hypertension. Normally reasonably controlled at home.  Currently stable.  6 month followup.  Signed, Candee Furbish, MD [facility]  10/25/2017 11:20 AM

## 2017-11-16 DIAGNOSIS — Z85828 Personal history of other malignant neoplasm of skin: Secondary | ICD-10-CM | POA: Diagnosis not present

## 2017-11-16 DIAGNOSIS — D485 Neoplasm of uncertain behavior of skin: Secondary | ICD-10-CM | POA: Diagnosis not present

## 2017-11-16 DIAGNOSIS — L570 Actinic keratosis: Secondary | ICD-10-CM | POA: Diagnosis not present

## 2017-11-16 DIAGNOSIS — L739 Follicular disorder, unspecified: Secondary | ICD-10-CM | POA: Diagnosis not present

## 2017-11-16 DIAGNOSIS — L821 Other seborrheic keratosis: Secondary | ICD-10-CM | POA: Diagnosis not present

## 2017-11-16 DIAGNOSIS — D1801 Hemangioma of skin and subcutaneous tissue: Secondary | ICD-10-CM | POA: Diagnosis not present

## 2017-11-16 DIAGNOSIS — D225 Melanocytic nevi of trunk: Secondary | ICD-10-CM | POA: Diagnosis not present

## 2017-11-16 DIAGNOSIS — D2272 Melanocytic nevi of left lower limb, including hip: Secondary | ICD-10-CM | POA: Diagnosis not present

## 2017-11-16 DIAGNOSIS — D692 Other nonthrombocytopenic purpura: Secondary | ICD-10-CM | POA: Diagnosis not present

## 2017-11-22 DIAGNOSIS — N3943 Post-void dribbling: Secondary | ICD-10-CM | POA: Diagnosis not present

## 2017-11-22 DIAGNOSIS — N401 Enlarged prostate with lower urinary tract symptoms: Secondary | ICD-10-CM | POA: Diagnosis not present

## 2017-11-22 DIAGNOSIS — R3121 Asymptomatic microscopic hematuria: Secondary | ICD-10-CM | POA: Diagnosis not present

## 2017-11-28 ENCOUNTER — Other Ambulatory Visit: Payer: Self-pay | Admitting: Cardiology

## 2017-11-29 ENCOUNTER — Ambulatory Visit (INDEPENDENT_AMBULATORY_CARE_PROVIDER_SITE_OTHER): Payer: Medicare Other | Admitting: *Deleted

## 2017-11-29 ENCOUNTER — Telehealth: Payer: Self-pay | Admitting: Cardiology

## 2017-11-29 DIAGNOSIS — I442 Atrioventricular block, complete: Secondary | ICD-10-CM | POA: Diagnosis not present

## 2017-11-29 NOTE — Telephone Encounter (Signed)
Spoke with pt and reminded pt of remote transmission that is due today. Pt verbalized understanding.   

## 2017-11-29 NOTE — Telephone Encounter (Signed)
Pt is a 84 yr old male. Wt 87.6Kg on 10/25/17 when he saw Dr Marlou Porch. SCr was  0.89 from hosp on 03/19/17. Will refill Eliquis 77m BID.

## 2017-11-30 ENCOUNTER — Encounter: Payer: Self-pay | Admitting: Cardiology

## 2017-11-30 NOTE — Progress Notes (Signed)
Remote pacemaker transmission.   

## 2017-12-06 LAB — CUP PACEART REMOTE DEVICE CHECK
Battery Remaining Longevity: 62 mo
Battery Voltage: 2.78 V
Brady Statistic AP VS Percent: 0 %
Date Time Interrogation Session: 20190402200441
Implantable Lead Implant Date: 20110725
Implantable Lead Implant Date: 20110725
Implantable Lead Location: 753859
Implantable Lead Location: 753860
Implantable Pulse Generator Implant Date: 20110725
Lead Channel Impedance Value: 610 Ohm
Lead Channel Pacing Threshold Amplitude: 0.375 V
Lead Channel Pacing Threshold Pulse Width: 0.4 ms
Lead Channel Setting Pacing Amplitude: 2 V
Lead Channel Setting Pacing Pulse Width: 0.4 ms
MDC IDC MSMT BATTERY IMPEDANCE: 845 Ohm
MDC IDC MSMT LEADCHNL RA IMPEDANCE VALUE: 487 Ohm
MDC IDC MSMT LEADCHNL RA PACING THRESHOLD PULSEWIDTH: 0.4 ms
MDC IDC MSMT LEADCHNL RV PACING THRESHOLD AMPLITUDE: 0.75 V
MDC IDC SET LEADCHNL RV PACING AMPLITUDE: 2.5 V
MDC IDC SET LEADCHNL RV SENSING SENSITIVITY: 5.6 mV
MDC IDC STAT BRADY AP VP PERCENT: 49 %
MDC IDC STAT BRADY AS VP PERCENT: 50 %
MDC IDC STAT BRADY AS VS PERCENT: 1 %

## 2017-12-20 ENCOUNTER — Other Ambulatory Visit: Payer: Self-pay

## 2017-12-20 ENCOUNTER — Ambulatory Visit (HOSPITAL_COMMUNITY): Payer: Medicare Other | Attending: Cardiovascular Disease

## 2017-12-20 DIAGNOSIS — E785 Hyperlipidemia, unspecified: Secondary | ICD-10-CM | POA: Insufficient documentation

## 2017-12-20 DIAGNOSIS — I350 Nonrheumatic aortic (valve) stenosis: Secondary | ICD-10-CM | POA: Diagnosis not present

## 2017-12-20 DIAGNOSIS — Z951 Presence of aortocoronary bypass graft: Secondary | ICD-10-CM | POA: Diagnosis not present

## 2017-12-20 DIAGNOSIS — I083 Combined rheumatic disorders of mitral, aortic and tricuspid valves: Secondary | ICD-10-CM | POA: Insufficient documentation

## 2017-12-20 DIAGNOSIS — I4891 Unspecified atrial fibrillation: Secondary | ICD-10-CM | POA: Diagnosis not present

## 2017-12-20 DIAGNOSIS — Z8673 Personal history of transient ischemic attack (TIA), and cerebral infarction without residual deficits: Secondary | ICD-10-CM | POA: Diagnosis not present

## 2017-12-21 ENCOUNTER — Telehealth: Payer: Self-pay | Admitting: Cardiology

## 2017-12-21 NOTE — Telephone Encounter (Signed)
New message   Please call with echo results

## 2017-12-21 NOTE — Telephone Encounter (Signed)
I spoke with pt and reviewed echo results with him.  

## 2018-01-10 DIAGNOSIS — H401131 Primary open-angle glaucoma, bilateral, mild stage: Secondary | ICD-10-CM | POA: Diagnosis not present

## 2018-01-10 DIAGNOSIS — H5340 Unspecified visual field defects: Secondary | ICD-10-CM | POA: Diagnosis not present

## 2018-02-21 DIAGNOSIS — K51219 Ulcerative (chronic) proctitis with unspecified complications: Secondary | ICD-10-CM | POA: Diagnosis not present

## 2018-02-28 ENCOUNTER — Ambulatory Visit (INDEPENDENT_AMBULATORY_CARE_PROVIDER_SITE_OTHER): Payer: Medicare Other | Admitting: *Deleted

## 2018-02-28 ENCOUNTER — Telehealth: Payer: Self-pay | Admitting: Cardiology

## 2018-02-28 DIAGNOSIS — I442 Atrioventricular block, complete: Secondary | ICD-10-CM | POA: Diagnosis not present

## 2018-02-28 NOTE — Telephone Encounter (Signed)
Spoke with pt and reminded pt of remote transmission that is due today. Pt verbalized understanding.   

## 2018-03-01 ENCOUNTER — Encounter: Payer: Self-pay | Admitting: Cardiology

## 2018-03-01 NOTE — Progress Notes (Signed)
Remote pacemaker transmission.   

## 2018-03-15 ENCOUNTER — Encounter: Payer: Self-pay | Admitting: Internal Medicine

## 2018-03-15 ENCOUNTER — Ambulatory Visit (INDEPENDENT_AMBULATORY_CARE_PROVIDER_SITE_OTHER): Payer: Medicare Other | Admitting: Internal Medicine

## 2018-03-15 VITALS — BP 142/78 | HR 80 | Ht 71.0 in | Wt 193.0 lb

## 2018-03-15 DIAGNOSIS — I442 Atrioventricular block, complete: Secondary | ICD-10-CM | POA: Diagnosis not present

## 2018-03-15 DIAGNOSIS — Z950 Presence of cardiac pacemaker: Secondary | ICD-10-CM

## 2018-03-15 DIAGNOSIS — I480 Paroxysmal atrial fibrillation: Secondary | ICD-10-CM | POA: Diagnosis not present

## 2018-03-15 NOTE — Addendum Note (Signed)
Addended by: Marlis Edelson C on: 03/15/2018 04:51 PM   Modules accepted: Orders

## 2018-03-15 NOTE — Patient Instructions (Signed)
Medication Instructions:  Your physician recommends that you continue on your current medications as directed. Please refer to the Current Medication list given to you today.  Labwork: None ordered.  Testing/Procedures: None ordered.  Follow-Up: Your physician wants you to follow-up in: one year with Dr. Lovena Le.   You will receive a reminder letter in the mail two months in advance. If you don't receive a letter, please call our office to schedule the follow-up appointment.  Remote monitoring is used to monitor your Pacemaker from home. This monitoring reduces the number of office visits required to check your device to one time per year. It allows [place] to keep an eye on the functioning of your device to ensure it is working properly. You are scheduled for a device check from home on 05/30/2018. You may send your transmission at any time that day. If you have a wireless device, the transmission will be sent automatically. After your physician reviews your transmission, you will receive a postcard with your next transmission date.  Any Other Special Instructions Will Be Listed Below (If Applicable).  If you need a refill on your cardiac medications before your next appointment, please call your pharmacy.

## 2018-03-15 NOTE — Progress Notes (Signed)
HPI [name] returns today for ongoing evaluation and management of his PPM. 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 He has HTN and dyslipidemia. He has coronary artery disease and a remote TIA. Dalton KitchenHe has PAF which has been demonstrated on his PPM. He has been reluctant to take any anti-coagulation other than ASA due to his UC. However, I encouraged him to consider Eliquis and he has done so and appears to be tolerating the medication. He has developed severe AS with a mean gradient of 40 but denies any symptoms. He has chronic edema but denies chest pain, sob, or syncope.  Allergies  Allergen Reactions  . No Known Allergies      Current Outpatient Medications  Medication Sig Dispense Refill  . amLODipine (NORVASC) 5 MG tablet Take 1 tablet (5 mg total) by mouth daily. 90 tablet 2  . atorvastatin (LIPITOR) 20 MG tablet Take 20 mg by mouth daily.    . balsalazide (COLAZAL) 750 MG capsule Take 2,250 mg by mouth 3 (three) times daily.    Dalton Townsend ELIQUIS 5 MG TABS tablet TAKE 1 TABLET TWICE A DAY 180 tablet 1  . escitalopram (LEXAPRO) 10 MG tablet Take 10 mg by mouth daily.      . finasteride (PROSCAR) 5 MG tablet Take 5 mg by mouth daily.     . furosemide (LASIX) 40 MG tablet Take 40 mg by mouth 2 (two) times daily.    Dalton Townsend latanoprost (XALATAN) 0.005 % ophthalmic solution Place 1 drop into both eyes at bedtime.     Dalton Townsend losartan (COZAAR) 100 MG tablet Take 100 mg by mouth daily.     . metoprolol succinate (TOPROL-XL) 50 MG 24 hr tablet Take 50 mg by mouth daily. Take with or immediately following a meal.    . Multiple Vitamin (MULTIVITAMIN) tablet Take 1 tablet by mouth daily.      . potassium chloride (KLOR-CON) 10 MEQ CR tablet Take 10 mEq by mouth 2 (two) times daily.       No current facility-administered medications for this visit.      Past Medical History:  Diagnosis Date  . Anemia    mild, hemoglobin 11.5  . Anxiety state,  unspecified   . Aortic valve disorders   . Aortic valve stenosis   . Arthritis   . Atrioventricular block, complete ([place] [place])   . Cardiac pacemaker in situ    3rd heart block  . CMV (cytomegalovirus infection) ([facility]) 04/2010  . Colitis   . Coronary atherosclerosis of native coronary artery   . Dyslipidemia   . Edema   . Embolic stroke ([place])    right hemispheric embolic event  . Essential hypertension   . Functional diarrhea   . Heart murmur   . Hypercholesteremia   . Intestinal infection due to Clostridium difficile 03/2010  . Left sided ulcerative (chronic) colitis ([facility])   . Mechanical complication due to cardiac pacemaker (electrode)   . Nonspecific abnormal finding in stool contents   . Occlusion and stenosis of carotid artery with cerebral infarction   . PAF (paroxysmal atrial fibrillation) ([facility])    Hx of post op PAF  . Positive for microalbuminuria    On Benicar  . Postsurgical aortocoronary bypass status    3 vessel CAD, s/p CABG 04/1998  . Presence of permanent cardiac pacemaker   . PVD (peripheral vascular disease) ([facility])   . Seasonal allergies   .  Sick sinus syndrome ([facility])   . Sigmoid diverticulosis   . TIA (transient ischemic attack)   . Ulcerative (chronic) enterocolitis ([facility])   . 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 Ulcerative colitis     ROS:   All systems reviewed and negative except as noted in the HPI.   Past Surgical History:  Procedure Laterality Date  . CARDIAC CATHETERIZATION  1999  . CAROTID ENDARTERECTOMY Right 2004  . CATARACT EXTRACTION, BILATERAL  2011  . CORONARY ARTERY BYPASS GRAFT  04/1998   3 vessel CAD  . INCISION AND DRAINAGE OF WOUND Left 05/04/2016   Procedure: SURGICAL PREP FOR GRAFTING LEFT LEG WITH THERA SKIN APPLICATION;  Surgeon: Irene Limbo, MD;  Location: [facility];  Service: Plastics;  Laterality: Left;  . INTRAOPERATIVE ARTERIOGRAM  06/2000  . PACEMAKER INSERTION  03/25/2010  .  TOTAL KNEE ARTHROPLASTY  07/16/09  . TRANSURETHRAL RESECTION OF PROSTATE  1998     Family History  Problem Relation Age of Onset  . Congestive Heart Failure Mother   . Leukemia Father   . Leukemia Unknown   . Heart failure Unknown   . Cancer Unknown      Social History   Socioeconomic History  . Marital status: Married    Spouse name: Not on file  . Number of children: 3  . Years of education: Not on file  . Highest education level: Not on file  Occupational History  . Occupation: retired  Scientific laboratory technician  . Financial resource strain: Not on file  . Food insecurity:    Worry: Not on file    Inability: Not on file  . Transportation needs:    Medical: Not on file    Non-medical: Not on file  Tobacco Use  . Smoking status: Former Smoker    Years: 10.00    Types: Cigarettes    Last attempt to quit: 08/30/1952    Years since quitting: 65.5  . Smokeless tobacco: Former Network engineer and Sexual Activity  . Alcohol use: No  . Drug use: No  . Sexual activity: Not on file  Lifestyle  . Physical activity:    Days per week: Not on file    Minutes per session: Not on file  . Stress: Not on file  Relationships  . Social connections:    Talks on phone: Not on file    Gets together: Not on file    Attends religious service: Not on file    Active member of club or organization: Not on file    Attends meetings of clubs or organizations: Not on file    Relationship status: Not on file  . Intimate partner violence:    Fear of current or ex partner: Not on file    Emotionally abused: Not on file    Physically abused: Not on file    Forced sexual activity: Not on file  Other Topics Concern  . Not on file  Social History Narrative  . Not on file     BP (!) 142/78   Pulse 80   Ht 5' 11"  (1.803 m)   Wt 193 lb (87.5 kg)   SpO2 96%   BMI 26.92 kg/m   Physical Exam:  Well appearing NAD HEENT: Unremarkable Neck:  No JVD, no thyromegally Lymphatics:  No  adenopathy Back:  No CVA tenderness Lungs:  Clear with no wheezes HEART:  Regular rate rhythm, 2/6 systolic murmurs, no rubs, no clicks Abd:  soft, positive bowel sounds, no organomegally, no rebound, no guarding Ext:  2 plus pulses, no edema, no cyanosis, no clubbing Skin:  No rashes no nodules Neuro:  CN II through XII intact, motor grossly intact  EKG - AV paced  DEVICE  Normal device function.  See PaceArt for details.   Assess/Plan: 1. CHB - he is s/p PPM insertion and is asymptomatic. 2. PPM - his Medtronic device is working normally. Will recheck in several months. 3. AS - he is minimally if at all symptomatic despite tight AS. He will follow up with Dr. Marlou Porch in 2 months.   [name].

## 2018-03-16 LAB — CUP PACEART INCLINIC DEVICE CHECK
Battery Impedance: 845 Ohm
Battery Remaining Longevity: 65 mo
Brady Statistic AP VP Percent: 51 %
Brady Statistic AS VS Percent: 2 %
Date Time Interrogation Session: 20190717150921
Implantable Lead Implant Date: 20110725
Implantable Lead Location: 753860
Implantable Lead Model: 5076
Lead Channel Pacing Threshold Amplitude: 0.5 V
Lead Channel Pacing Threshold Pulse Width: 0.4 ms
Lead Channel Sensing Intrinsic Amplitude: 8 mV
Lead Channel Setting Pacing Amplitude: 2 V
Lead Channel Setting Pacing Amplitude: 2.5 V
Lead Channel Setting Pacing Pulse Width: 0.4 ms
MDC IDC LEAD IMPLANT DT: 20110725
MDC IDC LEAD LOCATION: 753859
MDC IDC MSMT BATTERY VOLTAGE: 2.78 V
MDC IDC MSMT LEADCHNL RA IMPEDANCE VALUE: 508 Ohm
MDC IDC MSMT LEADCHNL RA PACING THRESHOLD PULSEWIDTH: 0.4 ms
MDC IDC MSMT LEADCHNL RA SENSING INTR AMPL: 1 mV
MDC IDC MSMT LEADCHNL RV IMPEDANCE VALUE: 675 Ohm
MDC IDC MSMT LEADCHNL RV PACING THRESHOLD AMPLITUDE: 0.75 V
MDC IDC PG IMPLANT DT: 20110725
MDC IDC SET LEADCHNL RV SENSING SENSITIVITY: 2 mV
MDC IDC STAT BRADY AP VS PERCENT: 0 %
MDC IDC STAT BRADY AS VP PERCENT: 47 %

## 2018-03-18 LAB — CUP PACEART REMOTE DEVICE CHECK
Battery Impedance: 844 Ohm
Battery Voltage: 2.78 V
Brady Statistic AP VP Percent: 51 %
Brady Statistic AS VP Percent: 48 %
Brady Statistic AS VS Percent: 2 %
Implantable Lead Implant Date: 20110725
Implantable Lead Model: 5076
Implantable Pulse Generator Implant Date: 20110725
Lead Channel Impedance Value: 494 Ohm
Lead Channel Impedance Value: 642 Ohm
Lead Channel Pacing Threshold Pulse Width: 0.4 ms
Lead Channel Sensing Intrinsic Amplitude: 1 mV
MDC IDC LEAD IMPLANT DT: 20110725
MDC IDC LEAD LOCATION: 753859
MDC IDC LEAD LOCATION: 753860
MDC IDC MSMT BATTERY REMAINING LONGEVITY: 64 mo
MDC IDC MSMT LEADCHNL RA PACING THRESHOLD AMPLITUDE: 0.375 V
MDC IDC MSMT LEADCHNL RV PACING THRESHOLD AMPLITUDE: 0.75 V
MDC IDC MSMT LEADCHNL RV PACING THRESHOLD PULSEWIDTH: 0.4 ms
MDC IDC SESS DTM: 20190702192233
MDC IDC SET LEADCHNL RA PACING AMPLITUDE: 2 V
MDC IDC SET LEADCHNL RV PACING AMPLITUDE: 2.5 V
MDC IDC SET LEADCHNL RV PACING PULSEWIDTH: 0.4 ms
MDC IDC SET LEADCHNL RV SENSING SENSITIVITY: 2 mV
MDC IDC STAT BRADY AP VS PERCENT: 0 %

## 2018-04-02 ENCOUNTER — Encounter (HOSPITAL_COMMUNITY): Payer: Self-pay | Admitting: Emergency Medicine

## 2018-04-02 ENCOUNTER — Inpatient Hospital Stay (HOSPITAL_COMMUNITY)
Admission: EM | Admit: 2018-04-02 | Discharge: 2018-04-07 | DRG: 286 | Disposition: A | Discharge status: Home / Self Care | Payer: Medicare Other | Attending: Internal Medicine | Admitting: Internal Medicine

## 2018-04-02 ENCOUNTER — Emergency Department (HOSPITAL_COMMUNITY): Payer: Medicare Other

## 2018-04-02 DIAGNOSIS — D61818 Other pancytopenia: Secondary | ICD-10-CM | POA: Diagnosis present

## 2018-04-02 DIAGNOSIS — M1990 Unspecified osteoarthritis, unspecified site: Secondary | ICD-10-CM | POA: Diagnosis present

## 2018-04-02 DIAGNOSIS — R0689 Other abnormalities of breathing: Secondary | ICD-10-CM | POA: Diagnosis not present

## 2018-04-02 DIAGNOSIS — R000 Tachycardia, unspecified: Secondary | ICD-10-CM | POA: Diagnosis not present

## 2018-04-02 DIAGNOSIS — J9601 Acute respiratory failure with hypoxia: Secondary | ICD-10-CM | POA: Diagnosis present

## 2018-04-02 DIAGNOSIS — Z951 Presence of aortocoronary bypass graft: Secondary | ICD-10-CM

## 2018-04-02 DIAGNOSIS — I2510 Atherosclerotic heart disease of native coronary artery without angina pectoris: Secondary | ICD-10-CM | POA: Diagnosis present

## 2018-04-02 DIAGNOSIS — E7800 Pure hypercholesterolemia, unspecified: Secondary | ICD-10-CM | POA: Diagnosis present

## 2018-04-02 DIAGNOSIS — I509 Heart failure, unspecified: Secondary | ICD-10-CM

## 2018-04-02 DIAGNOSIS — N2889 Other specified disorders of kidney and ureter: Secondary | ICD-10-CM | POA: Diagnosis not present

## 2018-04-02 DIAGNOSIS — I739 Peripheral vascular disease, unspecified: Secondary | ICD-10-CM | POA: Diagnosis present

## 2018-04-02 DIAGNOSIS — Z87891 Personal history of nicotine dependence: Secondary | ICD-10-CM

## 2018-04-02 DIAGNOSIS — E876 Hypokalemia: Secondary | ICD-10-CM | POA: Diagnosis not present

## 2018-04-02 DIAGNOSIS — F411 Generalized anxiety disorder: Secondary | ICD-10-CM | POA: Diagnosis present

## 2018-04-02 DIAGNOSIS — Z9119 Patient's noncompliance with other medical treatment and regimen: Secondary | ICD-10-CM

## 2018-04-02 DIAGNOSIS — I350 Nonrheumatic aortic (valve) stenosis: Secondary | ICD-10-CM | POA: Diagnosis not present

## 2018-04-02 DIAGNOSIS — N401 Enlarged prostate with lower urinary tract symptoms: Secondary | ICD-10-CM | POA: Diagnosis present

## 2018-04-02 DIAGNOSIS — J90 Pleural effusion, not elsewhere classified: Secondary | ICD-10-CM | POA: Diagnosis not present

## 2018-04-02 DIAGNOSIS — I10 Essential (primary) hypertension: Secondary | ICD-10-CM | POA: Diagnosis not present

## 2018-04-02 DIAGNOSIS — F329 Major depressive disorder, single episode, unspecified: Secondary | ICD-10-CM | POA: Diagnosis present

## 2018-04-02 DIAGNOSIS — R062 Wheezing: Secondary | ICD-10-CM | POA: Diagnosis not present

## 2018-04-02 DIAGNOSIS — I6523 Occlusion and stenosis of bilateral carotid arteries: Secondary | ICD-10-CM | POA: Diagnosis not present

## 2018-04-02 DIAGNOSIS — I495 Sick sinus syndrome: Secondary | ICD-10-CM | POA: Diagnosis present

## 2018-04-02 DIAGNOSIS — Z950 Presence of cardiac pacemaker: Secondary | ICD-10-CM | POA: Diagnosis not present

## 2018-04-02 DIAGNOSIS — I5031 Acute diastolic (congestive) heart failure: Secondary | ICD-10-CM | POA: Diagnosis not present

## 2018-04-02 DIAGNOSIS — Z8249 Family history of ischemic heart disease and other diseases of the circulatory system: Secondary | ICD-10-CM

## 2018-04-02 DIAGNOSIS — I4891 Unspecified atrial fibrillation: Secondary | ICD-10-CM

## 2018-04-02 DIAGNOSIS — Z7901 Long term (current) use of anticoagulants: Secondary | ICD-10-CM

## 2018-04-02 DIAGNOSIS — I340 Nonrheumatic mitral (valve) insufficiency: Secondary | ICD-10-CM | POA: Diagnosis not present

## 2018-04-02 DIAGNOSIS — I5033 Acute on chronic diastolic (congestive) heart failure: Secondary | ICD-10-CM | POA: Diagnosis not present

## 2018-04-02 DIAGNOSIS — T502X5A Adverse effect of carbonic-anhydrase inhibitors, benzothiadiazides and other diuretics, initial encounter: Secondary | ICD-10-CM | POA: Diagnosis not present

## 2018-04-02 DIAGNOSIS — Z96659 Presence of unspecified artificial knee joint: Secondary | ICD-10-CM | POA: Diagnosis present

## 2018-04-02 DIAGNOSIS — I110 Hypertensive heart disease with heart failure: Secondary | ICD-10-CM | POA: Diagnosis present

## 2018-04-02 DIAGNOSIS — I6521 Occlusion and stenosis of right carotid artery: Secondary | ICD-10-CM | POA: Diagnosis present

## 2018-04-02 DIAGNOSIS — R0902 Hypoxemia: Secondary | ICD-10-CM | POA: Diagnosis not present

## 2018-04-02 DIAGNOSIS — I480 Paroxysmal atrial fibrillation: Secondary | ICD-10-CM | POA: Diagnosis present

## 2018-04-02 DIAGNOSIS — Z8673 Personal history of transient ischemic attack (TIA), and cerebral infarction without residual deficits: Secondary | ICD-10-CM

## 2018-04-02 DIAGNOSIS — Z79899 Other long term (current) drug therapy: Secondary | ICD-10-CM

## 2018-04-02 DIAGNOSIS — J302 Other seasonal allergic rhinitis: Secondary | ICD-10-CM | POA: Diagnosis present

## 2018-04-02 DIAGNOSIS — R0602 Shortness of breath: Secondary | ICD-10-CM | POA: Diagnosis not present

## 2018-04-02 DIAGNOSIS — R350 Frequency of micturition: Secondary | ICD-10-CM | POA: Diagnosis present

## 2018-04-02 DIAGNOSIS — I700 Atherosclerosis of aorta: Secondary | ICD-10-CM | POA: Diagnosis present

## 2018-04-02 DIAGNOSIS — I442 Atrioventricular block, complete: Secondary | ICD-10-CM | POA: Diagnosis present

## 2018-04-02 HISTORY — DX: Heart failure, unspecified: I50.9

## 2018-04-02 HISTORY — DX: Generalized anxiety disorder: F41.1

## 2018-04-02 HISTORY — DX: Peripheral vascular disease, unspecified: I73.9

## 2018-04-02 HISTORY — DX: Benign prostatic hyperplasia without lower urinary tract symptoms: N40.0

## 2018-04-02 HISTORY — DX: Atherosclerotic heart disease of native coronary artery without angina pectoris: I25.10

## 2018-04-02 HISTORY — DX: Nonrheumatic aortic (valve) stenosis: I35.0

## 2018-04-02 HISTORY — DX: Disorder of arteries and arterioles, unspecified: I77.9

## 2018-04-02 LAB — PROTIME-INR
INR: 1.21
PROTHROMBIN TIME: 15.2 s (ref 11.4–15.2)

## 2018-04-02 LAB — CBC
HCT: 44.1 % (ref 39.0–52.0)
HEMOGLOBIN: 14.1 g/dL (ref 13.0–17.0)
MCH: 30.5 pg (ref 26.0–34.0)
MCHC: 32 g/dL (ref 30.0–36.0)
MCV: 95.5 fL (ref 78.0–100.0)
Platelets: 113 10*3/uL — ABNORMAL LOW (ref 150–400)
RBC: 4.62 MIL/uL (ref 4.22–5.81)
RDW: 13.9 % (ref 11.5–15.5)
WBC: 6.4 10*3/uL (ref 4.0–10.5)

## 2018-04-02 LAB — BASIC METABOLIC PANEL
Anion gap: 13 (ref 5–15)
BUN: 20 mg/dL (ref 8–23)
CO2: 23 mmol/L (ref 22–32)
CREATININE: 1.05 mg/dL (ref 0.61–1.24)
Calcium: 9.1 mg/dL (ref 8.9–10.3)
Chloride: 96 mmol/L — ABNORMAL LOW (ref 98–111)
GFR calc Af Amer: >60 mL/min (ref >60)
GFR, EST NON AFRICAN AMERICAN: 59 mL/min — AB (ref >60)
Glucose, Bld: 142 mg/dL — ABNORMAL HIGH (ref 70–99)
POTASSIUM: 3.8 mmol/L (ref 3.5–5.1)
Sodium: 132 mmol/L — ABNORMAL LOW (ref 135–145)

## 2018-04-02 LAB — I-STAT TROPONIN, ED: Troponin i, poc: 0.03 ng/mL (ref 0.00–0.08)

## 2018-04-02 LAB — TROPONIN I: Troponin I: 0.03 ng/mL (ref <0.03)

## 2018-04-02 LAB — BRAIN NATRIURETIC PEPTIDE: B NATRIURETIC PEPTIDE 5: 1143.9 pg/mL — AB (ref 0.0–100.0)

## 2018-04-02 MED ORDER — LOSARTAN POTASSIUM 50 MG PO TABS
100.0000 mg | ORAL_TABLET | Freq: Every day | ORAL | Status: DC
  Administered 2018-04-03: 100 mg via ORAL
  Filled 2018-04-02 (×2): qty 2

## 2018-04-02 MED ORDER — ESCITALOPRAM OXALATE 10 MG PO TABS
10.0000 mg | ORAL_TABLET | Freq: Every day | ORAL | Status: DC
Start: 2018-04-03 — End: 2018-04-07
  Administered 2018-04-03 – 2018-04-07 (×5): 10 mg via ORAL
  Filled 2018-04-02 (×5): qty 1

## 2018-04-02 MED ORDER — FUROSEMIDE 10 MG/ML IJ SOLN
40.0000 mg | Freq: Once | INTRAMUSCULAR | Status: AC
  Administered 2018-04-02: 40 mg via INTRAVENOUS
  Filled 2018-04-02: qty 4

## 2018-04-02 MED ORDER — APIXABAN 5 MG PO TABS
5.0000 mg | ORAL_TABLET | Freq: Two times a day (BID) | ORAL | Status: DC
  Administered 2018-04-03 (×3): 5 mg via ORAL
  Filled 2018-04-02 (×3): qty 1

## 2018-04-02 MED ORDER — BALSALAZIDE DISODIUM 750 MG PO CAPS
2250.0000 mg | ORAL_CAPSULE | Freq: Three times a day (TID) | ORAL | Status: DC
  Administered 2018-04-03 – 2018-04-07 (×10): 2250 mg via ORAL
  Filled 2018-04-02 (×17): qty 3

## 2018-04-02 MED ORDER — NITROGLYCERIN 2 % TD OINT
1.0000 [in_us] | TOPICAL_OINTMENT | Freq: Four times a day (QID) | TRANSDERMAL | Status: DC
  Administered 2018-04-02 – 2018-04-03 (×4): 1 [in_us] via TOPICAL
  Filled 2018-04-02: qty 30
  Filled 2018-04-02: qty 1

## 2018-04-02 MED ORDER — METOPROLOL SUCCINATE ER 50 MG PO TB24
50.0000 mg | ORAL_TABLET | Freq: Every day | ORAL | Status: DC
  Administered 2018-04-03 – 2018-04-07 (×5): 50 mg via ORAL
  Filled 2018-04-02 (×5): qty 1

## 2018-04-02 MED ORDER — LATANOPROST 0.005 % OP SOLN
1.0000 [drp] | Freq: Every day | OPHTHALMIC | Status: DC
  Administered 2018-04-03 – 2018-04-06 (×4): 1 [drp] via OPHTHALMIC
  Filled 2018-04-02: qty 2.5

## 2018-04-02 MED ORDER — SODIUM CHLORIDE 0.9% FLUSH
3.0000 mL | Freq: Two times a day (BID) | INTRAVENOUS | Status: DC
  Administered 2018-04-03: 3 mL via INTRAVENOUS
  Administered 2018-04-03: 10 mL via INTRAVENOUS
  Administered 2018-04-03 – 2018-04-06 (×4): 3 mL via INTRAVENOUS

## 2018-04-02 MED ORDER — AMLODIPINE BESYLATE 5 MG PO TABS
5.0000 mg | ORAL_TABLET | Freq: Every day | ORAL | Status: DC
  Administered 2018-04-03: 5 mg via ORAL
  Filled 2018-04-02 (×2): qty 1

## 2018-04-02 MED ORDER — FINASTERIDE 5 MG PO TABS
5.0000 mg | ORAL_TABLET | Freq: Every day | ORAL | Status: DC
  Administered 2018-04-03 – 2018-04-07 (×5): 5 mg via ORAL
  Filled 2018-04-02 (×5): qty 1

## 2018-04-02 MED ORDER — IPRATROPIUM-ALBUTEROL 0.5-2.5 (3) MG/3ML IN SOLN
3.0000 mL | Freq: Once | RESPIRATORY_TRACT | Status: AC
  Administered 2018-04-02: 3 mL via RESPIRATORY_TRACT
  Filled 2018-04-02: qty 3

## 2018-04-02 MED ORDER — ATORVASTATIN CALCIUM 20 MG PO TABS
20.0000 mg | ORAL_TABLET | Freq: Every day | ORAL | Status: DC
  Administered 2018-04-03 – 2018-04-07 (×5): 20 mg via ORAL
  Filled 2018-04-02 (×5): qty 1

## 2018-04-02 NOTE — ED Triage Notes (Signed)
Per EMS: pt from home with c/o shob.  EMS notes wheezing on right lung fields.  Pt was 87% on RA, placed on 4L.  Pta pt was administered an albuterol tx.  Pt denies any recent medication changes.

## 2018-04-02 NOTE — ED Provider Notes (Signed)
[facility] EMERGENCY DEPARTMENT Provider Note   CSN: [id number] Arrival date & time: 04/02/18  1911     History   Chief Complaint Chief Complaint  Patient presents with  . Shortness of Breath    HPI Dalton Townsend is a 84 y.o. male.  HPI Patient presents to the emergency room for evaluation of shortness of breath.  Patient states he has a history of aortic stenosis but normally does not have any issues with his breathing.  Today he noticed that he started having significant shortness of breath.  Any activity made the symptoms significantly worse.  Patient denies any trouble with any chest pain.  He has not been coughing.  He denies any trouble with fever or leg swelling.  EMS was called and they treated the patient with a dose of albuterol.  He is also hypoxic and was started on 4 L of nasal cannula.  Patient states he is feeling somewhat better since then.  Does not use breathing treatments or oxygen at home.  He does not have a history of COPD or asthma.  Patient states he smoked for a few years when he was in his 79s but that is it. Past Medical History:  Diagnosis Date  . Anemia    mild, hemoglobin 11.5  . Anxiety state, unspecified   . Aortic valve disorders   . Aortic valve stenosis   . Arthritis   . Atrioventricular block, complete ([place])   . Cardiac pacemaker in situ    3rd heart block  . CMV (cytomegalovirus infection) ([place]) 04/2010  . Colitis   . Coronary atherosclerosis of native coronary artery   . Dyslipidemia   . Edema   . Embolic stroke ([place])    right hemispheric embolic event  . Essential hypertension   . Functional diarrhea   . Heart murmur   . Hypercholesteremia   . Intestinal infection due to Clostridium difficile 03/2010  . Left sided ulcerative (chronic) colitis ([place])   . Mechanical complication due to cardiac pacemaker (electrode)   . Nonspecific abnormal finding in stool contents   . Occlusion and stenosis of carotid artery  with cerebral infarction   . PAF (paroxysmal atrial fibrillation) (HCC)    Hx of post op PAF  . Positive for microalbuminuria    On Benicar  . Postsurgical aortocoronary bypass status    3 vessel CAD, s/p CABG 04/1998  . Presence of permanent cardiac pacemaker   . PVD (peripheral vascular disease) ([place])   . Seasonal allergies   . Sick sinus syndrome ([place])   . Sigmoid diverticulosis   . TIA (transient ischemic attack)   . Ulcerative (chronic) enterocolitis ([place])   . 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 Ulcerative colitis     Patient Active Problem List   Diagnosis Date Noted  . Aortic stenosis 01/04/2014  . Atrial fibrillation ([place]) 11/22/2013  . Pacemaker 11/22/2013  . CLOSTRIDIUM DIFFICILE COLITIS 06/24/2010  . Essential hypertension 06/24/2010  . Ulcerative colitis ([place]) 06/24/2010  . CYTOMEGALOVIRAL DISEASE 05/21/2010    Past Surgical History:  Procedure Laterality Date  . CARDIAC CATHETERIZATION  1999  . CAROTID ENDARTERECTOMY Right 2004  . CATARACT EXTRACTION, BILATERAL  2011  . CORONARY ARTERY BYPASS GRAFT  04/1998   3 vessel CAD  . INCISION AND DRAINAGE OF WOUND Left 05/04/2016   Procedure: SURGICAL PREP FOR GRAFTING LEFT LEG WITH THERA SKIN APPLICATION;  Surgeon: Irene Limbo, MD;  Location: [facility]  OR;  Service: Clinical cytogeneticist;  Laterality: Left;  . INTRAOPERATIVE ARTERIOGRAM  06/2000  . PACEMAKER INSERTION  03/25/2010  . TOTAL KNEE ARTHROPLASTY  07/16/09  . TRANSURETHRAL RESECTION OF PROSTATE  1998        Home Medications    Prior to Admission medications   Medication Sig Start Date End Date Taking? Authorizing Provider  amLODipine (NORVASC) 5 MG tablet Take 1 tablet (5 mg total) by mouth daily. 04/28/15   Evans Lance, MD  atorvastatin (LIPITOR) 20 MG tablet Take 20 mg by mouth daily.    [provider]  balsalazide (COLAZAL) 750 MG capsule Take 2,250 mg by mouth 3 (three) times daily.    [provider]  ELIQUIS 5 MG TABS tablet TAKE 1 TABLET TWICE A DAY 11/29/17   Jerline Pain, MD  escitalopram (LEXAPRO) 10 MG tablet Take 10 mg by mouth daily.      [provider]  finasteride (PROSCAR) 5 MG tablet Take 5 mg by mouth daily.  01/13/15   [provider]  furosemide (LASIX) 40 MG tablet Take 40 mg by mouth 2 (two) times daily.    [provider]  latanoprost (XALATAN) 0.005 % ophthalmic solution Place 1 drop into both eyes at bedtime.     [provider]  losartan (COZAAR) 100 MG tablet Take 100 mg by mouth daily.  10/13/13   [provider]  metoprolol succinate (TOPROL-XL) 50 MG 24 hr tablet Take 50 mg by mouth daily. Take with or immediately following a meal.    [provider]  Multiple Vitamin (MULTIVITAMIN) tablet Take 1 tablet by mouth daily.      [provider]  potassium chloride (KLOR-CON) 10 MEQ CR tablet Take 10 mEq by mouth 2 (two) times daily.      [provider]    Family History Family History  Problem Relation Age of Onset  . Congestive Heart Failure Mother   . Leukemia Father   . Leukemia Unknown   . Heart failure Unknown   . Cancer Unknown     Social History Social History   Tobacco Use  . Smoking status: Former Smoker    Years: 10.00    Types: Cigarettes    Last attempt to quit: 08/30/1952    Years since quitting: 65.6  . Smokeless tobacco: Former Network engineer Use Topics  . Alcohol use: No  . Drug use: No     Allergies   No known allergies   Review of Systems Review of Systems  All other systems reviewed and are negative.    Physical Exam Updated Vital Signs BP (!) 179/92   Pulse 95   Temp 97.6 F (36.4 C) (Oral)   Resp (!) 32   SpO2 95%   Physical Exam  Constitutional: He appears well-developed and well-nourished. No distress.  HENT:  Head: Normocephalic and atraumatic.  Right Ear: External ear normal.  Left Ear: External ear normal.  Eyes:  Conjunctivae are normal. Right eye exhibits no discharge. Left eye exhibits no discharge. No scleral icterus.  Neck: Neck supple. No tracheal deviation present.  Cardiovascular: Normal rate, regular rhythm and intact distal pulses.  Murmur heard. Pulmonary/Chest: Effort normal. No stridor. No respiratory distress. He has wheezes. He has no rales.  Abdominal: Soft. Bowel sounds are normal. He exhibits no distension. There is no tenderness. There is no rebound and no guarding.  Musculoskeletal: He exhibits no edema or tenderness.  Neurological: He is alert. He has  normal strength. No cranial nerve deficit (no facial droop, extraocular movements intact, no slurred speech) or sensory deficit. He exhibits normal muscle tone. He displays no seizure activity. Coordination normal.  Skin: Skin is warm and dry. No rash noted.  Psychiatric: He has a normal mood and affect.  Nursing note and vitals reviewed.    ED Treatments / Results  Labs (all labs ordered are listed, but only abnormal results are displayed) Labs Reviewed  CBC - Abnormal; Notable for the following components:      Result Value   Platelets 113 (*)    All other components within normal limits  TROPONIN I - Abnormal; Notable for the following components:   Troponin I 0.03 (*)    All other components within normal limits  BRAIN NATRIURETIC PEPTIDE - Abnormal; Notable for the following components:   B Natriuretic Peptide 1,143.9 (*)    All other components within normal limits  PROTIME-INR  I-STAT TROPONIN, ED    EKG EKG Interpretation  Date/Time:  Sunday April 02 2018 20:11:50 EDT Ventricular Rate:  103 PR Interval:    QRS Duration: 186 QT Interval:  432 QTC Calculation: 566 R Axis:   -77 Text Interpretation:  Sinus tachycardia Atrial premature complexes Probable left atrial enlargement IVCD, consider atypical RBBB LVH with IVCD, LAD and secondary repol abnrm Prolonged QT interval No significant change since last tracing  Confirmed by Dorie Rank [phone]) on 04/02/2018 8:14:08 PM   Radiology Dg Chest Port 1 View  Result Date: 04/02/2018 CLINICAL DATA:  Shortness of breath, wheezing. EXAM: PORTABLE CHEST 1 VIEW COMPARISON:  Chest radiograph March 19, 2017 FINDINGS: Cardiac silhouette is mildly enlarged unchanged. Calcified aortic arch. Status post median sternotomy for CABG. Increased interstitial prominence. Trace pleural effusions. Patchy RIGHT lung base airspace opacity. Bandlike density LEFT lung base. RIGHT mid lung zone scarring. No pneumothorax. LEFT cardiac pacemaker in situ. Osteopenia. IMPRESSION: Increasing interstitial prominence seen with atypical infection or pulmonary edema with RIGHT lung base consolidation. Trace pleural effusions. LEFT lung base atelectasis/scarring. Stable cardiomegaly. Aortic Atherosclerosis (ICD10-I70.0). Electronically Signed   By: Elon Alas M.D.   On: 04/02/2018 19:42    Procedures Procedures (including critical care time)  Medications Ordered in ED Medications  furosemide (LASIX) injection 40 mg (has no administration in time range)  nitroGLYCERIN (NITROGLYN) 2 % ointment 1 inch (has no administration in time range)  ipratropium-albuterol (DUONEB) 0.5-2.5 (3) MG/3ML nebulizer solution 3 mL (3 mLs Nebulization Given 04/02/18 2010)     Initial Impression / Assessment and Plan / ED Course  I have reviewed the triage vital signs and the nursing notes.  Pertinent labs & imaging results that were available during my care of the patient were reviewed by me and considered in my medical decision making (see chart for details).  Clinical Course as of Apr 02 2056  Nancy Fetter Apr 02, 2018  2056 Chest x-ray suggests pulmonary edema or atypical infection.  Clinically I suspect pulmonary edema   [JK]  2057 Patient is afebrile and has a normal white blood cell count.  His BNP is elevated.  This is consistent with a CHF exacerbation.   [JK]    Clinical Course User Index [JK] Dorie Rank, MD    Patient presented to the emergency room for evaluation of shortness of breath.  On exam he had significant wheezing. I doubt pneumonia.  Patient does not have a history of COPD or asthma. I suspect this is most likely related to congestive heart failure.  I  have ordered a dose of diuretics and will start the patient on nitroglycerin paste.  I will consult the medical service for admission.  Final Clinical Impressions(s) / ED Diagnoses   Final diagnoses:  Acute on chronic congestive heart failure, unspecified heart failure type ([place])  Aortic valve stenosis, etiology of cardiac valve disease unspecified      Dorie Rank, MD 04/02/18 [phone]

## 2018-04-02 NOTE — H&P (Signed)
History and Physical   Dalton Townsend DGU:[id number] DOB: 04-14-1923 DOA: 04/02/2018  PCP: Lajean Manes, MD  Chief Complaint: shortness of breath   HPI: this is a 84 year old man with medical problems including severe aortic stenosis, diastolic heart failure, atrial fibrillation on anticoagulation with DOAC, coronary artery disease status post CABG, ulcerative colitis under good control who presents with shortness of breath.  History is obtained via discussion with the patient, family at bedside, as well as chart review. The patient lives with his spouse in [place], he goes the [employer] and performs aerobic activity for about 60 minutes, he performs this 4 times weekly. He is independent in his ADLs and IADLs, he walks a single pronged cane. 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  He reports that he experienced acute shortness of breath that occurred at 4 PM on the afternoon of admission. He reports he was taking a nap and when he ambulated to urinate that dyspnea was severe. He reports gaining 5 pounds in the past year, denies any noticeable weight gain over the past few weeks. He denies chest pain, palpitations, dizziness, orthopnea, nausea, vomiting, syncope. He reports that he is supposed to take Lasix 40 mg at least daily, he stopped taking the Lasix on the Friday prior to admission-he cites that he can't go anywhere when he takes the medication due to frequent urination. Due to his dyspnea, EMS was called by the emergency room. The patient reports in the past she is always reported minimal/her no symptoms related to his aortic stenosis.  The patient reports that he has been able to exercise on his normal routine at the [facility] [employer] without dyspnea on exertion. However, he does report of the past few weeks when doing chores around the house having  some mild dyspnea on exertion.  ED Course: in the emergency room vital signs remarkable for tachycardia with a heart rate between 110 and 120, O2 sat nadir of 87% requiring 2-4 L nasal cane oxygen to maintain O2 saturations above 92%, respiratory rate 95G, systolic blood pressure ranging from 150-180. Diagnostics remarkable for CBC with mild thrombocytopenia with platelet count of 113,000.  BMP with creatinine 1.05. BNP of 1400.  Chest x-ray revealed increasing interstitial prominence seen with atypical infection or poor edema with right lung base consolidation, trace pleural effusions, stable cardiac megaly. Troponin within normal limits at 0.03. Emergency medicine team give the patient 40 mg of IV Lasix, Hospital medicine consult for further management.  Review of Systems: A complete ROS was obtained; pertinent positives negatives are denoted in the HPI. Otherwise, all systems are negative.   Past Medical History:  Diagnosis Date  . Anemia    mild, hemoglobin 11.5  . Anxiety state, unspecified   . Aortic valve disorders   . Aortic valve stenosis   . Arthritis   . Atrioventricular block, complete ([place])   . Cardiac pacemaker in situ    3rd heart block  . CMV (cytomegalovirus infection) ([place]) 04/2010  . Colitis   . Coronary atherosclerosis of native coronary artery   . Dyslipidemia   . Edema   . Embolic stroke ([place])    right hemispheric embolic event  . Essential hypertension   . Functional diarrhea   . Heart murmur   . Hypercholesteremia   . Intestinal infection due to Clostridium difficile 03/2010  .  Left sided ulcerative (chronic) colitis ([place])   . Mechanical complication due to cardiac pacemaker (electrode)   . Nonspecific abnormal finding in stool contents   . Occlusion and stenosis of carotid artery with cerebral infarction   . PAF (paroxysmal atrial fibrillation) (HCC)    Hx of post op PAF  . Positive for microalbuminuria    On Benicar  . Postsurgical aortocoronary bypass  status    3 vessel CAD, s/p CABG 04/1998  . Presence of permanent cardiac pacemaker   . PVD (peripheral vascular disease) ([place])   . Seasonal allergies   . Sick sinus syndrome ([place])   . Sigmoid diverticulosis   . TIA (transient ischemic attack)   . Ulcerative (chronic) enterocolitis ([place])   . 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 Ulcerative colitis    Social History   Socioeconomic History  . Marital status: Married    Spouse name: Not on file  . Number of children: 3  . Years of education: Not on file  . Highest education level: Not on file  Occupational History  . Occupation: retired  Scientific laboratory technician  . Financial resource strain: Not on file  . Food insecurity:    Worry: Not on file    Inability: Not on file  . Transportation needs:    Medical: Not on file    Non-medical: Not on file  Tobacco Use  . Smoking status: Former Smoker    Years: 10.00    Types: Cigarettes    Last attempt to quit: 08/30/1952    Years since quitting: 65.6  . Smokeless tobacco: Former Network engineer and Sexual Activity  . Alcohol use: No  . Drug use: No  . Sexual activity: Not on file  Lifestyle  . Physical activity:    Days per week: Not on file    Minutes per session: Not on file  . Stress: Not on file  Relationships  . Social connections:    Talks on phone: Not on file    Gets together: Not on file    Attends religious service: Not on file    Active member of club or organization: Not on file    Attends meetings of clubs or organizations: Not on file    Relationship status: Not on file  . Intimate partner violence:    Fear of current or ex partner: Not on file    Emotionally abused: Not on file    Physically abused: Not on file    Forced sexual activity: Not on file  Other Topics Concern  . Not on file  Social History Narrative  . Not on file   Family History  Problem Relation Age of Onset  . Congestive Heart Failure Mother     . Leukemia Father   . Leukemia Unknown   . Heart failure Unknown   . Cancer Unknown     Physical Exam: Vitals:   04/02/18 2045 04/02/18 2100 04/02/18 2115 04/02/18 2130  BP: (!) 169/88 (!) 158/93 (!) 171/93 (!) 156/83  Pulse: (!) 111 86 72 79  Resp: 20 (!) 22 18 (!) 29  Temp:      TempSrc:      SpO2: 93% 93% 95% 94%   General: Appears calm and comfortable, elderly white man ENT: Mild hard of hearing, MMM. Cardiovascular: 3/6 systolic murmur at RUSB, irregular, 1+ LE edema bilaterally Respiratory: RR 24-28, not conversationally dyspneic, some upper airway wheezes, b/l lung bases with crackles /  reduced breath sounds, requiring 3-4 L  O2 Abdomen: Soft, non-tender.  Skin: No rash or induration seen on limited exam. Musculoskeletal: Grossly normal tone BUE/BLE. Appropriate ROM.  Psychiatric: Grossly normal mood and affect. Neurologic: Moves all extremities in coordinated fashion. Oriented to year (2019)  I have personally reviewed the following labs, culture data, and imaging studies.  Assessment/Plan:  #Acute hypoxic respiratory failure 2/2 to acute decompensated diastolic (preserved EF) heart failure Course: On admission - acutely worsened SOB with exertion in setting of furosemide non-adherence x 3 d ays (due to frequent urination); BNP of 1400, CXR suggestive of interstitial edema, reduced breath sounds / crackles at lung bases A/P: suspect furosemide non-adherence may be trigger for this acute decompensation; however, with underlying severe AS - cannot rule out concomitant valvular heart disease worsening. Will assess response to furosemide 40 mg IV given by emergency medicine team; closely monitor hemodynamics. O2 support to maintain O2 > 92%.  Will continue other cardiac medications (BB, ARB, CCB, atorvastatin). Repeat TTE. If symptoms do not improve with volume optimization, plan to consult cardiology team for their input. Continue condom cath for accurate I&O for nocturnal  diuresis.  #Other problems: -Atrial fibrillation: continue home DOAC with BB, mildly tachycardic on admission -CAD s/p CABG: not on home ASA, on statin - continue, no CP at this time -Ulcerative colitis: well controlled on home regimen, continue -SSRI use: uncertain indication, no contraindications at this time, thus - continue -BPH: continue home finasteride  DVT prophylaxis: On AC with Eliqus Code Status: Full code  - patient voiced this preference on admission Disposition Plan: Anticipate D/C home in 2-5 days Consults called: none Admission status: admit to hospital medicine service   Cheri Rous, MD [facility] [phone]  If 7PM-7AM, please contact night-coverage [email or web address] Password [email or web address]

## 2018-04-03 ENCOUNTER — Encounter (HOSPITAL_COMMUNITY): Payer: Self-pay | Admitting: Emergency Medicine

## 2018-04-03 ENCOUNTER — Inpatient Hospital Stay (HOSPITAL_COMMUNITY): Payer: Medicare Other

## 2018-04-03 ENCOUNTER — Other Ambulatory Visit: Payer: Self-pay

## 2018-04-03 DIAGNOSIS — I340 Nonrheumatic mitral (valve) insufficiency: Secondary | ICD-10-CM

## 2018-04-03 DIAGNOSIS — I509 Heart failure, unspecified: Secondary | ICD-10-CM

## 2018-04-03 DIAGNOSIS — E876 Hypokalemia: Secondary | ICD-10-CM

## 2018-04-03 DIAGNOSIS — I5033 Acute on chronic diastolic (congestive) heart failure: Secondary | ICD-10-CM

## 2018-04-03 LAB — CBC
HCT: 37.3 % — ABNORMAL LOW (ref 39.0–52.0)
HEMOGLOBIN: 12.1 g/dL — AB (ref 13.0–17.0)
MCH: 30.5 pg (ref 26.0–34.0)
MCHC: 32.4 g/dL (ref 30.0–36.0)
MCV: 94 fL (ref 78.0–100.0)
Platelets: 92 10*3/uL — ABNORMAL LOW (ref 150–400)
RBC: 3.97 MIL/uL — AB (ref 4.22–5.81)
RDW: 13.8 % (ref 11.5–15.5)
WBC: 3.6 10*3/uL — AB (ref 4.0–10.5)

## 2018-04-03 LAB — BASIC METABOLIC PANEL
Anion gap: 9 (ref 5–15)
BUN: 21 mg/dL (ref 8–23)
CHLORIDE: 104 mmol/L (ref 98–111)
CO2: 25 mmol/L (ref 22–32)
Calcium: 8.8 mg/dL — ABNORMAL LOW (ref 8.9–10.3)
Creatinine, Ser: 0.98 mg/dL (ref 0.61–1.24)
GFR calc Af Amer: >60 mL/min (ref >60)
Glucose, Bld: 112 mg/dL — ABNORMAL HIGH (ref 70–99)
POTASSIUM: 3.3 mmol/L — AB (ref 3.5–5.1)
Sodium: 138 mmol/L (ref 135–145)

## 2018-04-03 MED ORDER — ORAL CARE MOUTH RINSE
15.0000 mL | Freq: Two times a day (BID) | OROMUCOSAL | Status: DC
Start: 2018-04-03 — End: 2018-04-07
  Administered 2018-04-03 – 2018-04-06 (×4): 15 mL via OROMUCOSAL

## 2018-04-03 MED ORDER — FUROSEMIDE 10 MG/ML IJ SOLN
40.0000 mg | Freq: Two times a day (BID) | INTRAMUSCULAR | Status: DC
  Administered 2018-04-03 – 2018-04-04 (×2): 40 mg via INTRAVENOUS
  Filled 2018-04-03 (×2): qty 4

## 2018-04-03 MED ORDER — FUROSEMIDE 10 MG/ML IJ SOLN
40.0000 mg | Freq: Every day | INTRAMUSCULAR | Status: DC
  Administered 2018-04-03: 40 mg via INTRAVENOUS
  Filled 2018-04-03: qty 4

## 2018-04-03 MED ORDER — POTASSIUM CHLORIDE CRYS ER 20 MEQ PO TBCR: 40.0000 meq | EXTENDED_RELEASE_TABLET | Freq: Once | ORAL | Status: DC

## 2018-04-03 MED ORDER — POTASSIUM CHLORIDE CRYS ER 20 MEQ PO TBCR
40.0000 meq | EXTENDED_RELEASE_TABLET | Freq: Once | ORAL | Status: AC
  Administered 2018-04-03: 40 meq via ORAL
  Filled 2018-04-03: qty 2

## 2018-04-03 MED ORDER — POTASSIUM CHLORIDE CRYS ER 10 MEQ PO TBCR
10.0000 meq | EXTENDED_RELEASE_TABLET | Freq: Once | ORAL | Status: AC
  Administered 2018-04-03: 10 meq via ORAL
  Filled 2018-04-03: qty 1

## 2018-04-03 NOTE — Plan of Care (Signed)
  Problem: Education: Goal: Knowledge of General Education information will improve Description Including pain rating scale, medication(s)/side effects and non-pharmacologic comfort measures Outcome: Progressing   Problem: Clinical Measurements: Goal: Ability to maintain clinical measurements within normal limits will improve Outcome: Progressing Goal: Respiratory complications will improve Outcome: Progressing Goal: Cardiovascular complication will be avoided Outcome: Progressing   Problem: Nutrition: Goal: Adequate nutrition will be maintained Outcome: Progressing   Problem: Coping: Goal: Level of anxiety will decrease Outcome: Progressing   Problem: Elimination: Goal: Will not experience complications related to bowel motility Outcome: Progressing   Problem: Pain Managment: Goal: General experience of comfort will improve Outcome: Completed/Met   Problem: Skin Integrity: Goal: Risk for impaired skin integrity will decrease Outcome: Progressing   Problem: Activity: Goal: Capacity to carry out activities will improve Outcome: Progressing Note:  Patient requesting to ambulate today as to not get weak.

## 2018-04-03 NOTE — Progress Notes (Signed)
  Echocardiogram 2D Echocardiogram has been performed.  Dalton Townsend 04/03/2018, 4:44 PM

## 2018-04-03 NOTE — ED Notes (Signed)
Attempted to call report and was placed on hold. After waiting 5 minutes, this RN hung up and called back.

## 2018-04-03 NOTE — Progress Notes (Signed)
Patient tolerated ambulating 250 ft in the halls without oxygen therapy. Patient currently in chair, states he will eat lunch here and return to bed. Oxygen saturations on room air at 93-94%, will remove keep nasal cannula off at this time.

## 2018-04-03 NOTE — Progress Notes (Signed)
PROGRESS NOTE    Dalton Townsend  YFV:[id number] DOB: 27-Feb-1923 DOA: 04/02/2018 PCP: Lajean Manes, MD   Brief Narrative:84 year old man with medical problems including severe aortic stenosis, diastolic heart failure, atrial fibrillation on anticoagulation with DOAC, coronary artery disease status post CABG, ulcerative colitis under good control who presents with shortness of breath.  History is obtained via discussion with the patient, family at bedside, as well as chart review. The patient lives with his spouse in [place], he goes the [employer] and performs aerobic activity for about 60 minutes, he performs this 4 times weekly. He is independent in his ADLs and IADLs, he walks a single pronged cane. 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  He reports that he experienced acute shortness of breath that occurred at 4 PM on the afternoon of admission. He reports he was taking a nap and when he ambulated to urinate that dyspnea was severe. He reports gaining 5 pounds in the past year, denies any noticeable weight gain over the past few weeks. He denies chest pain, palpitations, dizziness, orthopnea, nausea, vomiting, syncope. He reports that he is supposed to take Lasix 40 mg at least daily, he stopped taking the Lasix on the Friday prior to admission-he cites that he can't go anywhere when he takes the medication due to frequent urination. Due to his dyspnea, EMS was called by the emergency room. The patient reports in the past she is always reported minimal/her no symptoms related to his aortic stenosis.  The patient reports that he has been able to exercise on his normal routine at the [facility] without dyspnea on exertion. However, he does report of the past few weeks when doing chores around the house having some mild dyspnea on exertion.  ED  Course: in the emergency room vital signs remarkable for tachycardia with a heart rate between 110 and 120, O2 sat nadir of 87% requiring 2-4 L nasal cane oxygen to maintain O2 saturations above 92%, respiratory rate 16B, systolic blood pressure ranging from 150-180. Diagnostics remarkable for CBC with mild thrombocytopenia with platelet count of 113,000.  BMP with creatinine 1.05. BNP of 1400.  Chest x-ray revealed increasing interstitial prominence seen with atypical infection or poor edema with right lung base consolidation, trace pleural effusions, stable cardiac megaly. Troponin within normal limits at 0.03. Emergency medicine team give the patient 40 mg of IV Lasix, Hospital medicine consult for further management.      Assessment & Plan:   Active Problems:   Heart failure (HCC)  #Acute hypoxic respiratory failure 2/2 to acute decompensated diastolic (preserved EF) heart failure/SEVERE AS- Course: On admission - acutely worsened SOB with exertion in setting of furosemide non-adherence x 3 d ays (due to frequent urination); BNP of 1400, CXR suggestive of interstitial edema, reduced breath sounds / crackles at lung bases A/P: suspect furosemide non-adherence may be trigger for this acute decompensation; however, with underlying severe AS - cannot rule out concomitant valvular heart disease worsening.Restart lasix 40 mg. Will continue other cardiac medications (BB, ARB, CCB, atorvastatin).cardiology consult pending. Strict I/o daily weights.  Weight from last night is 193 pounds patient reports that he is his dry weight at home.  #Other problems: -Atrial fibrillation: continue home DOAC with BB, mildly tachycardic on admission -CAD s/p CABG: not on home ASA, on statin - continue, no CP  at this time -Ulcerative colitis: well controlled on home regimen, continue -SSRI use: uncertain indication, no contraindications at this time, thus - continue -BPH: continue home finasteride -Hypokalemia  repleted recheck. -pancytopenia unclear etio follow up.     DVT prophylaxis: eliquis Code Status:full Family Communication:dw wife and daughter Disposition Plan:  tbd Consultants: cardiology pending  Procedures:none Antimicrobials none Subjective: Feels better than yesterday.  Objective: Vitals:   04/03/18 0135 04/03/18 0439 04/03/18 0658 04/03/18 0942  BP:  (!) 147/83  (!) 163/89  Pulse:  75  74  Resp:  16  18  Temp:  98.5 F (36.9 C)  98 F (36.7 C)  TempSrc:    Oral  SpO2:  96% 94% 100%  Weight: 87.9 kg (193 lb 12.6 oz)     Height: 5' 11"  (1.803 m)      No intake or output data in the 24 hours ending 04/03/18 1013 Filed Weights   04/03/18 0135  Weight: 87.9 kg (193 lb 12.6 oz)    Examination:  General exam: Appears calm and comfortable  Respiratory system: decreased breath sounds auscultation. Respiratory effort normal. Cardiovascular system: S1 & S2 heard, RRR. No JVD, murmurs, rubs, gallops or clicks. No pedal edema.ESM LOUD. Gastrointestinal system: Abdomen is nondistended, soft and nontender. No organomegaly or masses felt. Normal bowel sounds heard. Central nervous system: Alert and oriented. No focal neurological deficits. Extremities: TRACE EDEMA Skin: No rashes, lesions or ulcers Psychiatry: Judgement and insight appear normal. Mood & affect appropriate.     Data Reviewed: I have personally reviewed following labs and imaging studies  CBC: Recent Labs  Lab 04/02/18 1926 04/03/18 0459  WBC 6.4 3.6*  HGB 14.1 12.1*  HCT 44.1 37.3*  MCV 95.5 94.0  PLT 113* 92*   Basic Metabolic Panel: Recent Labs  Lab 04/02/18 1926 04/03/18 0459  NA 132* 138  K 3.8 3.3*  CL 96* 104  CO2 23 25  GLUCOSE 142* 112*  BUN 20 21  CREATININE 1.05 0.98  CALCIUM 9.1 8.8*   GFR: Estimated Creatinine Clearance: 51.2 mL/min (by C-G formula based on SCr of 0.98 mg/dL). Liver Function Tests: No results for input(s): AST, ALT, ALKPHOS, BILITOT, PROT, ALBUMIN  in the last 168 hours. No results for input(s): LIPASE, AMYLASE in the last 168 hours. No results for input(s): AMMONIA in the last 168 hours. Coagulation Profile: Recent Labs  Lab 04/02/18 1926  INR 1.21   Cardiac Enzymes: Recent Labs  Lab 04/02/18 1926  TROPONINI 0.03*   BNP (last 3 results) No results for input(s): PROBNP in the last 8760 hours. HbA1C: No results for input(s): HGBA1C in the last 72 hours. CBG: No results for input(s): GLUCAP in the last 168 hours. Lipid Profile: No results for input(s): CHOL, HDL, LDLCALC, TRIG, CHOLHDL, LDLDIRECT in the last 72 hours. Thyroid Function Tests: No results for input(s): TSH, T4TOTAL, FREET4, T3FREE, THYROIDAB in the last 72 hours. Anemia Panel: No results for input(s): VITAMINB12, FOLATE, FERRITIN, TIBC, IRON, RETICCTPCT in the last 72 hours. Sepsis Labs: No results for input(s): PROCALCITON, LATICACIDVEN in the last 168 hours.  No results found for this or any previous visit (from the past 240 hour(s)).       Radiology Studies: Dg Chest Port 1 View  Result Date: 04/02/2018 CLINICAL DATA:  Shortness of breath, wheezing. EXAM: PORTABLE CHEST 1 VIEW COMPARISON:  Chest radiograph March 19, 2017 FINDINGS: Cardiac silhouette is mildly enlarged unchanged. Calcified aortic arch. Status post median sternotomy for CABG. Increased interstitial prominence. Trace  pleural effusions. Patchy RIGHT lung base airspace opacity. Bandlike density LEFT lung base. RIGHT mid lung zone scarring. No pneumothorax. LEFT cardiac pacemaker in situ. Osteopenia. IMPRESSION: Increasing interstitial prominence seen with atypical infection or pulmonary edema with RIGHT lung base consolidation. Trace pleural effusions. LEFT lung base atelectasis/scarring. Stable cardiomegaly. Aortic Atherosclerosis (ICD10-I70.0). Electronically Signed   By: Elon Alas M.D.   On: 04/02/2018 19:42        Scheduled Meds: . amLODipine  5 mg Oral Daily  . apixaban  5 mg  Oral BID  . atorvastatin  20 mg Oral Daily  . balsalazide  2,250 mg Oral TID WC  . escitalopram  10 mg Oral Daily  . finasteride  5 mg Oral Daily  . furosemide  40 mg Intravenous Daily  . latanoprost  1 drop Both Eyes QHS  . losartan  100 mg Oral Daily  . metoprolol succinate  50 mg Oral Daily  . nitroGLYCERIN  1 inch Topical Q6H  . potassium chloride  10 mEq Oral Once  . sodium chloride flush  3 mL Intravenous Q12H   Continuous Infusions:   LOS: 1 day     Georgette Shell, MD [facility]  If 7PM-7AM, please contact night-coverage [email or web address] Password [email or web address] 04/03/2018, 10:13 AM

## 2018-04-03 NOTE — Discharge Instructions (Addendum)

## 2018-04-03 NOTE — Consult Note (Addendum)
Cardiology Consult    Patient ID: [place] Townsend MRN: [id number], DOB/AGE: 1923/02/03   Admit date: 04/02/2018 Date of Consult: 04/03/2018  Primary Physician: Lajean Manes, MD Primary Cardiologist: Dr. Marlou Porch Requesting Provider: Dr. Rodena Piety Reason for Consultation: CHF  Dalton Townsend is a 84 y.o. male who is being seen today for the evaluation of CHF at the request of Dr. Rodena Piety.  Patient Profile    84 yo male with PMH of CAD [place]/p CABG, severe AS, SSS [place]/p PPM placed 7/11, HTN, HL, anemia, and CVA who presented with acute shortness of breath and found to have volume overload.   Past Medical History   Past Medical History:  Diagnosis Date  . Anemia    mild, hemoglobin 11.5  . Anxiety state, unspecified   . Aortic valve disorders   . Aortic valve stenosis   . Arthritis   . Atrioventricular block, complete ([place])   . Cardiac pacemaker in situ    3rd heart block  . CMV (cytomegalovirus infection) ([place]) 04/2010  . Colitis   . Coronary atherosclerosis of native coronary artery   . Dyslipidemia   . Edema   . Embolic stroke ([place])    right hemispheric embolic event  . Essential hypertension   . Functional diarrhea   . Heart murmur   . Hypercholesteremia   . Intestinal infection due to Clostridium difficile 03/2010  . Left sided ulcerative (chronic) colitis ([place])   . Mechanical complication due to cardiac pacemaker (electrode)   . Nonspecific abnormal finding in stool contents   . Occlusion and stenosis of carotid artery with cerebral infarction   . PAF (paroxysmal atrial fibrillation) (HCC)    Hx of post op PAF  . Positive for microalbuminuria    On Benicar  . Postsurgical aortocoronary bypass status    3 vessel CAD, [place]/p CABG 04/1998  . Presence of permanent cardiac pacemaker   . PVD (peripheral vascular disease) ([place])   . Seasonal allergies   . Sick sinus syndrome ([place])   . Sigmoid diverticulosis   . TIA (transient ischemic attack)   . Ulcerative (chronic)  enterocolitis ([place])   . 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 Ulcerative colitis     Past Surgical History:  Procedure Laterality Date  . CARDIAC CATHETERIZATION  1999  . CAROTID ENDARTERECTOMY Right 2004  . CATARACT EXTRACTION, BILATERAL  2011  . CORONARY ARTERY BYPASS GRAFT  04/1998   3 vessel CAD  . INCISION AND DRAINAGE OF WOUND Left 05/04/2016   Procedure: SURGICAL PREP FOR GRAFTING LEFT LEG WITH THERA SKIN APPLICATION;  Surgeon: Irene Limbo, MD;  Location: [facility];  Service: Plastics;  Laterality: Left;  . INTRAOPERATIVE ARTERIOGRAM  06/2000  . PACEMAKER INSERTION  03/25/2010  . TOTAL KNEE ARTHROPLASTY  07/16/09  . TRANSURETHRAL RESECTION OF PROSTATE  1998    Allergies  Allergies  Allergen Reactions  . No Known Allergies     History of Present Illness    Dalton Townsend is a 84 year old male with past medical history of CAD [place]/p CABG, severe AS, SSS [place]/p PPM placed 7/11, HTN, HL, anemia, and CVA.  He is followed by Dr. Marlou Porch as an outpatient.  He underwent bypass in 1999 with no recurrence of angina.  He did have episode of postoperative atrial fibrillation with no further recurrence.  He has not been on anticoagulation, and did not want to start treatment for last discussion with Dr. Marlou Porch.  He underwent a  pacemaker insertion in 2011 after being diagnosed with sick sinus syndrome, and is followed by Dr. Lovena Le.  He is known to have severe aortic stenosis which has been followed by echoes recently in the office.  Did have a recent discussion with Dr. Marlou Porch about TAVR, but he opted to wait until symptoms became more bothersome.  He currently lives in [place] with his spouse and goes to the [employer] regularly.  He is very independent, and walks with a cane.  He presented to the ED on 04/02/2013 with progressive shortness of breath over the past couple of days.  States symptoms worsened yesterday afternoon whenever he was  walking to the bathroom.  He told his wife about his symptoms, and his granddaughter came over and eventually called EMS for transport to the hospital.  In talking with patient he reports he is supposed to be on daily Lasix with a potassium supplement, but admits to taking this more sporadically than daily as scheduled.  Had noticed some increased weight gain over the past couple of days and worsening lower extremity edema.  In the ED his labs showed sodium 132 with other electrolytes stable, creatinine 1.05, troponin 0.03, BNP 1143,  Hgb 14.4. CXR with edema. He was given IV lasix with good UOP. Admitted to Internal Medicine for further management. Actually much improved today, and was able to ambulate in the hallway.   Inpatient Medications    . amLODipine  5 mg Oral Daily  . apixaban  5 mg Oral BID  . atorvastatin  20 mg Oral Daily  . balsalazide  2,250 mg Oral TID WC  . escitalopram  10 mg Oral Daily  . finasteride  5 mg Oral Daily  . furosemide  40 mg Intravenous Daily  . latanoprost  1 drop Both Eyes QHS  . losartan  100 mg Oral Daily  . mouth rinse  15 mL Mouth Rinse BID  . metoprolol succinate  50 mg Oral Daily  . nitroGLYCERIN  1 inch Topical Q6H  . potassium chloride  40 mEq Oral Once  . sodium chloride flush  3 mL Intravenous Q12H    Family History    Family History  Problem Relation Age of Onset  . Congestive Heart Failure Mother   . Leukemia Father   . Leukemia Unknown   . Heart failure Unknown   . Cancer Unknown     Social History    Social History   Socioeconomic History  . Marital status: Married    Spouse name: Not on file  . Number of children: 3  . Years of education: Not on file  . Highest education level: Not on file  Occupational History  . Occupation: retired  Scientific laboratory technician  . Financial resource strain: Not on file  . Food insecurity:    Worry: Not on file    Inability: Not on file  . Transportation needs:    Medical: Not on file     Non-medical: Not on file  Tobacco Use  . Smoking status: Former Smoker    Years: 10.00    Types: Cigarettes    Last attempt to quit: 08/30/1952    Years since quitting: 65.6  . Smokeless tobacco: Former Network engineer and Sexual Activity  . Alcohol use: No  . Drug use: No  . Sexual activity: Not on file  Lifestyle  . Physical activity:    Days per week: Not on file    Minutes per session: Not on file  .  Stress: Not on file  Relationships  . Social connections:    Talks on phone: Not on file    Gets together: Not on file    Attends religious service: Not on file    Active member of club or organization: Not on file    Attends meetings of clubs or organizations: Not on file    Relationship status: Not on file  . Intimate partner violence:    Fear of current or ex partner: Not on file    Emotionally abused: Not on file    Physically abused: Not on file    Forced sexual activity: Not on file  Other Topics Concern  . Not on file  Social History Narrative  . Not on file     Review of Systems    See HPI  All other systems reviewed and are otherwise negative except as noted above.  Physical Exam    Blood pressure (!) 163/89, pulse 74, temperature 98 F (36.7 C), temperature source Oral, resp. rate 18, height 5' 11"  (1.803 m), weight 193 lb 12.6 oz (87.9 kg), SpO2 94 %.  General: Pleasant, older WM, NAD Psych: Normal affect. Neuro: Alert and oriented X 3. Moves all extremities spontaneously. HEENT: Normal  Neck: Supple, mild JVD. Lungs:  Resp regular and unlabored, CTA. Heart: RRR, 4/6 harsh systolic murmur. Abdomen: Soft, non-tender, non-distended, BS + x 4.  Extremities: No clubbing, cyanosis, 1+ LE edema. DP/PT/Radials 2+ and equal bilaterally.  Labs    Troponin [facility] of Care Test) Recent Labs    04/02/18 1928  TROPIPOC 0.03   Recent Labs    04/02/18 1926  TROPONINI 0.03*   Lab Results  Component Value Date   WBC 3.6 (L) 04/03/2018   HGB 12.1 (L)  04/03/2018   HCT 37.3 (L) 04/03/2018   MCV 94.0 04/03/2018   PLT 92 (L) 04/03/2018    Recent Labs  Lab 04/03/18 0459  NA 138  K 3.3*  CL 104  CO2 25  BUN 21  CREATININE 0.98  CALCIUM 8.8*  GLUCOSE 112*   No results found for: CHOL, HDL, LDLCALC, TRIG No results found for: [facility]   Radiology Studies    Dg Chest Port 1 View  Result Date: 04/02/2018 CLINICAL DATA:  Shortness of breath, wheezing. EXAM: PORTABLE CHEST 1 VIEW COMPARISON:  Chest radiograph March 19, 2017 FINDINGS: Cardiac silhouette is mildly enlarged unchanged. Calcified aortic arch. Status post median sternotomy for CABG. Increased interstitial prominence. Trace pleural effusions. Patchy RIGHT lung base airspace opacity. Bandlike density LEFT lung base. RIGHT mid lung zone scarring. No pneumothorax. LEFT cardiac pacemaker in situ. Osteopenia. IMPRESSION: Increasing interstitial prominence seen with atypical infection or pulmonary edema with RIGHT lung base consolidation. Trace pleural effusions. LEFT lung base atelectasis/scarring. Stable cardiomegaly. Aortic Atherosclerosis (ICD10-I70.0). Electronically Signed   By: Elon Alas M.D.   On: 04/02/2018 19:42    ECG & Cardiac Imaging    EKG:  The EKG was personally reviewed and demonstrates ST, Vpaced  Echo: 12/20/17  Study Conclusions  - Left ventricle: The cavity size was normal. Wall thickness was   increased in a pattern of moderate LVH. Systolic function was   normal. The estimated ejection fraction was in the range of 55%   to 60%. Wall motion was normal; there were no regional wall   motion abnormalities. Doppler parameters are consistent with   abnormal left ventricular relaxation (grade 1 diastolic   dysfunction). Doppler parameters are consistent with high   ventricular filling  pressure. - Aortic valve: Trileaflet; severely calcified leaflets. There was   severe stenosis. There was mild regurgitation. Peak velocity ([place]):   393 cm/[place]. Mean gradient  ([place]): 40 mm Hg. Valve area (VTI): 0.79   cm^2. Valve area (Vmax): 0.82 cm^2. Valve area (Vmean): 0.69   cm^2. - Mitral valve: Calcified annulus. Normal thickness leaflets .   There was mild regurgitation. - Left atrium: The atrium was moderately dilated. - Right ventricle: The cavity size was moderately dilated. Pacer   wire or catheter noted in right ventricle. Systolic function was   reduced. - Right atrium: The atrium was moderately dilated. Pacer wire or   catheter noted in right atrium. - Atrial septum: No defect or patent foramen ovale was identified. - Tricuspid valve: There was mild-moderate regurgitation. - Pulmonary arteries: PA peak pressure: 36 mm Hg ([place]).  Impressions:  - When compared to the study dated 06/14/17, there has been no   significant change in the aortic valve mean gradient.  Assessment & Plan    84 yo male with PMH of CAD [place]/p CABG, severe AS, SSS [place]/p PPM placed 7/11, HTN, HL, anemia, and CVA who presented with acute shortness of breath and found to have volume overload.   1. Acute on chronic diastolic HF: Reports hx of the same. Is suppose to be on daily lasix 50m daily but has not been taking as it has made things difficult for him with having to urinate freq while he is out. Given IV lasix with good response, and actually feeling much better today. Would continue with IV lasix today, probably transition to oral dosing in the am.  2. Severe AS: being followed by Dr. SMarlou Porchfor the same as an outpatient. He has had a brief discussion of TAVR at last visit but wanted to wait as his symptoms were not bothersome. Planned for repeat echo in a few weeks with follow up in the office. Seems he would be a good candidate for TAVR. Improved since admission. Suspect ok to wait until his follow up appt, but consider input referral.   3. CAD [place]/p CABG ('99): No anginal symptoms. Troponin with flat low trend in the setting of acute CHF. Continue with medical therapy.   4.  HTN: on BB, ARB, and norvasc  5. HL: on statin  6. SSS [place]/p PPM: followed by Dr. TLovena Le   7. PAF: Hx of post op Afib. On Eliquis  8. Hypokalemia: replaced   SBarnet Pall NP-C Pager [phone]/12/2017, 1:35 PM   The patient was seen, examined and discussed with LReino Bellis NP-C and I agree with the above.   84year old male with past medical history of CAD [place]/p CABG, severe AS, SSS [place]/p PPM placed 7/11, HTN, HL, anemia, and CVA.  He is followed by Dr. SMarlou Porchas an outpatient.  He underwent bypass in 1999 with no recurrence of angina.  He did have episode of postoperative atrial fibrillation with no further recurrence.  He has not been on anticoagulation, and did not want to start treatment for last discussion with Dr. SMarlou Porch  He underwent a pacemaker insertion in 2011 after being diagnosed with sick sinus syndrome, and is followed by Dr. TLovena Le  He is known to have severe aortic stenosis which has been followed by echoes recently in the office.  Did have a recent discussion with Dr. SMarlou Porchabout TAVR, but he opted to wait until symptoms became more bothersome.  [place]  and goes to the [facility] regularly.  He is very independent, and walks with a cane.  He was admitted with acute diastolic CHF valvular in etiology, BNP on admission 1100, he was started on IV Lasix with good response, however on physical exam still crackles in his lung bases, normal creatinine,  Low potassium.  He states that he already feels better. I would increase Lasix to 40 mg IV twice daily and give additional dose tonight and another in the morning.  Plan would be to walk with physical therapy and anticipated discharge tomorrow. Baseline weight 185 lbs, today 193 lbs. Long-term plan would be treatment for severe aortic stenosis, he previously was an open to TAVR as he was almost asymptomatic however now he presents with worsening dyspnea on exertion, new  CHF, also physical exam suggesting severe aortic stenosis with 5 out of 6 holosystolic murmur and missing S2. He would be a great candidate given great functional capacity, no significant comorbidities and normal kidney function. We will consult structural heart team that we will see him while in the hospital tomorrow.  Ena Dawley, MD 04/03/2018

## 2018-04-04 ENCOUNTER — Inpatient Hospital Stay (HOSPITAL_COMMUNITY): Payer: Medicare Other

## 2018-04-04 ENCOUNTER — Encounter (HOSPITAL_COMMUNITY): Payer: Self-pay | Admitting: Physician Assistant

## 2018-04-04 DIAGNOSIS — I350 Nonrheumatic aortic (valve) stenosis: Secondary | ICD-10-CM

## 2018-04-04 DIAGNOSIS — I6523 Occlusion and stenosis of bilateral carotid arteries: Secondary | ICD-10-CM

## 2018-04-04 LAB — CBC
HCT: 39.6 % (ref 39.0–52.0)
Hemoglobin: 13 g/dL (ref 13.0–17.0)
MCH: 30.7 pg (ref 26.0–34.0)
MCHC: 32.8 g/dL (ref 30.0–36.0)
MCV: 93.4 fL (ref 78.0–100.0)
PLATELETS: 94 10*3/uL — AB (ref 150–400)
RBC: 4.24 MIL/uL (ref 4.22–5.81)
RDW: 13.9 % (ref 11.5–15.5)
WBC: 3.9 10*3/uL — AB (ref 4.0–10.5)

## 2018-04-04 LAB — PULMONARY FUNCTION TEST
DL/VA % PRED: 80 %
DL/VA: 3.7 ml/min/mmHg/L
DLCO UNC: 13.92 ml/min/mmHg
DLCO cor % pred: 43 %
DLCO cor: 14.63 ml/min/mmHg
DLCO unc % pred: 41 %
FEF 25-75 Post: 0.94 L/sec
FEF 25-75 Pre: 0.87 L/sec
FEF2575-%CHANGE-POST: 8 %
FEF2575-%Pred-Post: 65 %
FEF2575-%Pred-Pre: 60 %
FEV1-%Change-Post: 2 %
FEV1-%Pred-Post: 61 %
FEV1-%Pred-Pre: 59 %
FEV1-PRE: 1.46 L
FEV1-Post: 1.5 L
FEV1FVC-%Change-Post: 8 %
FEV1FVC-%Pred-Pre: 100 %
FEV6-%CHANGE-POST: -3 %
FEV6-%PRED-POST: 60 %
FEV6-%Pred-Pre: 62 %
FEV6-PRE: 2.07 L
FEV6-Post: 1.99 L
FEV6FVC-%CHANGE-POST: 1 %
FEV6FVC-%PRED-PRE: 106 %
FEV6FVC-%Pred-Post: 108 %
FVC-%CHANGE-POST: -5 %
FVC-%PRED-POST: 55 %
FVC-%Pred-Pre: 58 %
FVC-POST: 1.99 L
FVC-Pre: 2.11 L
POST FEV1/FVC RATIO: 75 %
PRE FEV1/FVC RATIO: 69 %
Post FEV6/FVC ratio: 100 %
Pre FEV6/FVC Ratio: 98 %
RV % PRED: 132 %
RV: 3.96 L
TLC % pred: 82 %
TLC: 6.01 L

## 2018-04-04 LAB — BASIC METABOLIC PANEL WITH GFR
Anion gap: 12 (ref 5–15)
BUN: 23 mg/dL (ref 8–23)
CO2: 25 mmol/L (ref 22–32)
Calcium: 8.7 mg/dL — ABNORMAL LOW (ref 8.9–10.3)
Chloride: 100 mmol/L (ref 98–111)
Creatinine, Ser: 0.97 mg/dL (ref 0.61–1.24)
GFR calc Af Amer: >60 mL/min
GFR calc non Af Amer: >60 mL/min
Glucose, Bld: 117 mg/dL — ABNORMAL HIGH (ref 70–99)
Potassium: 3.4 mmol/L — ABNORMAL LOW (ref 3.5–5.1)
Sodium: 137 mmol/L (ref 135–145)

## 2018-04-04 LAB — APTT
APTT: 44 s — AB (ref 24–36)
aPTT: 64 seconds — ABNORMAL HIGH (ref 24–36)

## 2018-04-04 LAB — HEPARIN LEVEL (UNFRACTIONATED): Heparin Unfractionated: 2.08 IU/mL — ABNORMAL HIGH (ref 0.30–0.70)

## 2018-04-04 MED ORDER — FUROSEMIDE 40 MG PO TABS: 60.0000 mg | ORAL_TABLET | Freq: Every day | ORAL | Status: DC

## 2018-04-04 MED ORDER — HEPARIN (PORCINE) IN NACL 100-0.45 UNIT/ML-% IJ SOLN
1200.0000 [IU]/h | INTRAMUSCULAR | Status: DC
Start: 2018-04-04 — End: 2018-04-05
  Administered 2018-04-04: 1200 [IU]/h via INTRAVENOUS

## 2018-04-04 MED ORDER — HEPARIN (PORCINE) IN NACL 100-0.45 UNIT/ML-% IJ SOLN
1050.0000 [IU]/h | INTRAMUSCULAR | Status: DC
  Administered 2018-04-04: 1050 [IU]/h via INTRAVENOUS
  Filled 2018-04-04: qty 250

## 2018-04-04 MED ORDER — ALBUTEROL SULFATE (2.5 MG/3ML) 0.083% IN NEBU
2.5000 mg | INHALATION_SOLUTION | Freq: Once | RESPIRATORY_TRACT | Status: AC
Start: 2018-04-04 — End: 2018-04-04
  Administered 2018-04-04: 2.5 mg via RESPIRATORY_TRACT

## 2018-04-04 MED ORDER — POTASSIUM CHLORIDE CRYS ER 20 MEQ PO TBCR
40.0000 meq | EXTENDED_RELEASE_TABLET | Freq: Once | ORAL | Status: AC
  Administered 2018-04-04: 40 meq via ORAL
  Filled 2018-04-04: qty 2

## 2018-04-04 MED ORDER — SODIUM CHLORIDE 0.9% FLUSH: 3.0000 mL | Freq: Two times a day (BID) | INTRAVENOUS | Status: DC

## 2018-04-04 MED ORDER — ASPIRIN 81 MG PO CHEW
81.0000 mg | CHEWABLE_TABLET | ORAL | Status: AC
  Administered 2018-04-05: 81 mg via ORAL
  Filled 2018-04-04: qty 1

## 2018-04-04 MED ORDER — AMLODIPINE BESYLATE 10 MG PO TABS
10.0000 mg | ORAL_TABLET | Freq: Every day | ORAL | Status: DC
  Administered 2018-04-05 – 2018-04-07 (×3): 10 mg via ORAL
  Filled 2018-04-04 (×4): qty 1

## 2018-04-04 MED ORDER — SODIUM CHLORIDE 0.9% FLUSH: 3.0000 mL | INTRAVENOUS | Status: DC | PRN

## 2018-04-04 MED ORDER — SODIUM CHLORIDE 0.9 % IV SOLN: 250.0000 mL | INTRAVENOUS | Status: DC | PRN

## 2018-04-04 MED ORDER — IOPAMIDOL (ISOVUE-370) INJECTION 76%
100.0000 mL | Freq: Once | INTRAVENOUS | Status: AC | PRN
  Administered 2018-04-04: 100 mL via INTRAVENOUS

## 2018-04-04 MED ORDER — SODIUM CHLORIDE 0.9 % IV SOLN: INTRAVENOUS | Status: DC

## 2018-04-04 MED ORDER — AMLODIPINE BESYLATE 10 MG PO TABS: 10.0000 mg | ORAL_TABLET | Freq: Every day | ORAL | Status: DC

## 2018-04-04 MED ORDER — METOPROLOL TARTRATE 50 MG PO TABS
50.0000 mg | ORAL_TABLET | Freq: Once | ORAL | Status: AC
  Administered 2018-04-04: 50 mg via ORAL
  Filled 2018-04-04: qty 1

## 2018-04-04 MED ORDER — LOSARTAN POTASSIUM 50 MG PO TABS: 100.0000 mg | ORAL_TABLET | Freq: Every day | ORAL | Status: DC

## 2018-04-04 MED ORDER — AMLODIPINE BESYLATE 5 MG PO TABS: 5.0000 mg | ORAL_TABLET | Freq: Once | ORAL | Status: DC

## 2018-04-04 NOTE — Progress Notes (Signed)
ANTICOAGULATION CONSULT NOTE - Initial Consult  Pharmacy Consult for Heparin Indication: [place] while Apixaban on hold for hx Afib/CVA  Allergies  Allergen Reactions  . No Known Allergies     Patient Measurements: Height: 5\' 11"  (180.3 cm) Weight: 188 lb 7.9 oz (85.5 kg) IBW/kg (Calculated) : 75.3 Heparin Dosing Weight: 85.5 kg  Vital Signs: Temp: 98.8 F (37.1 C) (08/06 0552) Temp Source: Oral (08/06 0552) BP: 166/93 (08/06 0552) Pulse Rate: 93 (08/06 0552)  Labs: Recent Labs    04/02/18 1926 04/03/18 0459 04/04/18 0419  HGB 14.1 12.1* 13.0  HCT 44.1 37.3* 39.6  PLT 113* 92* 94*  LABPROT 15.2  --   --   INR 1.21  --   --   CREATININE 1.05 0.98 0.97  TROPONINI 0.03*  --   --     Estimated Creatinine Clearance: 51.8 mL/min (by C-G formula based on SCr of 0.97 mg/dL).   Medical History: Past Medical History:  Diagnosis Date  . BPH (benign prostatic hyperplasia)   . CAD (coronary artery disease)    3 vessel CAD, s/p CABG 04/1998  . Carotid artery disease ([place])   . Dyslipidemia   . Essential hypertension   . GAD (generalized anxiety disorder)   . Intestinal infection due to Clostridium difficile 03/2010  . PAF (paroxysmal atrial fibrillation) (HCC)    Hx of post op PAF  . Presence of permanent cardiac pacemaker   . PVD (peripheral vascular disease) ([place])   . Severe aortic stenosis   . Ulcerative colitis     Assessment: 59 YOM who presented on 8/4 with SOB. Cardiology has been consulted and appears to be planning a cardiac cath for evaluation. The patient was on apixaban PTA for hx Afib/CVA - pharmacy consulted to transition to Heparin.  Last Apixaban dose on 8/5 @ 2200. Will obtain aPTT/HL values however will likely use aPTT values moving forward. Will initiate Heparin drip without a bolus ~12 hours after the last dose of apixaban. Hgb/Hct wnl, plts 94 - will watch.  Goal of Therapy:  Heparin level 0.3-0.7 units/ml aPTT 66-102 seconds Monitor platelets by  anticoagulation protocol: Yes   Plan:  - Start Heparin at 1050 units/hr (10.5 ml/hr) - Obtain baseline APTT and heparin level - Daily aPTT and HL until correlated - Will continue to monitor for any signs/symptoms of bleeding and will follow up with aPTT in 8 hours   Thank you for allowing pharmacy to be a part of this patient's care.  Alycia Rossetti, PharmD, BCPS Clinical Pharmacist Pager: [phone] Clinical phone for 04/04/2018 from 7a-3:30p: [phone] If after 3:30p, please call main pharmacy at: [phone] Please check AMION for all [facility] numbers 04/04/2018 8:40 AM

## 2018-04-04 NOTE — Progress Notes (Signed)
PROGRESS NOTE    Dalton Townsend  EQA:[id number] DOB: 1922-09-13 DOA: 04/02/2018 PCP: Lajean Manes, MD   Brief Narrative: 84 year old man with medical problems including severe aortic stenosis, diastolic heart failure, atrial fibrillation on anticoagulation with DOAC, coronary artery disease status post CABG, ulcerative colitis under good control who presents with shortness of breath.  History is obtained via discussion with the patient, family at bedside, as well as chart review. The patient lives with his spouse in [place], he goes the [employer] and performs aerobic activity for about 60 minutes, he performs this 4 times weekly. He is independent in his ADLs and IADLs, he walks a single pronged cane. 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  He reports that he experienced acute shortness of breath that occurred at 4 PM on the afternoon of admission. He reports he was taking a nap and when he ambulated to urinate that dyspnea was severe. He reports gaining 5 pounds in the past year, denies any noticeable weight gain over the past few weeks. He denies chest pain, palpitations, dizziness, orthopnea, nausea, vomiting, syncope. He reports that he is supposed to take Lasix 40 mg at least daily, he stopped taking the Lasix on the Friday prior to admission-he cites that he can't go anywhere when he takes the medication due to frequent urination. Due to his dyspnea, EMS was called by the emergency room. The patient reports in the past she is always reported minimal/her no symptoms related to his aortic stenosis. The patient reports that he has been able to exercise on his normal routine at the [facility] without dyspnea on exertion. However, he does report of the past few weeks when doing chores around the house having some mild dyspnea on exertion.  ED  Course:in the emergency room vital signs remarkable for tachycardia with a heart rate between 110 and 120, O2 sat nadir of 87% requiring 2-4 L nasal cane oxygen to maintain O2 saturations above 92%, respiratory rate 97L, systolic blood pressure ranging from 150-180. Diagnostics remarkable for CBC with mild thrombocytopenia with platelet count of 113,000. BMP with creatinine 1.05. BNP of 1400. Chest x-ray revealed increasing interstitial prominence seen with atypical infection or poor edema with right lung base consolidation, trace pleural effusions, stable cardiac megaly. Troponin within normal limits at 0.03. Emergency medicine team give the patient 40 mg of IV Lasix, Hospital medicine consult for further management    Assessment & Plan:   Active Problems:   HTN (hypertension)   A-fib (HCC)   Acute on chronic congestive heart failure (HCC)    1] acute hypoxic respiratory failure secondary to diastolic heart failure with normal ejection fraction/Severe aortic stenosis/afib -patient admitted with worsening dyspnea on exertion due to noncompliance with furosemide for 3 days prior to admission to the hospital.  He said he stopped taking them because of frequent urination.  Upon admission his BNP was 1400 chest his chest x-ray was consistent with pulmonary edema he was placed back on Lasix 40 mg IV daily.  His symptoms improved within 2 days.  A repeat echocardiogram this admission shows severe aortic stenosis with a peak gradient of 88 mmHg and transvalvular gradient of 53 mmHg. Cardiology is following him and is planning for right and left cardiac cath tomorrow.  They have also ordered gated cardiac CTA and CTA of the chest  abdomen and pelvis to evaluate cardiac anatomy and peripheral vasculature.  They have also ordered carotid ultrasound.  Patient was on Eliquis as an outpatient which has been stopped and heparin has been started today in preparation for procedure.  I have placed an order for PFTs  to be done today.  Cozaar and Lasix are being on hold in preparation for CT scan and procedure tomorrow.  Monitor renal functions.  Patient's daughter is a Software engineer who works for [employer].  2] depression continue Lexapro  3] BPH continue Proscar  4] pancytopenia monitor on heparin   5]hypokalemia due to diuretics and replete and recheck.    DVT prophylaxis: Heparin Code Status: Full code Family Communication: Discussed with patient's wife and daughter Disposition Plan: Patient to have cardiac cath tomorrow to decide on TAVR.   Consultants: Cardiology   Procedures: None Antimicrobials none  Subjective: Feels well walked yesterday without being short of breath off of oxygen.  Objective: Vitals:   04/03/18 2105 04/04/18 0552 04/04/18 0700 04/04/18 1058  BP: (!) 142/81 (!) 166/93  104/67  Pulse: 78 93  64  Resp: 20 20    Temp: 98.2 F (36.8 C) 98.8 F (37.1 C)    TempSrc: Oral Oral    SpO2: 93% 93%    Weight:   85.5 kg (188 lb 7.9 oz)   Height:        Intake/Output Summary (Last 24 hours) at 04/04/2018 1407 Last data filed at 04/04/2018 0700 Gross per 24 hour  Intake 480 ml  Output 2100 ml  Net -1620 ml   Filed Weights   04/03/18 0135 04/04/18 0700  Weight: 87.9 kg (193 lb 12.6 oz) 85.5 kg (188 lb 7.9 oz)    Examination:  General exam: Appears calm and comfortable  Respiratory system: Clear to auscultation. Respiratory effort normal. Cardiovascular system: S1 & S2 heard, RRR. No JVD, murmurs, rubs, gallops or clicks. No pedal edema. Gastrointestinal system: Abdomen is nondistended, soft and nontender. No organomegaly or masses felt. Normal bowel sounds heard. Central nervous system: Alert and oriented. No focal neurological deficits. Extremities: Symmetric 5 x 5 power. Skin: No rashes, lesions or ulcers Psychiatry: Judgement and insight appear normal. Mood & affect appropriate.     Data Reviewed: I have personally reviewed following labs and imaging  studies  CBC: Recent Labs  Lab 04/02/18 1926 04/03/18 0459 04/04/18 0419  WBC 6.4 3.6* 3.9*  HGB 14.1 12.1* 13.0  HCT 44.1 37.3* 39.6  MCV 95.5 94.0 93.4  PLT 113* 92* 94*   Basic Metabolic Panel: Recent Labs  Lab 04/02/18 1926 04/03/18 0459 04/04/18 0419  NA 132* 138 137  K 3.8 3.3* 3.4*  CL 96* 104 100  CO2 23 25 25   GLUCOSE 142* 112* 117*  BUN 20 21 23   CREATININE 1.05 0.98 0.97  CALCIUM 9.1 8.8* 8.7*   GFR: Estimated Creatinine Clearance: 51.8 mL/min (by C-G formula based on SCr of 0.97 mg/dL). Liver Function Tests: No results for input(s): AST, ALT, ALKPHOS, BILITOT, PROT, ALBUMIN in the last 168 hours. No results for input(s): LIPASE, AMYLASE in the last 168 hours. No results for input(s): AMMONIA in the last 168 hours. Coagulation Profile: Recent Labs  Lab 04/02/18 1926  INR 1.21   Cardiac Enzymes: Recent Labs  Lab 04/02/18 1926  TROPONINI 0.03*   BNP (last 3 results) No results for input(s): PROBNP in the last 8760 hours. HbA1C: No results for input(s): HGBA1C in the last 72 hours. CBG: No results for input(s): GLUCAP  in the last 168 hours. Lipid Profile: No results for input(s): CHOL, HDL, LDLCALC, TRIG, CHOLHDL, LDLDIRECT in the last 72 hours. Thyroid Function Tests: No results for input(s): TSH, T4TOTAL, FREET4, T3FREE, THYROIDAB in the last 72 hours. Anemia Panel: No results for input(s): VITAMINB12, FOLATE, FERRITIN, TIBC, IRON, RETICCTPCT in the last 72 hours. Sepsis Labs: No results for input(s): PROCALCITON, LATICACIDVEN in the last 168 hours.  No results found for this or any previous visit (from the past 240 hour(s)).       Radiology Studies: Dg Chest Port 1 View  Result Date: 04/02/2018 CLINICAL DATA:  Shortness of breath, wheezing. EXAM: PORTABLE CHEST 1 VIEW COMPARISON:  Chest radiograph March 19, 2017 FINDINGS: Cardiac silhouette is mildly enlarged unchanged. Calcified aortic arch. Status post median sternotomy for CABG.  Increased interstitial prominence. Trace pleural effusions. Patchy RIGHT lung base airspace opacity. Bandlike density LEFT lung base. RIGHT mid lung zone scarring. No pneumothorax. LEFT cardiac pacemaker in situ. Osteopenia. IMPRESSION: Increasing interstitial prominence seen with atypical infection or pulmonary edema with RIGHT lung base consolidation. Trace pleural effusions. LEFT lung base atelectasis/scarring. Stable cardiomegaly. Aortic Atherosclerosis (ICD10-I70.0). Electronically Signed   By: Elon Alas M.D.   On: 04/02/2018 19:42        Scheduled Meds: . amLODipine  10 mg Oral Daily  . atorvastatin  20 mg Oral Daily  . balsalazide  2,250 mg Oral TID WC  . escitalopram  10 mg Oral Daily  . finasteride  5 mg Oral Daily  . latanoprost  1 drop Both Eyes QHS  . [START ON 04/06/2018] losartan  100 mg Oral Daily  . mouth rinse  15 mL Mouth Rinse BID  . metoprolol succinate  50 mg Oral Daily  . sodium chloride flush  3 mL Intravenous Q12H   Continuous Infusions: . heparin 1,050 Units/hr (04/04/18 1117)     LOS: 2 days      Georgette Shell, MD [facility]  If 7PM-7AM, please contact night-coverage [email or web address] Password [email or web address] 04/04/2018, 2:07 PM

## 2018-04-04 NOTE — Evaluation (Signed)
Physical Therapy Evaluation Patient Details Name: Dalton Townsend MRN: [id number] DOB: 1922/12/23 Today's Date: 04/04/2018   History of Present Illness  84 yo male with PMH of CAD s/p CABG, severe AS, SSS s/p PPM placed 7/11, HTN, HL, anemia, and CVA who presented with acute shortness of breath and found to have volume overload.   Clinical Impression  PTA pt drives himself to [facility] 5x/wk and participates in aerobic exercise. Pt independent in all ADLs and ambulates with cane. Pt is currently limited in his safe mobility by decreased strength and endurance. Pt requires mod I for bed mobility, min guard for transfers and ambulation of 200 feet with RW. PT will continue to follow acutely to progress mobility back to PLOF. PT recommends d/c home without PT follow up as pt will return to his exercise program at the [facility].     Follow Up Recommendations No PT follow up;Supervision for mobility/OOB    Equipment Recommendations  None recommended by PT    Recommendations for Other Services       Precautions / Restrictions Precautions Precautions: None Restrictions Weight Bearing Restrictions: No      Mobility  Bed Mobility Overal bed mobility: Modified Independent             General bed mobility comments: increased effort to bring LE into bed  Transfers Overall transfer level: Needs assistance Equipment used: Rolling walker (2 wheeled) Transfers: Sit to/from Stand Sit to Stand: Min guard         General transfer comment: min guard to power up and steady with RW, decreased eccentric control with sitting EoB after ambulation   Ambulation/Gait Ambulation/Gait assistance: Min guard Gait Distance (Feet): 200 Feet Assistive device: Rolling walker (2 wheeled) Gait Pattern/deviations: Shuffle;Decreased stride length;Step-through pattern;Trunk flexed Gait velocity: slowed Gait velocity interpretation: 1.31 - 2.62 ft/sec, indicative of limited community ambulator General Gait  Details: hands on min guard for safety, slow, steady gait vc for looking up and out with gait        Balance Overall balance assessment: No apparent balance deficits (not formally assessed)                                           Pertinent Vitals/Pain Pain Assessment: No/denies pain    Home Living Family/patient expects to be discharged to:: Private residence Living Arrangements: Spouse/significant other Available Help at Discharge: Family;Available 24 hours/day Type of Home: House Home Access: Stairs to enter Entrance Stairs-Rails: Right Entrance Stairs-Number of Steps: 1 Home Layout: One level;Able to live on main level with bedroom/bathroom;Laundry or work area in [place]: Environmental consultant - 2 wheels;Bedside commode;Grab bars - tub/shower;Cane - single point      Prior Function Level of Independence: Independent with assistive device(s)         Comments: uses SPC     Hand Dominance   Dominant Hand: (ambidexterous)    Extremity/Trunk Assessment   Upper Extremity Assessment Upper Extremity Assessment: Overall WFL for tasks assessed    Lower Extremity Assessment Lower Extremity Assessment: Overall WFL for tasks assessed    Cervical / Trunk Assessment Cervical / Trunk Assessment: Kyphotic  Communication   Communication: No difficulties  Cognition Arousal/Alertness: Awake/alert Behavior During Therapy: WFL for tasks assessed/performed Overall Cognitive Status: Within Functional Limits for tasks assessed  General Comments General comments (skin integrity, edema, etc.): Daughter and wife present at beginning of session.  SaO2 with ambulation >92%O2, max HR with ambulation 90 bpm        Assessment/Plan    PT Assessment Patient needs continued PT services  PT Problem List Decreased activity tolerance;Decreased strength;Cardiopulmonary status limiting activity       PT  Treatment Interventions DME instruction;Gait training;Stair training;Functional mobility training;Therapeutic activities;Therapeutic exercise;Balance training;Cognitive remediation;Patient/family education    PT Goals (Current goals can be found in the Care Plan section)  Acute Rehab PT Goals Patient Stated Goal: go home soon PT Goal Formulation: With patient Time For Goal Achievement: 04/18/18 Potential to Achieve Goals: Good    Frequency Min 3X/week    AM-PAC PT "6 Clicks" Daily Activity  Outcome Measure Difficulty turning over in bed (including adjusting bedclothes, sheets and blankets)?: A Little Difficulty moving from lying on back to sitting on the side of the bed? : A Lot Difficulty sitting down on and standing up from a chair with arms (e.g., wheelchair, bedside commode, etc,.)?: Unable Help needed moving to and from a bed to chair (including a wheelchair)?: A Little Help needed walking in hospital room?: A Little Help needed climbing 3-5 steps with a railing? : A Lot 6 Click Score: 14    End of Session Equipment Utilized During Treatment: Gait belt Activity Tolerance: Patient tolerated treatment well Patient left: in bed;with call bell/phone within reach;with family/visitor present Nurse Communication: Mobility status PT Visit Diagnosis: Muscle weakness (generalized) (M62.81);Other abnormalities of gait and mobility (R26.89)    Time: 3734-2876 PT Time Calculation (min) (ACUTE ONLY): 32 min   Charges:   PT Evaluation $PT Eval Moderate Complexity: 1 Mod PT Treatments $Gait Training: 8-22 mins        Kelvin Sennett B. Migdalia Dk PT, DPT Acute Rehabilitation  [phone] Pager [phone]    [place] 04/04/2018, 11:28 AM

## 2018-04-04 NOTE — Consult Note (Signed)
[facility]  MULTIDISCIPLINARY HEART VALVE TEAM  CARDIOTHORACIC SURGERY CONSULTATION REPORT  Primary Cardiologist: Dr. Candee Furbish PCP is Lajean Manes, MD  Chief Complaint  Patient presents with  . Critical aortic stenosis    HPI:  The patient is a 84 year old gentleman with a history of hypertension, paroxysmal atrial fibrillation on Eliquis, dyslipidemia, cerebrovascular disease status post CVA in 2001 followed by right carotid endarterectomy, complete heart block status post permanent pacemaker placement in 2006, coronary disease status post coronary bypass graft surgery by Dr. Roxan Hockey in 1989, and known severe aortic stenosis.  This has been followed by Dr. Marlou Porch.  An echocardiogram in 07/2016 showed severe aortic stenosis with a mean gradient of 47 mmHg.  The patient was not having any symptoms at that time refused further work-up.  He had a repeat echo in 05/2017 which showed no significant change in the mean gradient of 41 mmHg with normal left ventricular systolic function.  The patient remained asymptomatic.  Over the past year or so he has noted some exertional shortness of breath and fatigue but has not significantly affected his activity level.  He said that he was going to the gym daily and working on aerobic exercise machines such as a recumbent bicycle.  He had another echocardiogram on 12/20/2017 which showed no significant change in the severe aortic stenosis with a mean gradient of 40 mmHg and normal left ventricular ejection fraction of 55 to 60%.  Then last Sunday he noticed worsening shortness of breath after he woke up from a nap and could not walk to the bathroom without significant shortness of breath.  His family called EMS and in the emergency department his troponin was 0.03.  Chest x-ray showed pulmonary edema.  A repeat echocardiogram showed critical aortic stenosis with a mean gradient of 53 mmHg and a peak gradient of 88 mmHg.  The dimensionless  index was 0.26 with a calculated valve area of 0.73 cm.  Left ventricular systolic function remain normal.  He improved with intravenous diuresis.  He had been on Lasix as an outpatient but said that he does not take it very often due to having to stay close to the bathroom.  He denies any orthopnea or PND.  He has had no dizziness or syncope.  He denies any chest pain or pressure.  He has had some peripheral edema.  2 of his daughters are here with him today and 1 of them is a Nurse, adult at [employer].  Past Medical History:  Diagnosis Date  . BPH (benign prostatic hyperplasia)   . CAD (coronary artery disease)    3 vessel CAD, s/p CABG 04/1998  . Carotid artery disease ([place])    a. s/p R CEA   . Dyslipidemia   . Essential hypertension   . GAD (generalized anxiety disorder)   . Intestinal infection due to Clostridium difficile 03/2010  . PAF (paroxysmal atrial fibrillation) ([facility])    a. on Eliquis  . Presence of permanent cardiac pacemaker   . PVD (peripheral vascular disease) ([place])   . Severe aortic stenosis   . Ulcerative colitis     Past Surgical History:  Procedure Laterality Date  . CARDIAC CATHETERIZATION  1999  . CAROTID ENDARTERECTOMY Right 2004  . CATARACT EXTRACTION, BILATERAL  2011  . CORONARY ARTERY BYPASS GRAFT  04/1998   3 vessel CAD  . INCISION AND DRAINAGE OF WOUND Left 05/04/2016   Procedure: SURGICAL PREP FOR GRAFTING LEFT LEG WITH THERA SKIN APPLICATION;  Surgeon: Irene Limbo, MD;  Location: [facility];  Service: Plastics;  Laterality: Left;  . INTRAOPERATIVE ARTERIOGRAM  06/2000  . PACEMAKER INSERTION  03/25/2010  . TOTAL KNEE ARTHROPLASTY  07/16/09  . TRANSURETHRAL RESECTION OF PROSTATE  1998    Family History  Problem Relation Age of Onset  . Congestive Heart Failure Mother   . Leukemia Father   . Leukemia Unknown   . Heart failure Unknown   . Cancer Unknown     Social History   Socioeconomic History  . Marital status: Married    Spouse name:  Not on file  . Number of children: 3  . Years of education: Not on file  . Highest education level: Not on file  Occupational History  . Occupation: retired  Scientific laboratory technician  . Financial resource strain: Not on file  . Food insecurity:    Worry: Not on file    Inability: Not on file  . Transportation needs:    Medical: Not on file    Non-medical: Not on file  Tobacco Use  . Smoking status: Former Smoker    Years: 10.00    Types: Cigarettes    Last attempt to quit: 08/30/1952    Years since quitting: 65.6  . Smokeless tobacco: Former Network engineer and Sexual Activity  . Alcohol use: No  . Drug use: No  . Sexual activity: Not on file  Lifestyle  . Physical activity:    Days per week: Not on file    Minutes per session: Not on file  . Stress: Not on file  Relationships  . Social connections:    Talks on phone: Not on file    Gets together: Not on file    Attends religious service: Not on file    Active member of club or organization: Not on file    Attends meetings of clubs or organizations: Not on file    Relationship status: Not on file  . Intimate partner violence:    Fear of current or ex partner: Not on file    Emotionally abused: Not on file    Physically abused: Not on file    Forced sexual activity: Not on file  Other Topics Concern  . Not on file  Social History Narrative  . Not on file    Current Facility-Administered Medications  Medication Dose Route Frequency Provider Last Rate Last Dose  . amLODipine (NORVASC) tablet 10 mg  10 mg Oral Daily Eileen Stanford, PA-C      . atorvastatin (LIPITOR) tablet 20 mg  20 mg Oral Daily Vilma Prader, MD   20 mg at 04/04/18 0931  . balsalazide (COLAZAL) capsule 2,250 mg  2,250 mg Oral TID WC Vilma Prader, MD   2,250 mg at 04/04/18 0932  . escitalopram (LEXAPRO) tablet 10 mg  10 mg Oral Daily Vilma Prader, MD   10 mg at 04/04/18 0931  . finasteride (PROSCAR) tablet 5 mg  5 mg Oral Daily Vilma Prader, MD   5 mg at 04/04/18 0931  . heparin ADULT infusion 100 units/mL (25000 units/26m sodium chloride 0.45%)  1,050 Units/hr Intravenous Continuous MRolla Flatten RPH 10.5 mL/hr at 04/04/18 1117 1,050 Units/hr at 04/04/18 1117  . latanoprost (XALATAN) 0.005 % ophthalmic solution 1 drop  1 drop Both Eyes QHS KVilma Prader MD   1 drop at 04/03/18 2158  . [START ON 04/06/2018] losartan (COZAAR) tablet 100 mg  100 mg Oral Daily TAngelena Form  R, PA-C      . MEDLINE mouth rinse  15 mL Mouth Rinse BID Georgette Shell, MD   15 mL at 04/04/18 0933  . metoprolol succinate (TOPROL-XL) 24 hr tablet 50 mg  50 mg Oral Daily Vilma Prader, MD   50 mg at 04/04/18 0931  . sodium chloride flush (NS) 0.9 % injection 3 mL  3 mL Intravenous Q12H Vilma Prader, MD   3 mL at 04/04/18 0934    Allergies  Allergen Reactions  . No Known Allergies       Review of Systems:   General:  normal appetite, reduced energy, no weight gain, no weight loss, no fever  Cardiac:  no chest pain with exertion, no chest pain at rest, +SOB with  exertion, no resting SOB, no PND, no orthopnea, no palpitations, + arrhythmia, + atrial fibrillation, occasional LE edema, no dizzy spells, no syncope  Respiratory:  exertional shortness of breath, no home oxygen, no productive cough, no dry cough, no bronchitis, no wheezing, no hemoptysis, no asthma, no pain with inspiration or cough, no sleep apnea, no CPAP at night  GI:   no difficulty swallowing, no reflux, no frequent heartburn, no hiatal hernia, no abdominal pain, no constipation, no diarrhea, no hematochezia, no hematemesis, no melena  GU:   no dysuria,  no frequency, no urinary tract infection,  hematuria, no enlarged prostate, no kidney stones, no kidney disease  Vascular:  no pain suggestive of claudication, no pain in feet, no leg cramps, no varicose veins, no DVT, no non-healing foot ulcer  Neuro:   remote stroke, no TIA's, no seizures, no  headaches, no temporary blindness one eye,  no slurred speech, no peripheral neuropathy, no chronic pain, mild instability of gait, no memory/cognitive dysfunction  Musculoskeletal: no arthritis, no joint swelling, no myalgias, no difficulty walking, normal mobility   Skin:   no rash, no itching, no skin infections, no pressure sores or ulcerations  Psych:   no anxiety, no depression, no nervousness, no unusual recent stress  Eyes:   no blurry vision, no floaters, no recent vision changes,  wears glasses or contacts  ENT:   no hearing loss, no loose or painful teeth, no dentures, last saw dentist this year  Hematologic:  no easy bruising, no abnormal bleeding, no clotting disorder, no frequent epistaxis  Endocrine:  no diabetes, does not check CBG's at home      Physical Exam:   BP 104/67   Pulse 64   Temp 98.8 F (37.1 C) (Oral)   Resp 20   Ht 5' 11"  (1.803 m)   Wt 85.5 kg (188 lb 7.9 oz)   SpO2 93%   BMI 26.29 kg/m   General:  Elderly but  well-appearing  HEENT:  Unremarkable, NCAT, PERLA, EOMI, oropharynx clear, teeth in good condition for age.  Neck:   no JVD, no bruits, no adenopathy or thyromegaly  Chest:   clear to auscultation, symmetrical breath sounds, no wheezes, no rhonchi, old sternotomy scar  CV:   RRR, grade IV/VI crescendo/decrescendo murmur heard best at RSB,  no diastolic murmur  Abdomen:  soft, non-tender, no masses or organomegaly  Extremities:  warm, well-perfused, pulses not palpable in feet, trace LE edema  Rectal/GU  Deferred  Neuro:   Grossly non-focal and symmetrical throughout  Skin:   Clean and dry, no rashes, no breakdown   Diagnostic Tests:      *[facility]*                   *  [address] [place], [place] [address]                            [phone]  ------------------------------------------------------------------- Transthoracic Echocardiography  Patient:     Dalton Townsend, Dalton Townsend MR #:       [id number] Study Date: 04/03/2018 Gender:     M Age:        81 Height:     180.3 cm Weight:     87.9 kg BSA:        2.11 m^2 Pt. Status: Room:       5M20C   PERFORMING   [facility], Inpatient  ATTENDING    [place], Md  ADMITTING    Adolm Joseph  REFERRING    Vilma Prader  SONOGRAPHER  Jannett Celestine, RDCS  cc:  ------------------------------------------------------------------- LV EF: 50% -   55%  ------------------------------------------------------------------- History:   PMH:  Murmur 785.2. No prior cardiac history.  Atrial fibrillation.  Coronary artery disease.  Congestive heart failure. Stroke.  Transient ischemic attack.  Risk factors:  AV block . PAF. CABG. Severe AS.  ------------------------------------------------------------------- Study Conclusions  - Left ventricle: Abnormal septal motion inferior basal hypokinesis   The cavity size was mildly dilated. Wall thickness was increased   in a pattern of moderate LVH. Systolic function was normal. The   estimated ejection fraction was in the range of 50% to 55%. Left   ventricular diastolic function parameters were normal. - Aortic valve: There was critical stenosis. There was mild   regurgitation. Valve area (VTI): 0.74 cm^2. Valve area (Vmax):   0.82 cm^2. Valve area (Vmean): 0.73 cm^2. - Mitral valve: Calcified annulus. Mildly thickened leaflets .   There was mild regurgitation. - Left atrium: The atrium was moderately dilated. - Right atrium: The atrium was mildly dilated. - Atrial septum: No defect or patent foramen ovale was identified.  ------------------------------------------------------------------- Labs, prior tests, procedures, and surgery: Coronary artery bypass grafting.  ------------------------------------------------------------------- Study data:  Comparison was made to the study of 12/20/2017.  Study status:   Routine.  Procedure:  The patient reported no pain pre or post test. Transthoracic echocardiography. Image quality was adequate. The study was technically difficult, as a result of restricted patient mobility.  Study completion:  There were no complications.          Transthoracic echocardiography.  M-mode, complete 2D, spectral Doppler, and color Doppler.  Birthdate: Patient birthdate: [birth year].  Age:  Patient is 84 yr old.  Sex: Gender: male.    BMI: 27 kg/m^2.  Blood pressure:     163/89 Patient status:  Inpatient.  Study date:  Study date: 04/03/2018. Study time: 03:45 PM.  Location:  Bedside.  -------------------------------------------------------------------  ------------------------------------------------------------------- Left ventricle:  Abnormal septal motion inferior basal hypokinesis The cavity size was mildly dilated. Wall thickness was increased in a pattern of moderate LVH. Systolic function was normal. The estimated ejection fraction was in the range of 50% to 55%. The transmitral flow pattern was normal. The deceleration time of the early transmitral flow velocity was normal. The pulmonary vein flow pattern was normal. The tissue Doppler  parameters were normal. Left ventricular diastolic function parameters were normal.  ------------------------------------------------------------------- Aortic valve:   Trileaflet; severely thickened, severely calcified leaflets.  Doppler:   There was critical stenosis.   There was mild regurgitation.    VTI ratio of LVOT to aortic valve: 0.26. Valve area (VTI): 0.74 cm^2. Indexed valve area (VTI): 0.35 cm^2/m^2. Peak velocity ratio of LVOT to aortic valve: 0.29. Valve area (Vmax): 0.82 cm^2. Indexed valve area (Vmax): 0.39 cm^2/m^2. Mean velocity ratio of LVOT to aortic valve: 0.26. Valve area (Vmean): 0.73 cm^2. Indexed valve area (Vmean): 0.34 cm^2/m^2.    Mean gradient (S): 53 mm Hg. Peak gradient (S): 88 mm  Hg.  ------------------------------------------------------------------- Mitral valve:   Calcified annulus. Mildly thickened leaflets . Doppler:  There was mild regurgitation.    Peak gradient (D): 4 mm Hg.  ------------------------------------------------------------------- Left atrium:  The atrium was moderately dilated.  ------------------------------------------------------------------- Atrial septum:  No defect or patent foramen ovale was identified.   ------------------------------------------------------------------- Right ventricle:  The cavity size was normal. Wall thickness was normal. Pacer wire or catheter noted in right ventricle. Systolic function was normal.  ------------------------------------------------------------------- Pulmonic valve:    Doppler:  There was mild regurgitation.  ------------------------------------------------------------------- Tricuspid valve:   Doppler:  There was mild regurgitation.  ------------------------------------------------------------------- Right atrium:  The atrium was mildly dilated. Pacer wire or catheter noted in right atrium.  ------------------------------------------------------------------- Pericardium:  The pericardium was normal in appearance.  ------------------------------------------------------------------- Systemic veins: Inferior vena cava: The vessel was dilated. The respirophasic diameter changes were blunted (< 50%), consistent with elevated central venous pressure.  ------------------------------------------------------------------- Measurements   Left ventricle                            Value          Reference  LV ID, ED, PLAX chordal                   45.8  mm       43 - 52  LV ID, ES, PLAX chordal                   30.8  mm       23 - 38  LV fx shortening, PLAX chordal            33    %        >=29  LV PW thickness, ED                       15    mm       ---------  IVS/LV PW ratio, ED                        0.97           <=1.3  Stroke volume, 2D                         73    ml       ---------  Stroke volume/bsa, 2D                     35    ml/m^2   ---------  LV e&', lateral                            7.94  cm/s     ---------  LV E/e&', lateral                          13.22          ---------  LV e&', medial                             5.44  cm/s     ---------  LV E/e&', medial                           19.3           ---------  LV e&', average                            6.69  cm/s     ---------  LV E/e&', average                          15.7           ---------    Ventricular septum                        Value          Reference  IVS thickness, ED                         14.5  mm       ---------    LVOT                                      Value          Reference  LVOT ID, S                                19    mm       ---------  LVOT area                                 2.84  cm^2     ---------  LVOT peak velocity, S                     136   cm/s     ---------  LVOT mean velocity, S                     88.6  cm/s     ---------  LVOT VTI, S                               25.6  cm       ---------  LVOT peak gradient, S                     7     mm Hg    ---------    Aortic valve  Value          Reference  Aortic valve peak velocity, S             470   cm/s     ---------  Aortic valve mean velocity, S             346   cm/s     ---------  Aortic valve VTI, S                       98.7  cm       ---------  Aortic mean gradient, S                   53    mm Hg    ---------  Aortic peak gradient, S                   88    mm Hg    ---------  VTI ratio, LVOT/AV                        0.26           ---------  Aortic valve area, VTI                    0.74  cm^2     ---------  Aortic valve area/bsa, VTI                0.35  cm^2/m^2 ---------  Velocity ratio, peak, LVOT/AV             0.29           ---------  Aortic valve area, peak  velocity          0.82  cm^2     ---------  Aortic valve area/bsa, peak               0.39  cm^2/m^2 ---------  velocity  Velocity ratio, mean, LVOT/AV             0.26           ---------  Aortic valve area, mean velocity          0.73  cm^2     ---------  Aortic valve area/bsa, mean               0.34  cm^2/m^2 ---------  velocity    Aorta                                     Value          Reference  Aortic root ID, ED                        34    mm       ---------    Left atrium                               Value          Reference  LA ID, A-P, ES                            46    mm       ---------  LA ID/bsa, A-P                            2.18  cm/m^2   <=2.2  LA volume, S                              78.2  ml       ---------  LA volume/bsa, S                          37    ml/m^2   ---------  LA volume, ES, 1-p A4C                    89.5  ml       ---------  LA volume/bsa, ES, 1-p A4C                42.4  ml/m^2   ---------  LA volume, ES, 1-p A2C                    64.9  ml       ---------  LA volume/bsa, ES, 1-p A2C                30.7  ml/m^2   ---------    Mitral valve                              Value          Reference  Mitral E-wave peak velocity               105   cm/s     ---------  Mitral A-wave peak velocity               100   cm/s     ---------  Mitral deceleration time          (H)     232   ms       150 - 230  Mitral peak gradient, D                   4     mm Hg    ---------  Mitral E/A ratio, peak                    1.1            ---------    Right ventricle                           Value          Reference  TAPSE                                     13.2  mm       ---------  RV s&', lateral, S                         10.4  cm/s     ---------  Legend: (L)  and  (H)  mark values outside specified reference range.  ------------------------------------------------------------------- Prepared and Electronically Authenticated by  Jenkins Rouge,  M.D. 2019-08-05T17:03:59   Impression:  This 84 year old gentleman has stage D, critical, symptomatic aortic stenosis with New York Heart Association class III symptoms of exertional fatigue and shortness of breath and was admitted with acute on chronic diastolic congestive heart failure with a BNP of 1143 and acute pulmonary edema on chest x-ray.  I have personally reviewed his 2D echocardiogram.  He has a trileaflet aortic valve with severely thickened and calcified leaflets with severely restricted mobility.  The mean gradient is 53 mmHg with a dimensionless index of 0.29 and a valve area of 0.74 cm consistent with critical aortic stenosis.  Left ventricular systolic function is preserved.  I agree that aortic valve replacement is indicated in this patient.  His operative risk for open surgical aortic valve replacement would be high due to his age and previous coronary bypass graft surgery as well as other comorbid factors.  I would not consider him a candidate for open surgery.  I think transcatheter aortic valve replacement would be a good alternative for him.  He had a gated cardiac CTA and a CTA of the chest, abdomen, and pelvis this morning but the final images are not available at this time.  He is scheduled for a right and left heart catheterization tomorrow.  We will make a final decision about transcatheter aortic valve replacement after the studies have been completed.  The patient and his 2 daughters were counseled at length regarding treatment alternatives for management of severe symptomatic aortic stenosis. The risks and benefits of surgical intervention has been discussed in detail. Long-term prognosis with medical therapy was discussed. Alternative approaches such as conventional surgical aortic valve replacement, transcatheter aortic valve replacement, and palliative medical therapy were compared and contrasted at length. This discussion was placed in the context of the patient's own  specific clinical presentation and past medical history. All of their questions have been addressed.   The patient has been advised of a variety of complications that might develop including but not limited to risks of death, stroke, paravalvular leak, aortic dissection or other major vascular complications, aortic annulus rupture, device embolization, cardiac rupture or perforation, mitral regurgitation, acute myocardial infarction, arrhythmia, heart block or bradycardia requiring permanent pacemaker placement, congestive heart failure, respiratory failure, renal failure, pneumonia, infection, other late complications related to structural valve deterioration or migration, or other complications that might ultimately cause a temporary or permanent loss of functional independence or other long term morbidity. The patient provides full informed consent for the procedure as described and all questions were answered.     Plan:  The patient will have a right and left heart catheterization tomorrow and we will review the results of his gated cardiac CTA and CTA of the chest, abdomen, and pelvis before making a final decision about the appropriateness of proceeding with transcatheter aortic valve replacement.  I spent 60 minutes performing this consultation and > 50% of this time was spent face to face counseling and coordinating the care of this patient's critical, symptomatic aortic stenosis.    Gaye Pollack, MD 04/04/2018

## 2018-04-04 NOTE — Consult Note (Addendum)
[facility]VE TEAM  Inpatient TAVR Consultation:   Patient ID: Dalton Townsend; [id number]; 04/14/1923   Admit date: 04/02/2018 Date of Consult: 04/04/2018  Primary Care Provider: Lajean Manes, MD Primary Cardiologist: Dr. Marlou Porch Primary Electrophysiologist: Dr. Lovena Le    Patient Profile:   Dalton Townsend is a 84 y.o. male with a hx of CAD s/p CABG (1999), PAD, carotid artery disease s/p R CEA after right CVA (2001), CHB block s/p PPM (2006), HTN, PAF on Eliquis, ulcerative colitis, non healing wound on left leg s/p plastics grafting (04/2016), thrombocytopenia, and severe symptomatic AS with acute CHF who is being seen today for the evaluation of severe AS at the request of Dr. Meda Coffee.  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 He has three daughters, one of which is a Nurse, adult here at [employer]. He remains very active and performs all of his own ADLS and exercises at the [facility] every day. He does walk with a cane and has a walker in the shower. He is a former smoker and sees a Pharmacist, community regularly.   Mr. Luber has the above complex cardiac history. Dr. Roxan Hockey did his bypass surgery back in 1999 (no op note available). He also had what he calls a mild stroke in 2001 and was found to have a high grade carotid stenosis s/p R CEA by Dr. Amedeo Plenty. He was found to have complete heart block in 2007 and underwent pacemaker placement by Dr. Verlon Setting. He had post operative PAF and then found to have PAF on device interrogation and started on Eliquis.   Since that time, he has been followed closely by Dr. Marlou Porch and Dr. Lovena Le. He had a non healing wound on his left leg. LE arterial dopplers in 01/2016 showed evidence of mild arterial occlusive disease with distal popliteal/ tibial disease as well as small vessel disease of the foot. He underwent surgical grafting by  plastics in 04/2016.   He has been known to have severe AS since an echocardiogram performed in 07/2016 which showed a mean gradient of 47 mm Hg. Evaluation with TAVR was offered several times but patient declined since he had no symptoms and was still able to go to the gym without limitations.   He was in his usual state of health until this past Sunday when he noticed worsening shortness of breath. He woke up for a nap and noticed he couldn't walk to the bathroom without significant dyspnea. His wife says he was visibly short of breath at rest. His granddaughter came over who called EMS. IN the [facility] ED he was found to have sodium 132 with other electrolytes stable, creatinine 1.05, troponin 0.03, BNP 1143,  Hgb 14.4. PLT 94. CXR with edema. Repeat echo showed normal LV systolic function and critical aortic stenosis with peak gradient of 88 mm Hg and mean transvalvular gradient of 53 mmHg. The patient's dimensionless index is 0.26 and calculated aortic valve area is 0.73 cm.   He is currently feeling much better after IV diuresis. He does admit to some recent LE edema and orthopnea. He is on lasix as an outpatient but admitted to not taking it regularly because of the inconvenience of having to urinate so much. He also has noticed a decrease in his exercise tolerance over the past year which he had just attributed to old age. No chest pain. No dizziness or  syncope. No blood in stool or urine. No palpitations. I had a long discussion with the patient, his wife and his daughter, [name], about TAVR and he is willing to proceed with work up.     Past Medical History:  Diagnosis Date  . BPH (benign prostatic hyperplasia)   . CAD (coronary artery disease)    3 vessel CAD, s/p CABG 04/1998  . Carotid artery disease ([place])   . Dyslipidemia   . Essential hypertension   . GAD (generalized anxiety disorder)   . Intestinal infection due to Clostridium difficile 03/2010  . PAF (paroxysmal atrial fibrillation)  ([facility])    Hx of post op PAF  . Presence of permanent cardiac pacemaker   . PVD (peripheral vascular disease) ([place])   . Severe aortic stenosis   . Ulcerative colitis     Past Surgical History:  Procedure Laterality Date  . CARDIAC CATHETERIZATION  1999  . CAROTID ENDARTERECTOMY Right 2004  . CATARACT EXTRACTION, BILATERAL  2011  . CORONARY ARTERY BYPASS GRAFT  04/1998   3 vessel CAD  . INCISION AND DRAINAGE OF WOUND Left 05/04/2016   Procedure: SURGICAL PREP FOR GRAFTING LEFT LEG WITH THERA SKIN APPLICATION;  Surgeon: Irene Limbo, MD;  Location: [facility];  Service: Plastics;  Laterality: Left;  . INTRAOPERATIVE ARTERIOGRAM  06/2000  . PACEMAKER INSERTION  03/25/2010  . TOTAL KNEE ARTHROPLASTY  07/16/09  . TRANSURETHRAL RESECTION OF PROSTATE  1998     Inpatient Medications: Scheduled Meds: . amLODipine  5 mg Oral Daily  . apixaban  5 mg Oral BID  . atorvastatin  20 mg Oral Daily  . balsalazide  2,250 mg Oral TID WC  . escitalopram  10 mg Oral Daily  . finasteride  5 mg Oral Daily  . furosemide  40 mg Intravenous BID  . latanoprost  1 drop Both Eyes QHS  . losartan  100 mg Oral Daily  . mouth rinse  15 mL Mouth Rinse BID  . metoprolol succinate  50 mg Oral Daily  . nitroGLYCERIN  1 inch Topical Q6H  . potassium chloride  40 mEq Oral Once  . potassium chloride  40 mEq Oral Once  . sodium chloride flush  3 mL Intravenous Q12H   Continuous Infusions:  PRN Meds:   Allergies:    Allergies  Allergen Reactions  . No Known Allergies     Social History:   Social History   Socioeconomic History  . Marital status: Married    Spouse name: Not on file  . Number of children: 3  . Years of education: Not on file  . Highest education level: Not on file  Occupational History  . Occupation: retired  Scientific laboratory technician  . Financial resource strain: Not on file  . Food insecurity:    Worry: Not on file    Inability: Not on file  . Transportation needs:    Medical: Not on file     Non-medical: Not on file  Tobacco Use  . Smoking status: Former Smoker    Years: 10.00    Types: Cigarettes    Last attempt to quit: 08/30/1952    Years since quitting: 65.6  . Smokeless tobacco: Former Network engineer and Sexual Activity  . Alcohol use: No  . Drug use: No  . Sexual activity: Not on file  Lifestyle  . Physical activity:    Days per week: Not on file    Minutes per session: Not on file  . Stress: Not on  file  Relationships  . Social connections:    Talks on phone: Not on file    Gets together: Not on file    Attends religious service: Not on file    Active member of club or organization: Not on file    Attends meetings of clubs or organizations: Not on file    Relationship status: Not on file  . Intimate partner violence:    Fear of current or ex partner: Not on file    Emotionally abused: Not on file    Physically abused: Not on file    Forced sexual activity: Not on file  Other Topics Concern  . Not on file  Social History Narrative  . Not on file    Family History:   The patient's family history includes Cancer in his unknown relative; Congestive Heart Failure in his mother; Heart failure in his unknown relative; Leukemia in his father and unknown relative.  ROS:  Please see the history of present illness.  ROS  All other ROS reviewed and negative.     Physical Exam/Data:   Vitals:   04/03/18 1657 04/03/18 2105 04/04/18 0552 04/04/18 0700  BP: 134/70 (!) 142/81 (!) 166/93   Pulse: 72 78 93   Resp: 18 20 20    Temp: 98.4 F (36.9 C) 98.2 F (36.8 C) 98.8 F (37.1 C)   TempSrc: Oral Oral Oral   SpO2: 93% 93% 93%   Weight:    188 lb 7.9 oz (85.5 kg)  Height:        Intake/Output Summary (Last 24 hours) at 04/04/2018 0806 Last data filed at 04/04/2018 0700 Gross per 24 hour  Intake 820 ml  Output 3200 ml  Net -2380 ml   Filed Weights   04/03/18 0135 04/04/18 0700  Weight: 193 lb 12.6 oz (87.9 kg) 188 lb 7.9 oz (85.5 kg)   Body mass index  is 26.29 kg/m.  General:  Well nourished, well developed, in no acute distress HEENT: normal Lymph: no adenopathy Neck: no JVD Endocrine:  No thryomegaly Vascular: + bilateral carotid bruits.   Cardiac:  normal S1, S2; irreg irreg, harsh 4/6 SEM heard best at RUSB Lungs: decreased breath sounds at bases. Murmur audible on lung exam Abd: soft, nontender, no hepatomegaly  Ext: trace pretibial edema  Musculoskeletal:  No deformities, BUE and BLE strength normal and equal Skin: warm and dry  Neuro:  CNs 2-12 intact, no focal abnormalities noted Psych:  Normal affect   EKG:  The EKG was personally reviewed and demonstrates: sinus tach with LBBB Telemetry:  Telemetry was personally reviewed and demonstrates: afib with PVCs and occasional paced beats.   Relevant CV Studies: 2D ECHO 04/03/18 Study Conclusions - Left ventricle: Abnormal septal motion inferior basal hypokinesis   The cavity size was mildly dilated. Wall thickness was increased   in a pattern of moderate LVH. Systolic function was normal. The   estimated ejection fraction was in the range of 50% to 55%. Left   ventricular diastolic function parameters were normal. - Aortic valve: There was critical stenosis. There was mild   regurgitation. Valve area (VTI): 0.74 cm^2. Valve area (Vmax):   0.82 cm^2. Valve area (Vmean): 0.73 cm^2. - Mitral valve: Calcified annulus. Mildly thickened leaflets .   There was mild regurgitation. - Left atrium: The atrium was moderately dilated. - Right atrium: The atrium was mildly dilated. - Atrial septum: No defect or patent foramen ovale was identified.  Laboratory Data:  Chemistry Recent Labs  Lab 04/02/18 1926 04/03/18 0459 04/04/18 0419  NA 132* 138 137  K 3.8 3.3* 3.4*  CL 96* 104 100  CO2 23 25 25   GLUCOSE 142* 112* 117*  BUN 20 21 23   CREATININE 1.05 0.98 0.97  CALCIUM 9.1 8.8* 8.7*  GFRNONAA 59* >60 >60  GFRAA >60 >60 >60  ANIONGAP 13 9 12     No results for input(s):  PROT, ALBUMIN, AST, ALT, ALKPHOS, BILITOT in the last 168 hours. Hematology Recent Labs  Lab 04/02/18 1926 04/03/18 0459 04/04/18 0419  WBC 6.4 3.6* 3.9*  RBC 4.62 3.97* 4.24  HGB 14.1 12.1* 13.0  HCT 44.1 37.3* 39.6  MCV 95.5 94.0 93.4  MCH 30.5 30.5 30.7  MCHC 32.0 32.4 32.8  RDW 13.9 13.8 13.9  PLT 113* 92* 94*   Cardiac Enzymes Recent Labs  Lab 04/02/18 1926  TROPONINI 0.03*    Recent Labs  Lab 04/02/18 1928  TROPIPOC 0.03    BNP Recent Labs  Lab 04/02/18 1927  BNP 1,143.9*    DDimer No results for input(s): DDIMER in the last 168 hours.  Radiology/Studies:  Dg Chest Port 1 View  Result Date: 04/02/2018 CLINICAL DATA:  Shortness of breath, wheezing. EXAM: PORTABLE CHEST 1 VIEW COMPARISON:  Chest radiograph March 19, 2017 FINDINGS: Cardiac silhouette is mildly enlarged unchanged. Calcified aortic arch. Status post median sternotomy for CABG. Increased interstitial prominence. Trace pleural effusions. Patchy RIGHT lung base airspace opacity. Bandlike density LEFT lung base. RIGHT mid lung zone scarring. No pneumothorax. LEFT cardiac pacemaker in situ. Osteopenia. IMPRESSION: Increasing interstitial prominence seen with atypical infection or pulmonary edema with RIGHT lung base consolidation. Trace pleural effusions. LEFT lung base atelectasis/scarring. Stable cardiomegaly. Aortic Atherosclerosis (ICD10-I70.0). Electronically Signed   By: Elon Alas M.D.   On: 04/02/2018 19:42     STS Risk Calculator:  Procedure: Isolated AVR Risk of Mortality:  8.777%  Renal Failure:  4.350%  Permanent Stroke:  3.360%  Prolonged Ventilation:  17.165%  DSW Infection:  0.348%  Reoperation:  4.509%  Morbidity or Mortality:  24.414%  Short Length of Stay:  13.399%  Long Length of Stay:  14.912%    Procedure: AVR + CAB Risk of Mortality:  12.022%  Renal Failure:  7.511%  Permanent Stroke:  4.352%  Prolonged Ventilation:  25.659%  DSW Infection:  0.345%  Reoperation:   8.281%  Morbidity or Mortality:  34.879%  Short Length of Stay:  7.089%  Long Length of Stay:  26.802%      Assessment and Plan:   DIJON COSENS is a 84 y.o. male with symptoms of severe, stage D1 aortic stenosis with NYHA Class III symptoms. I have reviewed the patient's recent echocardiogram which is notable for normal LV systolic function and critical aortic stenosis with peak gradient of 88 mm Hg and mean transvalvular gradient of 53 mmHg. The patient's dimensionless index is 0.26 and calculated aortic valve area is 0.73 cm.   I have reviewed the natural history of aortic stenosis with the patient. We have discussed the limitations of medical therapy and the poor prognosis associated with symptomatic aortic stenosis. We have reviewed potential treatment options, including palliative medical therapy, conventional surgical aortic valve replacement, and transcatheter aortic valve replacement. We discussed treatment options in the context of this patient's specific comorbid medical conditions.   He is a very functional and independent 84 year old. Despite having severe AS since at least 07/2016, he has done quite well and has been able to exercise regularly  until very recently. He presented this past weekend with shortness of breath and found to be in acute diastolic CHF requiring IV diuresis. Repeat echo showed critical AS.   The patient's predicted risk of mortality with conventional aortic valve replacement is 12.022% primarily based on his age, previous bypass surgery, history of CVA with R CEA, PAD, PAF on Eliquis (currently in afib), history of pacemaker placement and HTN. Other significant comorbid conditions include ulcerative colitis. TAVR seems like a reasonable treatment option for this patient pending formal cardiac surgical consultation.   We discussed typical evaluation which will require a gated cardiac CTA and a CTA of the chest/abdomen/pelvis to evaluate both his cardiac  anatomy and peripheral vasculature and R/LHC. I have arranged for both of these to be done while he is inpatient. CT's will be done today and R/LHC has been arranged for tomorrow with Dr. Angelena Form. I have stopped his Eliquis and started a heparin bridge in anticipation of cardiac cath. Fortunately, he has normal renal function. I am holding home losartan given contrast studies. He got IV lasix this AM. I will also hold this as well. He will need to go home on lasix. Dr. Angelena Form and Dr. Cyndia Bent to follow.    Signed, Angelena Form, PA-C  04/04/2018 8:06 AM   I have personally seen and examined this patient. I agree with the assessment and plan as outlined above.  Mr. Noorani is a pleasant 84 yo male with atrial fib, severe AS, CAD s/p CABG, carotid artery disease s/p CEA, PAD, CHB s/p PPM admitted with dyspnea/acute diastolic CHF. He is very active and exercises every day. No dyspnea until Sunday August 4th. No chest pain. No dizziness. Echo images reviewed. He has severe AS. LV function is normal.  EKG reviewed by me. Labs reviewed by me.  He is admitted with dyspnea and volume overload. Symptoms improved post diuresis. My exam:   General: Well developed, well nourished, NAD  HEENT: OP clear, mucus membranes moist  SKIN: warm, dry. No rashes. Neuro: No focal deficits  Musculoskeletal: Muscle strength 5/5 all ext  Psychiatric: Mood and affect normal  Neck: No JVD, + carotid bruits, no thyromegaly, no lymphadenopathy.  Lungs:Clear bilaterally, no wheezes, rhonci, crackles Cardiovascular: Regular rate and rhythm. Loud harsh, late peaking systolic murmur.  Abdomen:Soft. Bowel sounds present. Non-tender.  Extremities: Trace bilateral lower extremity edema. Pulses are 2 + in the bilateral DP/PT.  Plan: He has severe AS. Will begin workup for TAVR. He is not a surgical AVR candidate given advanced age and prior open heart surgery for CABG. Will arrange CT scans today and will plan right and left  heart cath tomorrow if renal function stable. Can hopefully arrange carotid dopplers and PFTs prior to discharge.  Dr. Cyndia Bent to also see today for surgical opinion. Will continue with pre-testing for TAVR.  Anticoagulation on hold for cath.  I reviewed the risks of the cardiac cath and the TAVR procedure with the patient and his daughters at the bedside today. He wishes to proceed with planning for TAVR.   Lauree Chandler 04/04/2018 11:28 AM

## 2018-04-04 NOTE — H&P (View-Only) (Signed)
[place] VALVE TEAM  Inpatient TAVR Consultation:   Patient ID: Dalton Townsend; [id number]; 1923-01-30   Admit date: 04/02/2018 Date of Consult: 04/04/2018  Primary Care Provider: Lajean Manes, MD Primary Cardiologist: Dr. Marlou Porch Primary Electrophysiologist: Dr. Lovena Le    Patient Profile:   Dalton Townsend is a 84 y.o. male with a hx of CAD s/p CABG (1999), PAD, carotid artery disease s/p R CEA after right CVA (2001), CHB block s/p PPM (2006), HTN, PAF on Eliquis, ulcerative colitis, non healing wound on left leg s/p plastics grafting (04/2016), thrombocytopenia, and severe symptomatic AS with acute CHF who is being seen today for the evaluation of severe AS at the request of Dr. Meda Coffee.  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 He has three daughters, one of which is a Nurse, adult here at [employer]. He remains very active and performs all of his own ADLS and exercises at the [facility] every day. He does walk with a cane and has a walker in the shower. He is a former smoker and sees a Pharmacist, community regularly.   Dalton Townsend has the above complex cardiac history. Dr. Roxan Townsend did his bypass surgery back in 1999 (no op note available). He also had what he calls a mild stroke in 2001 and was found to have a high grade carotid stenosis s/p R CEA by Dr. Amedeo Plenty. He was found to have complete heart block in 2007 and underwent pacemaker placement by Dr. Verlon Setting. He had post operative PAF and then found to have PAF on device interrogation and started on Eliquis.   Since that time, he has been followed closely by Dr. Marlou Porch and Dr. Lovena Le. He had a non healing wound on his left leg. LE arterial dopplers in 01/2016 showed evidence of mild arterial occlusive disease with distal popliteal/ tibial disease as well as small vessel disease of the foot. He underwent surgical grafting by  plastics in 04/2016.   He has been known to have severe AS since an echocardiogram performed in 07/2016 which showed a mean gradient of 47 mm Hg. Evaluation with TAVR was offered several times but patient declined since he had no symptoms and was still able to go to the gym without limitations.   He was in his usual state of health until this past Sunday when he noticed worsening shortness of breath. He woke up for a nap and noticed he couldn't walk to the bathroom without significant dyspnea. His wife says he was visibly short of breath at rest. His granddaughter came over who called EMS. IN the [facility] ED he was found to have sodium 132 with other electrolytes stable, creatinine 1.05, troponin 0.03, BNP 1143,  Hgb 14.4. PLT 94. CXR with edema. Repeat echo showed normal LV systolic function and critical aortic stenosis with peak gradient of 88 mm Hg and mean transvalvular gradient of 53 mmHg. The patient's dimensionless index is 0.26 and calculated aortic valve area is 0.73 cm.   He is currently feeling much better after IV diuresis. He does admit to some recent LE edema and orthopnea. He is on lasix as an outpatient but admitted to not taking it regularly because of the inconvenience of having to urinate so much. He also has noticed a decrease in his exercise tolerance over the past year which he had just attributed to old age. No chest pain. No dizziness or  syncope. No blood in stool or urine. No palpitations. I had a long discussion with the patient, his wife and his daughter, [name], about TAVR and he is willing to proceed with work up.     Past Medical History:  Diagnosis Date  . BPH (benign prostatic hyperplasia)   . CAD (coronary artery disease)    3 vessel CAD, s/p CABG 04/1998  . Carotid artery disease ([place])   . Dyslipidemia   . Essential hypertension   . GAD (generalized anxiety disorder)   . Intestinal infection due to Clostridium difficile 03/2010  . PAF (paroxysmal atrial fibrillation)  (HCC)    Hx of post op PAF  . Presence of permanent cardiac pacemaker   . PVD (peripheral vascular disease) ([place])   . Severe aortic stenosis   . Ulcerative colitis     Past Surgical History:  Procedure Laterality Date  . CARDIAC CATHETERIZATION  1999  . CAROTID ENDARTERECTOMY Right 2004  . CATARACT EXTRACTION, BILATERAL  2011  . CORONARY ARTERY BYPASS GRAFT  04/1998   3 vessel CAD  . INCISION AND DRAINAGE OF WOUND Left 05/04/2016   Procedure: SURGICAL PREP FOR GRAFTING LEFT LEG WITH THERA SKIN APPLICATION;  Surgeon: Irene Limbo, MD;  Location: [facility];  Service: Plastics;  Laterality: Left;  . INTRAOPERATIVE ARTERIOGRAM  06/2000  . PACEMAKER INSERTION  03/25/2010  . TOTAL KNEE ARTHROPLASTY  07/16/09  . TRANSURETHRAL RESECTION OF PROSTATE  1998     Inpatient Medications: Scheduled Meds: . amLODipine  5 mg Oral Daily  . apixaban  5 mg Oral BID  . atorvastatin  20 mg Oral Daily  . balsalazide  2,250 mg Oral TID WC  . escitalopram  10 mg Oral Daily  . finasteride  5 mg Oral Daily  . furosemide  40 mg Intravenous BID  . latanoprost  1 drop Both Eyes QHS  . losartan  100 mg Oral Daily  . mouth rinse  15 mL Mouth Rinse BID  . metoprolol succinate  50 mg Oral Daily  . nitroGLYCERIN  1 inch Topical Q6H  . potassium chloride  40 mEq Oral Once  . potassium chloride  40 mEq Oral Once  . sodium chloride flush  3 mL Intravenous Q12H   Continuous Infusions:  PRN Meds:   Allergies:    Allergies  Allergen Reactions  . No Known Allergies     Social History:   Social History   Socioeconomic History  . Marital status: Married    Spouse name: Not on file  . Number of children: 3  . Years of education: Not on file  . Highest education level: Not on file  Occupational History  . Occupation: retired  Scientific laboratory technician  . Financial resource strain: Not on file  . Food insecurity:    Worry: Not on file    Inability: Not on file  . Transportation needs:    Medical: Not on file     Non-medical: Not on file  Tobacco Use  . Smoking status: Former Smoker    Years: 10.00    Types: Cigarettes    Last attempt to quit: 08/30/1952    Years since quitting: 65.6  . Smokeless tobacco: Former Network engineer and Sexual Activity  . Alcohol use: No  . Drug use: No  . Sexual activity: Not on file  Lifestyle  . Physical activity:    Days per week: Not on file    Minutes per session: Not on file  . Stress: Not on  file  Relationships  . Social connections:    Talks on phone: Not on file    Gets together: Not on file    Attends religious service: Not on file    Active member of club or organization: Not on file    Attends meetings of clubs or organizations: Not on file    Relationship status: Not on file  . Intimate partner violence:    Fear of current or ex partner: Not on file    Emotionally abused: Not on file    Physically abused: Not on file    Forced sexual activity: Not on file  Other Topics Concern  . Not on file  Social History Narrative  . Not on file    Family History:   The patient's family history includes Cancer in his unknown relative; Congestive Heart Failure in his mother; Heart failure in his unknown relative; Leukemia in his father and unknown relative.  ROS:  Please see the history of present illness.  ROS  All other ROS reviewed and negative.     Physical Exam/Data:   Vitals:   04/03/18 1657 04/03/18 2105 04/04/18 0552 04/04/18 0700  BP: 134/70 (!) 142/81 (!) 166/93   Pulse: 72 78 93   Resp: 18 20 20    Temp: 98.4 F (36.9 C) 98.2 F (36.8 C) 98.8 F (37.1 C)   TempSrc: Oral Oral Oral   SpO2: 93% 93% 93%   Weight:    188 lb 7.9 oz (85.5 kg)  Height:        Intake/Output Summary (Last 24 hours) at 04/04/2018 0806 Last data filed at 04/04/2018 0700 Gross per 24 hour  Intake 820 ml  Output 3200 ml  Net -2380 ml   Filed Weights   04/03/18 0135 04/04/18 0700  Weight: 193 lb 12.6 oz (87.9 kg) 188 lb 7.9 oz (85.5 kg)   Body mass index  is 26.29 kg/m.  General:  Well nourished, well developed, in no acute distress HEENT: normal Lymph: no adenopathy Neck: no JVD Endocrine:  No thryomegaly Vascular: + bilateral carotid bruits.   Cardiac:  normal S1, S2; irreg irreg, harsh 4/6 SEM heard best at RUSB Lungs: decreased breath sounds at bases. Murmur audible on lung exam Abd: soft, nontender, no hepatomegaly  Ext: trace pretibial edema  Musculoskeletal:  No deformities, BUE and BLE strength normal and equal Skin: warm and dry  Neuro:  CNs 2-12 intact, no focal abnormalities noted Psych:  Normal affect   EKG:  The EKG was personally reviewed and demonstrates: sinus tach with LBBB Telemetry:  Telemetry was personally reviewed and demonstrates: afib with PVCs and occasional paced beats.   Relevant CV Studies: 2D ECHO 04/03/18 Study Conclusions - Left ventricle: Abnormal septal motion inferior basal hypokinesis   The cavity size was mildly dilated. Wall thickness was increased   in a pattern of moderate LVH. Systolic function was normal. The   estimated ejection fraction was in the range of 50% to 55%. Left   ventricular diastolic function parameters were normal. - Aortic valve: There was critical stenosis. There was mild   regurgitation. Valve area (VTI): 0.74 cm^2. Valve area (Vmax):   0.82 cm^2. Valve area (Vmean): 0.73 cm^2. - Mitral valve: Calcified annulus. Mildly thickened leaflets .   There was mild regurgitation. - Left atrium: The atrium was moderately dilated. - Right atrium: The atrium was mildly dilated. - Atrial septum: No defect or patent foramen ovale was identified.  Laboratory Data:  Chemistry Recent Labs  Lab 04/02/18 1926 04/03/18 0459 04/04/18 0419  NA 132* 138 137  K 3.8 3.3* 3.4*  CL 96* 104 100  CO2 23 25 25   GLUCOSE 142* 112* 117*  BUN 20 21 23   CREATININE 1.05 0.98 0.97  CALCIUM 9.1 8.8* 8.7*  GFRNONAA 59* >60 >60  GFRAA >60 >60 >60  ANIONGAP 13 9 12     No results for input(s):  PROT, ALBUMIN, AST, ALT, ALKPHOS, BILITOT in the last 168 hours. Hematology Recent Labs  Lab 04/02/18 1926 04/03/18 0459 04/04/18 0419  WBC 6.4 3.6* 3.9*  RBC 4.62 3.97* 4.24  HGB 14.1 12.1* 13.0  HCT 44.1 37.3* 39.6  MCV 95.5 94.0 93.4  MCH 30.5 30.5 30.7  MCHC 32.0 32.4 32.8  RDW 13.9 13.8 13.9  PLT 113* 92* 94*   Cardiac Enzymes Recent Labs  Lab 04/02/18 1926  TROPONINI 0.03*    Recent Labs  Lab 04/02/18 1928  TROPIPOC 0.03    BNP Recent Labs  Lab 04/02/18 1927  BNP 1,143.9*    DDimer No results for input(s): DDIMER in the last 168 hours.  Radiology/Studies:  Dg Chest Port 1 View  Result Date: 04/02/2018 CLINICAL DATA:  Shortness of breath, wheezing. EXAM: PORTABLE CHEST 1 VIEW COMPARISON:  Chest radiograph March 19, 2017 FINDINGS: Cardiac silhouette is mildly enlarged unchanged. Calcified aortic arch. Status post median sternotomy for CABG. Increased interstitial prominence. Trace pleural effusions. Patchy RIGHT lung base airspace opacity. Bandlike density LEFT lung base. RIGHT mid lung zone scarring. No pneumothorax. LEFT cardiac pacemaker in situ. Osteopenia. IMPRESSION: Increasing interstitial prominence seen with atypical infection or pulmonary edema with RIGHT lung base consolidation. Trace pleural effusions. LEFT lung base atelectasis/scarring. Stable cardiomegaly. Aortic Atherosclerosis (ICD10-I70.0). Electronically Signed   By: Elon Alas M.D.   On: 04/02/2018 19:42     STS Risk Calculator:  Procedure: Isolated AVR Risk of Mortality:  8.777%  Renal Failure:  4.350%  Permanent Stroke:  3.360%  Prolonged Ventilation:  17.165%  DSW Infection:  0.348%  Reoperation:  4.509%  Morbidity or Mortality:  24.414%  Short Length of Stay:  13.399%  Long Length of Stay:  14.912%    Procedure: AVR + CAB Risk of Mortality:  12.022%  Renal Failure:  7.511%  Permanent Stroke:  4.352%  Prolonged Ventilation:  25.659%  DSW Infection:  0.345%  Reoperation:   8.281%  Morbidity or Mortality:  34.879%  Short Length of Stay:  7.089%  Long Length of Stay:  26.802%      Assessment and Plan:   KENDRYCK LACROIX is a 84 y.o. male with symptoms of severe, stage D1 aortic stenosis with NYHA Class III symptoms. I have reviewed the patient's recent echocardiogram which is notable for normal LV systolic function and critical aortic stenosis with peak gradient of 88 mm Hg and mean transvalvular gradient of 53 mmHg. The patient's dimensionless index is 0.26 and calculated aortic valve area is 0.73 cm.   I have reviewed the natural history of aortic stenosis with the patient. We have discussed the limitations of medical therapy and the poor prognosis associated with symptomatic aortic stenosis. We have reviewed potential treatment options, including palliative medical therapy, conventional surgical aortic valve replacement, and transcatheter aortic valve replacement. We discussed treatment options in the context of this patient's specific comorbid medical conditions.   He is a very functional and independent 84 year old. Despite having severe AS since at least 07/2016, he has done quite well and has been able to exercise regularly  until very recently. He presented this past weekend with shortness of breath and found to be in acute diastolic CHF requiring IV diuresis. Repeat echo showed critical AS.   The patient's predicted risk of mortality with conventional aortic valve replacement is 12.022% primarily based on his age, previous bypass surgery, history of CVA with R CEA, PAD, PAF on Eliquis (currently in afib), history of pacemaker placement and HTN. Other significant comorbid conditions include ulcerative colitis. TAVR seems like a reasonable treatment option for this patient pending formal cardiac surgical consultation.   We discussed typical evaluation which will require a gated cardiac CTA and a CTA of the chest/abdomen/pelvis to evaluate both his cardiac  anatomy and peripheral vasculature and R/LHC. I have arranged for both of these to be done while he is inpatient. CT's will be done today and R/LHC has been arranged for tomorrow with Dr. Angelena Form. I have stopped his Eliquis and started a heparin bridge in anticipation of cardiac cath. Fortunately, he has normal renal function. I am holding home losartan given contrast studies. He got IV lasix this AM. I will also hold this as well. He will need to go home on lasix. Dr. Angelena Form and Dr. Cyndia Bent to follow.    Signed, Angelena Form, PA-C  04/04/2018 8:06 AM   I have personally seen and examined this patient. I agree with the assessment and plan as outlined above.  Dalton Townsend is a pleasant 84 yo male with atrial fib, severe AS, CAD s/p CABG, carotid artery disease s/p CEA, PAD, CHB s/p PPM admitted with dyspnea/acute diastolic CHF. He is very active and exercises every day. No dyspnea until Sunday August 4th. No chest pain. No dizziness. Echo images reviewed. He has severe AS. LV function is normal.  EKG reviewed by me. Labs reviewed by me.  He is admitted with dyspnea and volume overload. Symptoms improved post diuresis. My exam:   General: Well developed, well nourished, NAD  HEENT: OP clear, mucus membranes moist  SKIN: warm, dry. No rashes. Neuro: No focal deficits  Musculoskeletal: Muscle strength 5/5 all ext  Psychiatric: Mood and affect normal  Neck: No JVD, + carotid bruits, no thyromegaly, no lymphadenopathy.  Lungs:Clear bilaterally, no wheezes, rhonci, crackles Cardiovascular: Regular rate and rhythm. Loud harsh, late peaking systolic murmur.  Abdomen:Soft. Bowel sounds present. Non-tender.  Extremities: Trace bilateral lower extremity edema. Pulses are 2 + in the bilateral DP/PT.  Plan: He has severe AS. Will begin workup for TAVR. He is not a surgical AVR candidate given advanced age and prior open heart surgery for CABG. Will arrange CT scans today and will plan right and left  heart cath tomorrow if renal function stable. Can hopefully arrange carotid dopplers and PFTs prior to discharge.  Dr. Cyndia Bent to also see today for surgical opinion. Will continue with pre-testing for TAVR.  Anticoagulation on hold for cath.  I reviewed the risks of the cardiac cath and the TAVR procedure with the patient and his daughters at the bedside today. He wishes to proceed with planning for TAVR.   Lauree Chandler 04/04/2018 11:28 AM

## 2018-04-04 NOTE — Progress Notes (Signed)
ANTICOAGULATION CONSULT NOTE-follow up  Pharmacy Consult for Heparin Indication: Bridge while Apixaban on hold for hx Afib/CVA  Allergies  Allergen Reactions  . No Known Allergies     Patient Measurements: Height: 5\' 11"  (180.3 cm) Weight: 188 lb 7.9 oz (85.5 kg) IBW/kg (Calculated) : 75.3 Heparin Dosing Weight: 85.5 kg  Vital Signs: BP: 104/67 (08/06 1058) Pulse Rate: 64 (08/06 1058)  Labs: Recent Labs    04/02/18 1926 04/03/18 0459 04/04/18 0419 04/04/18 0901 04/04/18 1942  HGB 14.1 12.1* 13.0  --   --   HCT 44.1 37.3* 39.6  --   --   PLT 113* 92* 94*  --   --   APTT  --   --   --  44* 64*  LABPROT 15.2  --   --   --   --   INR 1.21  --   --   --   --   HEPARINUNFRC  --   --   --  2.08*  --   CREATININE 1.05 0.98 0.97  --   --   TROPONINI 0.03*  --   --   --   --     Estimated Creatinine Clearance: 51.8 mL/min (by C-G formula based on SCr of 0.97 mg/dL).   Medical History: Past Medical History:  Diagnosis Date  . BPH (benign prostatic hyperplasia)   . CAD (coronary artery disease)    3 vessel CAD, s/p CABG 04/1998  . Carotid artery disease ([place])    a. s/p R CEA   . Dyslipidemia   . Essential hypertension   . GAD (generalized anxiety disorder)   . Intestinal infection due to Clostridium difficile 03/2010  . PAF (paroxysmal atrial fibrillation) (HCC)    a. on Eliquis  . Presence of permanent cardiac pacemaker   . PVD (peripheral vascular disease) ([place])   . Severe aortic stenosis   . Ulcerative colitis     Assessment: 19 YOM who presented on 8/4 with SOB. Cardiology has been consulted and appears to be planning a cardiac cath for evaluation. The patient was on apixaban PTA for hx Afib/CVA - pharmacy consulted to transition to Heparin.  Last Apixaban dose on 8/5 @ 2200.  aPTT = 64 on heparin 1050 units/hr , just a little below therapeutic goal.  No bleeding noted Using aPTT values to monitor due to heparin level likely falsely elevated secondary to  effect of recent apixaban.  This morning the Hgb/Hct wnl, plts 94 - will watch.  Goal of Therapy:  Heparin level 0.3-0.7 units/ml aPTT 66-102 seconds Monitor platelets by anticoagulation protocol: Yes   Plan:  Increase Heparin to 1200 units/hr (12 ml/hr) Daily aPTT and HL until correlated Will continue to monitor for any signs/symptoms of bleeding.  Thank you for allowing pharmacy to be a part of this patient's care. [name], RPh Clinical Pharmacist Pager: [phone] Clinical phone for 04/04/2018 from 7a-3:30p: [phone] If after 3:30p, please call main pharmacy at: [phone] Please check AMION for all [place] numbers 04/04/2018 9:29 PM

## 2018-04-04 NOTE — Progress Notes (Signed)
Preliminary notes--Bilalteral carotid duplex exam completed. Right ICA 80-99% stenosis. Left ICA 1-39% stenosis. Bilateral vertebral arteries antegrade flow.  Dalton Townsend (RDMS RVT) 04/04/18 8:07 PM

## 2018-04-05 ENCOUNTER — Encounter: Payer: Self-pay | Admitting: Thoracic Surgery (Cardiothoracic Vascular Surgery)

## 2018-04-05 ENCOUNTER — Encounter (HOSPITAL_COMMUNITY)
Admission: EM | Disposition: A | Discharge status: Home / Self Care | Payer: Self-pay | Source: Home / Self Care | Attending: Internal Medicine

## 2018-04-05 ENCOUNTER — Encounter: Payer: Self-pay | Admitting: Cardiology

## 2018-04-05 HISTORY — PX: RIGHT/LEFT HEART CATH AND CORONARY/GRAFT ANGIOGRAPHY: CATH118267

## 2018-04-05 LAB — POCT I-STAT 3, ART BLOOD GAS (G3+)
ACID-BASE DEFICIT: 2 mmol/L (ref 0.0–2.0)
Bicarbonate: 21.7 mmol/L (ref 20.0–28.0)
O2 Saturation: 95 %
PH ART: 7.41 (ref 7.350–7.450)
TCO2: 23 mmol/L (ref 22–32)
pCO2 arterial: 34.3 mmHg (ref 32.0–48.0)
pO2, Arterial: 73 mmHg — ABNORMAL LOW (ref 83.0–108.0)

## 2018-04-05 LAB — BASIC METABOLIC PANEL
Anion gap: 10 (ref 5–15)
BUN: 32 mg/dL — AB (ref 8–23)
CHLORIDE: 101 mmol/L (ref 98–111)
CO2: 26 mmol/L (ref 22–32)
CREATININE: 1.23 mg/dL (ref 0.61–1.24)
Calcium: 8.9 mg/dL (ref 8.9–10.3)
GFR calc Af Amer: 57 mL/min — ABNORMAL LOW (ref >60)
GFR calc non Af Amer: 49 mL/min — ABNORMAL LOW (ref >60)
Glucose, Bld: 106 mg/dL — ABNORMAL HIGH (ref 70–99)
POTASSIUM: 3.7 mmol/L (ref 3.5–5.1)
Sodium: 137 mmol/L (ref 135–145)

## 2018-04-05 LAB — MAGNESIUM: MAGNESIUM: 2 mg/dL (ref 1.7–2.4)

## 2018-04-05 LAB — APTT: aPTT: 94 seconds — ABNORMAL HIGH (ref 24–36)

## 2018-04-05 LAB — POCT I-STAT 3, VENOUS BLOOD GAS (G3P V)
Acid-base deficit: 2 mmol/L (ref 0.0–2.0)
Bicarbonate: 23 mmol/L (ref 20.0–28.0)
O2 SAT: 61 %
PCO2 VEN: 38.9 mmHg — AB (ref 44.0–60.0)
TCO2: 24 mmol/L (ref 22–32)
pH, Ven: 7.38 (ref 7.250–7.430)
pO2, Ven: 32 mmHg (ref 32.0–45.0)

## 2018-04-05 LAB — CBC
HEMATOCRIT: 40 % (ref 39.0–52.0)
Hemoglobin: 13.1 g/dL (ref 13.0–17.0)
MCH: 31 pg (ref 26.0–34.0)
MCHC: 32.8 g/dL (ref 30.0–36.0)
MCV: 94.6 fL (ref 78.0–100.0)
PLATELETS: 110 10*3/uL — AB (ref 150–400)
RBC: 4.23 MIL/uL (ref 4.22–5.81)
RDW: 14.3 % (ref 11.5–15.5)
WBC: 5 10*3/uL (ref 4.0–10.5)

## 2018-04-05 LAB — HEPARIN LEVEL (UNFRACTIONATED): Heparin Unfractionated: 0.95 IU/mL — ABNORMAL HIGH (ref 0.30–0.70)

## 2018-04-05 SURGERY — RIGHT/LEFT HEART CATH AND CORONARY/GRAFT ANGIOGRAPHY
Anesthesia: LOCAL

## 2018-04-05 MED ORDER — SODIUM CHLORIDE 0.9% FLUSH
3.0000 mL | Freq: Two times a day (BID) | INTRAVENOUS | Status: DC
  Administered 2018-04-05 – 2018-04-06 (×2): 3 mL via INTRAVENOUS

## 2018-04-05 MED ORDER — MIDAZOLAM HCL 2 MG/2ML IJ SOLN
INTRAMUSCULAR | Status: AC
  Filled 2018-04-05: qty 2

## 2018-04-05 MED ORDER — IOHEXOL 350 MG/ML SOLN
INTRAVENOUS | Status: DC | PRN
  Administered 2018-04-05: 115 mL

## 2018-04-05 MED ORDER — HEPARIN SODIUM (PORCINE) 1000 UNIT/ML IJ SOLN
INTRAMUSCULAR | Status: AC
  Filled 2018-04-05: qty 1

## 2018-04-05 MED ORDER — FENTANYL CITRATE (PF) 100 MCG/2ML IJ SOLN
INTRAMUSCULAR | Status: AC
  Filled 2018-04-05: qty 2

## 2018-04-05 MED ORDER — HEPARIN (PORCINE) IN NACL 1000-0.9 UT/500ML-% IV SOLN
INTRAVENOUS | Status: DC | PRN
  Administered 2018-04-05 (×2): 500 mL

## 2018-04-05 MED ORDER — MIDAZOLAM HCL 2 MG/2ML IJ SOLN
INTRAMUSCULAR | Status: DC | PRN
  Administered 2018-04-05: 0.5 mg via INTRAVENOUS

## 2018-04-05 MED ORDER — LIDOCAINE HCL (PF) 1 % IJ SOLN
INTRAMUSCULAR | Status: AC
Start: 2018-04-05 — End: ?
  Filled 2018-04-05: qty 30

## 2018-04-05 MED ORDER — HEPARIN (PORCINE) IN NACL 100-0.45 UNIT/ML-% IJ SOLN
1200.0000 [IU]/h | INTRAMUSCULAR | Status: DC
  Administered 2018-04-05 – 2018-04-06 (×2): 1200 [IU]/h via INTRAVENOUS
  Filled 2018-04-05 (×2): qty 250

## 2018-04-05 MED ORDER — HEPARIN SODIUM (PORCINE) 1000 UNIT/ML IJ SOLN
INTRAMUSCULAR | Status: DC | PRN
  Administered 2018-04-05: 4500 [IU] via INTRAVENOUS

## 2018-04-05 MED ORDER — FENTANYL CITRATE (PF) 100 MCG/2ML IJ SOLN
INTRAMUSCULAR | Status: DC | PRN
  Administered 2018-04-05: 25 ug via INTRAVENOUS

## 2018-04-05 MED ORDER — HEPARIN (PORCINE) IN NACL 1000-0.9 UT/500ML-% IV SOLN
INTRAVENOUS | Status: AC
  Filled 2018-04-05: qty 1000

## 2018-04-05 MED ORDER — SODIUM CHLORIDE 0.9% FLUSH: 3.0000 mL | INTRAVENOUS | Status: DC | PRN

## 2018-04-05 MED ORDER — VERAPAMIL HCL 2.5 MG/ML IV SOLN
INTRAVENOUS | Status: DC | PRN
  Administered 2018-04-05: 10 mL via INTRA_ARTERIAL

## 2018-04-05 MED ORDER — VERAPAMIL HCL 2.5 MG/ML IV SOLN
INTRAVENOUS | Status: AC
  Filled 2018-04-05: qty 2

## 2018-04-05 MED ORDER — SODIUM CHLORIDE 0.9 % IV SOLN
INTRAVENOUS | Status: AC
  Administered 2018-04-05: 12:00:00 via INTRAVENOUS

## 2018-04-05 MED ORDER — SODIUM CHLORIDE 0.9 % IV SOLN: 250.0000 mL | INTRAVENOUS | Status: DC | PRN

## 2018-04-05 MED ORDER — LIDOCAINE HCL (PF) 1 % IJ SOLN
INTRAMUSCULAR | Status: DC | PRN
  Administered 2018-04-05 (×2): 2 mL via INTRADERMAL

## 2018-04-05 SURGICAL SUPPLY — 18 items
CATH BALLN WEDGE 5F 110CM (CATHETERS) ×2 IMPLANT
CATH EXPO 5F MPA-1 (CATHETERS) ×1 IMPLANT
CATH INFINITI 5 FR IM (CATHETERS) ×2 IMPLANT
CATH INFINITI 5FR AL1 (CATHETERS) ×1 IMPLANT
CATH INFINITI 5FR MULTPACK ANG (CATHETERS) ×2 IMPLANT
CATH LAUNCHER 5F EBU3.5 (CATHETERS) ×2 IMPLANT
DEVICE RAD COMP TR BAND LRG (VASCULAR PRODUCTS) ×2 IMPLANT
GLIDESHEATH SLEND SS 6F .021 (SHEATH) ×2 IMPLANT
GUIDEWIRE .025 260CM (WIRE) ×2 IMPLANT
GUIDEWIRE INQWIRE 1.5J.035X260 (WIRE) ×1 IMPLANT
INQWIRE 1.5J .035X260CM (WIRE) ×2
KIT HEART LEFT (KITS) ×2 IMPLANT
PACK CARDIAC CATHETERIZATION (CUSTOM PROCEDURE TRAY) ×2 IMPLANT
SHEATH GLIDE SLENDER 4/5FR (SHEATH) ×2 IMPLANT
SHEATH PINNACLE 5F 10CM (SHEATH) ×1 IMPLANT
TRANSDUCER W/STOPCOCK (MISCELLANEOUS) ×2 IMPLANT
TUBING CIL FLEX 10 FLL-RA (TUBING) ×2 IMPLANT
WIRE AMPLATZ SS-J .035X260CM (WIRE) ×1 IMPLANT

## 2018-04-05 NOTE — Progress Notes (Signed)
ANTICOAGULATION CONSULT NOTE-follow up  Pharmacy Consult for Heparin Indication: Bridge while Apixaban on hold for hx Afib/CVA  Allergies  Allergen Reactions  . No Known Allergies     Patient Measurements: Height: 5\' 11"  (180.3 cm) Weight: 188 lb 11.2 oz (85.6 kg) IBW/kg (Calculated) : 75.3 Heparin Dosing Weight: 85.5 kg  Vital Signs: Temp: 98.7 F (37.1 C) (08/07 0525) Temp Source: Oral (08/07 0525) BP: 163/80 (08/07 0525) Pulse Rate: 58 (08/07 0525)  Labs: Recent Labs    04/02/18 1926 04/03/18 0459 04/04/18 0419 04/04/18 0901 04/04/18 1942 04/05/18 0443  HGB 14.1 12.1* 13.0  --   --  13.1  HCT 44.1 37.3* 39.6  --   --  40.0  PLT 113* 92* 94*  --   --  110*  APTT  --   --   --  44* 64* 94*  LABPROT 15.2  --   --   --   --   --   INR 1.21  --   --   --   --   --   HEPARINUNFRC  --   --   --  2.08*  --  0.95*  CREATININE 1.05 0.98 0.97  --   --  1.23  TROPONINI 0.03*  --   --   --   --   --     Estimated Creatinine Clearance: 40.8 mL/min (by C-G formula based on SCr of 1.23 mg/dL).   Medical History: Past Medical History:  Diagnosis Date  . BPH (benign prostatic hyperplasia)   . CAD (coronary artery disease)    3 vessel CAD, s/p CABG 04/1998  . Carotid artery disease ([place])    a. s/p R CEA   . Dyslipidemia   . Essential hypertension   . GAD (generalized anxiety disorder)   . Intestinal infection due to Clostridium difficile 03/2010  . PAF (paroxysmal atrial fibrillation) (HCC)    a. on Eliquis  . Presence of permanent cardiac pacemaker   . PVD (peripheral vascular disease) ([place])   . Severe aortic stenosis   . Ulcerative colitis     Assessment: 60 YOM who presented on 8/4 with SOB. Cardiology has been consulted and appears to be planning a cardiac cath for evaluation. The patient was on apixaban PTA for hx Afib/CVA - pharmacy consulted to transition to Heparin.  Last Apixaban dose on 8/5 @ 2200.  aPTT = 94 on heparin 1200 units/hr, therapeutic.  No  bleeding noted. HL =0.95 still falsely elevated due to recent apixaban dosing, thus will continue to use aPTT values to monitor heparin. This morning the Hgb/Hct remains  wnl, pltc improved to 110k.  Goal of Therapy:  Heparin level 0.3-0.7 units/ml aPTT 66-102 seconds Monitor platelets by anticoagulation protocol: Yes   Plan:  Continue IV Heparin to 1200 units/hr (12 ml/hr) Daily aPTT and HL until correlated. Will continue to monitor for any signs/symptoms of bleeding.  Thank you for allowing pharmacy to be a part of this patient's care. [name], RPh Clinical Pharmacist Pager: [phone] Clinical phone for 04/05/2018 from 7a-3:30p: [phone] If after 3:30p, please call main pharmacy at: [phone] Please check AMION for all [place] numbers 04/05/2018 8:40 AM

## 2018-04-05 NOTE — Progress Notes (Signed)
ANTICOAGULATION CONSULT NOTE-follow up  Pharmacy Consult for Heparin Indication: [place] while Apixaban on hold for hx Afib/CVA  Allergies  Allergen Reactions  . No Known Allergies     Patient Measurements: Height: 5\' 11"  (180.3 cm) Weight: 188 lb 11.2 oz (85.6 kg) IBW/kg (Calculated) : 75.3 Heparin Dosing Weight: 85.5 kg  Vital Signs: Temp: 98.7 F (37.1 C) (08/07 0525) Temp Source: Oral (08/07 0525) BP: 154/31 (08/07 1116) Pulse Rate: 60 (08/07 1116)  Labs: Recent Labs    04/02/18 1926 04/03/18 0459 04/04/18 0419 04/04/18 0901 04/04/18 1942 04/05/18 0443  HGB 14.1 12.1* 13.0  --   --  13.1  HCT 44.1 37.3* 39.6  --   --  40.0  PLT 113* 92* 94*  --   --  110*  APTT  --   --   --  44* 64* 94*  LABPROT 15.2  --   --   --   --   --   INR 1.21  --   --   --   --   --   HEPARINUNFRC  --   --   --  2.08*  --  0.95*  CREATININE 1.05 0.98 0.97  --   --  1.23  TROPONINI 0.03*  --   --   --   --   --     Estimated Creatinine Clearance: 40.8 mL/min (by C-G formula based on SCr of 1.23 mg/dL).   Medical History: Past Medical History:  Diagnosis Date  . BPH (benign prostatic hyperplasia)   . CAD (coronary artery disease)    3 vessel CAD, s/p CABG 04/1998  . Carotid artery disease ([place])    a. s/p R CEA   . Dyslipidemia   . Essential hypertension   . GAD (generalized anxiety disorder)   . Intestinal infection due to Clostridium difficile 03/2010  . PAF (paroxysmal atrial fibrillation) ([facility])    a. on Eliquis  . Presence of permanent cardiac pacemaker   . PVD (peripheral vascular disease) ([place])   . Severe aortic stenosis   . Ulcerative colitis     Assessment: 89 YOM who presented on 8/4 with SOB. Cardiology has been consulted and appears to be planning a cardiac cath for evaluation. The patient was on apixaban PTA for hx Afib/CVA - pharmacy consulted to transition to Heparin.  Last Apixaban dose on 8/5 @ 2200.  This AM the aPTT = 94 therapeutic on heparin 1200  units/hr H/H remains  wnl, pltc improved to 110k. Monitor aPTT for now until effect of apixaban on heparin levels has gone.  Now s/p cath:    Pharmacy consulted to restart heparin 8 hr post sheath removal. Left radial artery/right brachial vein sheath removed at 11:05 & 11:08, no bleeding or hematoma noted. TR band on left radial artery.    Goal of Therapy:  Heparin level 0.3-0.7 units/ml aPTT 66-102 seconds Monitor platelets by anticoagulation protocol: Yes   Plan:  Restart IV heparin drip 1200 units/hr tonight at 19:15 if still no bleeding or hematoma.  F/u aPTT/HL 8 hours post restart.  Daily aPTT/HL  Will continue to monitor for any signs/symptoms of bleeding.  Thank you for allowing pharmacy to be a part of this patient's care. Dalton Townsend, RPh Clinical Pharmacist Pager: [phone] Clinical phone for 04/05/2018 from 7a-3:30p: [phone] If after 3:30p, please call main pharmacy at: [phone] Please check AMION for all [facility] numbers 04/05/2018 11:19 AM

## 2018-04-05 NOTE — Progress Notes (Signed)
Patient ID: Dalton Townsend, male   DOB: 07-24-1923, 84 y.o.   MRN: [id number]  Cardiac cath today shows all bypass grafts patent. Normal right heart pressures. Gated cardiac CTA shows anatomy that is suitable for transcatheter aortic valve replacement with no complicating features.  The CTA of the chest, abdomen, and pelvis shows diffuse segmental calcification of the aorta, iliacs, femoral vessels.  I think the right femoral artery is suitable for transfemoral access.  I discussed the studies with the patient and his family.  All their questions have been answered.  He has tentatively been scheduled for transcatheter aortic valve replacement on Tuesday, 04/18/2018.  We will need to get him up ambulating tomorrow and be sure that he is independent and safe to go home.

## 2018-04-05 NOTE — Progress Notes (Signed)
Patient went to cardiac cath lab.

## 2018-04-05 NOTE — Progress Notes (Signed)
PROGRESS NOTE    Dalton Townsend  CNO:[id number] DOB: 1922/12/26 DOA: 04/02/2018 PCP: Lajean Manes, MD   Brief Narrative: 84 year old man with medical problems including severe aortic stenosis, diastolic heart failure, atrial fibrillation on anticoagulation with DOAC, coronary artery disease status post CABG, ulcerative colitis under good control who presents with shortness of breath. 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 ED Course:in the emergency room vital signs remarkable for tachycardia with a heart rate between 110 and 120, O2 sat nadir of 87% Chest x-ray revealed increasing interstitial prominence seen with atypical infection or poor edema with right lung base consolidation, trace pleural effusions, stable cardiac megaly. Troponin within normal limits at 0.03  Assessment & Plan:  1] critical aortic stenosis with acute on chronic diastolic CHF -improving with diuresis -Repeat echocardiogram and noted severe to critical aortic stenosis with preserved ejection fraction peak gradient of 88 and transvalvular gradient of 52 mmHg -Cardiology consulted -Currently undergoing evaluation for TAVR -CVTS consult appreciated -Cozaar and lasix on hold now for contrast, cath  2. P.Afib -V paced -Eliquis held, continue hep gtt, metoprolol  3. depression continue Lexapro  4. BPH continue Proscar  5. hypokalemia due to diuretics and replete and recheck.    DVT prophylaxis: Heparin gtt Code Status: Full code Family Communication: Disposition Plan: per Cards   Consultants: Cardiology   Procedures: None Antimicrobials none  Subjective: breathing ok, seen in cath lab Objective: Vitals:   04/05/18 1400 04/05/18 1415 04/05/18 1430 04/05/18 1445  BP: (!) 150/38 (!) 148/40 (!) 149/57 (!) 155/77  Pulse: (!) 57 60 (!) 32 (!) 59  Resp: 19  19 15  (!) 23  Temp:      TempSrc:      SpO2: 94% 97% 96% 93%  Weight:      Height:        Intake/Output Summary (Last 24 hours) at 04/05/2018 1555 Last data filed at 04/05/2018 0200 Gross per 24 hour  Intake 2540 ml  Output 500 ml  Net 2040 ml   Filed Weights   04/03/18 0135 04/04/18 0700 04/04/18 2207  Weight: 87.9 kg (193 lb 12.6 oz) 85.5 kg (188 lb 7.9 oz) 85.6 kg (188 lb 11.2 oz)    Examination: Gen: Awake, Alert, Oriented X 3,  HEENT: PERRLA, Neck supple, no JVD Lungs: CTAB CVS: loud SEM Abd: soft, Non tender, non distended, BS present Extremities: No Cyanosis, Clubbing or edema Skin: no new rashes    Data Reviewed: I have personally reviewed following labs and imaging studies  CBC: Recent Labs  Lab 04/02/18 1926 04/03/18 0459 04/04/18 0419 04/05/18 0443  WBC 6.4 3.6* 3.9* 5.0  HGB 14.1 12.1* 13.0 13.1  HCT 44.1 37.3* 39.6 40.0  MCV 95.5 94.0 93.4 94.6  PLT 113* 92* 94* 294*   Basic Metabolic Panel: Recent Labs  Lab 04/02/18 1926 04/03/18 0459 04/04/18 0419 04/05/18 0443  NA 132* 138 137 137  K 3.8 3.3* 3.4* 3.7  CL 96* 104 100 101  CO2 23 25 25 26   GLUCOSE 142* 112* 117* 106*  BUN 20 21 23  32*  CREATININE 1.05 0.98 0.97 1.23  CALCIUM 9.1 8.8* 8.7* 8.9  MG  --   --   --  2.0   GFR: Estimated Creatinine Clearance: 40.8 mL/min (by C-G formula based on SCr of 1.23 mg/dL). Liver Function Tests: No  results for input(s): AST, ALT, ALKPHOS, BILITOT, PROT, ALBUMIN in the last 168 hours. No results for input(s): LIPASE, AMYLASE in the last 168 hours. No results for input(s): AMMONIA in the last 168 hours. Coagulation Profile: Recent Labs  Lab 04/02/18 1926  INR 1.21   Cardiac Enzymes: Recent Labs  Lab 04/02/18 1926  TROPONINI 0.03*   BNP (last 3 results) No results for input(s): PROBNP in the last 8760 hours. HbA1C: No results for input(s): HGBA1C in the last 72 hours. CBG: No results for input(s): GLUCAP in the last 168 hours. Lipid  Profile: No results for input(s): CHOL, HDL, LDLCALC, TRIG, CHOLHDL, LDLDIRECT in the last 72 hours. Thyroid Function Tests: No results for input(s): TSH, T4TOTAL, FREET4, T3FREE, THYROIDAB in the last 72 hours. Anemia Panel: No results for input(s): VITAMINB12, FOLATE, FERRITIN, TIBC, IRON, RETICCTPCT in the last 72 hours. Sepsis Labs: No results for input(s): PROCALCITON, LATICACIDVEN in the last 168 hours.  No results found for this or any previous visit (from the past 240 hour(s)).       Radiology Studies: Ct Coronary Morph W/cta Cor W/score W/ca W/cm &/or Wo/cm  Addendum Date: 04/05/2018   ADDENDUM REPORT: 04/05/2018 14:16 CLINICAL DATA:  84 year old male with severe symptomatic aortic stenosis being evaluated for a TAVR procedure. EXAM: Cardiac TAVR CT TECHNIQUE: The patient was scanned on a [employer]. A 120 kV retrospective scan was triggered in the descending thoracic aorta at 111 HU's. Gantry rotation speed was 250 msecs and collimation was .6 mm. No beta blockade or nitro were given. The 3D data set was reconstructed in 5% intervals of the R-R cycle. Systolic and diastolic phases were analyzed on a dedicated work station using MPR, MIP and VRT modes. The patient received 80 cc of contrast. FINDINGS: Aortic Valve: Trileaflet aortic valve with severely thickened and calcified leaflets and moderately impaired leaflet opening. There are only trivial calcifications extending into the LVOT. Aorta: Normal size, mild calcifications in the ascending aorta, but severe calcifications and atheroma in the descending aorta. No dissection. Sinotubular Junction: 29 x 28 mm Ascending Thoracic Aorta: 33 x 32 mm Aortic Arch: 31 x 28 mm Descending Thoracic Aorta: 27 x 25 mm Sinus of Valsalva Measurements: Non-coronary: 36 mm Right -coronary: 32 mm Left -coronary: 34 mm Coronary Artery Height above Annulus: Left Main: 12 mm Right Coronary: 14 mm Virtual Basal Annulus Measurements: Maximum/Minimum  Diameter: 28.6 x 23.3 mm Mean Diameter: 24.7 mm Perimeter: 80 mm Area: 479 mm Optimum Fluoroscopic Angle for Delivery: LAO 2 CAU 2 IMPRESSION: 1. Trileaflet aortic valve with severely thickened and calcified leaflets and moderately impaired leaflet opening with only trivial calcifications extending into the LVOT. Annular measurements are suitable for delivery of a 26 mm Edwards-SAPIEN 3 valve. 2. Sufficient coronary to annulus distance. 3. Optimum Fluoroscopic Angle for Delivery:  LAO 2 CAU 2 4. No thrombus in the left atrial appendage. Electronically Signed   By: Ena Dawley   On: 04/05/2018 14:16   Result Date: 04/05/2018 EXAM: OVER-READ INTERPRETATION  CT CHEST The following report is an over-read performed by radiologist Dr. Vinnie Langton of [facility], [place] on 04/04/2018. This over-read does not include interpretation of cardiac or coronary anatomy or pathology. The coronary calcium score/coronary CTA interpretation by the cardiologist is attached. COMPARISON:  None. FINDINGS: Extracardiac findings will be described under dictation for contemporaneously obtained CTA chest, abdomen and pelvis. IMPRESSION: Please see separate dictation for contemporaneously obtained CTA chest, abdomen and pelvis dated 04/04/2018 for full description  of relevant extracardiac findings. Electronically Signed: By: Vinnie Langton M.D. On: 04/04/2018 14:50   Ct Angio Chest Aorta W/cm &/or Wo/cm  Result Date: 04/04/2018 CLINICAL DATA:  84 year old male with history of severe aortic stenosis. Preprocedural study prior to potential transcatheter aortic valve replacement (TAVR). EXAM: CT ANGIOGRAPHY CHEST, ABDOMEN AND PELVIS TECHNIQUE: Multidetector CT imaging through the chest, abdomen and pelvis was performed using the standard protocol during bolus administration of intravenous contrast. Multiplanar reconstructed images and MIPs were obtained and reviewed to evaluate the vascular anatomy. CONTRAST:  124m ISOVUE-370  IOPAMIDOL (ISOVUE-370) INJECTION 76% COMPARISON:  CT the abdomen and pelvis 05/14/2010. FINDINGS: CTA CHEST FINDINGS Cardiovascular: Heart size is mildly enlarged. There is no significant pericardial fluid, thickening or pericardial calcification. There is aortic atherosclerosis, as well as atherosclerosis of the great vessels of the mediastinum and the coronary arteries, including calcified atherosclerotic plaque in the left main, left anterior descending, left circumflex and right coronary arteries. Status post median sternotomy for CABG including LIMA to the LAD. Severe thickening calcification of the aortic valve. Left-sided pacemaker with lead tips terminating in the right atrial appendage and near the right ventricular apex. Mediastinum/Lymph Nodes: No pathologically enlarged mediastinal or hilar lymph nodes. Esophagus is unremarkable in appearance. No axillary lymphadenopathy. Lungs/Pleura: Small right and trace left pleural effusions lying dependently. No acute consolidative airspace disease. Two tiny pulmonary nodules are noted in the right lung, largest of which measures 4 mm in the lateral segment of the right middle lobe (axial image 51 of series 15). No larger more suspicious appearing pulmonary nodules or masses are noted. Musculoskeletal/Soft Tissues: There are no aggressive appearing lytic or blastic lesions noted in the visualized portions of the skeleton. Median sternotomy wires. CTA ABDOMEN AND PELVIS FINDINGS Hepatobiliary: 11 mm low-attenuation lesion in segment 4A of the liver, compatible with a simple cyst. No other larger more suspicious appearing hepatic lesions are noted. No intra or extrahepatic biliary ductal dilatation. Gallbladder is moderately distended, but otherwise unremarkable in appearance. Pancreas: No pancreatic mass. No pancreatic ductal dilatation. No pancreatic or peripancreatic fluid or inflammatory changes. Spleen: Unremarkable. Adrenals/Urinary Tract: Multiple  low-attenuation lesions in both kidneys, compatible with simple cysts, measuring up to 19 mm in diameter in the upper and lower poles of the right kidney. 7 mm nonobstructive calculus in the upper pole collecting system of the left kidney. No hydroureteronephrosis. Urinary bladder is unremarkable in appearance. Stomach/Bowel: Normal appearance of the stomach. No pathologic dilatation of small bowel or colon. A few scattered colonic diverticulae are noted, particularly in the sigmoid colon, without surrounding inflammatory changes to suggest an acute diverticulitis at this time. Normal appendix. Vascular/Lymphatic: Aortic atherosclerosis with vascular findings and measurements pertinent to potential TAVR procedure, as detailed below. There is also aneurysmal dilatation of the left common iliac artery which measures up to 2.9 cm in diameter. Separate origin of the common hepatic artery directly off the aorta, with moderate ostial narrowing of this vessel where there is a mean diameter of only 5 mm. Remaining portions of the celiac axis are otherwise widely patent without hemodynamically significant stenosis. There is also moderate narrowing of the ostium of the superior mesenteric artery which also has a mean diameter 5 mm. Inferior mesenteric artery is widely patent without hemodynamically significant stenosis. Two right-sided renal arteries, the larger of which demonstrates very mild ostial narrowing. Single left renal artery is widely patent without hemodynamically significant stenosis. Reproductive: Prostate gland and seminal vesicles are unremarkable in appearance. Other: No significant volume of  ascites.  No pneumoperitoneum. Musculoskeletal: There are no aggressive appearing lytic or blastic lesions noted in the visualized portions of the skeleton. VASCULAR MEASUREMENTS PERTINENT TO TAVR: AORTA: Minimal Aortic Diameter-18 x 16 mm Severity of Aortic Calcification-severe RIGHT PELVIS: Right Common Iliac Artery -  Minimal Diameter-11.7 x 10.4 mm Tortuosity-moderate Calcification-moderate Right External Iliac Artery - Minimal Diameter-8.9 x 8.1 mm Tortuosity-severe Calcification-mild Right Common Femoral Artery - Minimal Diameter-7.8 x 6.6 mm Tortuosity-mild Calcification-severe LEFT PELVIS: Left Common Iliac Artery - Minimal Diameter-10.4 x 5.0 mm Tortuosity-severe Calcification-moderate Left External Iliac Artery - Minimal Diameter-10.0 x 8.5 mm Tortuosity-severe Calcification-mild Left Common Femoral Artery - Minimal Diameter-7.1 x 8.3 mm Tortuosity-mild Calcification-severe Review of the MIP images confirms the above findings. IMPRESSION: 1. Vascular findings and measurements pertinent to potential TAVR procedure, as detailed above. 2. Severe thickening calcification of the aortic valve, compatible with the reported clinical history of severe aortic stenosis. 3. Aortic atherosclerosis, in addition to left main and 3 vessel coronary artery disease. Status post median sternotomy for CABG including LIMA to the LAD. 4. 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 5. Moderate ostial narrowing of the common hepatic artery (direct origin from the abdominal aorta) and the superior mesenteric artery, both of which measure 5 mm in diameter. 6. Colonic diverticulosis without evidence of acute diverticulitis at this time. 7. 7 mm nonobstructive calculus in the upper pole collecting system of left kidney. 8. Small right and trace left pleural effusions lying dependently. 9. Small pulmonary nodules in the right lung, largest of which measures only 4 mm in the right middle lobe. No follow-up needed if patient is low-risk (and has no known or suspected primary neoplasm). Non-contrast chest CT can be considered in 12 months if patient is high-risk. This recommendation follows the consensus statement: Guidelines for Management of Incidental Pulmonary Nodules Detected on CT Images: From the  Fleischner Society 2017; Radiology 2017; 284:228-243. 10. Additional incidental findings, as above. Aortic Atherosclerosis (ICD10-I70.0). Electronically Signed   By: Vinnie Langton M.D.   On: 04/04/2018 18:11   Ct Angio Abd/pel W/ And/or W/o  Result Date: 04/04/2018 CLINICAL DATA:  84 year old male with history of severe aortic stenosis. Preprocedural study prior to potential transcatheter aortic valve replacement (TAVR). EXAM: CT ANGIOGRAPHY CHEST, ABDOMEN AND PELVIS TECHNIQUE: Multidetector CT imaging through the chest, abdomen and pelvis was performed using the standard protocol during bolus administration of intravenous contrast. Multiplanar reconstructed images and MIPs were obtained and reviewed to evaluate the vascular anatomy. CONTRAST:  162m ISOVUE-370 IOPAMIDOL (ISOVUE-370) INJECTION 76% COMPARISON:  CT the abdomen and pelvis 05/14/2010. FINDINGS: CTA CHEST FINDINGS Cardiovascular: Heart size is mildly enlarged. There is no significant pericardial fluid, thickening or pericardial calcification. There is aortic atherosclerosis, as well as atherosclerosis of the great vessels of the mediastinum and the coronary arteries, including calcified atherosclerotic plaque in the left main, left anterior descending, left circumflex and right coronary arteries. Status post median sternotomy for CABG including LIMA to the LAD. Severe thickening calcification of the aortic valve. Left-sided pacemaker with lead tips terminating in the right atrial appendage and near the right ventricular apex. Mediastinum/Lymph Nodes: No pathologically enlarged mediastinal or hilar lymph nodes. Esophagus is unremarkable in appearance. No axillary lymphadenopathy. Lungs/Pleura: Small right and trace left pleural effusions lying dependently. No acute consolidative airspace disease. Two tiny pulmonary nodules are noted in the right lung, largest of which measures 4 mm in the lateral segment of the right middle lobe (axial  image 51 of  series 15). No larger more suspicious appearing pulmonary nodules or masses are noted. Musculoskeletal/Soft Tissues: There are no aggressive appearing lytic or blastic lesions noted in the visualized portions of the skeleton. Median sternotomy wires. CTA ABDOMEN AND PELVIS FINDINGS Hepatobiliary: 11 mm low-attenuation lesion in segment 4A of the liver, compatible with a simple cyst. No other larger more suspicious appearing hepatic lesions are noted. No intra or extrahepatic biliary ductal dilatation. Gallbladder is moderately distended, but otherwise unremarkable in appearance. Pancreas: No pancreatic mass. No pancreatic ductal dilatation. No pancreatic or peripancreatic fluid or inflammatory changes. Spleen: Unremarkable. Adrenals/Urinary Tract: Multiple low-attenuation lesions in both kidneys, compatible with simple cysts, measuring up to 19 mm in diameter in the upper and lower poles of the right kidney. 7 mm nonobstructive calculus in the upper pole collecting system of the left kidney. No hydroureteronephrosis. Urinary bladder is unremarkable in appearance. Stomach/Bowel: Normal appearance of the stomach. No pathologic dilatation of small bowel or colon. A few scattered colonic diverticulae are noted, particularly in the sigmoid colon, without surrounding inflammatory changes to suggest an acute diverticulitis at this time. Normal appendix. Vascular/Lymphatic: Aortic atherosclerosis with vascular findings and measurements pertinent to potential TAVR procedure, as detailed below. There is also aneurysmal dilatation of the left common iliac artery which measures up to 2.9 cm in diameter. Separate origin of the common hepatic artery directly off the aorta, with moderate ostial narrowing of this vessel where there is a mean diameter of only 5 mm. Remaining portions of the celiac axis are otherwise widely patent without hemodynamically significant stenosis. There is also moderate narrowing of the ostium of the  superior mesenteric artery which also has a mean diameter 5 mm. Inferior mesenteric artery is widely patent without hemodynamically significant stenosis. Two right-sided renal arteries, the larger of which demonstrates very mild ostial narrowing. Single left renal artery is widely patent without hemodynamically significant stenosis. Reproductive: Prostate gland and seminal vesicles are unremarkable in appearance. Other: No significant volume of ascites.  No pneumoperitoneum. Musculoskeletal: There are no aggressive appearing lytic or blastic lesions noted in the visualized portions of the skeleton. VASCULAR MEASUREMENTS PERTINENT TO TAVR: AORTA: Minimal Aortic Diameter-18 x 16 mm Severity of Aortic Calcification-severe RIGHT PELVIS: Right Common Iliac Artery - Minimal Diameter-11.7 x 10.4 mm Tortuosity-moderate Calcification-moderate Right External Iliac Artery - Minimal Diameter-8.9 x 8.1 mm Tortuosity-severe Calcification-mild Right Common Femoral Artery - Minimal Diameter-7.8 x 6.6 mm Tortuosity-mild Calcification-severe LEFT PELVIS: Left Common Iliac Artery - Minimal Diameter-10.4 x 5.0 mm Tortuosity-severe Calcification-moderate Left External Iliac Artery - Minimal Diameter-10.0 x 8.5 mm Tortuosity-severe Calcification-mild Left Common Femoral Artery - Minimal Diameter-7.1 x 8.3 mm Tortuosity-mild Calcification-severe Review of the MIP images confirms the above findings. IMPRESSION: 1. Vascular findings and measurements pertinent to potential TAVR procedure, as detailed above. 2. Severe thickening calcification of the aortic valve, compatible with the reported clinical history of severe aortic stenosis. 3. Aortic atherosclerosis, in addition to left main and 3 vessel coronary artery disease. Status post median sternotomy for CABG including LIMA to the LAD. 4. 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 5. Moderate ostial narrowing of the common hepatic artery  (direct origin from the abdominal aorta) and the superior mesenteric artery, both of which measure 5 mm in diameter. 6. Colonic diverticulosis without evidence of acute diverticulitis at this time. 7. 7 mm nonobstructive calculus in the upper pole collecting system of left kidney. 8. Small right and trace left  pleural effusions lying dependently. 9. Small pulmonary nodules in the right lung, largest of which measures only 4 mm in the right middle lobe. No follow-up needed if patient is low-risk (and has no known or suspected primary neoplasm). Non-contrast chest CT can be considered in 12 months if patient is high-risk. This recommendation follows the consensus statement: Guidelines for Management of Incidental Pulmonary Nodules Detected on CT Images: From the Fleischner Society 2017; Radiology 2017; 284:228-243. 10. Additional incidental findings, as above. Aortic Atherosclerosis (ICD10-I70.0). Electronically Signed   By: Vinnie Langton M.D.   On: 04/04/2018 18:11        Scheduled Meds: . [MAR Hold] amLODipine  10 mg Oral Daily  . [MAR Hold] atorvastatin  20 mg Oral Daily  . [MAR Hold] balsalazide  2,250 mg Oral TID WC  . [MAR Hold] escitalopram  10 mg Oral Daily  . [MAR Hold] finasteride  5 mg Oral Daily  . [MAR Hold] latanoprost  1 drop Both Eyes QHS  . [MAR Hold] mouth rinse  15 mL Mouth Rinse BID  . [MAR Hold] metoprolol succinate  50 mg Oral Daily  . [MAR Hold] sodium chloride flush  3 mL Intravenous Q12H  . sodium chloride flush  3 mL Intravenous Q12H   Continuous Infusions: . sodium chloride    . sodium chloride    . heparin       LOS: 3 days      Domenic Polite, MD [facility]  If 7PM-7AM, please contact night-coverage [email or web address] Password [email or web address] 04/05/2018, 3:55 PM

## 2018-04-05 NOTE — Progress Notes (Signed)
Modesta Messing, RT had deflated TR band per protocol.  TR band had a rebleed.  Bleeding ceased once 12 cc of air was instilled.  Will wait one hour prior to deflating.  Will continue to monitor.

## 2018-04-05 NOTE — Progress Notes (Signed)
PT Cancellation Note  Patient Details Name: Dalton Townsend MRN: [id number] DOB: 01-02-1923   Cancelled Treatment:    Reason Eval/Treat Not Completed: Patient at procedure or test/unavailable. Pt at cath lab.   Will continue to follow acutely.   Leda Roys, SPT Acute Rehab Services  Office #: [phone]  Leda Roys 04/05/2018, 9:34 AM

## 2018-04-05 NOTE — Interval H&P Note (Signed)
History and Physical Interval Note:  04/05/2018 9:37 AM  Domenic Polite  has presented today for cardiac cath with the diagnosis of pre tavr  as  The various methods of treatment have been discussed with the patient and family. After consideration of risks, benefits and other options for treatment, the patient has consented to  Procedure(s): RIGHT/LEFT HEART CATH AND CORONARY/GRAFT ANGIOGRAPHY (N/A) as a surgical intervention .  The patient's history has been reviewed, patient examined, no change in status, stable for surgery.  I have reviewed the patient's chart and labs.  Questions were answered to the patient's satisfaction.    Cath Lab Visit (complete for each Cath Lab visit)  Clinical Evaluation Leading to the Procedure:   ACS: No.  Non-ACS:    Anginal Classification: CCS II  Anti-ischemic medical therapy: Maximal Therapy (2 or more classes of medications)  Non-Invasive Test Results: No non-invasive testing performed  Prior CABG: Previous CABG         Lauree Chandler

## 2018-04-06 ENCOUNTER — Other Ambulatory Visit: Payer: Self-pay | Admitting: Medical

## 2018-04-06 ENCOUNTER — Encounter (HOSPITAL_COMMUNITY): Payer: Self-pay | Admitting: Cardiovascular Disease

## 2018-04-06 DIAGNOSIS — I10 Essential (primary) hypertension: Secondary | ICD-10-CM

## 2018-04-06 DIAGNOSIS — I6521 Occlusion and stenosis of right carotid artery: Secondary | ICD-10-CM

## 2018-04-06 LAB — BASIC METABOLIC PANEL
Anion gap: 9 (ref 5–15)
BUN: 25 mg/dL — ABNORMAL HIGH (ref 8–23)
CALCIUM: 9.3 mg/dL (ref 8.9–10.3)
CO2: 25 mmol/L (ref 22–32)
CREATININE: 0.97 mg/dL (ref 0.61–1.24)
Chloride: 105 mmol/L (ref 98–111)
GLUCOSE: 118 mg/dL — AB (ref 70–99)
Potassium: 4.2 mmol/L (ref 3.5–5.1)
Sodium: 139 mmol/L (ref 135–145)

## 2018-04-06 LAB — CBC
HCT: 39.4 % (ref 39.0–52.0)
Hemoglobin: 12.8 g/dL — ABNORMAL LOW (ref 13.0–17.0)
MCH: 30.5 pg (ref 26.0–34.0)
MCHC: 32.5 g/dL (ref 30.0–36.0)
MCV: 93.8 fL (ref 78.0–100.0)
PLATELETS: 98 10*3/uL — AB (ref 150–400)
RBC: 4.2 MIL/uL — ABNORMAL LOW (ref 4.22–5.81)
RDW: 13.9 % (ref 11.5–15.5)
WBC: 5 10*3/uL (ref 4.0–10.5)

## 2018-04-06 LAB — APTT: aPTT: 64 seconds — ABNORMAL HIGH (ref 24–36)

## 2018-04-06 LAB — HEPARIN LEVEL (UNFRACTIONATED): Heparin Unfractionated: 0.65 IU/mL (ref 0.30–0.70)

## 2018-04-06 MED ORDER — FUROSEMIDE 10 MG/ML IJ SOLN
40.0000 mg | Freq: Two times a day (BID) | INTRAMUSCULAR | Status: DC
  Administered 2018-04-06 (×2): 40 mg via INTRAVENOUS
  Filled 2018-04-06 (×2): qty 4

## 2018-04-06 MED ORDER — FUROSEMIDE 40 MG PO TABS
40.0000 mg | ORAL_TABLET | Freq: Every day | ORAL | Status: DC
  Filled 2018-04-06: qty 1

## 2018-04-06 NOTE — H&P (View-Only) (Signed)
1 Day Post-Op Procedure(s) (LRB): RIGHT/LEFT HEART CATH AND CORONARY/GRAFT ANGIOGRAPHY (N/A) Subjective:  No chest pain or shortness of breath. Ambulated with PT and walked 500 ft in 6 minutes with no rest.  Objective: Vital signs in last 24 hours: Temp:  [97.5 F (36.4 C)-98 F (36.7 C)] 97.5 F (36.4 C) (08/08 1628) Pulse Rate:  [59-61] 59 (08/08 1628) Cardiac Rhythm: Ventricular paced (08/08 1911) Resp:  [16-21] 21 (08/08 1628) BP: (123-167)/(60-79) 136/62 (08/08 1628) SpO2:  [93 %-98 %] 97 % (08/08 1628) Weight:  [84.9 kg] 84.9 kg (08/08 0607)  Hemodynamic parameters for last 24 hours:    Intake/Output from previous day: 08/07 0701 - 08/08 0700 In: 240 [P.O.:240] Out: 900 [Urine:900] Intake/Output this shift: No intake/output data recorded.  General appearance: alert Heart: irregular rate and rhythm, 3/6 systolic murmur Lungs: clear to auscultation bilaterally  Lab Results: Recent Labs    04/05/18 0443 04/06/18 0309  WBC 5.0 5.0  HGB 13.1 12.8*  HCT 40.0 39.4  PLT 110* 98*   BMET:  Recent Labs    04/05/18 0443 04/06/18 0309  NA 137 139  K 3.7 4.2  CL 101 105  CO2 26 25  GLUCOSE 106* 118*  BUN 32* 25*  CREATININE 1.23 0.97  CALCIUM 8.9 9.3    PT/INR: No results for input(s): LABPROT, INR in the last 72 hours. ABG    Component Value Date/Time   PHART 7.410 04/05/2018 1024   HCO3 21.7 04/05/2018 1024   TCO2 23 04/05/2018 1024   ACIDBASEDEF 2.0 04/05/2018 1024   O2SAT 95.0 04/05/2018 1024   CBG (last 3)  No results for input(s): GLUCAP in the last 72 hours.  Assessment/Plan:  Critical aortic stenosis Still working on trying to schedule for next week. He wants to go home tomorrow. If he goes home Eliquis should not be restarted. Apparently family decided against doing neck CTA at this time to evaluate the right carotid stenosis. Vascular surgery feels that this can be done later as outpt.   LOS: 4 days    Gaye Pollack 04/06/2018

## 2018-04-06 NOTE — Consult Note (Addendum)
Requested by:  Nell Range PA-C (Cardiology)  Reason for consultation: R ICA stenosis    History of Present Illness   Dalton Townsend is a 84 y.o. (1922/10/06) male with PMH significant for AS, diastolic heart failure, atrial fibrillation on Eliquis, and CAD with hx of CABG admitted to the hospital with SOB related to AS.  He is seen in consultation for finding of R ICA stenosis 80-99% by duplex as part of work up for TAVR.  Surgical history also significant for R CEA by Dr. 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  He denies any diagnosis of CVA or TIA since R carotid surgery.  He denies any stroke like symptoms including slurring speech, changes in vision, or one sided weakness.  He ambulates with a cane and lives with his wife.  He is able to perform ADLs.  Cardiothoracic surgeon Dr. Cyndia Bent has scheduled TAVR for 04/18/18.  Cardiac cath performed yesterday shows all coronary bypass grafts are patent.  As part of work up for TAVR, CTA abd/pelvis also performed.  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  Patient denies any pain L groin, abd/back.  He denies claudication but walking limited by breathing.  He has no nonhealing wounds BLE.  He is taking a statin daily.   Past Medical History:  Diagnosis Date  . BPH (benign prostatic hyperplasia)   . CAD (coronary artery disease)    3 vessel CAD, s/p CABG 04/1998  . Carotid artery disease ([place])    a. s/p R CEA   . Dyslipidemia   . Essential hypertension   . GAD (generalized anxiety disorder)   . Intestinal infection due to Clostridium difficile 03/2010  . PAF (paroxysmal atrial fibrillation) ([facility])    a. on Eliquis  . Presence of permanent cardiac pacemaker   . PVD (peripheral vascular disease) ([place])   . Severe aortic stenosis   . Ulcerative colitis     Past Surgical History:  Procedure Laterality Date  . CARDIAC CATHETERIZATION  1999  . CAROTID ENDARTERECTOMY Right 2004  . CATARACT EXTRACTION, BILATERAL  2011  . CORONARY ARTERY  BYPASS GRAFT  04/1998   3 vessel CAD  . INCISION AND DRAINAGE OF WOUND Left 05/04/2016   Procedure: SURGICAL PREP FOR GRAFTING LEFT LEG WITH THERA SKIN APPLICATION;  Surgeon: Irene Limbo, MD;  Location: [facility];  Service: Plastics;  Laterality: Left;  . INTRAOPERATIVE ARTERIOGRAM  06/2000  . PACEMAKER INSERTION  03/25/2010  . RIGHT/LEFT HEART CATH AND CORONARY/GRAFT ANGIOGRAPHY N/A 04/05/2018   Procedure: RIGHT/LEFT HEART CATH AND CORONARY/GRAFT ANGIOGRAPHY;  Surgeon: Burnell Blanks, MD;  Location: [facility];  Service: Cardiovascular;  Laterality: N/A;  . TOTAL KNEE ARTHROPLASTY  07/16/09  . TRANSURETHRAL RESECTION OF PROSTATE  1998     Social History   Socioeconomic History  . Marital status: Married    Spouse name: Not on file  . Number of children: 3  . Years of education: Not on file  . Highest education level: Not on file  Occupational History  . Occupation: retired  Scientific laboratory technician  . Financial resource strain: Not on file  . Food insecurity:    Worry: Not on file    Inability: Not on file  . Transportation needs:    Medical: Not on file    Non-medical: Not on file  Tobacco Use  . Smoking status: Former Smoker    Years: 10.00    Types: Cigarettes    Last attempt to quit: 08/30/1952  Years since quitting: 65.6  . Smokeless tobacco: Former Network engineer and Sexual Activity  . Alcohol use: No  . Drug use: No  . Sexual activity: Not on file  Lifestyle  . Physical activity:    Days per week: Not on file    Minutes per session: Not on file  . Stress: Not on file  Relationships  . Social connections:    Talks on phone: Not on file    Gets together: Not on file    Attends religious service: Not on file    Active member of club or organization: Not on file    Attends meetings of clubs or organizations: Not on file    Relationship status: Not on file  . Intimate partner violence:    Fear of current or ex partner: Not on file    Emotionally abused: Not  on file    Physically abused: Not on file    Forced sexual activity: Not on file  Other Topics Concern  . Not on file  Social History Narrative  . Not on file    Family History  Problem Relation Age of Onset  . Congestive Heart Failure Mother   . Leukemia Father   . Leukemia Unknown   . Heart failure Unknown   . Cancer Unknown     Current Facility-Administered Medications  Medication Dose Route Frequency Provider Last Rate Last Dose  . 0.9 %  sodium chloride infusion  250 mL Intravenous PRN Burnell Blanks, MD      . amLODipine (NORVASC) tablet 10 mg  10 mg Oral Daily Burnell Blanks, MD   10 mg at 04/05/18 1941  . atorvastatin (LIPITOR) tablet 20 mg  20 mg Oral Daily Burnell Blanks, MD   20 mg at 04/05/18 1941  . balsalazide (COLAZAL) capsule 2,250 mg  2,250 mg Oral TID WC Burnell Blanks, MD   2,250 mg at 04/05/18 2035  . escitalopram (LEXAPRO) tablet 10 mg  10 mg Oral Daily Burnell Blanks, MD   10 mg at 04/05/18 2035  . finasteride (PROSCAR) tablet 5 mg  5 mg Oral Daily Burnell Blanks, MD   5 mg at 04/05/18 1941  . heparin ADULT infusion 100 units/mL (25000 units/259m sodium chloride 0.45%)  1,200 Units/hr Intravenous Continuous MBurnell Blanks MD 12 mL/hr at 04/05/18 1946 1,200 Units/hr at 04/05/18 1946  . latanoprost (XALATAN) 0.005 % ophthalmic solution 1 drop  1 drop Both Eyes QHS MBurnell Blanks MD   1 drop at 04/05/18 2034  . MEDLINE mouth rinse  15 mL Mouth Rinse BID MBurnell Blanks MD   15 mL at 04/04/18 2248  . metoprolol succinate (TOPROL-XL) 24 hr tablet 50 mg  50 mg Oral Daily MBurnell Blanks MD   50 mg at 04/05/18 1941  . sodium chloride flush (NS) 0.9 % injection 3 mL  3 mL Intravenous Q12H MBurnell Blanks MD   3 mL at 04/04/18 0934  . sodium chloride flush (NS) 0.9 % injection 3 mL  3 mL Intravenous Q12H MBurnell Blanks MD   3 mL at 04/05/18 2039  . sodium  chloride flush (NS) 0.9 % injection 3 mL  3 mL Intravenous PRN MBurnell Blanks MD        Allergies  Allergen Reactions  . No Known Allergies     REVIEW OF SYSTEMS (negative unless checked):   Cardiac:  []  Chest pain or chest pressure? [x]  Shortness of breath  upon activity? []  Shortness of breath when lying flat? [x]  Irregular heart rhythm?  Vascular:  []  Pain in calf, thigh, or hip brought on by walking? []  Pain in feet at night that wakes you up from your sleep? []  Blood clot in your veins? []  Leg swelling?  Pulmonary:  []  Oxygen at home? []  Productive cough? []  Wheezing?  Neurologic:  []  Sudden weakness in arms or legs? []  Sudden numbness in arms or legs? []  Sudden onset of difficult speaking or slurred speech? []  Temporary loss of vision in one eye? []  Problems with dizziness?  Gastrointestinal:  []  Blood in stool? []  Vomited blood?  Genitourinary:  []  Burning when urinating? []  Blood in urine?  Psychiatric:  []  Major depression  Hematologic:  []  Bleeding problems? []  Problems with blood clotting?  Dermatologic:  []  Rashes or ulcers?  Constitutional:  []  Fever or chills?  Ear/Nose/Throat:  []  Change in hearing? []  Nose bleeds? []  Sore throat?  Musculoskeletal:  []  Back pain? [x]  Joint pain? []  Muscle pain?   For VQI Use Only   PRE-ADM LIVING Home  AMB STATUS Ambulatory with Assistance  CAD Sx None  PRIOR CHF Mild  STRESS TEST No    Physical Examination     Vitals:   04/05/18 2000 04/05/18 2100 04/06/18 0607 04/06/18 0702  BP: (!) 155/59 (!) 142/79 (!) 167/64 (!) 152/60  Pulse: 61  60 60  Resp: (!) 22 16 18  (!) 21  Temp: 98.3 F (36.8 C)  98 F (36.7 C) 97.9 F (36.6 C)  TempSrc: Oral  Oral   SpO2: 97%  93% 96%  Weight:   84.9 kg   Height:       Body mass index is 26.11 kg/m.  General Alert, O x 3, WD, Elderly  Head [place]/AT,    Neck Supple, mid-line trachea,  rotation at neck full, some limitation in extension  and flexion  Pulmonary Sym exp, good B air movt,   Cardiac regular with ectopy,   Vascular Vessel Right Left  Radial Palpable Palpable  Brachial Palpable Palpable  Carotid Palpable, bruit likely related to AS Palpable, bruit likely AS  Aorta Not palpable N/A  Femoral Palpable Palpable  Popliteal Not palpable Not palpable  PT Not palpable Not palpable  DP Not palpable Not palpable    Gastro- intestinal soft, non-distended, non-tender to palpation,   Musculo- skeletal M/S 5/5 throughout  , Extremities without ischemic changes  , No edema present, No visible varicosities , No Lipodermatosclerosis present; feet are symmetrically warm to touch with good capillary refill  Neurologic Cranial nerves 2-12 intact , motor as noted above  Psychiatric Judgement intact, Mood & affect appropriate for pt's clinical situation  Dermatologic See M/S exam for extremity exam, No rashes otherwise noted  Lymphatic  Palpable lymph nodes: None    Non-invasive Vascular Imaging     B Carotid Duplex (04/04/18):   R ICA stenosis:  80-99%  R VA: patent and antegrade  L ICA stenosis:  1-39%  L VA:  patent and antegrade  CTA abd/pelvis (04/04/18) IMPRESSION: 1. Vascular findings and measurements pertinent to potential TAVR procedure, as detailed above. 2. Severe thickening calcification of the aortic valve, compatible with the reported clinical history of severe aortic stenosis. 3. Aortic atherosclerosis, in addition to left main and 3 vessel coronary artery disease. Status post median sternotomy for CABG including LIMA to the LAD. 4. 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 5.  Moderate ostial narrowing of the common hepatic artery (direct origin from the abdominal aorta) and the superior mesenteric artery, both of which measure 5 mm in diameter. 6. Colonic diverticulosis without evidence of acute diverticulitis at this time. 7. 7 mm nonobstructive  calculus in the upper pole collecting system of left kidney. 8. Small right and trace left pleural effusions lying dependently. 9. Small pulmonary nodules in the right lung, largest of which measures only 4 mm in the right middle lobe.  10. Additional incidental findings, as above.   Laboratory   CBC CBC Latest Ref Rng & Units 04/06/2018 04/05/2018 04/04/2018  WBC 4.0 - 10.5 K/uL 5.0 5.0 3.9(L)  Hemoglobin 13.0 - 17.0 g/dL 12.8(L) 13.1 13.0  Hematocrit 39.0 - 52.0 % 39.4 40.0 39.6  Platelets 150 - 400 K/uL 98(L) 110(L) 94(L)    BMP BMP Latest Ref Rng & Units 04/06/2018 04/05/2018 04/04/2018  Glucose 70 - 99 mg/dL 118(H) 106(H) 117(H)  BUN 8 - 23 mg/dL 25(H) 32(H) 23  Creatinine 0.61 - 1.24 mg/dL 0.97 1.23 0.97  Sodium 135 - 145 mmol/L 139 137 137  Potassium 3.5 - 5.1 mmol/L 4.2 3.7 3.4(L)  Chloride 98 - 111 mmol/L 105 101 100  CO2 22 - 32 mmol/L 25 26 25   Calcium 8.9 - 10.3 mg/dL 9.3 8.9 8.7(L)    Coagulation Lab Results  Component Value Date   INR 1.21 04/02/2018   INR 1.27 05/15/2010   INR 1.13 05/14/2010   No results found for: PTT  Lipids No results found for: CHOL, TRIG, HDL, CHOLHDL, VLDL, LDLCALC, LDLDIRECT   Medical Decision Making   Dalton Townsend is a 84 y.o. male who presents with: 1.  Recurrent R ICA stenosis 80-99% s/p R CEA by Dr. Amedeo Plenty in 2001 2.  Acute on CHF,  3.  Severe AS,  4.  PAF,  5.  Sick sinus syndrome s/p PPM,  6.  CAD s/p CABG 7.  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  This was discussed with on call vascular surgeon Dr. Bridgett Larsson who will evaluate the patient later today  Dagoberto Ligas, PA-C [facility]: [phone]  04/06/2018, 9:51 AM  Addendum  I have independently interviewed  and examined the patient, and I agree with the physician assistant's findings.     First, I would get CTA Neck to verify the recurrent stenosis.  Unfortunately, I have had some false positives from carotid duplex, so I routinely confirm the degree of stenosis.  The CTA Neck also provide anatomic information that can influence the choice of procedures.  This patient is obviously at high cardiac risk for any carotid intervention given the multiple active cardiac issues, so TAVR should probably be done first.  Recurrent carotid stenoses are usually due to neointimal hyperplasia rather than plaque or thrombosis., making them reportedly less dangerous.  Most recurrent stenoses are managed with carotid stenting as redo CEA has a high rate of cranial nerve injury.    With the advent of TCAR, there is now a carotid stenting technique with similar stroke rates to CEA.  The CTA will be essential to determining his eligibility for such.  Appears renal function is intact despite significant contrast load two days ago.  Will go ahead with CTA Neck.  The patient is aware of the nephrotoxic nature of the CTA.  Data previously presented by the Vascular Low [facility] Disease [employer], suggests holding off on interventions on CIA aneurysm < 3.5 cm is acceptable.  Further recommendations pending CTA Neck.  This patient will follow up with Dr. Donzetta Matters for any subsequent intervention, as I will be transitioning to a different practice.   Adele Barthel, MD, FACS [facility] [place] Office: [phone] Pager: [phone]  04/06/2018, 11:43 AM

## 2018-04-06 NOTE — Care Management Note (Signed)
Case Management Note  Patient Details  Name: Dalton Townsend MRN: [id number] Date of Birth: [birth year]  Subjective/Objective:   From home with wife, s/p cath, patient is for TAVR work up, plan for TAVR on 8/20.                  Action/Plan: NCM will follow for transition of care needs.   Expected Discharge Date:                  Expected Discharge Plan:  Home/Self Care  In-House Referral:     Discharge planning Services  CM Consult  Post Acute Care Choice:    Choice offered to:     DME Arranged:    DME Agency:     HH Arranged:    [place] Agency:     Status of Service:  Completed, signed off  If discussed at [employer] of Stay Meetings, dates discussed:    Additional Comments:  Zenon Mayo, RN 04/06/2018, 9:21 AM

## 2018-04-06 NOTE — Progress Notes (Signed)
Physical Therapy Treatment Patient Details Name: Dalton Townsend MRN: [id number] DOB: 1922-11-11 Today's Date: 04/06/2018    History of Present Illness 84 yo male with PMH of CAD s/p CABG, severe AS, SSS s/p PPM placed 7/11, HTN, HL, anemia, and CVA who presented with acute shortness of breath and found to have volume overload.     PT Comments    Patient is doing well with therapy today, participating in 6 minute walk test without rest break. VSS on RA, HRmax 107 this visit.  Pt is min guard for all mobility at this time. Please see Pre-TAVR assesment below.    6 Minute Walk Test  Distance - 500 ft      Pre    Post  BP     127/57           143/82  HR    60    110  SaO2    95 RA   94 RA  Modified Borg Dyspnea 6    9  RPE    1             3    5  Meter Walk Test  Trial   1) 14.73 sec   2) 10.33 sec  3) 11.84 sec  Average - 12.3 sec = 1.46 ft/sec   Clinical Frailty Scale - 4            Follow Up Recommendations  No PT follow up;Supervision for mobility/OOB     Equipment Recommendations  None recommended by PT    Recommendations for Other Services       Precautions / Restrictions Precautions Precautions: None Restrictions Weight Bearing Restrictions: No    Mobility  Bed Mobility               General bed mobility comments: OOB in chair   Transfers Overall transfer level: Needs assistance Equipment used: Rolling walker (2 wheeled) Transfers: Sit to/from Stand Sit to Stand: Min guard         General transfer comment: min guard to power up and steady with RW, decreased eccentric control with sitting EoB after ambulation   Ambulation/Gait Ambulation/Gait assistance: Min guard Gait Distance (Feet): 700 Feet Assistive device: Rolling walker (2 wheeled) Gait Pattern/deviations: Shuffle;Decreased stride length;Step-through pattern;Trunk flexed Gait velocity: decreased   General Gait Details: pt performing 6 minute walk test  without break, HRmax 110, RW, no overt LOB   Stairs             Wheelchair Mobility    Modified Rankin (Stroke Patients Only)       Balance Overall balance assessment: No apparent balance deficits (not formally assessed)                                          Cognition Arousal/Alertness: Awake/alert Behavior During Therapy: WFL for tasks assessed/performed Overall Cognitive Status: Within Functional Limits for tasks assessed                                        Exercises      General Comments        Pertinent Vitals/Pain Pain Assessment: No/denies pain    Home Living  Prior Function            PT Goals (current goals can now be found in the care plan section) Acute Rehab PT Goals Patient Stated Goal: go home soon PT Goal Formulation: With patient Time For Goal Achievement: 04/18/18 Potential to Achieve Goals: Good Progress towards PT goals: Progressing toward goals    Frequency    Min 3X/week      PT Plan Current plan remains appropriate    Co-evaluation              AM-PAC PT "6 Clicks" Daily Activity  Outcome Measure  Difficulty turning over in bed (including adjusting bedclothes, sheets and blankets)?: A Little Difficulty moving from lying on back to sitting on the side of the bed? : A Lot Difficulty sitting down on and standing up from a chair with arms (e.g., wheelchair, bedside commode, etc,.)?: Unable Help needed moving to and from a bed to chair (including a wheelchair)?: A Little Help needed walking in hospital room?: A Little Help needed climbing 3-5 steps with a railing? : A Lot 6 Click Score: 14    End of Session Equipment Utilized During Treatment: Gait belt Activity Tolerance: Patient tolerated treatment well Patient left: in bed;with call bell/phone within reach;with family/visitor present Nurse Communication: Mobility status PT Visit Diagnosis:  Muscle weakness (generalized) (M62.81);Other abnormalities of gait and mobility (R26.89)     Time: 6063-0160 PT Time Calculation (min) (ACUTE ONLY): 45 min  Charges:  $Gait Training: 23-37 mins                     Reinaldo Berber, PT, DPT Acute Rehab Services Pager: [phone]     Reinaldo Berber 04/06/2018, 2:40 PM

## 2018-04-06 NOTE — Progress Notes (Addendum)
   Daily Progress Note  Reportedly family reluctant to proceed with CTA neck currently.  From a vascular viewpoint, the priority wound be the severely stenotic aortic valve, so patient can have that CTA neck done as an outpatient if that is the family's preference.  - Patient will follow up with Dr. Donzetta Matters as an outpatient. - Thank you for the consult.  Adele Barthel, MD, FACS [facility] [place] Office: [phone] Pager: [phone]  04/06/2018, 3:51 PM

## 2018-04-06 NOTE — Progress Notes (Addendum)
Progress Note  Patient Name: Dalton Townsend Date of Encounter: 04/06/2018  Primary Cardiologist: No primary care provider on file.   Subjective   The patient denies SOB, feels weak, hasn't been out of chair in 2 days.   Inpatient Medications    Scheduled Meds: . amLODipine  10 mg Oral Daily  . atorvastatin  20 mg Oral Daily  . balsalazide  2,250 mg Oral TID WC  . escitalopram  10 mg Oral Daily  . finasteride  5 mg Oral Daily  . furosemide  40 mg Oral Daily  . latanoprost  1 drop Both Eyes QHS  . mouth rinse  15 mL Mouth Rinse BID  . metoprolol succinate  50 mg Oral Daily  . sodium chloride flush  3 mL Intravenous Q12H  . sodium chloride flush  3 mL Intravenous Q12H   Continuous Infusions: . sodium chloride    . heparin 1,200 Units/hr (04/05/18 1946)   PRN Meds: sodium chloride, sodium chloride flush   Vital Signs    Vitals:   04/05/18 2000 04/05/18 2100 04/06/18 0607 04/06/18 0702  BP: (!) 155/59 (!) 142/79 (!) 167/64 (!) 152/60  Pulse: 61  60 60  Resp: (!) 22 16 18  (!) 21  Temp: 98.3 F (36.8 C)  98 F (36.7 C) 97.9 F (36.6 C)  TempSrc: Oral  Oral   SpO2: 97%  93% 96%  Weight:   84.9 kg   Height:        Intake/Output Summary (Last 24 hours) at 04/06/2018 1045 Last data filed at 04/05/2018 2100 Gross per 24 hour  Intake 240 ml  Output 400 ml  Net -160 ml   Filed Weights   04/04/18 0700 04/04/18 2207 04/06/18 8828  Weight: 85.5 kg 85.6 kg 84.9 kg    Telemetry    Atrial sensed V paced rhythm - Personally Reviewed  ECG    Atrial sensed V paced rhythm - Personally Reviewed  Physical Exam   GEN: No acute distress.   Neck: No JVD Cardiac: RRR, no murmurs, rubs, or gallops.  Respiratory: Crackles bilaterally. GI: Soft, nontender, non-distended  MS: No edema; No deformity. Neuro:  Nonfocal  Psych: Normal affect   Labs    Chemistry Recent Labs  Lab 04/04/18 0419 04/05/18 0443 04/06/18 0309  NA 137 137 139  K 3.4* 3.7 4.2  CL 100 101  105  CO2 25 26 25   GLUCOSE 117* 106* 118*  BUN 23 32* 25*  CREATININE 0.97 1.23 0.97  CALCIUM 8.7* 8.9 9.3  GFRNONAA >60 49* >60  GFRAA >60 57* >60  ANIONGAP 12 10 9      Hematology Recent Labs  Lab 04/04/18 0419 04/05/18 0443 04/06/18 0309  WBC 3.9* 5.0 5.0  RBC 4.24 4.23 4.20*  HGB 13.0 13.1 12.8*  HCT 39.6 40.0 39.4  MCV 93.4 94.6 93.8  MCH 30.7 31.0 30.5  MCHC 32.8 32.8 32.5  RDW 13.9 14.3 13.9  PLT 94* 110* 98*    Cardiac Enzymes Recent Labs  Lab 04/02/18 1926  TROPONINI 0.03*    Recent Labs  Lab 04/02/18 1928  TROPIPOC 0.03    BNP Recent Labs  Lab 04/02/18 1927  BNP 1,143.9*    DDimer No results for input(s): DDIMER in the last 168 hours.   Radiology    Ct Coronary Morph W/cta Cor W/score W/ca W/cm &/or Wo/cm  Addendum Date: 04/05/2018   ADDENDUM REPORT: 04/05/2018 14:16 CLINICAL DATA:  84 year old male with severe symptomatic aortic stenosis being evaluated  for a TAVR procedure. EXAM: Cardiac TAVR CT TECHNIQUE: The patient was scanned on a [employer]. A 120 kV retrospective scan was triggered in the descending thoracic aorta at 111 HU's. Gantry rotation speed was 250 msecs and collimation was .6 mm. No beta blockade or nitro were given. The 3D data set was reconstructed in 5% intervals of the R-R cycle. Systolic and diastolic phases were analyzed on a dedicated work station using MPR, MIP and VRT modes. The patient received 80 cc of contrast. FINDINGS: Aortic Valve: Trileaflet aortic valve with severely thickened and calcified leaflets and moderately impaired leaflet opening. There are only trivial calcifications extending into the LVOT. Aorta: Normal size, mild calcifications in the ascending aorta, but severe calcifications and atheroma in the descending aorta. No dissection. Sinotubular Junction: 29 x 28 mm Ascending Thoracic Aorta: 33 x 32 mm Aortic Arch: 31 x 28 mm Descending Thoracic Aorta: 27 x 25 mm Sinus of Valsalva Measurements:  Non-coronary: 36 mm Right -coronary: 32 mm Left -coronary: 34 mm Coronary Artery Height above Annulus: Left Main: 12 mm Right Coronary: 14 mm Virtual Basal Annulus Measurements: Maximum/Minimum Diameter: 28.6 x 23.3 mm Mean Diameter: 24.7 mm Perimeter: 80 mm Area: 479 mm Optimum Fluoroscopic Angle for Delivery: LAO 2 CAU 2 IMPRESSION: 1. Trileaflet aortic valve with severely thickened and calcified leaflets and moderately impaired leaflet opening with only trivial calcifications extending into the LVOT. Annular measurements are suitable for delivery of a 26 mm Edwards-SAPIEN 3 valve. 2. Sufficient coronary to annulus distance. 3. Optimum Fluoroscopic Angle for Delivery:  LAO 2 CAU 2 4. No thrombus in the left atrial appendage. Electronically Signed   By: Ena Dawley   On: 04/05/2018 14:16   Result Date: 04/05/2018 EXAM: OVER-READ INTERPRETATION  CT CHEST The following report is an over-read performed by radiologist Dr. Vinnie Langton of [facility], [place] on 04/04/2018. This over-read does not include interpretation of cardiac or coronary anatomy or pathology. The coronary calcium score/coronary CTA interpretation by the cardiologist is attached. COMPARISON:  None. FINDINGS: Extracardiac findings will be described under dictation for contemporaneously obtained CTA chest, abdomen and pelvis. IMPRESSION: Please see separate dictation for contemporaneously obtained CTA chest, abdomen and pelvis dated 04/04/2018 for full description of relevant extracardiac findings. Electronically Signed: By: Vinnie Langton M.D. On: 04/04/2018 14:50   Ct Angio Chest Aorta W/cm &/or Wo/cm  Result Date: 04/04/2018 CLINICAL DATA:  84 year old male with history of severe aortic stenosis. Preprocedural study prior to potential transcatheter aortic valve replacement (TAVR). EXAM: CT ANGIOGRAPHY CHEST, ABDOMEN AND PELVIS TECHNIQUE: Multidetector CT imaging through the chest, abdomen and pelvis was performed using the standard  protocol during bolus administration of intravenous contrast. Multiplanar reconstructed images and MIPs were obtained and reviewed to evaluate the vascular anatomy. CONTRAST:  158m ISOVUE-370 IOPAMIDOL (ISOVUE-370) INJECTION 76% COMPARISON:  CT the abdomen and pelvis 05/14/2010. FINDINGS: CTA CHEST FINDINGS Cardiovascular: Heart size is mildly enlarged. There is no significant pericardial fluid, thickening or pericardial calcification. There is aortic atherosclerosis, as well as atherosclerosis of the great vessels of the mediastinum and the coronary arteries, including calcified atherosclerotic plaque in the left main, left anterior descending, left circumflex and right coronary arteries. Status post median sternotomy for CABG including LIMA to the LAD. Severe thickening calcification of the aortic valve. Left-sided pacemaker with lead tips terminating in the right atrial appendage and near the right ventricular apex. Mediastinum/Lymph Nodes: No pathologically enlarged mediastinal or hilar lymph nodes. Esophagus is unremarkable in appearance. No axillary lymphadenopathy.  Lungs/Pleura: Small right and trace left pleural effusions lying dependently. No acute consolidative airspace disease. Two tiny pulmonary nodules are noted in the right lung, largest of which measures 4 mm in the lateral segment of the right middle lobe (axial image 51 of series 15). No larger more suspicious appearing pulmonary nodules or masses are noted. Musculoskeletal/Soft Tissues: There are no aggressive appearing lytic or blastic lesions noted in the visualized portions of the skeleton. Median sternotomy wires. CTA ABDOMEN AND PELVIS FINDINGS Hepatobiliary: 11 mm low-attenuation lesion in segment 4A of the liver, compatible with a simple cyst. No other larger more suspicious appearing hepatic lesions are noted. No intra or extrahepatic biliary ductal dilatation. Gallbladder is moderately distended, but otherwise unremarkable in appearance.  Pancreas: No pancreatic mass. No pancreatic ductal dilatation. No pancreatic or peripancreatic fluid or inflammatory changes. Spleen: Unremarkable. Adrenals/Urinary Tract: Multiple low-attenuation lesions in both kidneys, compatible with simple cysts, measuring up to 19 mm in diameter in the upper and lower poles of the right kidney. 7 mm nonobstructive calculus in the upper pole collecting system of the left kidney. No hydroureteronephrosis. Urinary bladder is unremarkable in appearance. Stomach/Bowel: Normal appearance of the stomach. No pathologic dilatation of small bowel or colon. A few scattered colonic diverticulae are noted, particularly in the sigmoid colon, without surrounding inflammatory changes to suggest an acute diverticulitis at this time. Normal appendix. Vascular/Lymphatic: Aortic atherosclerosis with vascular findings and measurements pertinent to potential TAVR procedure, as detailed below. There is also aneurysmal dilatation of the left common iliac artery which measures up to 2.9 cm in diameter. Separate origin of the common hepatic artery directly off the aorta, with moderate ostial narrowing of this vessel where there is a mean diameter of only 5 mm. Remaining portions of the celiac axis are otherwise widely patent without hemodynamically significant stenosis. There is also moderate narrowing of the ostium of the superior mesenteric artery which also has a mean diameter 5 mm. Inferior mesenteric artery is widely patent without hemodynamically significant stenosis. Two right-sided renal arteries, the larger of which demonstrates very mild ostial narrowing. Single left renal artery is widely patent without hemodynamically significant stenosis. Reproductive: Prostate gland and seminal vesicles are unremarkable in appearance. Other: No significant volume of ascites.  No pneumoperitoneum. Musculoskeletal: There are no aggressive appearing lytic or blastic lesions noted in the visualized portions  of the skeleton. VASCULAR MEASUREMENTS PERTINENT TO TAVR: AORTA: Minimal Aortic Diameter-18 x 16 mm Severity of Aortic Calcification-severe RIGHT PELVIS: Right Common Iliac Artery - Minimal Diameter-11.7 x 10.4 mm Tortuosity-moderate Calcification-moderate Right External Iliac Artery - Minimal Diameter-8.9 x 8.1 mm Tortuosity-severe Calcification-mild Right Common Femoral Artery - Minimal Diameter-7.8 x 6.6 mm Tortuosity-mild Calcification-severe LEFT PELVIS: Left Common Iliac Artery - Minimal Diameter-10.4 x 5.0 mm Tortuosity-severe Calcification-moderate Left External Iliac Artery - Minimal Diameter-10.0 x 8.5 mm Tortuosity-severe Calcification-mild Left Common Femoral Artery - Minimal Diameter-7.1 x 8.3 mm Tortuosity-mild Calcification-severe Review of the MIP images confirms the above findings. IMPRESSION: 1. Vascular findings and measurements pertinent to potential TAVR procedure, as detailed above. 2. Severe thickening calcification of the aortic valve, compatible with the reported clinical history of severe aortic stenosis. 3. Aortic atherosclerosis, in addition to left main and 3 vessel coronary artery disease. Status post median sternotomy for CABG including LIMA to the LAD. 4. 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 5. Moderate ostial narrowing of the common hepatic artery (direct origin from the abdominal aorta) and the superior mesenteric  artery, both of which measure 5 mm in diameter. 6. Colonic diverticulosis without evidence of acute diverticulitis at this time. 7. 7 mm nonobstructive calculus in the upper pole collecting system of left kidney. 8. Small right and trace left pleural effusions lying dependently. 9. Small pulmonary nodules in the right lung, largest of which measures only 4 mm in the right middle lobe. No follow-up needed if patient is low-risk (and has no known or suspected primary neoplasm). Non-contrast chest CT can be considered  in 12 months if patient is high-risk. This recommendation follows the consensus statement: Guidelines for Management of Incidental Pulmonary Nodules Detected on CT Images: From the Fleischner Society 2017; Radiology 2017; 284:228-243. 10. Additional incidental findings, as above. Aortic Atherosclerosis (ICD10-I70.0). Electronically Signed   By: Vinnie Langton M.D.   On: 04/04/2018 18:11   Ct Angio Abd/pel W/ And/or W/o  Result Date: 04/04/2018 CLINICAL DATA:  84 year old male with history of severe aortic stenosis. Preprocedural study prior to potential transcatheter aortic valve replacement (TAVR). EXAM: CT ANGIOGRAPHY CHEST, ABDOMEN AND PELVIS TECHNIQUE: Multidetector CT imaging through the chest, abdomen and pelvis was performed using the standard protocol during bolus administration of intravenous contrast. Multiplanar reconstructed images and MIPs were obtained and reviewed to evaluate the vascular anatomy. CONTRAST:  130m ISOVUE-370 IOPAMIDOL (ISOVUE-370) INJECTION 76% COMPARISON:  CT the abdomen and pelvis 05/14/2010. FINDINGS: CTA CHEST FINDINGS Cardiovascular: Heart size is mildly enlarged. There is no significant pericardial fluid, thickening or pericardial calcification. There is aortic atherosclerosis, as well as atherosclerosis of the great vessels of the mediastinum and the coronary arteries, including calcified atherosclerotic plaque in the left main, left anterior descending, left circumflex and right coronary arteries. Status post median sternotomy for CABG including LIMA to the LAD. Severe thickening calcification of the aortic valve. Left-sided pacemaker with lead tips terminating in the right atrial appendage and near the right ventricular apex. Mediastinum/Lymph Nodes: No pathologically enlarged mediastinal or hilar lymph nodes. Esophagus is unremarkable in appearance. No axillary lymphadenopathy. Lungs/Pleura: Small right and trace left pleural effusions lying dependently. No acute  consolidative airspace disease. Two tiny pulmonary nodules are noted in the right lung, largest of which measures 4 mm in the lateral segment of the right middle lobe (axial image 51 of series 15). No larger more suspicious appearing pulmonary nodules or masses are noted. Musculoskeletal/Soft Tissues: There are no aggressive appearing lytic or blastic lesions noted in the visualized portions of the skeleton. Median sternotomy wires. CTA ABDOMEN AND PELVIS FINDINGS Hepatobiliary: 11 mm low-attenuation lesion in segment 4A of the liver, compatible with a simple cyst. No other larger more suspicious appearing hepatic lesions are noted. No intra or extrahepatic biliary ductal dilatation. Gallbladder is moderately distended, but otherwise unremarkable in appearance. Pancreas: No pancreatic mass. No pancreatic ductal dilatation. No pancreatic or peripancreatic fluid or inflammatory changes. Spleen: Unremarkable. Adrenals/Urinary Tract: Multiple low-attenuation lesions in both kidneys, compatible with simple cysts, measuring up to 19 mm in diameter in the upper and lower poles of the right kidney. 7 mm nonobstructive calculus in the upper pole collecting system of the left kidney. No hydroureteronephrosis. Urinary bladder is unremarkable in appearance. Stomach/Bowel: Normal appearance of the stomach. No pathologic dilatation of small bowel or colon. A few scattered colonic diverticulae are noted, particularly in the sigmoid colon, without surrounding inflammatory changes to suggest an acute diverticulitis at this time. Normal appendix. Vascular/Lymphatic: Aortic atherosclerosis with vascular findings and measurements pertinent to potential TAVR procedure, as detailed below. There is also aneurysmal dilatation of  the left common iliac artery which measures up to 2.9 cm in diameter. Separate origin of the common hepatic artery directly off the aorta, with moderate ostial narrowing of this vessel where there is a mean  diameter of only 5 mm. Remaining portions of the celiac axis are otherwise widely patent without hemodynamically significant stenosis. There is also moderate narrowing of the ostium of the superior mesenteric artery which also has a mean diameter 5 mm. Inferior mesenteric artery is widely patent without hemodynamically significant stenosis. Two right-sided renal arteries, the larger of which demonstrates very mild ostial narrowing. Single left renal artery is widely patent without hemodynamically significant stenosis. Reproductive: Prostate gland and seminal vesicles are unremarkable in appearance. Other: No significant volume of ascites.  No pneumoperitoneum. Musculoskeletal: There are no aggressive appearing lytic or blastic lesions noted in the visualized portions of the skeleton. VASCULAR MEASUREMENTS PERTINENT TO TAVR: AORTA: Minimal Aortic Diameter-18 x 16 mm Severity of Aortic Calcification-severe RIGHT PELVIS: Right Common Iliac Artery - Minimal Diameter-11.7 x 10.4 mm Tortuosity-moderate Calcification-moderate Right External Iliac Artery - Minimal Diameter-8.9 x 8.1 mm Tortuosity-severe Calcification-mild Right Common Femoral Artery - Minimal Diameter-7.8 x 6.6 mm Tortuosity-mild Calcification-severe LEFT PELVIS: Left Common Iliac Artery - Minimal Diameter-10.4 x 5.0 mm Tortuosity-severe Calcification-moderate Left External Iliac Artery - Minimal Diameter-10.0 x 8.5 mm Tortuosity-severe Calcification-mild Left Common Femoral Artery - Minimal Diameter-7.1 x 8.3 mm Tortuosity-mild Calcification-severe Review of the MIP images confirms the above findings. IMPRESSION: 1. Vascular findings and measurements pertinent to potential TAVR procedure, as detailed above. 2. Severe thickening calcification of the aortic valve, compatible with the reported clinical history of severe aortic stenosis. 3. Aortic atherosclerosis, in addition to left main and 3 vessel coronary artery disease. Status post median sternotomy for  CABG including LIMA to the LAD. 4. 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 5. Moderate ostial narrowing of the common hepatic artery (direct origin from the abdominal aorta) and the superior mesenteric artery, both of which measure 5 mm in diameter. 6. Colonic diverticulosis without evidence of acute diverticulitis at this time. 7. 7 mm nonobstructive calculus in the upper pole collecting system of left kidney. 8. Small right and trace left pleural effusions lying dependently. 9. Small pulmonary nodules in the right lung, largest of which measures only 4 mm in the right middle lobe. No follow-up needed if patient is low-risk (and has no known or suspected primary neoplasm). Non-contrast chest CT can be considered in 12 months if patient is high-risk. This recommendation follows the consensus statement: Guidelines for Management of Incidental Pulmonary Nodules Detected on CT Images: From the Fleischner Society 2017; Radiology 2017; 284:228-243. 10. Additional incidental findings, as above. Aortic Atherosclerosis (ICD10-I70.0). Electronically Signed   By: Vinnie Langton M.D.   On: 04/04/2018 18:11   Cardiac Studies   LHC: 04/05/2018  Severe triple vessel CAD s/p 6VCABG with 6 patent grafts.  2. Chronic occlusion proximal LAD and proximal Circumflex, mid to distal RCA 3. Severe aortic stenosis by echo.   Patient Profile     85 y.o. male   Assessment & Plan    1. Acute on chronic diastolic HF 2. Severe AS 3. CAD s/p CABG ('99) 4. HTN 5. HL 6. SSS s/p PPM 7. PAF 8. Hypokalemia  The patient underwent TAVR evaluation with TAVR CTAs, LHC showing patent grafts. He was found to have 80% RICA stenosis, VVS to see him this afternoon. The patient appears deconditioned, we will place  PT evaluation, he is also still mildly fluid overloaded, baseline weight 185 lbs, today 188 lbs, I will restart lasix 40 mg iv BID, it was held the last two days  because of CT and cath. Crea is stable and normal. If he is able to walk, we will plan for a discharge tomorrow and admission on Tuesday for TAVR the same day.  For questions or updates, please contact [place] Please consult [email or web address] for contact info under Cardiology/STEMI.     Signed, Ena Dawley, MD  04/06/2018, 10:45 AM

## 2018-04-06 NOTE — Progress Notes (Addendum)
  [facility]   MULTIDISCIPLINARY HEART VALVE TEAM   After discussion with the team, it was decided to proceed with TAVR surgery 04/11/18. He has been restarted on IV lasix by Dr. Meda Coffee. PT consult has been ordered for weakness. If he is euvolemic and able to walk, he may be able to go home tomorrow as he has good family support. We will bring him back Monday for PAT. He should HOLD ELIQUIS at discharge given tentative surgery next Tuesday. [facility] has consulted and proceeding with CTA of neck to confirm severe carotid stenosis. Plan will be possible carotid stenting AFTER TAVR.  Angelena Form PA-C  [facility]  [phone]

## 2018-04-06 NOTE — Progress Notes (Signed)
PROGRESS NOTE    Dalton Townsend  UDJ:[id number] DOB: [birth year] DOA: 04/02/2018 PCP: Lajean Manes, MD   Brief Narrative: 84 year old man with medical problems including severe aortic stenosis, diastolic heart failure, atrial fibrillation on anticoagulation with DOAC, coronary artery disease status post CABG, ulcerative colitis under good control who presents with shortness of breath. 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 ED Course:in the emergency room vital signs remarkable for tachycardia with a heart rate between 110 and 120, O2 sat nadir of 87% Chest x-ray revealed increasing interstitial prominence seen with atypical infection or poor edema with right lung base consolidation, trace pleural effusions, stable cardiac megaly. Troponin within normal limits at 0.03  Assessment & Plan:  1] Critical aortic stenosis with acute on chronic diastolic CHF -improving with diuresis, clinically slightly volume overloaded again -restarted IV Lasix -Repeat echocardiogram and noted severe to critical aortic stenosis with preserved ejection fraction peak gradient of 88 and transvalvular gradient of 52 mmHg -Cardiology consulted, completed evaluation for TAVR with TAVR CTAs and left heart catheterization yesterday which noted and grafts and 80% R ICA stenosis -CVTS consult appreciated -PT consult -hold Eliquis at DC  2. P.Afib -V paced -Eliquis held, continue hep gtt, metoprolol -hold Eliquis at DC for TAVR early next week  3. Recurrent right ICA stenosis 80-99% -History of right CEA in 2001 -Vascular surgery input pending for today, will need CTA neck before planning any intervention -Likely to be done after TAVR  3. depression continue Lexapro  4. BPH continue Proscar  5. Hypokalemia -repleted  DVT prophylaxis: Heparin gtt Code Status:  Full code Family Communication: Disposition Plan: per Cards   Consultants: Cardiology   Procedures: None Antimicrobials none  Subjective:  -feels okay walked with the tack down the hallway, denies any chest pain or dyspnea Objective: Vitals:   04/05/18 2100 04/06/18 0607 04/06/18 0702 04/06/18 1106  BP: (!) 142/79 (!) 167/64 (!) 152/60 123/66  Pulse:  60 60 61  Resp: 16 18 (!) 21 20  Temp:  98 F (36.7 C) 97.9 F (36.6 C) 98 F (36.7 C)  TempSrc:  Oral    SpO2:  93% 96% 98%  Weight:  84.9 kg    Height:        Intake/Output Summary (Last 24 hours) at 04/06/2018 1429 Last data filed at 04/06/2018 0750 Gross per 24 hour  Intake 480 ml  Output 900 ml  Net -420 ml   Filed Weights   04/04/18 0700 04/04/18 2207 04/06/18 0607  Weight: 85.5 kg 85.6 kg 84.9 kg    Examination: Gen: Awake, Alert, Oriented X 3,  HEENT: PERRLA, Neck supple, no JVD Lungs: fine of basilar crackles CVS: S1-S2/regular rate rhythm, loud systolic ejection murmur Abd: soft, Non tender, non distended, BS present Extremities: No Cyanosis, Clubbing or edema Skin: no new rashes  Data Reviewed: I have personally reviewed following labs and imaging studies  CBC: Recent Labs  Lab 04/02/18 1926 04/03/18 0459 04/04/18 0419 04/05/18 0443 04/06/18 0309  WBC 6.4 3.6* 3.9* 5.0 5.0  HGB 14.1 12.1* 13.0 13.1 12.8*  HCT 44.1 37.3* 39.6 40.0 39.4  MCV 95.5 94.0 93.4 94.6 93.8  PLT 113* 92* 94* 110* 98*   Basic Metabolic Panel: Recent Labs  Lab 04/02/18 1926 04/03/18 0459 04/04/18 0419 04/05/18 0443 04/06/18 0309  NA 132* 138 137 137 139  K 3.8 3.3* 3.4* 3.7 4.2  CL 96* 104 100 101 105  CO2 23 25 25 26 25   GLUCOSE 142* 112* 117* 106* 118*  BUN 20 21 23  32* 25*  CREATININE 1.05 0.98 0.97 1.23 0.97  CALCIUM 9.1 8.8* 8.7* 8.9 9.3  MG  --   --   --  2.0  --    GFR: Estimated Creatinine Clearance: 51.8 mL/min (by C-G formula based on SCr of 0.97 mg/dL). Liver Function Tests: No results for  input(s): AST, ALT, ALKPHOS, BILITOT, PROT, ALBUMIN in the last 168 hours. No results for input(s): LIPASE, AMYLASE in the last 168 hours. No results for input(s): AMMONIA in the last 168 hours. Coagulation Profile: Recent Labs  Lab 04/02/18 1926  INR 1.21   Cardiac Enzymes: Recent Labs  Lab 04/02/18 1926  TROPONINI 0.03*   BNP (last 3 results) No results for input(s): PROBNP in the last 8760 hours. HbA1C: No results for input(s): HGBA1C in the last 72 hours. CBG: No results for input(s): GLUCAP in the last 168 hours. Lipid Profile: No results for input(s): CHOL, HDL, LDLCALC, TRIG, CHOLHDL, LDLDIRECT in the last 72 hours. Thyroid Function Tests: No results for input(s): TSH, T4TOTAL, FREET4, T3FREE, THYROIDAB in the last 72 hours. Anemia Panel: No results for input(s): VITAMINB12, FOLATE, FERRITIN, TIBC, IRON, RETICCTPCT in the last 72 hours. Sepsis Labs: No results for input(s): PROCALCITON, LATICACIDVEN in the last 168 hours.  No results found for this or any previous visit (from the past 240 hour(s)).       Radiology Studies: No results found.      Scheduled Meds: . amLODipine  10 mg Oral Daily  . atorvastatin  20 mg Oral Daily  . balsalazide  2,250 mg Oral TID WC  . escitalopram  10 mg Oral Daily  . finasteride  5 mg Oral Daily  . furosemide  40 mg Intravenous Q12H  . latanoprost  1 drop Both Eyes QHS  . mouth rinse  15 mL Mouth Rinse BID  . metoprolol succinate  50 mg Oral Daily  . sodium chloride flush  3 mL Intravenous Q12H  . sodium chloride flush  3 mL Intravenous Q12H   Continuous Infusions: . sodium chloride    . heparin 1,200 Units/hr (04/05/18 1946)     LOS: 4 days      Domenic Polite, MD [facility]  If 7PM-7AM, please contact night-coverage [email or web address] Password [email or web address] 04/06/2018, 2:29 PM

## 2018-04-06 NOTE — Progress Notes (Signed)
ANTICOAGULATION CONSULT NOTE-follow up  Pharmacy Consult for Heparin Indication: Bridge while Apixaban on hold for hx Afib/CVA  Allergies  Allergen Reactions  . No Known Allergies     Patient Measurements: Height: 5\' 11"  (180.3 cm) Weight: 188 lb 11.2 oz (85.6 kg) IBW/kg (Calculated) : 75.3 Heparin Dosing Weight: 85.5 kg  Vital Signs: Temp: 98.3 F (36.8 C) (08/07 2000) Temp Source: Oral (08/07 2000) BP: 142/79 (08/07 2100) Pulse Rate: 61 (08/07 2000)  Labs: Recent Labs    04/04/18 0419  04/04/18 0901 04/04/18 1942 04/05/18 0443 04/06/18 0309  HGB 13.0  --   --   --  13.1 12.8*  HCT 39.6  --   --   --  40.0 39.4  PLT 94*  --   --   --  110* 98*  APTT  --    < > 44* 64* 94* 64*  HEPARINUNFRC  --   --  2.08*  --  0.95* 0.65  CREATININE 0.97  --   --   --  1.23 0.97   < > = values in this interval not displayed.    Estimated Creatinine Clearance: 51.8 mL/min (by C-G formula based on SCr of 0.97 mg/dL).   Medical History: Past Medical History:  Diagnosis Date  . BPH (benign prostatic hyperplasia)   . CAD (coronary artery disease)    3 vessel CAD, s/p CABG 04/1998  . Carotid artery disease ([place])    a. s/p R CEA   . Dyslipidemia   . Essential hypertension   . GAD (generalized anxiety disorder)   . Intestinal infection due to Clostridium difficile 03/2010  . PAF (paroxysmal atrial fibrillation) ([facility])    a. on Eliquis  . Presence of permanent cardiac pacemaker   . PVD (peripheral vascular disease) ([place])   . Severe aortic stenosis   . Ulcerative colitis     Assessment: 5 YOM who presented on 8/4 with SOB. Cardiology has been consulted and appears to be planning a cardiac cath for evaluation. The patient was on apixaban PTA for hx Afib/CVA - pharmacy consulted to transition to Heparin. -Last Apixaban dose on 8/5 @ 2200.   aPTT= 64, HL= 0.65 (with timing of last apixaban dose; may be able to rely on heparin levels)  Goal of Therapy:  Heparin level 0.3-0.7  units/ml aPTT 66-102 seconds Monitor platelets by anticoagulation protocol: Yes   Plan:  -No heparin changes needed -Continue daily heparin level and aPTT for now  Hildred Laser, PharmD Clinical Pharmacist Please check Amion for pharmacy contact number   04/06/2018 5:05 AM

## 2018-04-06 NOTE — Care Management Important Message (Signed)
Important Message  Patient Details  Name: Dalton Townsend MRN: [id number] Date of Birth: 1922/12/19   Medicare Important Message Given:  Yes    Stellarose Cerny P Pheng Prokop 04/06/2018, 4:18 PM

## 2018-04-06 NOTE — Progress Notes (Signed)
CT dept called in to take patient for STAT CT , patient and patient daughter stated Dr Bridgett Larsson seen patient earlier and told them not to do CT today re. "contrast use" . Verified CT order with Dr Bridgett Larsson . MD also  spoke with patient daughter ,  will do CT after discharge per patient & patient daughter.[name]

## 2018-04-06 NOTE — Progress Notes (Signed)
1 Day Post-Op Procedure(s) (LRB): RIGHT/LEFT HEART CATH AND CORONARY/GRAFT ANGIOGRAPHY (N/A) Subjective:  No chest pain or shortness of breath. Ambulated with PT and walked 500 ft in 6 minutes with no rest.  Objective: Vital signs in last 24 hours: Temp:  [97.5 F (36.4 C)-98 F (36.7 C)] 97.5 F (36.4 C) (08/08 1628) Pulse Rate:  [59-61] 59 (08/08 1628) Cardiac Rhythm: Ventricular paced (08/08 1911) Resp:  [16-21] 21 (08/08 1628) BP: (123-167)/(60-79) 136/62 (08/08 1628) SpO2:  [93 %-98 %] 97 % (08/08 1628) Weight:  [84.9 kg] 84.9 kg (08/08 0607)  Hemodynamic parameters for last 24 hours:    Intake/Output from previous day: 08/07 0701 - 08/08 0700 In: 240 [P.O.:240] Out: 900 [Urine:900] Intake/Output this shift: No intake/output data recorded.  General appearance: alert Heart: irregular rate and rhythm, 3/6 systolic murmur Lungs: clear to auscultation bilaterally  Lab Results: Recent Labs    04/05/18 0443 04/06/18 0309  WBC 5.0 5.0  HGB 13.1 12.8*  HCT 40.0 39.4  PLT 110* 98*   BMET:  Recent Labs    04/05/18 0443 04/06/18 0309  NA 137 139  K 3.7 4.2  CL 101 105  CO2 26 25  GLUCOSE 106* 118*  BUN 32* 25*  CREATININE 1.23 0.97  CALCIUM 8.9 9.3    PT/INR: No results for input(s): LABPROT, INR in the last 72 hours. ABG    Component Value Date/Time   PHART 7.410 04/05/2018 1024   HCO3 21.7 04/05/2018 1024   TCO2 23 04/05/2018 1024   ACIDBASEDEF 2.0 04/05/2018 1024   O2SAT 95.0 04/05/2018 1024   CBG (last 3)  No results for input(s): GLUCAP in the last 72 hours.  Assessment/Plan:  Critical aortic stenosis Still working on trying to schedule for next week. He wants to go home tomorrow. If he goes home Eliquis should not be restarted. Apparently family decided against doing neck CTA at this time to evaluate the right carotid stenosis. Vascular surgery feels that this can be done later as outpt.   LOS: 4 days    Gaye Pollack 04/06/2018

## 2018-04-07 DIAGNOSIS — I480 Paroxysmal atrial fibrillation: Secondary | ICD-10-CM

## 2018-04-07 LAB — BLOOD GAS, ARTERIAL
Acid-Base Excess: 1.1 mmol/L (ref 0.0–2.0)
BICARBONATE: 24.6 mmol/L (ref 20.0–28.0)
DRAWN BY: 39899
FIO2: 21
O2 Saturation: 85.1 %
PATIENT TEMPERATURE: 98.6
PH ART: 7.455 — AB (ref 7.350–7.450)
pCO2 arterial: 35.5 mmHg (ref 32.0–48.0)
pO2, Arterial: 48.4 mmHg — ABNORMAL LOW (ref 83.0–108.0)

## 2018-04-07 LAB — BASIC METABOLIC PANEL
ANION GAP: 9 (ref 5–15)
BUN: 28 mg/dL — ABNORMAL HIGH (ref 8–23)
CALCIUM: 9.2 mg/dL (ref 8.9–10.3)
CHLORIDE: 105 mmol/L (ref 98–111)
CO2: 26 mmol/L (ref 22–32)
CREATININE: 1.18 mg/dL (ref 0.61–1.24)
GFR calc Af Amer: 60 mL/min — ABNORMAL LOW (ref >60)
GFR calc non Af Amer: 52 mL/min — ABNORMAL LOW (ref >60)
Glucose, Bld: 114 mg/dL — ABNORMAL HIGH (ref 70–99)
Potassium: 3.8 mmol/L (ref 3.5–5.1)
Sodium: 140 mmol/L (ref 135–145)

## 2018-04-07 LAB — PROTIME-INR
INR: 1.07
Prothrombin Time: 13.8 seconds (ref 11.4–15.2)

## 2018-04-07 LAB — CBC
HEMATOCRIT: 37.6 % — AB (ref 39.0–52.0)
HEMOGLOBIN: 12.3 g/dL — AB (ref 13.0–17.0)
MCH: 30.8 pg (ref 26.0–34.0)
MCHC: 32.7 g/dL (ref 30.0–36.0)
MCV: 94 fL (ref 78.0–100.0)
Platelets: 138 10*3/uL — ABNORMAL LOW (ref 150–400)
RBC: 4 MIL/uL — ABNORMAL LOW (ref 4.22–5.81)
RDW: 14 % (ref 11.5–15.5)
WBC: 6 10*3/uL (ref 4.0–10.5)

## 2018-04-07 LAB — URINALYSIS, ROUTINE W REFLEX MICROSCOPIC
BILIRUBIN URINE: NEGATIVE
Glucose, UA: NEGATIVE mg/dL
HGB URINE DIPSTICK: NEGATIVE
Ketones, ur: NEGATIVE mg/dL
NITRITE: NEGATIVE
PROTEIN: NEGATIVE mg/dL
SPECIFIC GRAVITY, URINE: 1.015 (ref 1.005–1.030)
pH: 6 (ref 5.0–8.0)

## 2018-04-07 LAB — BRAIN NATRIURETIC PEPTIDE: B NATRIURETIC PEPTIDE 5: 461.7 pg/mL — AB (ref 0.0–100.0)

## 2018-04-07 LAB — HEMOGLOBIN A1C
Hgb A1c MFr Bld: 5.4 % (ref 4.8–5.6)
Mean Plasma Glucose: 108.28 mg/dL

## 2018-04-07 LAB — TYPE AND SCREEN
ABO/RH(D): A NEG
Antibody Screen: NEGATIVE

## 2018-04-07 LAB — SURGICAL PCR SCREEN
MRSA, PCR: NEGATIVE
STAPHYLOCOCCUS AUREUS: POSITIVE — AB

## 2018-04-07 LAB — HEPARIN LEVEL (UNFRACTIONATED): Heparin Unfractionated: 0.48 IU/mL (ref 0.30–0.70)

## 2018-04-07 MED ORDER — POTASSIUM CHLORIDE 10 MEQ PO TBCR: 20.0000 meq | EXTENDED_RELEASE_TABLET | Freq: Two times a day (BID) | ORAL | 0 refills | Status: DC

## 2018-04-07 MED ORDER — FUROSEMIDE 40 MG PO TABS: 40.0000 mg | ORAL_TABLET | Freq: Two times a day (BID) | ORAL | Status: DC

## 2018-04-07 MED ORDER — FUROSEMIDE 40 MG PO TABS
40.0000 mg | ORAL_TABLET | Freq: Two times a day (BID) | ORAL | Status: DC
  Administered 2018-04-07: 10:00:00 40 mg via ORAL
  Filled 2018-04-07: qty 1

## 2018-04-07 MED ORDER — FUROSEMIDE 10 MG/ML IJ SOLN: 40.0000 mg | Freq: Two times a day (BID) | INTRAMUSCULAR | Status: DC

## 2018-04-07 NOTE — Progress Notes (Signed)
ABG was ordered for patient for preop.  Venous sample was obtained and results were ran.  Called MD to see if patient needed to be stuck again for arterial sample.  Per MD, will hold on sticking patient again and obtain another ABG when patient returns for surgery.     Ref. Range 04/07/2018 10:00  Sample type Unknown ARTERIAL DRAW  FIO2 Unknown 21.00  pH, Arterial Latest Ref Range: 7.350 - 7.450  7.455 (H)  pCO2 arterial Latest Ref Range: 32.0 - 48.0 mmHg 35.5  pO2, Arterial Latest Ref Range: 83.0 - 108.0 mmHg 48.4 (L)  Acid-Base Excess Latest Ref Range: 0.0 - 2.0 mmol/L 1.1  Bicarbonate Latest Ref Range: 20.0 - 28.0 mmol/L 24.6  O2 Saturation Latest Units: % 85.1  Patient temperature Unknown 98.6  Collection site Unknown LEFT RADIAL  Allens test (pass/fail) Latest Ref Range: PASS  PASS

## 2018-04-07 NOTE — Progress Notes (Signed)
ANTICOAGULATION CONSULT NOTE-follow up  Pharmacy Consult for Heparin Indication: Bridge while Apixaban on hold in hospital  Allergies  Allergen Reactions  . No Known Allergies     Patient Measurements: Height: 5\' 11"  (180.3 cm) Weight: 194 lb 0.1 oz (88 kg) IBW/kg (Calculated) : 75.3 Heparin Dosing Weight: 85.5 kg  Vital Signs: Temp: 97.6 F (36.4 C) (08/09 0339) Temp Source: Oral (08/08 2045) BP: 158/67 (08/09 0722) Pulse Rate: 59 (08/09 0722)  Labs: Recent Labs    04/04/18 1942  04/05/18 0443 04/06/18 0309 04/07/18 0329  HGB  --    < > 13.1 12.8* 12.3*  HCT  --   --  40.0 39.4 37.6*  PLT  --   --  110* 98* 138*  APTT 64*  --  94* 64*  --   HEPARINUNFRC  --   --  0.95* 0.65 0.48  CREATININE  --   --  1.23 0.97 1.18   < > = values in this interval not displayed.    Estimated Creatinine Clearance: 42.5 mL/min (by C-G formula based on SCr of 1.18 mg/dL).    Assessment: 60 YOM who presented on 8/4 with SOB. Cardiology has been consulted and appears to be planning a cardiac cath for evaluation. The patient was on apixaban PTA for hx Afib/CVA - pharmacy consulted to transition to Heparin. -Last Apixaban dose on 8/5 @ 2200.   Heparin level therapeutic  Goal of Therapy:  Heparin level 0.3-0.7 units/ml aPTT 66-102 seconds Monitor platelets by anticoagulation protocol: Yes   Plan:  -No heparin changes needed -Follow up am labs  Thank you Anette Guarneri, PharmD Clinical Pharmacist Please check Amion for pharmacy contact number   04/07/2018 8:36 AM

## 2018-04-07 NOTE — Progress Notes (Signed)
Progress Note  Patient Name: Dalton Townsend Date of Encounter: 04/07/2018  Primary Cardiologist: No primary care provider on file.   Subjective   The patient denies SOB, he was able to walk with PT.   Inpatient Medications    Scheduled Meds: . amLODipine  10 mg Oral Daily  . atorvastatin  20 mg Oral Daily  . balsalazide  2,250 mg Oral TID WC  . escitalopram  10 mg Oral Daily  . finasteride  5 mg Oral Daily  . furosemide  40 mg Intravenous Q12H  . latanoprost  1 drop Both Eyes QHS  . mouth rinse  15 mL Mouth Rinse BID  . metoprolol succinate  50 mg Oral Daily  . sodium chloride flush  3 mL Intravenous Q12H  . sodium chloride flush  3 mL Intravenous Q12H   Continuous Infusions: . sodium chloride    . heparin 1,200 Units/hr (04/06/18 1641)   PRN Meds: sodium chloride, sodium chloride flush   Vital Signs    Vitals:   04/06/18 2000 04/06/18 2045 04/07/18 0339 04/07/18 0722  BP:  134/60 (!) 147/69 (!) 158/67  Pulse:  61 (!) 59 (!) 59  Resp: 19 (!) 21 16 (!) 21  Temp:  97.9 F (36.6 C) 97.6 F (36.4 C) (!) 97.5 F (36.4 C)  TempSrc:  Oral  Oral  SpO2:   95% 96%  Weight:   88 kg   Height:        Intake/Output Summary (Last 24 hours) at 04/07/2018 0855 Last data filed at 04/07/2018 0700 Gross per 24 hour  Intake 240 ml  Output 1975 ml  Net -1735 ml   Filed Weights   04/04/18 2207 04/06/18 0607 04/07/18 0339  Weight: 85.6 kg 84.9 kg 88 kg    Telemetry    Atrial sensed V paced rhythm - Personally Reviewed  ECG    Atrial sensed V paced rhythm - Personally Reviewed  Physical Exam   GEN: No acute distress.   Neck: No JVD Cardiac: RRR, 5/6 holosystolic murmurs, no S2, rubs, or gallops.  Respiratory: Crackles bilaterally. GI: Soft, nontender, non-distended  MS: No edema; No deformity. Neuro:  Nonfocal  Psych: Normal affect   Labs    Chemistry Recent Labs  Lab 04/05/18 0443 04/06/18 0309 04/07/18 0329  NA 137 139 140  K 3.7 4.2 3.8  CL 101  105 105  CO2 26 25 26   GLUCOSE 106* 118* 114*  BUN 32* 25* 28*  CREATININE 1.23 0.97 1.18  CALCIUM 8.9 9.3 9.2  GFRNONAA 49* >60 52*  GFRAA 57* >60 60*  ANIONGAP 10 9 9      Hematology Recent Labs  Lab 04/05/18 0443 04/06/18 0309 04/07/18 0329  WBC 5.0 5.0 6.0  RBC 4.23 4.20* 4.00*  HGB 13.1 12.8* 12.3*  HCT 40.0 39.4 37.6*  MCV 94.6 93.8 94.0  MCH 31.0 30.5 30.8  MCHC 32.8 32.5 32.7  RDW 14.3 13.9 14.0  PLT 110* 98* 138*    Cardiac Enzymes Recent Labs  Lab 04/02/18 1926  TROPONINI 0.03*    Recent Labs  Lab 04/02/18 1928  TROPIPOC 0.03    BNP Recent Labs  Lab 04/02/18 1927  BNP 1,143.9*    DDimer No results for input(s): DDIMER in the last 168 hours.   Radiology    No results found. Cardiac Studies   LHC: 04/05/2018  Severe triple vessel CAD s/p 6VCABG with 6 patent grafts.  2. Chronic occlusion proximal LAD and proximal Circumflex, mid to  distal RCA 3. Severe aortic stenosis by echo.   Patient Profile     84 y.o. male   Assessment & Plan    1. Acute on chronic diastolic HF 2. Severe AS 3. CAD s/p CABG ('99) 4. HTN 5. HL 6. SSS s/p PPM 7. PAF 8. Hypokalemia  The patient underwent TAVR evaluation with TAVR CTAs, LHC showing patent grafts. He was found to have 80% RICA stenosis, VVS wanted to get a CTA to further evaluate carot arteries, to be done as outpatient. The patient appears deconditioned, he was walking with PT yesterday, he now appears euvolemic, his new baseline is 85 kg. We will discharge on Lasix 40 mg po BID, hold Eliquis in anticipation of the procedure on Tuesday. Instructions pper structural team. Prop labs today. He can be discharged today.  For questions or updates, please contact [place] Please consult [email or web address] for contact info under Cardiology/STEMI.     Signed, Ena Dawley, MD  04/07/2018, 8:55 AM

## 2018-04-07 NOTE — Progress Notes (Signed)
Anesthesia Chart Review: Dalton Townsend  Case:  [id number] Date/Time:  04/11/18 0300   Procedures:      TRANSCATHETER AORTIC VALVE REPLACEMENT, TRANSFEMORAL (N/A Chest)     TRANSESOPHAGEAL ECHOCARDIOGRAM (TEE) (N/A )   Anesthesia type:  General   Pre-op diagnosis:  Severe Aortic Stenosis   Location:  [facility] OR ROOM 16 / [place] [facility]   Surgeon:  Sherren Mocha, MD    CT surgeon Gilford Raid, MD  DISCUSSION: Patient is a 84 year old male scheduled for the above procedure.   History includes severe, AS, diastolic CHF, PAF, CAD (s/p 6V CABG 04/1998; patent grafts 04/05/18), SSS/CHB (s/p [employer] PPM 10/16/04; insert new RV lead and generator change 03/23/10), HTN, PAD (left CIA aneurysm, 2.9 cm), CVA ('01), carotid artery stenosis (s/p right CEA '01, recurrent right ICA stenosis 03/2018, further evaluation post-TAVR), ulcerative colitis. Former smoker.  He is a same day work-up because he was just hospitalized from 04/02/18-04/07/18 for acute on chronic diastolic CHF in the setting of severe AS. Symptoms improved with diuresis. Cardiac cath showed 6/6 patent grafts. He was seen by cardiology and CT surgery and completed TAVR work-up during his hospitalization. Eliquis on hold at discharge with plans to return for TAVR on 04/11/18. He was also seen by Adele Barthel, MD with vascular surgery for recurrent RICA stenosis. CTA needed before planning for carotid intervention. Ultimately, decision made to defer further evaluation and management of carotid disease until after recovery from TAVR.    Re-evaluation by his anesthesia and surgical teams on the day of surgery. Theodosia Quay, RN already contacted [employer] rep about his PPM.    VS: BP 126/68 (BP Location: Right Arm)   Pulse 60   Temp 36.6 C   Resp 20   Ht 5\' 11"  (1.803 m)   Wt 85.1 kg   SpO2 98%   BMI 26.17 kg/m   PROVIDERS: Lajean Manes, MD is PCP Candee Furbish, MD is primary cardiologist Cristopher Peru, MD is EP cardiologist Servando Snare, MD is vascular  surgeon (to see post TAVR)   LABS: Most recent lab results include: Lab Results  Component Value Date   WBC 6.0 04/07/2018   HGB 12.3 (L) 04/07/2018   HCT 37.6 (L) 04/07/2018   PLT 138 (L) 04/07/2018   GLUCOSE 114 (H) 04/07/2018   ALT 39 05/24/2010   AST 30 05/24/2010   NA 140 04/07/2018   K 3.8 04/07/2018   CL 105 04/07/2018   CREATININE 1.18 04/07/2018   BUN 28 (H) 04/07/2018   CO2 26 04/07/2018   INR 1.07 04/07/2018   HGBA1C 5.4 04/07/2018  PT/INR 13.8/1.07. PTT 64, but was also on heparin gtt. Defer to TAVR team if repeat PTT or ABG needed (message sent to Theodosia Quay, RN).    OTHER: PFTs 04/04/18: FVC 2.11 (58%), FEV1 1.46 (59%), DLCO unc 13.92 (41%).   IMAGES: CTA chest/abd/pelvis 04/04/18: IMPRESSION: 1. Vascular findings and measurements pertinent to potential TAVR procedure, as detailed above. 2. Severe thickening calcification of the aortic valve, compatible with the reported clinical history of severe aortic stenosis. 3. Aortic atherosclerosis, in addition to left main and 3 vessel coronary artery disease. Status post median sternotomy for CABG including LIMA to the LAD. 4. 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 5. Moderate ostial narrowing of the common hepatic artery (direct origin from the abdominal aorta) and the superior mesenteric artery, both of which measure 5 mm in diameter. 6. Colonic  diverticulosis without evidence of acute diverticulitis at this time. 7. 7 mm nonobstructive calculus in the upper pole collecting system of left kidney. 8. Small right and trace left pleural effusions lying dependently. 9. Small pulmonary nodules in the right lung, largest of which measures only 4 mm in the right middle lobe. No follow-up needed if patient is low-risk (and has no known or suspected primary neoplasm). Non-contrast chest CT can be considered in 12 months if patient is high-risk. This  recommendation follows the consensus statement: Guidelines for Management of Incidental Pulmonary Nodules Detected on CT Images: From the Fleischner Society 2017; Radiology 2017; 284:228-243. 10. Additional incidental findings, as above. (See full report)  EKG: 04/02/18: ST at 103 bpm, atrial premature complexes, probable left atrial enlargement, IVCD, consider atypical right BBB, LVH with IVCD, LAD and secondary repolarization abnormality. Prolonged QT (QT 432 mc, QTc 566 ms).    CV: Cardiac cath 04/05/18 Angelena Form, Harrell Gave, MD):  Mid LM to [facility] LAD lesion is 100% stenosed.  Ost Cx to Prox Cx lesion is 100% stenosed.  LIMA graft was visualized by angiography and is normal in caliber.  SVG graft was visualized by angiography and is normal in caliber.  SVG graft was visualized by angiography and is normal in caliber.  Prox RCA lesion is 50% stenosed.  Dist RCA lesion is 100% stenosed.  SVG graft was visualized by angiography and is normal in caliber.  Hemodynamic findings consistent with mild pulmonary hypertension. 1. Severe triple vessel CAD s/p 6VCABG with 6 patent grafts.  2. Chronic occlusion proximal LAD and proximal Circumflex, mid to distal RCA 3. Severe aortic stenosis by echo. Unable to advance catheters into the LV due to tortuosity in the left subclavian artery. Coronary angiography was complete and I did not think it would be worthwhile to gain femoral artery access just to cross his valve. No LV pressures or aortic valve data obtained today by cath.  Recommendations: Will continue workup for TAVR.   CT cardiac 04/04/18: IMPRESSION: 1. Trileaflet aortic valve with severely thickened and calcified leaflets and moderately impaired leaflet opening with only trivial calcifications extending into the LVOT. Annular measurements are suitable for delivery of a 26 mm Edwards-SAPIEN 3 valve. 2. Sufficient coronary to annulus distance. 3. Optimum Fluoroscopic Angle for  Delivery:  LAO 2 CAU 2 4. No thrombus in the left atrial appendage.  Echo 04/04/18: Study Conclusions - Left ventricle: Abnormal septal motion inferior basal hypokinesis   The cavity size was mildly dilated. Wall thickness was increased   in a pattern of moderate LVH. Systolic function was normal. The   estimated ejection fraction was in the range of 50% to 55%. Left   ventricular diastolic function parameters were normal. - Aortic valve: There was critical stenosis. There was mild   regurgitation. Valve area (VTI): 0.74 cm^2. Valve area (Vmax):   0.82 cm^2. Valve area (Vmean): 0.73 cm^2. - Mitral valve: Calcified annulus. Mildly thickened leaflets .   There was mild regurgitation. - Left atrium: The atrium was moderately dilated. - Right atrium: The atrium was mildly dilated. - Atrial septum: No defect or patent foramen ovale was identified.  Carotid U/S 04/04/18: Final Interpretation: Right Carotid: Velocities in the right ICA are consistent with a 80-99%        stenosis. Left Carotid: Velocities in the left ICA are consistent with a 1-39% stenosis. Vertebrals: Bilateral vertebral arteries demonstrate antegrade flow.   Past Medical History:  Diagnosis Date  . BPH (benign prostatic hyperplasia)   .  CAD (coronary artery disease)    3 vessel CAD, s/p CABG 04/1998  . Carotid artery disease ([place])    a. s/p R CEA   . Dyslipidemia   . Essential hypertension   . GAD (generalized anxiety disorder)   . Intestinal infection due to Clostridium difficile 03/2010  . PAF (paroxysmal atrial fibrillation) ([facility])    a. on Eliquis  . Presence of permanent cardiac pacemaker   . PVD (peripheral vascular disease) ([place])   . Severe aortic stenosis   . Ulcerative colitis     Past Surgical History:  Procedure Laterality Date  . CARDIAC CATHETERIZATION  1999  . CAROTID ENDARTERECTOMY Right 2004  . CATARACT EXTRACTION, BILATERAL  2011  . CORONARY ARTERY BYPASS GRAFT  04/1998   3 vessel CAD   . INCISION AND DRAINAGE OF WOUND Left 05/04/2016   Procedure: SURGICAL PREP FOR GRAFTING LEFT LEG WITH THERA SKIN APPLICATION;  Surgeon: Irene Limbo, MD;  Location: [place];  Service: Plastics;  Laterality: Left;  . INTRAOPERATIVE ARTERIOGRAM  06/2000  . PACEMAKER INSERTION  03/25/2010  . RIGHT/LEFT HEART CATH AND CORONARY/GRAFT ANGIOGRAPHY N/A 04/05/2018   Procedure: RIGHT/LEFT HEART CATH AND CORONARY/GRAFT ANGIOGRAPHY;  Surgeon: Burnell Blanks, MD;  Location: [place] [facility];  Service: Cardiovascular;  Laterality: N/A;  . TOTAL KNEE ARTHROPLASTY  07/16/09  . TRANSURETHRAL RESECTION OF PROSTATE  1998    MEDICATIONS: . 0.9 %  sodium chloride infusion  . amLODipine (NORVASC) tablet 10 mg  . atorvastatin (LIPITOR) tablet 20 mg  . balsalazide (COLAZAL) capsule 2,250 mg  . escitalopram (LEXAPRO) tablet 10 mg  . finasteride (PROSCAR) tablet 5 mg  . furosemide (LASIX) tablet 40 mg  . heparin ADULT infusion 100 units/mL (25000 units/257mL sodium chloride 0.45%)  . latanoprost (XALATAN) 0.005 % ophthalmic solution 1 drop  . MEDLINE mouth rinse  . metoprolol succinate (TOPROL-XL) 24 hr tablet 50 mg  . sodium chloride flush (NS) 0.9 % injection 3 mL  . sodium chloride flush (NS) 0.9 % injection 3 mL  . sodium chloride flush (NS) 0.9 % injection 3 mL    [facility] Short Stay Center/Anesthesiology Phone [phone] 04/07/2018 6:39 PM

## 2018-04-07 NOTE — Consult Note (Signed)
            [facility] CM Primary Care Navigator  04/07/2018  COSBY PROBY 01/11/1923 [id number]   Went to see patientat the bedsideto identify possible discharge needs. Patientreported having "trouble breathing"thathad led to this admission. (Critical aortic stenosis with acute on chronic diastolic congestive HF) . PatientendorsesDr.Hal Stoneking with [facility] at [facility] provider.   Patientstates using [employer] home delivery service and [facility] on [address] to obtainmedications without difficulty so far.  Patient reportsmanaginghisownmedications at home with use of "pill box" system filled weekly.  Patientstatesthathe has beendrivingprior to admission but daughters Arbie Cookey and [name]) or son in-law (Doug)will be able to provide transportationtohisdoctors'appointments if neededafter discharge.   Patient reports livingwith wife(Rose) who serves as his primary caregiver at home when needed.  Discharge plan ishomeaccording topatient.  Patient voiced understanding to call primary care provider'soffice for a post discharge follow-up appointment within1- 2 weeksor sooner if needs arise.Primary care provider's office is listed as providing transition of care (TOC) follow-up.  Explained topatientregardingTHN CM services available for health management and resourcesat home but prefers to defer any services until his procedure is done next week  (TAVR- Transaortic Valve Replacement). He haddeclinedTHN-CMservicesfor now whichincludes EMMIcalls to follow-up with hisrecovery. Patient is hopeful that his health issues will improve and get better after his surgery. Patienthowever,expressed understanding of need to seekreferral to [facility] care managementfrom primary care providerifdeemed necessary and appropriate for any servicesin the nearfuture.   [facility] care management information  provided for future needs that may arise.   For additional questions please contact:  Edwena Felty A. Kumari Sculley, BSN, RN-BC [facility] PRIMARY CARE Navigator Cell: [phone]

## 2018-04-07 NOTE — Discharge Summary (Signed)
Physician Discharge Summary  Dalton Townsend BSW:[id number] DOB: 09/27/1922 DOA: 04/02/2018  PCP: Lajean Manes, MD  Admit date: 04/02/2018 Discharge date: 04/07/2018  Time spent:35 minutes  Recommendations for Outpatient Follow-up:  1. To be admitted next Tuesday 8/13 for elective TAVR   Discharge Diagnoses:    Critical aortic stenosis   Acute on chronic diastolic CHF   Recurrent right ICA stenosis   Paroxysmal atrial fibrillation   Depression   BPH   Hypokalemia   HTN (hypertension)   Discharge Condition: stable  Diet recommendation: low sodium heart healthy  Filed Weights   04/06/18 0607 04/07/18 0339 04/07/18 0859  Weight: 84.9 kg 88 kg 85.1 kg    History of present illness:   84 year old man with medical problems including severe aortic stenosis, diastolic heart failure, atrial fibrillation on anticoagulation with DOAC, coronary artery disease status post CABG, ulcerative colitis under good control who presents with shortness of breath  Hospital Course:   1. Critical aortic stenosis with acute on chronic diastolic CHF -improved with diuresis,  -Repeat echocardiogram and noted severe to critical aortic stenosis with preserved ejection fraction peak gradient of 88 and transvalvular gradient of 52 mmHg -Cardiology consulted, completed evaluation for TAVR with TAVR CTAs and left heart catheterization 8/7 which noted patent grafts and 80% R ICA stenosis -CVTS consulted, seen by Dr.Bartle -plan for admission next Tuesday 8/13 for elective TAVR, patient has been advised to hold Eliquis for this -clinically euvolemic now, discharged home on Lasix 40 mg twice a day with potassium  2. P.Afib -V paced -hold Eliquis at DC for TAVR early next week  3. Recurrent right ICA stenosis 80-99% -History of right CEA in 2001 -Vascular surgery consulted, plan to defer this workup and management until TAVR, will need CTA neck before planning any intervention -FU with Dr.Cain  3.  depression continue Lexapro  4. BPH continue Proscar  5. Hypokalemia -repleted  Discharge Exam: Vitals:   04/07/18 0722 04/07/18 1241  BP: (!) 158/67 126/68  Pulse: (!) 59 60  Resp: (!) 21 20  Temp: (!) 97.5 F (36.4 C) 97.9 F (36.6 C)  SpO2: 96% 98%    General: AAOx3 Cardiovascular: S1S2/RRR, loud SEM Respiratory: CTAB  Discharge Instructions   Discharge Instructions    Diet - low sodium heart healthy   Complete by:  As directed    Increase activity slowly   Complete by:  As directed      Allergies as of 04/07/2018      Reactions   No Known Allergies       Medication List    STOP taking these medications   ELIQUIS 5 MG Tabs tablet Generic drug:  apixaban   losartan 100 MG tablet Commonly known as:  COZAAR     TAKE these medications   acetaminophen 500 MG tablet Commonly known as:  TYLENOL Take 1,000 mg by mouth at bedtime.   amLODipine 5 MG tablet Commonly known as:  NORVASC Take 1 tablet (5 mg total) by mouth daily.   amoxicillin 500 MG capsule Commonly known as:  AMOXIL Take 2,000 mg by mouth See admin instructions. Take 4 capsules (2000 mg) by mouth one hour prior to dental appointments   balsalazide 750 MG capsule Commonly known as:  COLAZAL Take 2,250 mg by mouth 3 (three) times daily.   escitalopram 10 MG tablet Commonly known as:  LEXAPRO Take 10 mg by mouth daily.   finasteride 5 MG tablet Commonly known as:  PROSCAR Take 5 mg by  mouth See admin instructions. Take one tablet (5 mg) by mouth every day except Wednesdays   furosemide 40 MG tablet Commonly known as:  LASIX Take 1 tablet (40 mg total) by mouth 2 (two) times daily. What changed:  when to take this   LIPITOR 20 MG tablet Generic drug:  atorvastatin Take 20 mg by mouth every other day.   mesalamine 1000 MG suppository Commonly known as:  CANASA Place 1,000 mg rectally at bedtime as needed (ulcerative colitis).   metoprolol succinate 50 MG 24 hr tablet Commonly  known as:  TOPROL-XL Take 50 mg by mouth daily. Take with or immediately following a meal.   multivitamin with minerals Tabs tablet Take 1 tablet by mouth daily.   potassium chloride 10 MEQ CR tablet Commonly known as:  KLOR-CON Take 2 tablets (20 mEq total) by mouth 2 (two) times daily. To be taken with lasix What changed:    how much to take  when to take this  reasons to take this  additional instructions   XALATAN 0.005 % ophthalmic solution Generic drug:  latanoprost Place 1 drop into both eyes at bedtime.      Allergies  Allergen Reactions  . No Known Allergies    Follow-up Information    [place]. Go on 04/11/2018.   Why:  Please arrive to [facility] admitting [facility] tower main entrance) at 7:45am. Nothing to eat after midnight the night before. No medications on the morning of surgery. Shower with dial soap the night before surgery and the morning of surgery.  Contact information: [place] [address] [phone]           The results of significant diagnostics from this hospitalization (including imaging, microbiology, ancillary and laboratory) are listed below for reference.    Significant Diagnostic Studies: Ct Coronary Morph W/cta Cor W/score W/ca W/cm &/or Wo/cm  Addendum Date: 04/05/2018   ADDENDUM REPORT: 04/05/2018 14:16 CLINICAL DATA:  84 year old male with severe symptomatic aortic stenosis being evaluated for a TAVR procedure. EXAM: Cardiac TAVR CT TECHNIQUE: The patient was scanned on a [employer]. A 120 kV retrospective scan was triggered in the descending thoracic aorta at 111 HU's. Gantry rotation speed was 250 msecs and collimation was .6 mm. No beta blockade or nitro were given. The 3D data set was reconstructed in 5% intervals of the R-R cycle. Systolic and diastolic phases were analyzed on a dedicated work station using MPR, MIP and VRT modes. The patient received 80 cc  of contrast. FINDINGS: Aortic Valve: Trileaflet aortic valve with severely thickened and calcified leaflets and moderately impaired leaflet opening. There are only trivial calcifications extending into the LVOT. Aorta: Normal size, mild calcifications in the ascending aorta, but severe calcifications and atheroma in the descending aorta. No dissection. Sinotubular Junction: 29 x 28 mm Ascending Thoracic Aorta: 33 x 32 mm Aortic Arch: 31 x 28 mm Descending Thoracic Aorta: 27 x 25 mm Sinus of Valsalva Measurements: Non-coronary: 36 mm Right -coronary: 32 mm Left -coronary: 34 mm Coronary Artery Height above Annulus: Left Main: 12 mm Right Coronary: 14 mm Virtual Basal Annulus Measurements: Maximum/Minimum Diameter: 28.6 x 23.3 mm Mean Diameter: 24.7 mm Perimeter: 80 mm Area: 479 mm Optimum Fluoroscopic Angle for Delivery: LAO 2 CAU 2 IMPRESSION: 1. Trileaflet aortic valve with severely thickened and calcified leaflets and moderately impaired leaflet opening with only trivial calcifications extending into the LVOT. Annular measurements are suitable for delivery of a 26  mm Edwards-SAPIEN 3 valve. 2. Sufficient coronary to annulus distance. 3. Optimum Fluoroscopic Angle for Delivery:  LAO 2 CAU 2 4. No thrombus in the left atrial appendage. Electronically Signed   By: Ena Dawley   On: 04/05/2018 14:16   Result Date: 04/05/2018 EXAM: OVER-READ INTERPRETATION  CT CHEST The following report is an over-read performed by radiologist Dr. Vinnie Langton of [facility], [place] on 04/04/2018. This over-read does not include interpretation of cardiac or coronary anatomy or pathology. The coronary calcium score/coronary CTA interpretation by the cardiologist is attached. COMPARISON:  None. FINDINGS: Extracardiac findings will be described under dictation for contemporaneously obtained CTA chest, abdomen and pelvis. IMPRESSION: Please see separate dictation for contemporaneously obtained CTA chest, abdomen and pelvis  dated 04/04/2018 for full description of relevant extracardiac findings. Electronically Signed: By: Vinnie Langton M.D. On: 04/04/2018 14:50   Dg Chest Port 1 View  Result Date: 04/02/2018 CLINICAL DATA:  Shortness of breath, wheezing. EXAM: PORTABLE CHEST 1 VIEW COMPARISON:  Chest radiograph March 19, 2017 FINDINGS: Cardiac silhouette is mildly enlarged unchanged. Calcified aortic arch. Status post median sternotomy for CABG. Increased interstitial prominence. Trace pleural effusions. Patchy RIGHT lung base airspace opacity. Bandlike density LEFT lung base. RIGHT mid lung zone scarring. No pneumothorax. LEFT cardiac pacemaker in situ. Osteopenia. IMPRESSION: Increasing interstitial prominence seen with atypical infection or pulmonary edema with RIGHT lung base consolidation. Trace pleural effusions. LEFT lung base atelectasis/scarring. Stable cardiomegaly. Aortic Atherosclerosis (ICD10-I70.0). Electronically Signed   By: Elon Alas M.D.   On: 04/02/2018 19:42   Ct Angio Chest Aorta W/cm &/or Wo/cm  Result Date: 04/04/2018 CLINICAL DATA:  84 year old male with history of severe aortic stenosis. Preprocedural study prior to potential transcatheter aortic valve replacement (TAVR). EXAM: CT ANGIOGRAPHY CHEST, ABDOMEN AND PELVIS TECHNIQUE: Multidetector CT imaging through the chest, abdomen and pelvis was performed using the standard protocol during bolus administration of intravenous contrast. Multiplanar reconstructed images and MIPs were obtained and reviewed to evaluate the vascular anatomy. CONTRAST:  132m ISOVUE-370 IOPAMIDOL (ISOVUE-370) INJECTION 76% COMPARISON:  CT the abdomen and pelvis 05/14/2010. FINDINGS: CTA CHEST FINDINGS Cardiovascular: Heart size is mildly enlarged. There is no significant pericardial fluid, thickening or pericardial calcification. There is aortic atherosclerosis, as well as atherosclerosis of the great vessels of the mediastinum and the coronary arteries, including  calcified atherosclerotic plaque in the left main, left anterior descending, left circumflex and right coronary arteries. Status post median sternotomy for CABG including LIMA to the LAD. Severe thickening calcification of the aortic valve. Left-sided pacemaker with lead tips terminating in the right atrial appendage and near the right ventricular apex. Mediastinum/Lymph Nodes: No pathologically enlarged mediastinal or hilar lymph nodes. Esophagus is unremarkable in appearance. No axillary lymphadenopathy. Lungs/Pleura: Small right and trace left pleural effusions lying dependently. No acute consolidative airspace disease. Two tiny pulmonary nodules are noted in the right lung, largest of which measures 4 mm in the lateral segment of the right middle lobe (axial image 51 of series 15). No larger more suspicious appearing pulmonary nodules or masses are noted. Musculoskeletal/Soft Tissues: There are no aggressive appearing lytic or blastic lesions noted in the visualized portions of the skeleton. Median sternotomy wires. CTA ABDOMEN AND PELVIS FINDINGS Hepatobiliary: 11 mm low-attenuation lesion in segment 4A of the liver, compatible with a simple cyst. No other larger more suspicious appearing hepatic lesions are noted. No intra or extrahepatic biliary ductal dilatation. Gallbladder is moderately distended, but otherwise unremarkable in appearance. Pancreas: No pancreatic mass. No pancreatic  ductal dilatation. No pancreatic or peripancreatic fluid or inflammatory changes. Spleen: Unremarkable. Adrenals/Urinary Tract: Multiple low-attenuation lesions in both kidneys, compatible with simple cysts, measuring up to 19 mm in diameter in the upper and lower poles of the right kidney. 7 mm nonobstructive calculus in the upper pole collecting system of the left kidney. No hydroureteronephrosis. Urinary bladder is unremarkable in appearance. Stomach/Bowel: Normal appearance of the stomach. No pathologic dilatation of small  bowel or colon. A few scattered colonic diverticulae are noted, particularly in the sigmoid colon, without surrounding inflammatory changes to suggest an acute diverticulitis at this time. Normal appendix. Vascular/Lymphatic: Aortic atherosclerosis with vascular findings and measurements pertinent to potential TAVR procedure, as detailed below. There is also aneurysmal dilatation of the left common iliac artery which measures up to 2.9 cm in diameter. Separate origin of the common hepatic artery directly off the aorta, with moderate ostial narrowing of this vessel where there is a mean diameter of only 5 mm. Remaining portions of the celiac axis are otherwise widely patent without hemodynamically significant stenosis. There is also moderate narrowing of the ostium of the superior mesenteric artery which also has a mean diameter 5 mm. Inferior mesenteric artery is widely patent without hemodynamically significant stenosis. Two right-sided renal arteries, the larger of which demonstrates very mild ostial narrowing. Single left renal artery is widely patent without hemodynamically significant stenosis. Reproductive: Prostate gland and seminal vesicles are unremarkable in appearance. Other: No significant volume of ascites.  No pneumoperitoneum. Musculoskeletal: There are no aggressive appearing lytic or blastic lesions noted in the visualized portions of the skeleton. VASCULAR MEASUREMENTS PERTINENT TO TAVR: AORTA: Minimal Aortic Diameter-18 x 16 mm Severity of Aortic Calcification-severe RIGHT PELVIS: Right Common Iliac Artery - Minimal Diameter-11.7 x 10.4 mm Tortuosity-moderate Calcification-moderate Right External Iliac Artery - Minimal Diameter-8.9 x 8.1 mm Tortuosity-severe Calcification-mild Right Common Femoral Artery - Minimal Diameter-7.8 x 6.6 mm Tortuosity-mild Calcification-severe LEFT PELVIS: Left Common Iliac Artery - Minimal Diameter-10.4 x 5.0 mm Tortuosity-severe Calcification-moderate Left External  Iliac Artery - Minimal Diameter-10.0 x 8.5 mm Tortuosity-severe Calcification-mild Left Common Femoral Artery - Minimal Diameter-7.1 x 8.3 mm Tortuosity-mild Calcification-severe Review of the MIP images confirms the above findings. IMPRESSION: 1. Vascular findings and measurements pertinent to potential TAVR procedure, as detailed above. 2. Severe thickening calcification of the aortic valve, compatible with the reported clinical history of severe aortic stenosis. 3. Aortic atherosclerosis, in addition to left main and 3 vessel coronary artery disease. Status post median sternotomy for CABG including LIMA to the LAD. 4. 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 5. Moderate ostial narrowing of the common hepatic artery (direct origin from the abdominal aorta) and the superior mesenteric artery, both of which measure 5 mm in diameter. 6. Colonic diverticulosis without evidence of acute diverticulitis at this time. 7. 7 mm nonobstructive calculus in the upper pole collecting system of left kidney. 8. Small right and trace left pleural effusions lying dependently. 9. Small pulmonary nodules in the right lung, largest of which measures only 4 mm in the right middle lobe. No follow-up needed if patient is low-risk (and has no known or suspected primary neoplasm). Non-contrast chest CT can be considered in 12 months if patient is high-risk. This recommendation follows the consensus statement: Guidelines for Management of Incidental Pulmonary Nodules Detected on CT Images: From the Fleischner Society 2017; Radiology 2017; 284:228-243. 10. Additional incidental findings, as above. Aortic Atherosclerosis (ICD10-I70.0). Electronically Signed   By: Quillian Quince  Entrikin M.D.   On: 04/04/2018 18:11   Ct Angio Abd/pel W/ And/or W/o  Result Date: 04/04/2018 CLINICAL DATA:  84 year old male with history of severe aortic stenosis. Preprocedural study prior to potential  transcatheter aortic valve replacement (TAVR). EXAM: CT ANGIOGRAPHY CHEST, ABDOMEN AND PELVIS TECHNIQUE: Multidetector CT imaging through the chest, abdomen and pelvis was performed using the standard protocol during bolus administration of intravenous contrast. Multiplanar reconstructed images and MIPs were obtained and reviewed to evaluate the vascular anatomy. CONTRAST:  139m ISOVUE-370 IOPAMIDOL (ISOVUE-370) INJECTION 76% COMPARISON:  CT the abdomen and pelvis 05/14/2010. FINDINGS: CTA CHEST FINDINGS Cardiovascular: Heart size is mildly enlarged. There is no significant pericardial fluid, thickening or pericardial calcification. There is aortic atherosclerosis, as well as atherosclerosis of the great vessels of the mediastinum and the coronary arteries, including calcified atherosclerotic plaque in the left main, left anterior descending, left circumflex and right coronary arteries. Status post median sternotomy for CABG including LIMA to the LAD. Severe thickening calcification of the aortic valve. Left-sided pacemaker with lead tips terminating in the right atrial appendage and near the right ventricular apex. Mediastinum/Lymph Nodes: No pathologically enlarged mediastinal or hilar lymph nodes. Esophagus is unremarkable in appearance. No axillary lymphadenopathy. Lungs/Pleura: Small right and trace left pleural effusions lying dependently. No acute consolidative airspace disease. Two tiny pulmonary nodules are noted in the right lung, largest of which measures 4 mm in the lateral segment of the right middle lobe (axial image 51 of series 15). No larger more suspicious appearing pulmonary nodules or masses are noted. Musculoskeletal/Soft Tissues: There are no aggressive appearing lytic or blastic lesions noted in the visualized portions of the skeleton. Median sternotomy wires. CTA ABDOMEN AND PELVIS FINDINGS Hepatobiliary: 11 mm low-attenuation lesion in segment 4A of the liver, compatible with a simple cyst.  No other larger more suspicious appearing hepatic lesions are noted. No intra or extrahepatic biliary ductal dilatation. Gallbladder is moderately distended, but otherwise unremarkable in appearance. Pancreas: No pancreatic mass. No pancreatic ductal dilatation. No pancreatic or peripancreatic fluid or inflammatory changes. Spleen: Unremarkable. Adrenals/Urinary Tract: Multiple low-attenuation lesions in both kidneys, compatible with simple cysts, measuring up to 19 mm in diameter in the upper and lower poles of the right kidney. 7 mm nonobstructive calculus in the upper pole collecting system of the left kidney. No hydroureteronephrosis. Urinary bladder is unremarkable in appearance. Stomach/Bowel: Normal appearance of the stomach. No pathologic dilatation of small bowel or colon. A few scattered colonic diverticulae are noted, particularly in the sigmoid colon, without surrounding inflammatory changes to suggest an acute diverticulitis at this time. Normal appendix. Vascular/Lymphatic: Aortic atherosclerosis with vascular findings and measurements pertinent to potential TAVR procedure, as detailed below. There is also aneurysmal dilatation of the left common iliac artery which measures up to 2.9 cm in diameter. Separate origin of the common hepatic artery directly off the aorta, with moderate ostial narrowing of this vessel where there is a mean diameter of only 5 mm. Remaining portions of the celiac axis are otherwise widely patent without hemodynamically significant stenosis. There is also moderate narrowing of the ostium of the superior mesenteric artery which also has a mean diameter 5 mm. Inferior mesenteric artery is widely patent without hemodynamically significant stenosis. Two right-sided renal arteries, the larger of which demonstrates very mild ostial narrowing. Single left renal artery is widely patent without hemodynamically significant stenosis. Reproductive: Prostate gland and seminal vesicles are  unremarkable in appearance. Other: No significant volume of ascites.  No pneumoperitoneum. Musculoskeletal: There are  no aggressive appearing lytic or blastic lesions noted in the visualized portions of the skeleton. VASCULAR MEASUREMENTS PERTINENT TO TAVR: AORTA: Minimal Aortic Diameter-18 x 16 mm Severity of Aortic Calcification-severe RIGHT PELVIS: Right Common Iliac Artery - Minimal Diameter-11.7 x 10.4 mm Tortuosity-moderate Calcification-moderate Right External Iliac Artery - Minimal Diameter-8.9 x 8.1 mm Tortuosity-severe Calcification-mild Right Common Femoral Artery - Minimal Diameter-7.8 x 6.6 mm Tortuosity-mild Calcification-severe LEFT PELVIS: Left Common Iliac Artery - Minimal Diameter-10.4 x 5.0 mm Tortuosity-severe Calcification-moderate Left External Iliac Artery - Minimal Diameter-10.0 x 8.5 mm Tortuosity-severe Calcification-mild Left Common Femoral Artery - Minimal Diameter-7.1 x 8.3 mm Tortuosity-mild Calcification-severe Review of the MIP images confirms the above findings. IMPRESSION: 1. Vascular findings and measurements pertinent to potential TAVR procedure, as detailed above. 2. Severe thickening calcification of the aortic valve, compatible with the reported clinical history of severe aortic stenosis. 3. Aortic atherosclerosis, in addition to left main and 3 vessel coronary artery disease. Status post median sternotomy for CABG including LIMA to the LAD. 4. 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 5. Moderate ostial narrowing of the common hepatic artery (direct origin from the abdominal aorta) and the superior mesenteric artery, both of which measure 5 mm in diameter. 6. Colonic diverticulosis without evidence of acute diverticulitis at this time. 7. 7 mm nonobstructive calculus in the upper pole collecting system of left kidney. 8. Small right and trace left pleural effusions lying dependently. 9. Small pulmonary nodules in the right  lung, largest of which measures only 4 mm in the right middle lobe. No follow-up needed if patient is low-risk (and has no known or suspected primary neoplasm). Non-contrast chest CT can be considered in 12 months if patient is high-risk. This recommendation follows the consensus statement: Guidelines for Management of Incidental Pulmonary Nodules Detected on CT Images: From the Fleischner Society 2017; Radiology 2017; 284:228-243. 10. Additional incidental findings, as above. Aortic Atherosclerosis (ICD10-I70.0). Electronically Signed   By: Vinnie Langton M.D.   On: 04/04/2018 18:11    Microbiology: No results found for this or any previous visit (from the past 240 hour(s)).   Labs: Basic Metabolic Panel: Recent Labs  Lab 04/03/18 0459 04/04/18 0419 04/05/18 0443 04/06/18 0309 04/07/18 0329  NA 138 137 137 139 140  K 3.3* 3.4* 3.7 4.2 3.8  CL 104 100 101 105 105  CO2 25 25 26 25 26   GLUCOSE 112* 117* 106* 118* 114*  BUN 21 23 32* 25* 28*  CREATININE 0.98 0.97 1.23 0.97 1.18  CALCIUM 8.8* 8.7* 8.9 9.3 9.2  MG  --   --  2.0  --   --    Liver Function Tests: No results for input(s): AST, ALT, ALKPHOS, BILITOT, PROT, ALBUMIN in the last 168 hours. No results for input(s): LIPASE, AMYLASE in the last 168 hours. No results for input(s): AMMONIA in the last 168 hours. CBC: Recent Labs  Lab 04/03/18 0459 04/04/18 0419 04/05/18 0443 04/06/18 0309 04/07/18 0329  WBC 3.6* 3.9* 5.0 5.0 6.0  HGB 12.1* 13.0 13.1 12.8* 12.3*  HCT 37.3* 39.6 40.0 39.4 37.6*  MCV 94.0 93.4 94.6 93.8 94.0  PLT 92* 94* 110* 98* 138*   Cardiac Enzymes: Recent Labs  Lab 04/02/18 1926  TROPONINI 0.03*   BNP: BNP (last 3 results) Recent Labs    04/02/18 1927 04/07/18 0952  BNP 1,143.9* 461.7*    ProBNP (last 3 results) No results for input(s): PROBNP in the last 8760 hours.  CBG: No results for input(s): GLUCAP in the last 168 hours.     Signed:  Domenic Polite MD.  [facility] 04/07/2018, 3:47 PM

## 2018-04-10 ENCOUNTER — Other Ambulatory Visit: Payer: Self-pay

## 2018-04-10 DIAGNOSIS — I350 Nonrheumatic aortic (valve) stenosis: Secondary | ICD-10-CM

## 2018-04-10 MED ORDER — EPINEPHRINE PF 1 MG/ML IJ SOLN
0.0000 ug/min | INTRAVENOUS | Status: DC
  Filled 2018-04-10: qty 4

## 2018-04-10 MED ORDER — SODIUM CHLORIDE 0.9 % IV SOLN
30.0000 ug/min | INTRAVENOUS | Status: DC
  Filled 2018-04-10: qty 2

## 2018-04-10 MED ORDER — MAGNESIUM SULFATE 50 % IJ SOLN
40.0000 meq | INTRAMUSCULAR | Status: DC
  Filled 2018-04-10: qty 9.85

## 2018-04-10 MED ORDER — DOPAMINE-DEXTROSE 3.2-5 MG/ML-% IV SOLN
0.0000 ug/kg/min | INTRAVENOUS | Status: DC
  Filled 2018-04-10: qty 250

## 2018-04-10 MED ORDER — SODIUM CHLORIDE 0.9 % IV SOLN
INTRAVENOUS | Status: DC
  Filled 2018-04-10: qty 30

## 2018-04-10 MED ORDER — NOREPINEPHRINE 4 MG/250ML-% IV SOLN
0.0000 ug/min | INTRAVENOUS | Status: AC
  Administered 2018-04-11: 2 ug/min via INTRAVENOUS
  Filled 2018-04-10: qty 250

## 2018-04-10 MED ORDER — DEXMEDETOMIDINE HCL IN NACL 400 MCG/100ML IV SOLN
0.1000 ug/kg/h | INTRAVENOUS | Status: AC
  Administered 2018-04-11: 1 ug/kg/h via INTRAVENOUS
  Filled 2018-04-10: qty 100

## 2018-04-10 MED ORDER — VANCOMYCIN HCL 10 G IV SOLR
1500.0000 mg | INTRAVENOUS | Status: AC
  Administered 2018-04-11: 1500 mg via INTRAVENOUS
  Filled 2018-04-10: qty 1500

## 2018-04-10 MED ORDER — POTASSIUM CHLORIDE 2 MEQ/ML IV SOLN
80.0000 meq | INTRAVENOUS | Status: DC
  Filled 2018-04-10: qty 40

## 2018-04-10 MED ORDER — SODIUM CHLORIDE 0.9 % IV SOLN
INTRAVENOUS | Status: DC
  Filled 2018-04-10: qty 1

## 2018-04-10 MED ORDER — CEFUROXIME SODIUM 1.5 G IV SOLR
1.5000 g | INTRAVENOUS | Status: AC
  Administered 2018-04-11: 1.5 g via INTRAVENOUS
  Filled 2018-04-10: qty 1.5

## 2018-04-10 MED ORDER — NITROGLYCERIN IN D5W 200-5 MCG/ML-% IV SOLN
2.0000 ug/min | INTRAVENOUS | Status: DC
  Filled 2018-04-10: qty 250

## 2018-04-11 ENCOUNTER — Inpatient Hospital Stay (HOSPITAL_COMMUNITY): Payer: Medicare Other | Admitting: Vascular Surgery

## 2018-04-11 ENCOUNTER — Ambulatory Visit (HOSPITAL_COMMUNITY): Payer: Medicare Other

## 2018-04-11 ENCOUNTER — Inpatient Hospital Stay (HOSPITAL_COMMUNITY)
Admission: RE | Admit: 2018-04-11 | Discharge: 2018-04-22 | DRG: 266 | Disposition: A | Discharge status: Home Health | Payer: Medicare Other | Attending: Surgery | Admitting: Surgery

## 2018-04-11 ENCOUNTER — Inpatient Hospital Stay (HOSPITAL_COMMUNITY): Payer: Medicare Other

## 2018-04-11 ENCOUNTER — Encounter (HOSPITAL_COMMUNITY)
Admission: RE | Disposition: A | Discharge status: Home Health | Payer: Self-pay | Source: Home / Self Care | Attending: Thoracic Surgery (Cardiothoracic Vascular Surgery)

## 2018-04-11 ENCOUNTER — Inpatient Hospital Stay: Admit: 2018-04-11 | Payer: Medicare Other | Admitting: Cardiovascular Disease

## 2018-04-11 ENCOUNTER — Other Ambulatory Visit: Payer: Self-pay

## 2018-04-11 ENCOUNTER — Encounter (HOSPITAL_COMMUNITY): Payer: Self-pay | Admitting: *Deleted

## 2018-04-11 DIAGNOSIS — N5089 Other specified disorders of the male genital organs: Secondary | ICD-10-CM | POA: Diagnosis present

## 2018-04-11 DIAGNOSIS — Z006 Encounter for examination for normal comparison and control in clinical research program: Secondary | ICD-10-CM

## 2018-04-11 DIAGNOSIS — I779 Disorder of arteries and arterioles, unspecified: Secondary | ICD-10-CM | POA: Diagnosis present

## 2018-04-11 DIAGNOSIS — N400 Enlarged prostate without lower urinary tract symptoms: Secondary | ICD-10-CM | POA: Diagnosis present

## 2018-04-11 DIAGNOSIS — Z4589 Encounter for adjustment and management of other implanted devices: Secondary | ICD-10-CM | POA: Diagnosis not present

## 2018-04-11 DIAGNOSIS — Z452 Encounter for adjustment and management of vascular access device: Secondary | ICD-10-CM | POA: Diagnosis not present

## 2018-04-11 DIAGNOSIS — Z8673 Personal history of transient ischemic attack (TIA), and cerebral infarction without residual deficits: Secondary | ICD-10-CM

## 2018-04-11 DIAGNOSIS — Z79899 Other long term (current) drug therapy: Secondary | ICD-10-CM

## 2018-04-11 DIAGNOSIS — F411 Generalized anxiety disorder: Secondary | ICD-10-CM | POA: Diagnosis present

## 2018-04-11 DIAGNOSIS — Z7901 Long term (current) use of anticoagulants: Secondary | ICD-10-CM | POA: Diagnosis not present

## 2018-04-11 DIAGNOSIS — I480 Paroxysmal atrial fibrillation: Secondary | ICD-10-CM | POA: Diagnosis present

## 2018-04-11 DIAGNOSIS — I4891 Unspecified atrial fibrillation: Secondary | ICD-10-CM | POA: Diagnosis present

## 2018-04-11 DIAGNOSIS — I739 Peripheral vascular disease, unspecified: Secondary | ICD-10-CM | POA: Diagnosis present

## 2018-04-11 DIAGNOSIS — J9811 Atelectasis: Secondary | ICD-10-CM | POA: Diagnosis not present

## 2018-04-11 DIAGNOSIS — I5033 Acute on chronic diastolic (congestive) heart failure: Secondary | ICD-10-CM | POA: Diagnosis present

## 2018-04-11 DIAGNOSIS — Z950 Presence of cardiac pacemaker: Secondary | ICD-10-CM | POA: Diagnosis not present

## 2018-04-11 DIAGNOSIS — I2510 Atherosclerotic heart disease of native coronary artery without angina pectoris: Secondary | ICD-10-CM | POA: Diagnosis present

## 2018-04-11 DIAGNOSIS — I350 Nonrheumatic aortic (valve) stenosis: Principal | ICD-10-CM | POA: Diagnosis present

## 2018-04-11 DIAGNOSIS — I700 Atherosclerosis of aorta: Secondary | ICD-10-CM | POA: Diagnosis not present

## 2018-04-11 DIAGNOSIS — R000 Tachycardia, unspecified: Secondary | ICD-10-CM | POA: Diagnosis not present

## 2018-04-11 DIAGNOSIS — Z87891 Personal history of nicotine dependence: Secondary | ICD-10-CM

## 2018-04-11 DIAGNOSIS — I509 Heart failure, unspecified: Secondary | ICD-10-CM

## 2018-04-11 DIAGNOSIS — Z4659 Encounter for fitting and adjustment of other gastrointestinal appliance and device: Secondary | ICD-10-CM

## 2018-04-11 DIAGNOSIS — Y92234 Operating room of hospital as the place of occurrence of the external cause: Secondary | ICD-10-CM | POA: Diagnosis not present

## 2018-04-11 DIAGNOSIS — D62 Acute posthemorrhagic anemia: Secondary | ICD-10-CM | POA: Diagnosis not present

## 2018-04-11 DIAGNOSIS — I10 Essential (primary) hypertension: Secondary | ICD-10-CM | POA: Diagnosis present

## 2018-04-11 DIAGNOSIS — Z7982 Long term (current) use of aspirin: Secondary | ICD-10-CM

## 2018-04-11 DIAGNOSIS — R609 Edema, unspecified: Secondary | ICD-10-CM | POA: Diagnosis not present

## 2018-04-11 DIAGNOSIS — I110 Hypertensive heart disease with heart failure: Secondary | ICD-10-CM | POA: Diagnosis present

## 2018-04-11 DIAGNOSIS — I361 Nonrheumatic tricuspid (valve) insufficiency: Secondary | ICD-10-CM | POA: Diagnosis not present

## 2018-04-11 DIAGNOSIS — Z538 Procedure and treatment not carried out for other reasons: Secondary | ICD-10-CM | POA: Diagnosis not present

## 2018-04-11 DIAGNOSIS — Z95828 Presence of other vascular implants and grafts: Secondary | ICD-10-CM

## 2018-04-11 DIAGNOSIS — Z954 Presence of other heart-valve replacement: Secondary | ICD-10-CM | POA: Diagnosis not present

## 2018-04-11 DIAGNOSIS — S35511A Injury of right iliac artery, initial encounter: Secondary | ICD-10-CM | POA: Diagnosis not present

## 2018-04-11 DIAGNOSIS — Z01818 Encounter for other preprocedural examination: Secondary | ICD-10-CM

## 2018-04-11 DIAGNOSIS — R0602 Shortness of breath: Secondary | ICD-10-CM | POA: Diagnosis present

## 2018-04-11 DIAGNOSIS — Z952 Presence of prosthetic heart valve: Secondary | ICD-10-CM

## 2018-04-11 DIAGNOSIS — Y838 Other surgical procedures as the cause of abnormal reaction of the patient, or of later complication, without mention of misadventure at the time of the procedure: Secondary | ICD-10-CM | POA: Diagnosis not present

## 2018-04-11 DIAGNOSIS — Z419 Encounter for procedure for purposes other than remedying health state, unspecified: Secondary | ICD-10-CM

## 2018-04-11 DIAGNOSIS — Z0389 Encounter for observation for other suspected diseases and conditions ruled out: Secondary | ICD-10-CM | POA: Diagnosis not present

## 2018-04-11 DIAGNOSIS — K5190 Ulcerative colitis, unspecified, without complications: Secondary | ICD-10-CM | POA: Diagnosis present

## 2018-04-11 HISTORY — DX: Unspecified malignant neoplasm of skin, unspecified: C44.90

## 2018-04-11 HISTORY — PX: ENDARTERECTOMY FEMORAL: SHX5804

## 2018-04-11 HISTORY — PX: BALLOON AORTIC VALVE VALVULOPLASTY: SHX6412

## 2018-04-11 HISTORY — DX: Presence of prosthetic heart valve: Z95.2

## 2018-04-11 HISTORY — DX: Nonrheumatic aortic (valve) stenosis: I35.0

## 2018-04-11 HISTORY — PX: INTRAOPERATIVE TRANSTHORACIC ECHOCARDIOGRAM: SHX6523

## 2018-04-11 HISTORY — PX: FEMORAL-FEMORAL BYPASS GRAFT: SHX936

## 2018-04-11 HISTORY — DX: Presence of other vascular implants and grafts: Z95.828

## 2018-04-11 LAB — POCT I-STAT, CHEM 8
BUN: 28 mg/dL — ABNORMAL HIGH (ref 8–23)
BUN: 27 mg/dL — ABNORMAL HIGH (ref 8–23)
BUN: 28 mg/dL — ABNORMAL HIGH (ref 8–23)
BUN: 31 mg/dL — AB (ref 8–23)
BUN: 32 mg/dL — AB (ref 8–23)
BUN: 38 mg/dL — ABNORMAL HIGH (ref 8–23)
BUN: 30 mg/dL — AB (ref 8–23)
CALCIUM ION: 1.14 mmol/L — AB (ref 1.15–1.40)
CHLORIDE: 104 mmol/L (ref 98–111)
CHLORIDE: 104 mmol/L (ref 98–111)
CHLORIDE: 106 mmol/L (ref 98–111)
CREATININE: 1 mg/dL (ref 0.61–1.24)
CREATININE: 0.9 mg/dL (ref 0.61–1.24)
CREATININE: 0.9 mg/dL (ref 0.61–1.24)
CREATININE: 1 mg/dL (ref 0.61–1.24)
Calcium, Ion: 1.3 mmol/L (ref 1.15–1.40)
Calcium, Ion: 1.14 mmol/L — ABNORMAL LOW (ref 1.15–1.40)
Calcium, Ion: 1.2 mmol/L (ref 1.15–1.40)
Calcium, Ion: 1.14 mmol/L — ABNORMAL LOW (ref 1.15–1.40)
Calcium, Ion: 1.15 mmol/L (ref 1.15–1.40)
Calcium, Ion: 1.16 mmol/L (ref 1.15–1.40)
Chloride: 101 mmol/L (ref 98–111)
Chloride: 104 mmol/L (ref 98–111)
Chloride: 104 mmol/L (ref 98–111)
Chloride: 104 mmol/L (ref 98–111)
Creatinine, Ser: 1 mg/dL (ref 0.61–1.24)
Creatinine, Ser: 0.9 mg/dL (ref 0.61–1.24)
Creatinine, Ser: 1.1 mg/dL (ref 0.61–1.24)
GLUCOSE: 113 mg/dL — AB (ref 70–99)
GLUCOSE: 144 mg/dL — AB (ref 70–99)
GLUCOSE: 145 mg/dL — AB (ref 70–99)
Glucose, Bld: 149 mg/dL — ABNORMAL HIGH (ref 70–99)
Glucose, Bld: 153 mg/dL — ABNORMAL HIGH (ref 70–99)
Glucose, Bld: 147 mg/dL — ABNORMAL HIGH (ref 70–99)
Glucose, Bld: 141 mg/dL — ABNORMAL HIGH (ref 70–99)
HCT: 32 % — ABNORMAL LOW (ref 39.0–52.0)
HCT: 28 % — ABNORMAL LOW (ref 39.0–52.0)
HEMATOCRIT: 26 % — AB (ref 39.0–52.0)
HEMATOCRIT: 31 % — AB (ref 39.0–52.0)
HEMATOCRIT: 29 % — AB (ref 39.0–52.0)
HEMATOCRIT: 31 % — AB (ref 39.0–52.0)
HEMATOCRIT: 27 % — AB (ref 39.0–52.0)
HEMOGLOBIN: 10.9 g/dL — AB (ref 13.0–17.0)
HEMOGLOBIN: 10.5 g/dL — AB (ref 13.0–17.0)
HEMOGLOBIN: 10.5 g/dL — AB (ref 13.0–17.0)
HEMOGLOBIN: 9.5 g/dL — AB (ref 13.0–17.0)
Hemoglobin: 8.8 g/dL — ABNORMAL LOW (ref 13.0–17.0)
Hemoglobin: 9.9 g/dL — ABNORMAL LOW (ref 13.0–17.0)
Hemoglobin: 9.2 g/dL — ABNORMAL LOW (ref 13.0–17.0)
POTASSIUM: 5.2 mmol/L — AB (ref 3.5–5.1)
POTASSIUM: 5 mmol/L (ref 3.5–5.1)
POTASSIUM: 5.2 mmol/L — AB (ref 3.5–5.1)
POTASSIUM: 4.7 mmol/L (ref 3.5–5.1)
Potassium: 4.2 mmol/L (ref 3.5–5.1)
Potassium: 4.9 mmol/L (ref 3.5–5.1)
Potassium: 5.1 mmol/L (ref 3.5–5.1)
SODIUM: 138 mmol/L (ref 135–145)
SODIUM: 139 mmol/L (ref 135–145)
Sodium: 138 mmol/L (ref 135–145)
Sodium: 138 mmol/L (ref 135–145)
Sodium: 138 mmol/L (ref 135–145)
Sodium: 139 mmol/L (ref 135–145)
Sodium: 139 mmol/L (ref 135–145)
TCO2: 23 mmol/L (ref 22–32)
TCO2: 21 mmol/L — AB (ref 22–32)
TCO2: 21 mmol/L — ABNORMAL LOW (ref 22–32)
TCO2: 23 mmol/L (ref 22–32)
TCO2: 24 mmol/L (ref 22–32)
TCO2: 24 mmol/L (ref 22–32)
TCO2: 20 mmol/L — ABNORMAL LOW (ref 22–32)

## 2018-04-11 LAB — POCT I-STAT 3, ART BLOOD GAS (G3+)
ACID-BASE DEFICIT: 4 mmol/L — AB (ref 0.0–2.0)
Acid-base deficit: 4 mmol/L — ABNORMAL HIGH (ref 0.0–2.0)
Acid-base deficit: 1 mmol/L (ref 0.0–2.0)
Acid-base deficit: 1 mmol/L (ref 0.0–2.0)
Acid-base deficit: 7 mmol/L — ABNORMAL HIGH (ref 0.0–2.0)
Acid-base deficit: 6 mmol/L — ABNORMAL HIGH (ref 0.0–2.0)
BICARBONATE: 23.9 mmol/L (ref 20.0–28.0)
BICARBONATE: 18.4 mmol/L — AB (ref 20.0–28.0)
Bicarbonate: 21.3 mmol/L (ref 20.0–28.0)
Bicarbonate: 21.7 mmol/L (ref 20.0–28.0)
Bicarbonate: 24.2 mmol/L (ref 20.0–28.0)
Bicarbonate: 19.7 mmol/L — ABNORMAL LOW (ref 20.0–28.0)
O2 SAT: 95 %
O2 Saturation: 99 %
O2 Saturation: 99 %
O2 Saturation: 100 %
O2 Saturation: 100 %
O2 Saturation: 98 %
PCO2 ART: 38.9 mmHg (ref 32.0–48.0)
PH ART: 7.304 — AB (ref 7.350–7.450)
PH ART: 7.378 (ref 7.350–7.450)
PH ART: 7.313 — AB (ref 7.350–7.450)
PO2 ART: 192 mmHg — AB (ref 83.0–108.0)
PO2 ART: 201 mmHg — AB (ref 83.0–108.0)
PO2 ART: 86 mmHg (ref 83.0–108.0)
Patient temperature: 37.3
TCO2: 23 mmol/L (ref 22–32)
TCO2: 23 mmol/L (ref 22–32)
TCO2: 25 mmol/L (ref 22–32)
TCO2: 25 mmol/L (ref 22–32)
TCO2: 19 mmol/L — ABNORMAL LOW (ref 22–32)
TCO2: 21 mmol/L — ABNORMAL LOW (ref 22–32)
pCO2 arterial: 40.8 mmHg (ref 32.0–48.0)
pCO2 arterial: 43.7 mmHg (ref 32.0–48.0)
pCO2 arterial: 41 mmHg (ref 32.0–48.0)
pCO2 arterial: 41.1 mmHg (ref 32.0–48.0)
pCO2 arterial: 35.2 mmHg (ref 32.0–48.0)
pH, Arterial: 7.326 — ABNORMAL LOW (ref 7.350–7.450)
pH, Arterial: 7.374 (ref 7.350–7.450)
pH, Arterial: 7.328 — ABNORMAL LOW (ref 7.350–7.450)
pO2, Arterial: 151 mmHg — ABNORMAL HIGH (ref 83.0–108.0)
pO2, Arterial: 132 mmHg — ABNORMAL HIGH (ref 83.0–108.0)
pO2, Arterial: 111 mmHg — ABNORMAL HIGH (ref 83.0–108.0)

## 2018-04-11 LAB — POCT I-STAT 4, (NA,K, GLUC, HGB,HCT)
Glucose, Bld: 149 mg/dL — ABNORMAL HIGH (ref 70–99)
HEMATOCRIT: 32 % — AB (ref 39.0–52.0)
HEMOGLOBIN: 10.9 g/dL — AB (ref 13.0–17.0)
Potassium: 4.7 mmol/L (ref 3.5–5.1)
Sodium: 139 mmol/L (ref 135–145)

## 2018-04-11 LAB — CBC
HCT: 34.9 % — ABNORMAL LOW (ref 39.0–52.0)
Hemoglobin: 11.6 g/dL — ABNORMAL LOW (ref 13.0–17.0)
MCH: 30.5 pg (ref 26.0–34.0)
MCHC: 33.2 g/dL (ref 30.0–36.0)
MCV: 91.8 fL (ref 78.0–100.0)
Platelets: 182 10*3/uL (ref 150–400)
RBC: 3.8 MIL/uL — ABNORMAL LOW (ref 4.22–5.81)
RDW: 15.3 % (ref 11.5–15.5)
WBC: 19.6 10*3/uL — AB (ref 4.0–10.5)

## 2018-04-11 LAB — APTT
APTT: 40 s — AB (ref 24–36)
aPTT: 40 seconds — ABNORMAL HIGH (ref 24–36)
aPTT: 34 seconds (ref 24–36)

## 2018-04-11 LAB — PREPARE RBC (CROSSMATCH)

## 2018-04-11 LAB — PROTIME-INR
INR: 1.7
INR: 1.38
PROTHROMBIN TIME: 19.8 s — AB (ref 11.4–15.2)
PROTHROMBIN TIME: 16.8 s — AB (ref 11.4–15.2)

## 2018-04-11 LAB — FIBRINOGEN: FIBRINOGEN: 304 mg/dL (ref 210–475)

## 2018-04-11 SURGERY — ECHOCARDIOGRAM, TRANSTHORACIC
Anesthesia: General | Site: Groin | Laterality: Right

## 2018-04-11 MED ORDER — LIDOCAINE HCL (PF) 1 % IJ SOLN
INTRAMUSCULAR | Status: DC | PRN
  Administered 2018-04-11: 6 mL

## 2018-04-11 MED ORDER — PANTOPRAZOLE SODIUM 40 MG PO TBEC
40.0000 mg | DELAYED_RELEASE_TABLET | Freq: Every day | ORAL | Status: DC
  Administered 2018-04-13 – 2018-04-17 (×5): 40 mg via ORAL
  Filled 2018-04-11 (×5): qty 1

## 2018-04-11 MED ORDER — HEPARIN SODIUM (PORCINE) 1000 UNIT/ML IJ SOLN
INTRAMUSCULAR | Status: DC | PRN
  Administered 2018-04-11: 2000 [IU] via INTRAVENOUS
  Administered 2018-04-11: 14000 [IU] via INTRAVENOUS

## 2018-04-11 MED ORDER — FENTANYL CITRATE (PF) 100 MCG/2ML IJ SOLN
25.0000 ug | Freq: Once | INTRAMUSCULAR | Status: AC
  Administered 2018-04-11: 25 ug via INTRAVENOUS

## 2018-04-11 MED ORDER — SODIUM CHLORIDE 0.9 % IV SOLN
1.5000 g | Freq: Two times a day (BID) | INTRAVENOUS | Status: AC
  Administered 2018-04-11 – 2018-04-13 (×4): 1.5 g via INTRAVENOUS
  Filled 2018-04-11 (×4): qty 1.5

## 2018-04-11 MED ORDER — IODIXANOL 320 MG/ML IV SOLN
INTRAVENOUS | Status: DC | PRN
  Administered 2018-04-11: 20.1 mL via INTRAVENOUS

## 2018-04-11 MED ORDER — FENTANYL CITRATE (PF) 100 MCG/2ML IJ SOLN
INTRAMUSCULAR | Status: AC
  Administered 2018-04-11: 25 ug via INTRAVENOUS
  Filled 2018-04-11: qty 2

## 2018-04-11 MED ORDER — MUPIROCIN 2 % EX OINT
TOPICAL_OINTMENT | CUTANEOUS | Status: AC
  Administered 2018-04-11: 1 via TOPICAL
  Filled 2018-04-11: qty 22

## 2018-04-11 MED ORDER — MUPIROCIN 2 % EX OINT
1.0000 "application " | TOPICAL_OINTMENT | Freq: Two times a day (BID) | CUTANEOUS | Status: DC
  Administered 2018-04-11: 1 via TOPICAL

## 2018-04-11 MED ORDER — CHLORHEXIDINE GLUCONATE 0.12% ORAL RINSE (MEDLINE KIT): 15.0000 mL | Freq: Two times a day (BID) | OROMUCOSAL | Status: DC

## 2018-04-11 MED ORDER — LACTATED RINGERS IV SOLN: INTRAVENOUS | Status: DC

## 2018-04-11 MED ORDER — LACTATED RINGERS IV SOLN
INTRAVENOUS | Status: DC | PRN
  Administered 2018-04-11: 10:00:00 via INTRAVENOUS

## 2018-04-11 MED ORDER — FENTANYL CITRATE (PF) 100 MCG/2ML IJ SOLN
50.0000 ug | INTRAMUSCULAR | Status: DC | PRN
  Administered 2018-04-11: 50 ug via INTRAVENOUS
  Filled 2018-04-11: qty 2

## 2018-04-11 MED ORDER — SODIUM CHLORIDE 0.9% FLUSH: 10.0000 mL | Freq: Two times a day (BID) | INTRAVENOUS | Status: DC

## 2018-04-11 MED ORDER — NITROGLYCERIN IN D5W 200-5 MCG/ML-% IV SOLN: 0.0000 ug/min | INTRAVENOUS | Status: DC

## 2018-04-11 MED ORDER — CHLORHEXIDINE GLUCONATE 0.12 % MT SOLN
15.0000 mL | OROMUCOSAL | Status: AC
  Administered 2018-04-11: 15 mL via OROMUCOSAL

## 2018-04-11 MED ORDER — DOCUSATE SODIUM 100 MG PO CAPS
200.0000 mg | ORAL_CAPSULE | Freq: Every day | ORAL | Status: DC
  Administered 2018-04-12 – 2018-04-13 (×2): 200 mg via ORAL
  Filled 2018-04-11 (×4): qty 2

## 2018-04-11 MED ORDER — PROPOFOL 10 MG/ML IV BOLUS
INTRAVENOUS | Status: AC
  Filled 2018-04-11: qty 20

## 2018-04-11 MED ORDER — FAMOTIDINE IN NACL 20-0.9 MG/50ML-% IV SOLN
20.0000 mg | Freq: Two times a day (BID) | INTRAVENOUS | Status: AC
  Administered 2018-04-11 (×2): 20 mg via INTRAVENOUS
  Filled 2018-04-11 (×2): qty 50

## 2018-04-11 MED ORDER — SODIUM CHLORIDE 0.9 % IV SOLN
INTRAVENOUS | Status: AC
  Filled 2018-04-11 (×3): qty 1.2

## 2018-04-11 MED ORDER — DEXMEDETOMIDINE HCL 200 MCG/2ML IV SOLN
INTRAVENOUS | Status: DC | PRN
  Administered 2018-04-11: 83.5 ug via INTRAVENOUS

## 2018-04-11 MED ORDER — FENTANYL CITRATE (PF) 100 MCG/2ML IJ SOLN
INTRAMUSCULAR | Status: DC | PRN
  Administered 2018-04-11: 50 ug via INTRAVENOUS

## 2018-04-11 MED ORDER — SODIUM CHLORIDE 0.9 % IV SOLN
INTRAVENOUS | Status: DC
  Administered 2018-04-11 (×2): via INTRAVENOUS

## 2018-04-11 MED ORDER — ALBUMIN HUMAN 5 % IV SOLN
INTRAVENOUS | Status: AC
  Administered 2018-04-11: 12.5 g
  Filled 2018-04-11: qty 250

## 2018-04-11 MED ORDER — ROCURONIUM BROMIDE 100 MG/10ML IV SOLN
INTRAVENOUS | Status: DC | PRN
  Administered 2018-04-11: 20 mg via INTRAVENOUS
  Administered 2018-04-11: 30 mg via INTRAVENOUS
  Administered 2018-04-11 (×2): 50 mg via INTRAVENOUS

## 2018-04-11 MED ORDER — DEXMEDETOMIDINE HCL IN NACL 200 MCG/50ML IV SOLN
0.0000 ug/kg/h | INTRAVENOUS | Status: DC
  Filled 2018-04-11: qty 50

## 2018-04-11 MED ORDER — CHLORHEXIDINE GLUCONATE 0.12 % MT SOLN
15.0000 mL | Freq: Once | OROMUCOSAL | Status: AC
  Administered 2018-04-11: 15 mL via OROMUCOSAL
  Filled 2018-04-11: qty 15

## 2018-04-11 MED ORDER — ONDANSETRON HCL 4 MG/2ML IJ SOLN
INTRAMUSCULAR | Status: DC | PRN
  Administered 2018-04-11: 4 mg via INTRAVENOUS

## 2018-04-11 MED ORDER — BISACODYL 5 MG PO TBEC
10.0000 mg | DELAYED_RELEASE_TABLET | Freq: Every day | ORAL | Status: DC
  Administered 2018-04-12 – 2018-04-14 (×3): 10 mg via ORAL
  Filled 2018-04-11 (×4): qty 2

## 2018-04-11 MED ORDER — PHENYLEPHRINE HCL-NACL 20-0.9 MG/250ML-% IV SOLN
0.0000 ug/min | INTRAVENOUS | Status: DC
  Filled 2018-04-11: qty 250

## 2018-04-11 MED ORDER — NOREPINEPHRINE 4 MG/250ML-% IV SOLN
0.0000 ug/min | INTRAVENOUS | Status: DC
  Filled 2018-04-11 (×2): qty 250

## 2018-04-11 MED ORDER — LATANOPROST 0.005 % OP SOLN
1.0000 [drp] | Freq: Every day | OPHTHALMIC | Status: DC
  Administered 2018-04-11 – 2018-04-17 (×7): 1 [drp] via OPHTHALMIC
  Filled 2018-04-11: qty 2.5

## 2018-04-11 MED ORDER — CHLORHEXIDINE GLUCONATE CLOTH 2 % EX PADS
6.0000 | MEDICATED_PAD | Freq: Every day | CUTANEOUS | Status: DC
  Administered 2018-04-11 – 2018-04-12 (×2): 6 via TOPICAL

## 2018-04-11 MED ORDER — CHLORHEXIDINE GLUCONATE 4 % EX LIQD: 60.0000 mL | Freq: Once | CUTANEOUS | Status: DC

## 2018-04-11 MED ORDER — MORPHINE SULFATE (PF) 2 MG/ML IV SOLN: 1.0000 mg | INTRAVENOUS | Status: DC | PRN

## 2018-04-11 MED ORDER — SODIUM CHLORIDE 0.9% IV SOLUTION: Freq: Once | INTRAVENOUS | Status: DC

## 2018-04-11 MED ORDER — SODIUM CHLORIDE 0.9 % IV SOLN
INTRAVENOUS | Status: DC | PRN
  Administered 2018-04-11: 1500 mL

## 2018-04-11 MED ORDER — ACETAMINOPHEN 650 MG RE SUPP: 650.0000 mg | Freq: Once | RECTAL | Status: AC

## 2018-04-11 MED ORDER — TRAMADOL HCL 50 MG PO TABS
50.0000 mg | ORAL_TABLET | ORAL | Status: DC | PRN
  Administered 2018-04-12: 50 mg via ORAL
  Administered 2018-04-13: 100 mg via ORAL
  Administered 2018-04-13: 50 mg via ORAL
  Administered 2018-04-16: 100 mg via ORAL
  Filled 2018-04-11 (×2): qty 1
  Filled 2018-04-11 (×2): qty 2

## 2018-04-11 MED ORDER — PHENYLEPHRINE HCL 10 MG/ML IJ SOLN
INTRAMUSCULAR | Status: DC | PRN
  Administered 2018-04-11 (×4): 80 ug via INTRAVENOUS
  Administered 2018-04-11: 40 ug via INTRAVENOUS
  Administered 2018-04-11: 80 ug via INTRAVENOUS

## 2018-04-11 MED ORDER — SODIUM CHLORIDE 0.9 % IV SOLN: 250.0000 mL | INTRAVENOUS | Status: DC

## 2018-04-11 MED ORDER — MIDAZOLAM HCL 2 MG/2ML IJ SOLN: 2.0000 mg | INTRAMUSCULAR | Status: DC | PRN

## 2018-04-11 MED ORDER — ACETAMINOPHEN 160 MG/5ML PO SOLN: 1000.0000 mg | Freq: Four times a day (QID) | ORAL | Status: DC

## 2018-04-11 MED ORDER — DEXMEDETOMIDINE HCL IN NACL 400 MCG/100ML IV SOLN
0.4000 ug/kg/h | INTRAVENOUS | Status: AC
  Administered 2018-04-11: 0.4 ug/kg/h via INTRAVENOUS

## 2018-04-11 MED ORDER — SODIUM CHLORIDE 0.9% FLUSH: 3.0000 mL | INTRAVENOUS | Status: DC | PRN

## 2018-04-11 MED ORDER — BISACODYL 10 MG RE SUPP: 10.0000 mg | Freq: Every day | RECTAL | Status: DC

## 2018-04-11 MED ORDER — ORAL CARE MOUTH RINSE
15.0000 mL | Freq: Two times a day (BID) | OROMUCOSAL | Status: DC
  Administered 2018-04-11 – 2018-04-12 (×3): 15 mL via OROMUCOSAL

## 2018-04-11 MED ORDER — PROTAMINE SULFATE 10 MG/ML IV SOLN
INTRAVENOUS | Status: DC | PRN
  Administered 2018-04-11: 140 mg via INTRAVENOUS

## 2018-04-11 MED ORDER — ALBUMIN HUMAN 5 % IV SOLN
INTRAVENOUS | Status: DC | PRN
  Administered 2018-04-11 (×2): via INTRAVENOUS

## 2018-04-11 MED ORDER — ACETAMINOPHEN 500 MG PO TABS
1000.0000 mg | ORAL_TABLET | Freq: Four times a day (QID) | ORAL | Status: AC
  Administered 2018-04-11 – 2018-04-16 (×18): 1000 mg via ORAL
  Filled 2018-04-11 (×18): qty 2

## 2018-04-11 MED ORDER — PROPOFOL 10 MG/ML IV BOLUS
INTRAVENOUS | Status: DC | PRN
  Administered 2018-04-11: 30 mg via INTRAVENOUS
  Administered 2018-04-11: 10 mg via INTRAVENOUS
  Administered 2018-04-11: 15 mg via INTRAVENOUS
  Administered 2018-04-11 (×2): 10 mg via INTRAVENOUS
  Administered 2018-04-11: 5 mg via INTRAVENOUS
  Administered 2018-04-11: 50 mg via INTRAVENOUS
  Administered 2018-04-11: 10 mg via INTRAVENOUS
  Administered 2018-04-11: 20 mg via INTRAVENOUS

## 2018-04-11 MED ORDER — METOPROLOL TARTRATE 5 MG/5ML IV SOLN
2.5000 mg | INTRAVENOUS | Status: DC | PRN
  Administered 2018-04-13: 5 mg via INTRAVENOUS
  Filled 2018-04-11: qty 5

## 2018-04-11 MED ORDER — ORAL CARE MOUTH RINSE: 15.0000 mL | OROMUCOSAL | Status: DC

## 2018-04-11 MED ORDER — SODIUM CHLORIDE 0.9 % IV SOLN: INTRAVENOUS | Status: DC

## 2018-04-11 MED ORDER — SODIUM CHLORIDE 0.45 % IV SOLN: INTRAVENOUS | Status: DC | PRN

## 2018-04-11 MED ORDER — LACTATED RINGERS IV SOLN: 500.0000 mL | Freq: Once | INTRAVENOUS | Status: DC | PRN

## 2018-04-11 MED ORDER — FENTANYL CITRATE (PF) 250 MCG/5ML IJ SOLN
INTRAMUSCULAR | Status: AC
  Filled 2018-04-11: qty 5

## 2018-04-11 MED ORDER — ORAL CARE MOUTH RINSE
15.0000 mL | OROMUCOSAL | Status: DC
  Administered 2018-04-11: 15 mL via OROMUCOSAL

## 2018-04-11 MED ORDER — HEMOSTATIC AGENTS (NO CHARGE) OPTIME
TOPICAL | Status: DC | PRN
  Administered 2018-04-11: 1 via TOPICAL

## 2018-04-11 MED ORDER — VANCOMYCIN HCL IN DEXTROSE 1-5 GM/200ML-% IV SOLN
1000.0000 mg | Freq: Once | INTRAVENOUS | Status: AC
  Administered 2018-04-11: 1000 mg via INTRAVENOUS
  Filled 2018-04-11: qty 200

## 2018-04-11 MED ORDER — ALBUMIN HUMAN 5 % IV SOLN
250.0000 mL | INTRAVENOUS | Status: AC | PRN
  Filled 2018-04-11: qty 250

## 2018-04-11 MED ORDER — ONDANSETRON HCL 4 MG/2ML IJ SOLN: 4.0000 mg | Freq: Four times a day (QID) | INTRAMUSCULAR | Status: DC | PRN

## 2018-04-11 MED ORDER — OXYCODONE HCL 5 MG PO TABS
5.0000 mg | ORAL_TABLET | ORAL | Status: DC | PRN
  Filled 2018-04-11: qty 1

## 2018-04-11 MED ORDER — LIDOCAINE HCL (PF) 1 % IJ SOLN
INTRAMUSCULAR | Status: AC
  Filled 2018-04-11: qty 10

## 2018-04-11 MED ORDER — ACETAMINOPHEN 160 MG/5ML PO SOLN
650.0000 mg | Freq: Once | ORAL | Status: AC
  Administered 2018-04-11: 650 mg
  Filled 2018-04-11: qty 20.3

## 2018-04-11 MED ORDER — FENTANYL CITRATE (PF) 250 MCG/5ML IJ SOLN
INTRAMUSCULAR | Status: DC | PRN
  Administered 2018-04-11 (×2): 50 ug via INTRAVENOUS

## 2018-04-11 MED ORDER — SODIUM CHLORIDE 0.9% FLUSH
3.0000 mL | Freq: Two times a day (BID) | INTRAVENOUS | Status: DC
  Administered 2018-04-12 – 2018-04-17 (×11): 3 mL via INTRAVENOUS

## 2018-04-11 MED ORDER — MIDAZOLAM HCL 2 MG/2ML IJ SOLN
INTRAMUSCULAR | Status: AC
  Filled 2018-04-11: qty 2

## 2018-04-11 MED ORDER — SODIUM CHLORIDE 0.9 % IV SOLN
INTRAVENOUS | Status: DC
  Administered 2018-04-11 (×2): via INTRAVENOUS

## 2018-04-11 MED FILL — Heparin Sodium (Porcine) Inj 1000 Unit/ML: INTRAMUSCULAR | Qty: 30 | Status: AC

## 2018-04-11 MED FILL — Magnesium Sulfate Inj 50%: INTRAMUSCULAR | Qty: 10 | Status: AC

## 2018-04-11 MED FILL — Potassium Chloride Inj 2 mEq/ML: INTRAVENOUS | Qty: 40 | Status: AC

## 2018-04-11 SURGICAL SUPPLY — 121 items
ADH SKN CLS APL DERMABOND .7 (GAUZE/BANDAGES/DRESSINGS) ×5
BAG DECANTER FOR FLEXI CONT (MISCELLANEOUS) IMPLANT
BAG SNAP BAND KOVER 36X36 (MISCELLANEOUS) ×7 IMPLANT
BLADE CLIPPER SURG (BLADE) IMPLANT
BLADE STERNUM SYSTEM 6 (BLADE) IMPLANT
CABLE ADAPT CONN TEMP 6FT (ADAPTER) ×7 IMPLANT
CANISTER SUCT 3000ML PPV (MISCELLANEOUS) IMPLANT
CANNULA FEM VENOUS REMOTE 22FR (CANNULA) IMPLANT
CANNULA OPTISITE PERFUSION 16F (CANNULA) IMPLANT
CANNULA OPTISITE PERFUSION 18F (CANNULA) IMPLANT
CANNULA VESSEL 3MM 2 BLNT TIP (CANNULA) ×4 IMPLANT
CATH DIAG EXPO 6F FR4 (CATHETERS) ×4 IMPLANT
CATH DIAG EXPO 6F VENT PIG 145 (CATHETERS) ×14 IMPLANT
CATH EXPO 5FR AL1 (CATHETERS) IMPLANT
CATH INFINITI 6F AL2 (CATHETERS) IMPLANT
CATH S G BIP PACING (SET/KITS/TRAYS/PACK) ×7 IMPLANT
CLEANER TIP ELECTROSURG 2X2 (MISCELLANEOUS) ×10 IMPLANT
CLIP VESOCCLUDE MED 24/CT (CLIP) ×19 IMPLANT
CLIP VESOCCLUDE SM WIDE 24/CT (CLIP) ×22 IMPLANT
CONNECTOR Y ATS VAC SYSTEM (MISCELLANEOUS) ×3 IMPLANT
CONT SPEC 4OZ CLIKSEAL STRL BL (MISCELLANEOUS) ×14 IMPLANT
COVER BACK TABLE 80X110 HD (DRAPES) ×14 IMPLANT
COVER DOME SNAP 22 D (MISCELLANEOUS) ×5 IMPLANT
CRADLE DONUT ADULT HEAD (MISCELLANEOUS) ×7 IMPLANT
CUTTER ECHEON FLEX ENDO 45 340 (ENDOMECHANICALS) ×3 IMPLANT
DERMABOND ADVANCED (GAUZE/BANDAGES/DRESSINGS) ×2
DERMABOND ADVANCED .7 DNX12 (GAUZE/BANDAGES/DRESSINGS) ×5 IMPLANT
DEVICE CLOSURE PERCLS PRGLD 6F (VASCULAR PRODUCTS) ×10 IMPLANT
DRAPE INCISE IOBAN 66X45 STRL (DRAPES) IMPLANT
DRESSING PREVENA PLUS CUSTOM (GAUZE/BANDAGES/DRESSINGS) ×3 IMPLANT
DRSG PREVENA PLUS CUSTOM (GAUZE/BANDAGES/DRESSINGS) ×7
DRSG TEGADERM 4X4.75 (GAUZE/BANDAGES/DRESSINGS) ×11 IMPLANT
ELECT CAUTERY BLADE 6.4 (BLADE) ×5 IMPLANT
ELECT REM PT RETURN 9FT ADLT (ELECTROSURGICAL) ×14
ELECTRODE REM PT RTRN 9FT ADLT (ELECTROSURGICAL) ×10 IMPLANT
FELT TEFLON 1X6 (MISCELLANEOUS) ×8 IMPLANT
FELT TEFLON 6X6 (MISCELLANEOUS) ×7 IMPLANT
FEMORAL VENOUS CANN RAP (CANNULA) IMPLANT
GAUZE SPONGE 4X4 12PLY STRL (GAUZE/BANDAGES/DRESSINGS) ×7 IMPLANT
GLOVE BIO SURGEON STRL SZ7.5 (GLOVE) IMPLANT
GLOVE BIO SURGEON STRL SZ8 (GLOVE) IMPLANT
GLOVE EUDERMIC 7 POWDERFREE (GLOVE) IMPLANT
GLOVE ORTHO TXT STRL SZ7.5 (GLOVE) IMPLANT
GOWN STRL REUS W/ TWL LRG LVL3 (GOWN DISPOSABLE) IMPLANT
GOWN STRL REUS W/ TWL XL LVL3 (GOWN DISPOSABLE) ×5 IMPLANT
GOWN STRL REUS W/TWL LRG LVL3 (GOWN DISPOSABLE)
GOWN STRL REUS W/TWL XL LVL3 (GOWN DISPOSABLE) ×7
GRAFT BALLN CATH 65CM (STENTS) ×3 IMPLANT
GRAFT HEMASHIELD 8MM (Vascular Products) ×7 IMPLANT
GRAFT VASC STRG 30X8KNIT (Vascular Products) ×1 IMPLANT
GUIDEWIRE SAF TJ AMPL .035X180 (WIRE) ×7 IMPLANT
GUIDEWIRE SAFE TJ AMPLATZ EXST (WIRE) ×11 IMPLANT
GUIDEWIRE STRAIGHT .035 260CM (WIRE) ×7 IMPLANT
INSERT FOGARTY SM (MISCELLANEOUS) ×5 IMPLANT
KIT BASIN OR (CUSTOM PROCEDURE TRAY) ×7 IMPLANT
KIT DILATOR VASC 18G NDL (KITS) IMPLANT
KIT HEART LEFT (KITS) ×7 IMPLANT
KIT PREVENA INCISION MGT 13 (CANNISTER) ×6 IMPLANT
KIT SUCTION CATH 14FR (SUCTIONS) ×14 IMPLANT
KIT TURNOVER KIT B (KITS) ×7 IMPLANT
LOOP VESSEL MAXI BLUE (MISCELLANEOUS) ×12 IMPLANT
LOOP VESSEL MINI RED (MISCELLANEOUS) ×8 IMPLANT
NDL PERC 18GX7CM (NEEDLE) ×2 IMPLANT
NEEDLE PERC 18GX7CM (NEEDLE) ×7 IMPLANT
NS IRRIG 1000ML POUR BTL (IV SOLUTION) ×21 IMPLANT
PACK ENDOVASCULAR (PACKS) ×7 IMPLANT
PAD ARMBOARD 7.5X6 YLW CONV (MISCELLANEOUS) ×14 IMPLANT
PAD ELECT DEFIB RADIOL ZOLL (MISCELLANEOUS) ×7 IMPLANT
PENCIL BUTTON HOLSTER BLD 10FT (ELECTRODE) ×7 IMPLANT
PERCLOSE PROGLIDE 6F (VASCULAR PRODUCTS) ×14
POWDER SURGICEL 3.0 GRAM (HEMOSTASIS) ×3 IMPLANT
RELOAD STAPLE 45 GOLD REG/THCK (STAPLE) IMPLANT
SET MICROPUNCTURE 5F STIFF (MISCELLANEOUS) ×7 IMPLANT
SHEATH BRITE TIP 6FR 35CM (SHEATH) ×7 IMPLANT
SHEATH PINNACLE 6F 10CM (SHEATH) IMPLANT
SHEATH PINNACLE 8F 10CM (SHEATH) ×7 IMPLANT
SLEEVE REPOSITIONING LENGTH 30 (MISCELLANEOUS) ×12 IMPLANT
SPONGE LAP 4X18 RFD (DISPOSABLE) ×7 IMPLANT
STAPLE RELOAD 45MM GOLD (STAPLE) ×7 IMPLANT
STENT GRAFT BALLN CATH 65CM (STENTS) ×2
STOPCOCK MORSE 400PSI 3WAY (MISCELLANEOUS) ×18 IMPLANT
SUT ETHIBOND X763 2 0 SH 1 (SUTURE) IMPLANT
SUT GORETEX CV 4 TH 22 36 (SUTURE) IMPLANT
SUT GORETEX CV4 TH-18 (SUTURE) IMPLANT
SUT MNCRL AB 3-0 PS2 18 (SUTURE) IMPLANT
SUT PROLENE 3 0 SH DA (SUTURE) ×12 IMPLANT
SUT PROLENE 4 0 RB 1 (SUTURE) ×21
SUT PROLENE 4-0 RB1 .5 CRCL 36 (SUTURE) ×12 IMPLANT
SUT PROLENE 5 0 C 1 24 (SUTURE) ×24 IMPLANT
SUT PROLENE 5 0 C 1 36 (SUTURE) IMPLANT
SUT PROLENE 6 0 C 1 30 (SUTURE) IMPLANT
SUT SILK  1 MH (SUTURE) ×2
SUT SILK 1 MH (SUTURE) ×5 IMPLANT
SUT SILK 2 0 (SUTURE) ×14
SUT SILK 2 0 TIES 10X30 (SUTURE) ×5 IMPLANT
SUT SILK 2-0 18XBRD TIE 12 (SUTURE) ×4 IMPLANT
SUT SILK 3 0 (SUTURE) ×7
SUT SILK 3-0 18XBRD TIE 12 (SUTURE) ×2 IMPLANT
SUT SILK 4 0 (SUTURE) ×14
SUT SILK 4-0 18XBRD TIE 12 (SUTURE) ×2 IMPLANT
SUT VIC AB 1 CTX 18 (SUTURE) ×12 IMPLANT
SUT VIC AB 2-0 CT1 27 (SUTURE) ×21
SUT VIC AB 2-0 CT1 TAPERPNT 27 (SUTURE) ×6 IMPLANT
SUT VIC AB 2-0 CTX 36 (SUTURE) IMPLANT
SUT VIC AB 3-0 SH 27 (SUTURE) ×21
SUT VIC AB 3-0 SH 27X BRD (SUTURE) ×12 IMPLANT
SUT VIC AB 3-0 SH 8-18 (SUTURE) ×12 IMPLANT
SUT VIC AB 4-0 PS2 18 (SUTURE) ×6 IMPLANT
SYR 50ML LL SCALE MARK (SYRINGE) ×7 IMPLANT
SYR BULB IRRIGATION 50ML (SYRINGE) ×3 IMPLANT
SYR CONTROL 10ML LL (SYRINGE) IMPLANT
TAPE CLOTH SURG 4X10 WHT LF (GAUZE/BANDAGES/DRESSINGS) ×6 IMPLANT
TOWEL GREEN STERILE (TOWEL DISPOSABLE) ×14 IMPLANT
TRANSDUCER W/STOPCOCK (MISCELLANEOUS) ×14 IMPLANT
TRAY FOLEY SLVR 14FR TEMP STAT (SET/KITS/TRAYS/PACK) IMPLANT
TUBE SUCT INTRACARD DLP 20F (MISCELLANEOUS) IMPLANT
VALVE HEART TRANSCATH SZ3 26MM (Prosthesis & Implant Heart) IMPLANT
WIRE .035 3MM-J 145CM (WIRE) ×7 IMPLANT
WIRE BENTSON .035X145CM (WIRE) ×7 IMPLANT
WIRE HI TORQ VERSACORE J 260CM (WIRE) ×4 IMPLANT
WIRE STIFF LUNDERQUIST 260CM (WIRE) ×3 IMPLANT

## 2018-04-11 NOTE — Progress Notes (Signed)
RT NOTES: Rapid wean protocol initiated 

## 2018-04-11 NOTE — Anesthesia Procedure Notes (Addendum)
Central Venous Catheter Insertion Performed by: Oleta Mouse, MD, anesthesiologist Start/End8/13/2019 9:16 AM, 04/11/2018 9:21 AM Patient location: Pre-op. Preanesthetic checklist: patient identified, IV checked, site marked, risks and benefits discussed, surgical consent, monitors and equipment checked, pre-op evaluation, timeout performed and anesthesia consent Lidocaine 1% used for infiltration and patient sedated Hand hygiene performed  and maximum sterile barriers used  Catheter size: 8 Fr Total catheter length 16. Central line was placed.Double lumen Procedure performed using ultrasound guided technique. Ultrasound Notes:image(s) printed for medical record Attempts: 1 Following insertion, dressing applied, line sutured and Biopatch. Post procedure assessment: blood return through all ports, free fluid flow and no air  Patient tolerated the procedure well with no immediate complications.

## 2018-04-11 NOTE — Op Note (Addendum)
[facility]   MULTIDISCIPLINARY HEART VALVE TEAM   CARDIOTHORACIC SURGERY OPERATIVE NOTE   Date of Procedure:  04/11/2018  Preoperative Diagnosis: Severe Aortic Stenosis   Postoperative Diagnosis: Same   Procedure:   1. Balloon Aortic Valvuloplasty 2. Emergent Repair (Oversewing) of Right External Iliac Artery 3. Left-to-Right Femoral-Femoral Artery Bypass    Co-Surgeons (procedure 1):  Clarence H. Roxy Manns, MD and Sherren Mocha, MD  Co-Surgeons (procedures 2 and 3): Valentina Gu. Roxy Manns, MD and Deitra Mayo, MD  Anesthesiologist:    Laurie Panda, MD  Pre-operative Echo Findings:  Severe aortic stenosis  Normal left ventricular systolic function  Complications:  Injury of right external iliac artery requiring emergent open surgical repair  Planned TAVR aborted    BRIEF CLINICAL NOTE AND INDICATIONS FOR SURGERY  The patient is a 84 year old gentleman with a history of hypertension, paroxysmal atrial fibrillation on Eliquis, dyslipidemia, cerebrovascular disease status post CVA in 2001 followed by right carotid endarterectomy, complete heart block status post permanent pacemaker placement in 2006, coronary disease status post coronary bypass graft surgery by Dr. Roxan Hockey in 1989, and known severe aortic stenosis.  This has been followed by Dr. Marlou Porch.  An echocardiogram in 07/2016 showed severe aortic stenosis with a mean gradient of 47 mmHg.  The patient was not having any symptoms at that time refused further work-up.  He had a repeat echo in 05/2017 which showed no significant change in the mean gradient of 41 mmHg with normal left ventricular systolic function.  The patient remained asymptomatic.  Over the past year or so he has noted some exertional shortness of breath and fatigue but has not significantly affected his activity level.  He said that he was going to the gym daily and working on aerobic exercise machines such as a recumbent bicycle.  He had  another echocardiogram on 12/20/2017 which showed no significant change in the severe aortic stenosis with a mean gradient of 40 mmHg and normal left ventricular ejection fraction of 55 to 60%.  Then last Sunday he noticed worsening shortness of breath after he woke up from a nap and could not walk to the bathroom without significant shortness of breath.  His family called EMS and in the emergency department his troponin was 0.03.  Chest x-ray showed pulmonary edema.  A repeat echocardiogram showed critical aortic stenosis with a mean gradient of 53 mmHg and a peak gradient of 88 mmHg.  The dimensionless index was 0.26 with a calculated valve area of 0.73 cm.  Left ventricular systolic function remain normal.  He improved with intravenous diuresis.    The patient subsequently underwent further diagnostic testing including cardiac catheterization which revealed continued patency of all previous bypass grafts placed at the time of the patient's original coronary artery bypass surgery.  CT angiography was performed to evaluate the feasibility of transcatheter aortic valve replacement.  During the course of the patient's preoperative work up they have been evaluated comprehensively by a multidisciplinary team of specialists including Dr. Cyndia Bent and Dr. Angelena Form from the [facility] in the [facility].  The patient has been counseled extensively as to the relative risks and benefits of all options for the treatment of severe aortic stenosis including long term medical therapy, conventional surgery for aortic valve replacement, and transcatheter aortic valve replacement.  The patient is not felt to be candidate for conventional surgical aortic valve replacement via redo median sternotomy with or without redo coronary artery bypass grafting because  of his severely advanced age.  The patient elected to proceed with transcatheter aortic valve replacement.  He was  discharged from the hospital for several days and returned on the morning of surgery.  The patient was again seen briefly in the preop holding area by myself and Dr. Burt Knack.  All questions have been answered, and the patient provides full informed consent for the operation as planned.   DETAILS OF THE OPERATIVE PROCEDURE  PREPARATION:    The patient is brought to the operating room on the above mentioned date and central monitoring was established by the anesthesia team including placement of a central venous line and radial arterial line. The patient is placed in the supine position on the operating table.  Intravenous antibiotics are administered. The patient is initially monitored closely under conscious sedation  Baseline transthoracic echocardiogram was performed. The patient's chest, abdomen, both groins, and both lower extremities are prepared and draped in a sterile manner. A time out procedure is performed.   PERIPHERAL ACCESS:    Using the modified Seldinger technique, femoral arterial and venous access was obtained with placement of 6 Fr sheaths on the left side.  A pigtail diagnostic catheter was passed through the left arterial sheath under fluoroscopic guidance into the aortic root.  A temporary transvenous pacemaker catheter was passed through the left femoral venous sheath under fluoroscopic guidance into the right ventricle.  The pacemaker was tested to ensure stable lead placement and pacemaker capture. Aortic root angiography was performed in order to determine the optimal angiographic angle for valve deployment.   TRANSFEMORAL ACCESS:   Percutaneous transfemoral access and sheath placement was performed using ultrasound guidance.  The right common femoral artery was cannulated using a micropuncture needle and appropriate location was verified using hand injection angiogram.  A pair of Abbott Perclose percutaneous closure devices were placed and a 6 French sheath replaced into  the femoral artery.  The patient was heparinized systemically and ACT verified > 250 seconds.    A 14 Fr transfemoral E-sheath was introduced into the right common femoral artery after progressively dilating over an Amplatz superstiff wire. An AL-2 catheter was used to direct a straight-tip exchange length wire across the native aortic valve into the left ventricle. This was exchanged out for a pigtail catheter and position was confirmed in the LV apex.  Simultaneous LV and Ao pressures were recorded.  The pigtail catheter was exchanged for an Amplatz Extra-stiff wire in the LV apex.  Echocardiography was utilized to confirm appropriate wire position and no sign of entanglement in the mitral subvalvular apparatus.   BALLOON AORTIC VALVULOPLASTY:   Balloon aortic valvuloplasty was performed using a 23 mm valvuloplasty balloon.  Once optimal position was achieved, BAV was done under rapid ventricular pacing. The patient recovered well hemodynamically.    ABORTED TRANSCATHETER HEART VALVE DEPLOYMENT:   An Edwards Sapien 3 transcatheter heart valve (size 26 mm, model #[id number]) was prepared and crimped per manufacturer's guidelines, and the proper orientation of the valve is confirmed on the [employer] delivery system. The valve was advanced through the introducer sheath.  As the valve passed through the introducer sheath into the external iliac and common iliac arteries there was considerable resistance because of the extreme tortuosity of the patient's native vessels.  After a period of time attempts to advance the valve further were unsuccessful.  Under fluoroscopy it became apparent that the introducer sheath had split posteriorly and the valve itself was no longer contained within the sheath.  Attempts to withdraw the valve into the sheath were unsuccessful.  At this point it appears clear that there has been a potentially life-threatening injury to the external iliac artery, and plans to proceed  with TAVR are aborted pending emergent surgical repair.     REPAIR OF EXTERNAL ILIAC ARTERY:    Intraoperative consultation with vascular surgery team is requested and Dr. Scot Dock responds promptly and participates directly with the remainder of the procedure.  General endotracheal anesthesia is induced under the care and direction of Dr. Jackolyn Confer.  A longitudinal incision is made overlying the right common femoral artery and extended proximally.  The inguinal ligament is divided to facilitate further proximal exposure and retro-peritoneal dissection is extended to expose the external iliac artery.  At this juncture it is apparent there is extensive injury to the external iliac artery.    The pigtail catheter placed through the left femoral artery sheath was removed over a guidewire.  The arterial sheath was removed, leaving the guidewire in place.  A MOB aortic occlusion balloon was passed over the guidewire through the left common femoral artery and positioned immediately above the aortic bifurcation.  The occlusion balloon was inflated.  Exposure proximally and distally of the external iliac artery reveals essentially complete disruption of the external iliac artery with intussusception of the endothelial layers of the artery onto the introducing sheath and the transcatheter heart valve.  The anterior surface of the artery is is opened until the entire introducing sheath and valve can be removed.  The existing wires left in place but pulled back so the distal tip is in the aorta.  A sponge stick is utilized to control the proximal transected end of the external iliac artery.   The distal portion of the common femoral artery is exposed until an area can be satisfactorily controlled with a vascular clamp.  This requires dissection to the level of the bifurcation of the common femoral artery because of extreme calcification throughout the majority of the common femoral artery.  Dissection is continued  proximally until the proximal extent of the injury to the external iliac artery is identified.  This is notably quite posterior because of the extreme tortuosity of the patient's vessels.  However, the vessel appears intact below the level of the bifurcation of the common iliac artery.  An aortic vascular clamp was placed across the proximal end of the external iliac artery.  An attempt to perform straight graft repair/replacement of the external iliac artery is felt to be impossible due to the poor quality of the proximal end of the transected external iliac artery and the extreme depth of the wound.  The proximal portion of the external iliac artery is oversewn using a 2 layer closure of running 3-0 Prolene suture.  Subsequently a vascular stapler is fired beyond the level of the Prolene repair to ensure hemostasis.  The aortic occlusion balloon is deflated and proximal hemostasis is verified.  The retroperitoneal wound is packed with dry lap sponges.   LEFT-TO-RIGHT FEMORAL-FEMORAL BYPASS:   Restoration of normal flow to the right lower extremity is accomplished by left to right femoral femoral bypass graft performed by Dr. Scot Dock.  Details of this portion of the procedure are dictated in a separate note.  Briefly, a longitudinal incision is made in the left groin and the left common femoral artery is exposed.  Proximal distal control was ascertained.  An 8 mm Dacron graft is tunneled through the subcutaneous tissues between the left and right groin  incisions.  The proximal anastomosis is performed in end-to-side fashion between the vascular graft and the left common femoral artery.  Excellent inflow is verified.  The right common femoral artery is prepared for grafting at the level of the femoral artery bifurcation.  Endarterectomy of the posterior wall of the common femoral artery was required due to severe atherosclerotic disease.  After completion of the distal anastomosis normal flow was restored to  the right lower extremity.   PROCEDURE COMPLETION:   Protamine is administered to reverse the anticoagulation.  Careful examination of both wounds is performed to ensure satisfactory hemostasis.  The inguinal ligament and the abdominal fascia in the superior aspect of the right groin wound reapproximated using interrupted #1 Vicryl sutures.  Both groin incisions are subsequently closed in multiple layers including interrupted #1 Vicryl sutures for the deep layers followed by interrupted 3-0 Vicryl sutures for the dermis.  Skin incisions are closed with skin staples.  The patient tolerated the procedure remarkably well.  During the initial events surrounding injury to the external iliac artery there was a brief period of hypotension with systolic blood pressure in the 70s, with total duration less than 2 minutes.  During the remainder of the procedure the patient remained reasonably stable despite estimated blood loss of 2500 mL.  Patient received a total of 3 units packed red blood cells intraoperatively.  The patient was transferred directly from the operating room to the intensive care unit intubated on mechanical ventilation in stable hemodynamic condition.  Surgical instrument counts were not possible due to the emergent need for opening a variety of surgical instruments during the procedure.  Radiograph overlying the surgical incision was obtained prior to leaving the operating room to verify no retained instruments.  All sponge counts were correct.  The patient received a total of 25 mL of intravenous contrast during the procedure.   Rexene Alberts, MD 04/11/2018 3:59 PM

## 2018-04-11 NOTE — Progress Notes (Signed)
Spoke with Erlene Quan with [employer] about patients device. Brandon instructed [place] to call him if we needed him prior to procedure.

## 2018-04-11 NOTE — Procedures (Signed)
Extubation Procedure Note  Patient Details:   Name: Dalton Townsend DOB: [birth year] MRN: [id number]   Airway Documentation:  Airway 7.5 mm (Active)  Secured at (cm) 23 cm 04/11/2018  3:39 PM  Measured From Lips 04/11/2018  3:39 PM  Secured Location Right 04/11/2018  3:39 PM  Secured By Pink Tape 04/11/2018  3:39 PM  Cuff Pressure (cm H2O) 30 cm H2O 04/11/2018  3:39 PM  Site Condition Dry 04/11/2018  3:39 PM   Vent end date: 04/11/18 Vent end time: 0397  Patient extubated per protocol. NIF -20 VC 0.9L. Patient had positive cuff leak prior to extubation. Patient placed on 4lpm humidified O2. Patient has strong cough and able to voice. Incentive spirometry instructed, 750 cc achieved. Evaluation  O2 sats: stable throughout Complications: No apparent complications Patient did tolerate procedure well. Bilateral Breath Sounds: Rhonchi   Yes  [name] 04/11/2018, 6:40 PM

## 2018-04-11 NOTE — Transfer of Care (Signed)
Immediate Anesthesia Transfer of Care Note  Patient: Dalton Townsend  Procedure(s) Performed: INTRAOPERATIVE TRANSTHORACIC ECHOCARDIOGRAM (Chest) LEFT TO RIGHT BYPASS GRAFT FEMORAL-FEMORAL ARTERY (N/A Groin) ENDARTERECTOMY FEMORAL (Right Groin) BALLOON AORTIC VALVE VALVULOPLASTY.  RIGHT EXTERNAL ILIAC ARTERY OVERSEW. (N/A Chest)  Patient Location: SICU  Anesthesia Type:MAC and General  Level of Consciousness: sedated and unresponsive  Airway & Oxygen Therapy: Patient remains intubated per anesthesia plan and Patient placed on Ventilator (see vital sign flow sheet for setting)  Post-op Assessment: Post -op Vital signs reviewed and stable  Post vital signs: Reviewed and stable  Last Vitals:  Vitals Value Taken Time  BP 156/69 04/11/2018  3:39 PM  Temp    Pulse 68 04/11/2018  3:50 PM  Resp 19 04/11/2018  3:50 PM  SpO2 100 % 04/11/2018  3:50 PM  Vitals shown include unvalidated device data.  Last Pain:  Vitals:   04/11/18 0817  TempSrc:   PainSc: 0-No pain      Patients Stated Pain Goal: 3 (82/[id number])  Complications: No apparent anesthesia complications

## 2018-04-11 NOTE — Brief Op Note (Signed)
04/11/2018  3:55 PM  PATIENT:  Dalton Townsend  84 y.o. male  PRE-OPERATIVE DIAGNOSIS:  Severe Aortic Stenosis  POST-OPERATIVE DIAGNOSIS:  same  PROCEDURE:  Procedure(s): INTRAOPERATIVE TRANSTHORACIC ECHOCARDIOGRAM LEFT TO RIGHT BYPASS GRAFT FEMORAL-FEMORAL ARTERY (N/A) ENDARTERECTOMY FEMORAL (Right) BALLOON AORTIC VALVE VALVULOPLASTY.  RIGHT EXTERNAL ILIAC ARTERY OVERSEW. (N/A)  SURGEON:  Surgeon(s) and Role:    * Rexene Alberts, MD - Primary - BAV and Oversewing of right external iliac artery    * Sherren Mocha, MD - Co-Surgeon - BAV    * Deitra Mayo, MD - Primary - Left to right femoral-femoral artery bypass  ANESTHESIA:   general  EBL:  2500 mL   BLOOD ADMINISTERED:3 CC PRBC  DRAINS: none   LOCAL MEDICATIONS USED:  LIDOCAINE   SPECIMEN:  No Specimen  DISPOSITION OF SPECIMEN:  N/A  COUNTS:  ACTION TAKEN: X-RAY(S) TAKEN IN OR  TOURNIQUET:  * No tourniquets in log *  DICTATION: .Note written in EPIC  PLAN OF CARE: Admit to inpatient   PATIENT DISPOSITION:  ICU - intubated and hemodynamically stable.   Delay start of Pharmacological VTE agent (>24hrs) due to surgical blood loss or risk of bleeding: yes  Rexene Alberts, MD 04/11/2018 3:59 PM

## 2018-04-11 NOTE — Anesthesia Preprocedure Evaluation (Signed)
Anesthesia Evaluation  Patient identified by MRN, date of birth, ID band Patient awake    Reviewed: Allergy & Precautions, NPO status , Patient's Chart, lab work & pertinent test results, reviewed documented beta blocker date and time   History of Anesthesia Complications Negative for: history of anesthetic complications  Airway Mallampati: II  TM Distance: >3 FB Neck ROM: Full    Dental  (+) Teeth Intact   Pulmonary former smoker,    breath sounds clear to auscultation       Cardiovascular hypertension, Pt. on medications and Pt. on home beta blockers + CAD, + Cardiac Stents, + CABG, + Peripheral Vascular Disease and +CHF  + dysrhythmias Atrial Fibrillation + pacemaker + Valvular Problems/Murmurs AS  Rhythm:Regular Rate:Bradycardia + Systolic murmurs    Neuro/Psych PSYCHIATRIC DISORDERS Anxiety    GI/Hepatic PUD,   Endo/Other  negative endocrine ROS  Renal/GU      Musculoskeletal   Abdominal   Peds  Hematology  (+) anemia ,   Anesthesia Other Findings 04/05/18:  Mid LM to Ost LAD lesion is 100% stenosed.  Ost Cx to Prox Cx lesion is 100% stenosed.  LIMA graft was visualized by angiography and is normal in caliber.  SVG graft was visualized by angiography and is normal in caliber.  SVG graft was visualized by angiography and is normal in caliber.  Prox RCA lesion is 50% stenosed.  Dist RCA lesion is 100% stenosed.  SVG graft was visualized by angiography and is normal in caliber.  Hemodynamic findings consistent with mild pulmonary hypertension.   1. Severe triple vessel CAD s/p 6VCABG with 6 patent grafts.  2. Chronic occlusion proximal LAD and proximal Circumflex, mid to distal RCA 3. Severe aortic stenosis by echo. Unable to advance catheters into the LV due to tortuosity in the left subclavian artery. Coronary angiography was complete and I did not think it would be worthwhile to gain femoral artery  access just to cross his valve. No LV pressures or aortic valve data obtained today by cath.   04/03/18: Left ventricle: Abnormal septal motion inferior basal hypokinesis   The cavity size was mildly dilated. Wall thickness was increased   in a pattern of moderate LVH. Systolic function was normal. The   estimated ejection fraction was in the range of 50% to 55%. Left   ventricular diastolic function parameters were normal. - Aortic valve: There was critical stenosis. There was mild   regurgitation. Valve area (VTI): 0.74 cm^2. Valve area (Vmax):   0.82 cm^2. Valve area (Vmean): 0.73 cm^2. - Mitral valve: Calcified annulus. Mildly thickened leaflets .   There was mild regurgitation. - Left atrium: The atrium was moderately dilated. - Right atrium: The atrium was mildly dilated. - Atrial septum: No defect or patent foramen ovale was identified.  Reproductive/Obstetrics                             Anesthesia Physical Anesthesia Plan  ASA: IV  Anesthesia Plan: MAC   Post-op Pain Management:    Induction: Intravenous  PONV Risk Score and Plan: 1 and Ondansetron and Treatment may vary due to age or medical condition  Airway Management Planned: Nasal Cannula  Additional Equipment: Arterial line, CVP and Ultrasound Guidance Line Placement  Intra-op Plan:   Post-operative Plan:   Informed Consent: I have reviewed the patients History and Physical, chart, labs and discussed the procedure including the risks, benefits and alternatives for the proposed anesthesia  with the patient or authorized representative who has indicated his/her understanding and acceptance.   Dental advisory given  Plan Discussed with: CRNA and Surgeon  Anesthesia Plan Comments:         Anesthesia Quick Evaluation

## 2018-04-11 NOTE — Interval H&P Note (Signed)
History and Physical Interval Note:  04/11/2018 9:52 AM  Dalton Townsend  has presented today for surgery, with the diagnosis of Severe Aortic Stenosis  The various methods of treatment have been discussed with the patient and family. After consideration of risks, benefits and other options for treatment, the patient has consented to  Procedure(s): TRANSCATHETER AORTIC VALVE REPLACEMENT, TRANSFEMORAL (N/A) TRANSESOPHAGEAL ECHOCARDIOGRAM (TEE) (N/A) as a surgical intervention .  The patient's history has been reviewed, patient examined, no change in status, stable for surgery.  I have reviewed the patient's chart and labs.  Questions were answered to the patient's satisfaction.    Pt set up for TAVR today by Dr Cyndia Bent and Angelena Form. Their notes are reviewed. Dr Roxy Manns and I evaluated the patient and discussed the procedure with the patient and his family members who are at the bedside. All of their questions are answered. No interval changes.   Sherren Mocha

## 2018-04-11 NOTE — Op Note (Signed)
NAME: Dalton Townsend    MRN: [id number] DOB: 09-26-1922    DATE OF OPERATION: 04/11/2018  PREOP DIAGNOSIS:    Aortic stenosis  POSTOP DIAGNOSIS:    Aortic stenosis Injury to right external iliac artery and right external iliac vein  PROCEDURE:    1.  Repair of right external iliac vein and oversewing of right external iliac artery (Dr. Roxy Manns) 2.  Endarterectomy of right common femoral artery and left to right femoral-femoral bypass with 8 mm Dacron graft (Dr. Scot Dock)  CARDIOTHORACIC SURGEON: Dr. Ether Griffins  VASCULAR SURGEON: Judeth Cornfield. Scot Dock, MD  ASSIST: Leontine Locket, PA  ANESTHESIA: General  EBL: Per anesthesia records  INDICATIONS:    Dalton Townsend is a 84 y.o. male who was undergoing TAVR in the [facility] became dislodged from the sheath and injured the right external iliac vein.  In removing the valve the artery and vein were injured.  I was consulted intraoperatively.  FINDINGS:   Severely calcified arteries bilaterally.  TECHNIQUE:   The patient was was undergoing TAVR.  There was some difficulty in passing the valve through the sheath which was of the right external iliac artery.  This ultimately went outside the sheath and injured the external iliac artery on the right.  In removing this the artery was transected in the iliac vein on the right was injured.   Dr. Roxy Manns was able to oversew the right external iliac artery and repair and injury to the right external iliac vein.  Distally control was obtained of the superficial femoral artery and deep femoral artery.  Once hemostasis was obtained we then turned attention to a left to right femorofemoral bypass.  He had a sheath in the left common femoral artery.  A longitudinal incision was made and the dissection carried down to the common femoral artery with a sheath entered.  This was at the level of the inguinal ligament.  I was able to get control above the entry site where there was an area of the  artery that I could clamp.  A 5-0 Prolene suture was placed at the site of cannulation and the sheath was removed from the artery.  However, the majority of the common femoral and external iliac artery was severely calcified and difficult to clamp.  Therefore had to dissect quite low in the superficial femoral artery to find a place to originate the left to right femorofemoral bypass.  Superficial femoral artery and deep femoral arteries were controlled.  A tunnel was created between the 2 incisions in an inverted U fashion.  Attention was first turned to the left femoral anastomosis.  The superficial femoral artery was clamped distally and the external iliac artery was clamped proximally.  The deep femoral artery was also controlled.  A longitudinal arteriotomy was made in the soft spot of the superficial femoral artery.  The 8 mm graft was spatulated and sewn into side to the superficial femoral artery using continuous 5-0 Prolene suture.  Prior to completing this anastomosis the artery was backbled and flushed appropriately and there was excellent inflow.  The graft had been clamped.  The graft then pulled the appropriate length for anastomosis to the right common femoral artery.  This had been transected as it was severely calcified and not consolable at this level.  The superficial femoral artery deep femoral artery had been clamped.  There was a large calcific plaque posteriorly endarterectomies this right down to the bifurcation.  Therefore I felt like it so right  at the bifurcation.  The arteriotomy was extended onto the superficial femoral artery.  The right limb of the graft was spatulated, cut the appropriate length and sewn end to end to the common femoral artery distally extending onto the superficial femoral artery was 5-0 Prolene suture.  Prior to completing this anastomosis the artery was backbled and flushed appropriate and the anastomosis was completed.  Completion with excellent flow in the  deep femoral artery and superficial femoral artery with good diastolic flow.  The right great peritoneal space was exposed again there appeared to be some bleeding from the external iliac artery stump which was oversewn with a 5-0 Prolene suture for good hemostasis.  The wound was irrigated and hemostasis obtained.  Where the anterior rectus sheath been divided this was repaired with interrupted sutures.  The deep layer was closed with interrupted 2-0 sutures and the superficial layer then closed with interrupted 3-0 sutures.  The skin was closed with staples.  A Praveena dressing was applied.  At this point, the patient was hemodynamically stable.   Deitra Mayo, MD, FACS [facility]  DATE OF DICTATION:   04/11/2018

## 2018-04-11 NOTE — Progress Notes (Signed)
      [address]       [place] [address]             [phone]      S/p attempted TAVR with iliac artery injury, L fem-fem bypass  Intubated, starting to wake up  BP (!) 105/47   Pulse (!) 59   Temp 98.5 F (36.9 C) (Oral)   Resp 15   Ht 5' 11"  (1.803 m)   Wt 83.5 kg   SpO2 98%   BMI 25.66 kg/m    Intake/Output Summary (Last 24 hours) at 04/11/2018 1826 Last data filed at 04/11/2018 1815 Gross per 24 hour  Intake 3721.4 ml  Output 2820 ml  Net 901.4 ml   Hct= 28  Wean vent as tolerated  Remo Lipps C. Roxan Hockey, MD [facility] [phone]

## 2018-04-11 NOTE — Op Note (Signed)
HEART AND VASCULAR CENTER   MULTIDISCIPLINARY HEART VALVE TEAM   TAVR OPERATIVE NOTE   Date of Procedure:  04/11/2018  Preoperative Diagnosis: Severe Aortic Stenosis   Postoperative Diagnosis: Same   Procedure:   Balloon aortic valvuloplasty   Co-Surgeons:  Braulio Conte H. Roxy Manns, MD and Sherren Mocha, MD  Anesthesiologist:  Laurie Panda, MD  Echocardiographer:  Ena Dawley, MD  Pre-operative Echo Findings:  Severe aortic stenosis  Normal left ventricular systolic function   BRIEF CLINICAL NOTE AND INDICATIONS FOR SURGERY  84 year old gentleman with clinical history of paroxysmal atrial fibrillation on apixaban, cerebrovascular disease status post CVA treated with right carotid endarterectomy, complete heart block treated with permanent pacemaker placement in 2006, and coronary artery disease status post remote CABG.  The patient has been followed for severe aortic stenosis and he became symptomatic over the last year.  He has remained remarkably active considering his advanced age.  He developed symptoms of acute shortness of breath at rest and was found to be in acute pulmonary edema requiring hospitalization from August 4 through August 9.  He underwent multidisciplinary heart team evaluation by Dr. Angelena Form and Dr. Cyndia Bent during his hospitalization.  He was found to have critical aortic stenosis by echo with a mean gradient of 53 mmHg and peak gradient 88 mmHg.  He underwent pre-TAVR studies with right and left heart catheterization demonstrating patency of all of his bypass grafts.  He also underwent CT angiography studies demonstrating suitable anatomy for transfemoral TAVR with fairly marked tortuosity of the iliac arteries but vessel size adequate for delivery of a transcatheter valve.  The patient was discharged home and returns today for TAVR for treatment of acute on chronic diastolic heart failure in the setting of critical aortic stenosis.  During the course of the  patient's preoperative work up they have been evaluated comprehensively by a multidisciplinary team of specialists coordinated through the [facility] in the [facility].  They have been demonstrated to suffer from symptomatic severe aortic stenosis as noted above. The patient has been counseled extensively as to the relative risks and benefits of all options for the treatment of severe aortic stenosis including long term medical therapy, conventional surgery for aortic valve replacement, and transcatheter aortic valve replacement. They are felt to be at high risk for conventional surgical aortic valve replacement based upon a predicted risk of mortality using the Society of Thoracic Surgeons risk calculator of 8.8%. Based upon review of all of the patient's preoperative diagnostic tests they are felt to be candidate for transcatheter aortic valve replacement using the transfemoral approach as an alternative to high risk conventional surgery.    Following the decision to proceed with transcatheter aortic valve replacement, a discussion has been held regarding what types of management strategies would be attempted intraoperatively in the event of life-threatening complications, including whether or not the patient would be considered a candidate for the use of cardiopulmonary bypass and/or conversion to open sternotomy for attempted surgical intervention.  The patient has been advised of a variety of complications that might develop peculiar to this approach including but not limited to risks of death, stroke, paravalvular leak, aortic dissection or other major vascular complications, aortic annulus rupture, device embolization, cardiac rupture or perforation, acute myocardial infarction, arrhythmia, heart block or bradycardia requiring permanent pacemaker placement, congestive heart failure, respiratory failure, renal failure, pneumonia, infection, other late  complications related to structural valve deterioration or migration, or other complications that might ultimately cause  a temporary or permanent loss of functional independence or other long term morbidity.  The patient provides full informed consent for the procedure as described and all questions were answered preoperatively.  DETAILS OF THE OPERATIVE PROCEDURE  PREPARATION:   The patient is brought to the operating room on the above mentioned date and central monitoring was established by the anesthesia team including placement of a central venous catheter and radial arterial line. The patient is placed in the supine position on the operating table.  Intravenous antibiotics are administered. The patient is monitored closely throughout the procedure under conscious sedation.  Baseline transthoracic echocardiogram is performed. The patient's chest, abdomen, both groins, and both lower extremities are prepared and draped in a sterile manner. A time out procedure is performed.  PERIPHERAL ACCESS:   Using ultrasound guidance, femoral arterial and venous access is obtained with placement of 6 Fr sheaths on the left side.  [place] images are captured and stored in the patient's chart. The left iliac artery is very tortuous and required a directional catheter and Wholey wire to navigate into the aorta.  A pigtail diagnostic catheter was passed through the femoral arterial sheath under fluoroscopic guidance into the aortic root.  A temporary transvenous pacemaker catheter was passed through the femoral venous sheath under fluoroscopic guidance into the right ventricle.  The pacemaker was tested to ensure stable lead placement and pacemaker capture. Aortic root angiography was performed in order to determine the optimal angiographic angle for valve deployment.   TRANSFEMORAL ACCESS:  A micropuncture technique is used to access the right femoral artery under fluoroscopic and ultrasound guidance.  2 Perclose devices  are deployed at 10' and 2' positions to 'PreClose' the femoral artery. An 8 French sheath is placed and then an Amplatz Superstiff wire is advanced through the sheath.  A JR4 catheter was used to direct a Wholey wire into the aorta in order to help navigate tortuous iliac anatomy.  The catheter was used to change out to a Super Stiff wire.  This is changed out for a 14 French transfemoral E-Sheath after progressively dilating over the Superstiff wire.  An AL-2 catheter was used to direct a straight-tip exchange length wire across the native aortic valve into the left ventricle. This was exchanged out for a pigtail catheter and position was confirmed in the LV apex. Simultaneous LV and Ao pressures were recorded.  The pigtail catheter was exchanged for an Amplatz Extra-stiff wire in the LV apex.    BALLOON AORTIC VALVULOPLASTY:  Balloon aortic valvuloplasty was performed using a 23 mm valvuloplasty balloon.  The balloon was very difficult to advance across the aortic valve.  Once optimal position was achieved, BAV was done under rapid ventricular pacing. The patient recovered well hemodynamically.   TRANSCATHETER HEART VALVE DEPLOYMENT:  An Edwards Sapien 3 transcatheter heart valve (size 26 mm, model #9600TFX, serial #[id number].) was prepared and crimped per manufacturer's guidelines, and the proper orientation of the valve is confirmed on the [employer] delivery system. The valve was advanced through the introducer sheath using normal technique but there was significant resistance in advancing the valve. Fluoroscopic assessment demonstrated a kink in the [place] delivery system within the distal iliac artery. Attempts were made to withdraw the sheath while advancing the valve, but there again is significant resistance. Further fluoroscopic imaging demonstrates the relationship of the valve and sheath where the valve appears external to the E-sheath. After discussion with Dr Roxy Manns, we agreed that a  surgical femoral cutdown would  be required. General anesthesia is induced and the patient is intubated. The left femoral artery sheath is then changed out over an Amplatz Extra stiff wire for a Morse aortic balloon occlusion catheter and the balloon is dilated under fluoroscopy until it appears fully inflated and resistance is felt. After exposure of the iliac artery, the Commander catheter and TAVR valve are removed from the body.  Dr Scot Dock with vascular surgery is called and scrubs in. Dr Scot Dock and Dr Roxy Manns perform the remainder of the surgery. Please see separate operative notes for details.   PROCEDURE COMPLETION:  Please see separate note of Dr Roxy Manns.   Contrast administered: 25 cc  Sherren Mocha, MD 04/11/2018 1:52 PM

## 2018-04-11 NOTE — Anesthesia Procedure Notes (Signed)
Procedure Name: Intubation Date/Time: 04/11/2018 12:05 PM Performed by: Inda Coke, CRNA Pre-anesthesia Checklist: Patient identified, Emergency Drugs available, Suction available and Patient being monitored Patient Re-evaluated:Patient Re-evaluated prior to induction Oxygen Delivery Method: Circle System Utilized Preoxygenation: Pre-oxygenation with 100% oxygen Induction Type: IV induction Ventilation: Mask ventilation without difficulty Laryngoscope Size: Miller and 3 Grade View: Grade I Tube type: Oral Tube size: 7.5 mm Number of attempts: 1 Airway Equipment and Method: Stylet and Oral airway Placement Confirmation: ETT inserted through vocal cords under direct vision,  positive ETCO2 and breath sounds checked- equal and bilateral Secured at: 23 cm Tube secured with: Tape Dental Injury: Teeth and Oropharynx as per pre-operative assessment  Comments: DL by Darrell Jewel, SRNA

## 2018-04-11 NOTE — Progress Notes (Signed)
Patient interviewed in preop area. Patient able to confirm name, DOB, procedure, no allergies, metal in left knee. Patient reports 0/10 pain. Family at bedside.    Lattie Corns, RN

## 2018-04-12 ENCOUNTER — Inpatient Hospital Stay (HOSPITAL_COMMUNITY): Payer: Medicare Other

## 2018-04-12 ENCOUNTER — Encounter (HOSPITAL_COMMUNITY): Payer: Self-pay | Admitting: Cardiovascular Disease

## 2018-04-12 ENCOUNTER — Other Ambulatory Visit (HOSPITAL_COMMUNITY): Payer: Medicare Other

## 2018-04-12 LAB — BPAM FFP
BLOOD PRODUCT EXPIRATION DATE: 201908182359
BLOOD PRODUCT EXPIRATION DATE: 201908182359
Blood Product Expiration Date: 201908182359
Blood Product Expiration Date: 201908182359
Blood Product Expiration Date: 201908182359
ISSUE DATE / TIME: 201908131237
ISSUE DATE / TIME: 201908131237
ISSUE DATE / TIME: 201908131246
ISSUE DATE / TIME: 201908131246
UNIT TYPE AND RH: 600
UNIT TYPE AND RH: 600
UNIT TYPE AND RH: 6200
Unit Type and Rh: 6200
Unit Type and Rh: 6200

## 2018-04-12 LAB — PREPARE FRESH FROZEN PLASMA
UNIT DIVISION: 0
Unit division: 0

## 2018-04-12 LAB — CBC
HEMATOCRIT: 26.8 % — AB (ref 39.0–52.0)
HEMATOCRIT: 26 % — AB (ref 39.0–52.0)
HEMOGLOBIN: 8.8 g/dL — AB (ref 13.0–17.0)
HEMOGLOBIN: 8.4 g/dL — AB (ref 13.0–17.0)
MCH: 30.2 pg (ref 26.0–34.0)
MCH: 30.2 pg (ref 26.0–34.0)
MCHC: 32.8 g/dL (ref 30.0–36.0)
MCHC: 32.3 g/dL (ref 30.0–36.0)
MCV: 92.1 fL (ref 78.0–100.0)
MCV: 93.5 fL (ref 78.0–100.0)
Platelets: 137 10*3/uL — ABNORMAL LOW (ref 150–400)
Platelets: 151 10*3/uL (ref 150–400)
RBC: 2.91 MIL/uL — ABNORMAL LOW (ref 4.22–5.81)
RBC: 2.78 MIL/uL — AB (ref 4.22–5.81)
RDW: 15.9 % — ABNORMAL HIGH (ref 11.5–15.5)
RDW: 15.9 % — ABNORMAL HIGH (ref 11.5–15.5)
WBC: 9.8 10*3/uL (ref 4.0–10.5)
WBC: 8.7 10*3/uL (ref 4.0–10.5)

## 2018-04-12 LAB — POCT I-STAT, CHEM 8
BUN: 32 mg/dL — ABNORMAL HIGH (ref 8–23)
CALCIUM ION: 1.2 mmol/L (ref 1.15–1.40)
CHLORIDE: 103 mmol/L (ref 98–111)
CREATININE: 1.2 mg/dL (ref 0.61–1.24)
GLUCOSE: 171 mg/dL — AB (ref 70–99)
HCT: 23 % — ABNORMAL LOW (ref 39.0–52.0)
Hemoglobin: 7.8 g/dL — ABNORMAL LOW (ref 13.0–17.0)
Potassium: 3.7 mmol/L (ref 3.5–5.1)
Sodium: 139 mmol/L (ref 135–145)
TCO2: 24 mmol/L (ref 22–32)

## 2018-04-12 LAB — BASIC METABOLIC PANEL
ANION GAP: 8 (ref 5–15)
BUN: 31 mg/dL — ABNORMAL HIGH (ref 8–23)
CHLORIDE: 109 mmol/L (ref 98–111)
CO2: 21 mmol/L — AB (ref 22–32)
Calcium: 7.9 mg/dL — ABNORMAL LOW (ref 8.9–10.3)
Creatinine, Ser: 1.23 mg/dL (ref 0.61–1.24)
GFR calc non Af Amer: 49 mL/min — ABNORMAL LOW (ref >60)
GFR, EST AFRICAN AMERICAN: 57 mL/min — AB (ref >60)
Glucose, Bld: 160 mg/dL — ABNORMAL HIGH (ref 70–99)
POTASSIUM: 4.4 mmol/L (ref 3.5–5.1)
Sodium: 138 mmol/L (ref 135–145)

## 2018-04-12 LAB — GLUCOSE, CAPILLARY
GLUCOSE-CAPILLARY: 143 mg/dL — AB (ref 70–99)
Glucose-Capillary: 169 mg/dL — ABNORMAL HIGH (ref 70–99)

## 2018-04-12 LAB — CREATININE, SERUM
CREATININE: 1.26 mg/dL — AB (ref 0.61–1.24)
GFR calc Af Amer: 55 mL/min — ABNORMAL LOW (ref >60)
GFR calc non Af Amer: 48 mL/min — ABNORMAL LOW (ref >60)

## 2018-04-12 LAB — MAGNESIUM
Magnesium: 2 mg/dL (ref 1.7–2.4)
Magnesium: 2.2 mg/dL (ref 1.7–2.4)

## 2018-04-12 LAB — CK: CK TOTAL: 454 U/L — AB (ref 49–397)

## 2018-04-12 MED ORDER — INSULIN ASPART 100 UNIT/ML ~~LOC~~ SOLN
0.0000 [IU] | SUBCUTANEOUS | Status: DC
  Administered 2018-04-12: 2 [IU] via SUBCUTANEOUS
  Administered 2018-04-12: 4 [IU] via SUBCUTANEOUS

## 2018-04-12 MED ORDER — MUPIROCIN 2 % EX OINT
TOPICAL_OINTMENT | Freq: Two times a day (BID) | CUTANEOUS | Status: DC
  Administered 2018-04-12 – 2018-04-17 (×11): via NASAL
  Filled 2018-04-12 (×2): qty 22

## 2018-04-12 MED ORDER — ATORVASTATIN CALCIUM 20 MG PO TABS
20.0000 mg | ORAL_TABLET | ORAL | Status: DC
  Administered 2018-04-13 – 2018-04-21 (×5): 20 mg via ORAL
  Filled 2018-04-12 (×7): qty 1

## 2018-04-12 MED ORDER — AMLODIPINE BESYLATE 5 MG PO TABS
5.0000 mg | ORAL_TABLET | Freq: Every day | ORAL | Status: DC
  Administered 2018-04-12 – 2018-04-17 (×6): 5 mg via ORAL
  Filled 2018-04-12 (×6): qty 1

## 2018-04-12 MED ORDER — POTASSIUM CHLORIDE CRYS ER 10 MEQ PO TBCR
30.0000 meq | EXTENDED_RELEASE_TABLET | Freq: Once | ORAL | Status: AC
  Administered 2018-04-12: 30 meq via ORAL
  Filled 2018-04-12: qty 1

## 2018-04-12 MED ORDER — ESCITALOPRAM OXALATE 10 MG PO TABS
10.0000 mg | ORAL_TABLET | Freq: Every day | ORAL | Status: DC
  Administered 2018-04-12 – 2018-04-22 (×10): 10 mg via ORAL
  Filled 2018-04-12 (×10): qty 1

## 2018-04-12 MED ORDER — FINASTERIDE 5 MG PO TABS
5.0000 mg | ORAL_TABLET | ORAL | Status: DC
  Administered 2018-04-13 – 2018-04-17 (×5): 5 mg via ORAL
  Filled 2018-04-12 (×4): qty 1

## 2018-04-12 MED ORDER — BALSALAZIDE DISODIUM 750 MG PO CAPS
2250.0000 mg | ORAL_CAPSULE | Freq: Three times a day (TID) | ORAL | Status: DC
  Administered 2018-04-12 – 2018-04-17 (×16): 2250 mg via ORAL
  Filled 2018-04-12 (×21): qty 3

## 2018-04-12 MED ORDER — METOPROLOL TARTRATE 12.5 MG HALF TABLET
12.5000 mg | ORAL_TABLET | Freq: Two times a day (BID) | ORAL | Status: DC
  Administered 2018-04-12 – 2018-04-15 (×6): 12.5 mg via ORAL
  Filled 2018-04-12 (×6): qty 1

## 2018-04-12 NOTE — Progress Notes (Signed)
   VASCULAR SURGERY ASSESSMENT & PLAN:   1 Day Post-Op s/p: left to right fem-fem bypass. Good doppler flow both feet.   Preveena's in place with good seal.   I will be going out of town. My partners will be available if there are any issues.   SUBJECTIVE:   Comfortable this AM.   PHYSICAL EXAM:   Vitals:   04/12/18 0400 04/12/18 0421 04/12/18 0500 04/12/18 0600  BP: (!) 158/67  (!) 124/58 (!) 147/56  Pulse: 69  67 67  Resp: 18  (!) 21 15  Temp:      TempSrc:      SpO2: 99%  98% 99%  Weight:  84.9 kg    Height:       Preveena's with good seal on both groin incisions Right lateral tarsal and PT signal with doppler (biphasic PT) Left ATA and PT signal with doppler  LABS:   Lab Results  Component Value Date   WBC 9.8 04/12/2018   HGB 8.8 (L) 04/12/2018   HCT 26.8 (L) 04/12/2018   MCV 92.1 04/12/2018   PLT 137 (L) 04/12/2018   Lab Results  Component Value Date   CREATININE 1.23 04/12/2018   Lab Results  Component Value Date   INR 1.38 04/11/2018   PROBLEM LIST:    Principal Problem:   Severe aortic stenosis Active Problems:   HTN (hypertension)   Ulcerative colitis (HCC)   Acute on chronic congestive heart failure (HCC)   Presence of permanent cardiac pacemaker   PAF (paroxysmal atrial fibrillation) (HCC)   Carotid artery disease (HCC)   CAD (coronary artery disease)   BPH (benign prostatic hyperplasia)   GAD (generalized anxiety disorder)   Aortic stenosis  CURRENT MEDS:   . acetaminophen  1,000 mg Oral Q6H   Or  . acetaminophen (TYLENOL) oral liquid 160 mg/5 mL  1,000 mg Per Tube Q6H  . bisacodyl  10 mg Oral Daily   Or  . bisacodyl  10 mg Rectal Daily  . Chlorhexidine Gluconate Cloth  6 each Topical Daily  . docusate sodium  200 mg Oral Daily  . latanoprost  1 drop Both Eyes QHS  . mouth rinse  15 mL Mouth Rinse BID  . [START ON 04/13/2018] pantoprazole  40 mg Oral Daily  . sodium chloride flush  3 mL Intravenous Q12H   Deitra Mayo Beeper: [phone] Office: [phone] 04/12/2018

## 2018-04-12 NOTE — Anesthesia Postprocedure Evaluation (Signed)
Anesthesia Post Note  Patient: Dalton Townsend  Procedure(s) Performed: INTRAOPERATIVE TRANSTHORACIC ECHOCARDIOGRAM (Chest) LEFT TO RIGHT BYPASS GRAFT FEMORAL-FEMORAL ARTERY (N/A Groin) ENDARTERECTOMY FEMORAL (Right Groin) BALLOON AORTIC VALVE VALVULOPLASTY.  RIGHT EXTERNAL ILIAC ARTERY OVERSEW. (N/A Chest)     Patient location during evaluation: SICU Anesthesia Type: General Level of consciousness: sedated Pain management: pain level controlled Vital Signs Assessment: post-procedure vital signs reviewed and stable Respiratory status: patient remains intubated per anesthesia plan Cardiovascular status: stable Postop Assessment: no apparent nausea or vomiting Anesthetic complications: no    Last Vitals:  Vitals:   04/12/18 1100 04/12/18 1200  BP: 133/62 135/66  Pulse: 67 66  Resp: 15 18  Temp:    SpO2: 100% 99%    Last Pain:  Vitals:   04/11/18 2000  TempSrc:   PainSc: 0-No pain                 Minervia Osso

## 2018-04-12 NOTE — Progress Notes (Signed)
CT surgery  VS Stable Both feet warm

## 2018-04-12 NOTE — Progress Notes (Signed)
Patient just up to chair wanted to stay in chair. Will do first thing in AM.

## 2018-04-12 NOTE — Progress Notes (Addendum)
      [address]       [place] [address]             [phone]        CARDIOTHORACIC SURGERY PROGRESS NOTE   R1 Day Post-Op Procedure(s) (LRB): INTRAOPERATIVE TRANSTHORACIC ECHOCARDIOGRAM LEFT TO RIGHT BYPASS GRAFT FEMORAL-FEMORAL ARTERY (N/A) ENDARTERECTOMY FEMORAL (Right) BALLOON AORTIC VALVE VALVULOPLASTY.  RIGHT EXTERNAL ILIAC ARTERY OVERSEW. (N/A)  Subjective: Looks good and feels well.  Mild soreness right groin  Objective: Vital signs: BP Readings from Last 1 Encounters:  04/12/18 (!) 165/74   Pulse Readings from Last 1 Encounters:  04/12/18 71   Resp Readings from Last 1 Encounters:  04/12/18 (!) 21   Temp Readings from Last 1 Encounters:  04/11/18 100.3 F (37.9 C) (Axillary)    Hemodynamics:    Physical Exam:  Rhythm:   Afib w/ controlled rate  Breath sounds: clear  Heart sounds:  Irregular w/ harsh systolic murmur  Incisions:  Dressings intact  Abdomen:  Soft, non-distended, non-tender  Extremities:  Warm, well-perfused, pulses intact   Intake/Output from previous day: 08/13 0701 - 08/14 0700 In: 4834.8 [I.V.:2249.1; Blood:1589; NG/GT:30; IV Piggyback:966.7] Out: 6468 [Urine:830; Blood:2500] Intake/Output this shift: Total I/O In: -  Out: 75 [Urine:75]  Lab Results:  CBC: Recent Labs    04/11/18 1613  04/11/18 2005 04/12/18 0411  WBC 19.6*  --   --  9.8  HGB 11.6*   < > 9.2* 8.8*  HCT 34.9*   < > 27.0* 26.8*  PLT 182  --   --  137*   < > = values in this interval not displayed.    BMET:  Recent Labs    04/11/18 2005 04/12/18 0411  NA 139 138  K 4.7 4.4  CL 106 109  CO2  --  21*  GLUCOSE 141* 160*  BUN 30* 31*  CREATININE 1.10 1.23  CALCIUM  --  7.9*     PT/INR:   Recent Labs    04/11/18 1613  LABPROT 16.8*  INR 1.38    CBG (last 3)  No results for input(s): GLUCAP in the last 72 hours.  ABG    Component Value Date/Time   PHART 7.313 (L) 04/11/2018 2001   PCO2ART 38.9 04/11/2018 2001   PO2ART 86.0 04/11/2018 2001   HCO3 19.7 (L) 04/11/2018 2001   TCO2 20 (L) 04/11/2018 2005   ACIDBASEDEF 6.0 (H) 04/11/2018 2001   O2SAT 95.0 04/11/2018 2001    CXR: Looks good  Assessment/Plan: S/P Procedure(s) (LRB): INTRAOPERATIVE TRANSTHORACIC ECHOCARDIOGRAM LEFT TO RIGHT BYPASS GRAFT FEMORAL-FEMORAL ARTERY (N/A) ENDARTERECTOMY FEMORAL (Right) BALLOON AORTIC VALVE VALVULOPLASTY.  RIGHT EXTERNAL ILIAC ARTERY OVERSEW. (N/A)  Clinically stable Maintaining rate-controlled Afib w/ stable BP, BP trending up Vascular exam w/ intact pulses, both groins okay w/ no signs of bleeding Expected post op acute blood loss anemia, Hgb 8.8 this morning Chronic diastolic CHF with expected post-op volume excess, weight not recorded yet Severe aortic stenosis s/p BAV    D/C Aline  Mobilize slowly if okay w/ Dr Georgeanna Harrison al  Keep in ICU today  Check f/u ECHO   Rexene Alberts, MD 04/12/2018 8:56 AM

## 2018-04-13 ENCOUNTER — Inpatient Hospital Stay (HOSPITAL_COMMUNITY): Payer: Medicare Other

## 2018-04-13 ENCOUNTER — Telehealth: Payer: Self-pay | Admitting: Vascular Surgery

## 2018-04-13 DIAGNOSIS — I361 Nonrheumatic tricuspid (valve) insufficiency: Secondary | ICD-10-CM

## 2018-04-13 DIAGNOSIS — I350 Nonrheumatic aortic (valve) stenosis: Secondary | ICD-10-CM

## 2018-04-13 LAB — ECHOCARDIOGRAM COMPLETE
HEIGHTINCHES: 71 in
WEIGHTICAEL: 3040.58 [oz_av]

## 2018-04-13 LAB — CBC
HEMATOCRIT: 24.7 % — AB (ref 39.0–52.0)
Hemoglobin: 8 g/dL — ABNORMAL LOW (ref 13.0–17.0)
MCH: 30.4 pg (ref 26.0–34.0)
MCHC: 32.4 g/dL (ref 30.0–36.0)
MCV: 93.9 fL (ref 78.0–100.0)
PLATELETS: 134 10*3/uL — AB (ref 150–400)
RBC: 2.63 MIL/uL — ABNORMAL LOW (ref 4.22–5.81)
RDW: 15.6 % — AB (ref 11.5–15.5)
WBC: 8.9 10*3/uL (ref 4.0–10.5)

## 2018-04-13 LAB — BASIC METABOLIC PANEL
Anion gap: 6 (ref 5–15)
BUN: 27 mg/dL — AB (ref 8–23)
CALCIUM: 8.3 mg/dL — AB (ref 8.9–10.3)
CO2: 24 mmol/L (ref 22–32)
CREATININE: 1.13 mg/dL (ref 0.61–1.24)
Chloride: 110 mmol/L (ref 98–111)
GFR calc Af Amer: >60 mL/min (ref >60)
GFR, EST NON AFRICAN AMERICAN: 54 mL/min — AB (ref >60)
Glucose, Bld: 108 mg/dL — ABNORMAL HIGH (ref 70–99)
POTASSIUM: 4.1 mmol/L (ref 3.5–5.1)
SODIUM: 140 mmol/L (ref 135–145)

## 2018-04-13 LAB — GLUCOSE, CAPILLARY
GLUCOSE-CAPILLARY: 112 mg/dL — AB (ref 70–99)
GLUCOSE-CAPILLARY: 91 mg/dL (ref 70–99)
Glucose-Capillary: 112 mg/dL — ABNORMAL HIGH (ref 70–99)

## 2018-04-13 MED ORDER — FUROSEMIDE 40 MG PO TABS
40.0000 mg | ORAL_TABLET | Freq: Two times a day (BID) | ORAL | Status: DC
  Administered 2018-04-13 – 2018-04-17 (×9): 40 mg via ORAL
  Filled 2018-04-13 (×9): qty 1

## 2018-04-13 MED ORDER — SODIUM CHLORIDE 0.9 % IV SOLN: INTRAVENOUS | Status: DC | PRN

## 2018-04-13 MED ORDER — POTASSIUM CHLORIDE CRYS ER 10 MEQ PO TBCR
10.0000 meq | EXTENDED_RELEASE_TABLET | Freq: Two times a day (BID) | ORAL | Status: DC
  Administered 2018-04-13 – 2018-04-17 (×9): 10 meq via ORAL
  Filled 2018-04-13 (×9): qty 1

## 2018-04-13 MED ORDER — FUROSEMIDE 10 MG/ML IJ SOLN: 40.0000 mg | Freq: Once | INTRAMUSCULAR | Status: DC

## 2018-04-13 NOTE — Telephone Encounter (Signed)
Sched. Appt, LVM 05/03/18 2/3wks s/p fem-fem bypass 11am p/o MD

## 2018-04-13 NOTE — Progress Notes (Addendum)
[place] VALVE TEAM  Patient Name: Dalton Townsend Date of Encounter: 04/13/2018  Primary Cardiologist: Dr. Marlou Porch / Dr. Burt Knack & Dr. Roxy Manns (TAVR)  Hospital Problem List     Principal Problem:   Severe aortic stenosis Active Problems:   HTN (hypertension)   Ulcerative colitis ([place])   Acute on chronic congestive heart failure (HCC)   Presence of permanent cardiac pacemaker   PAF (paroxysmal atrial fibrillation) (HCC)   Carotid artery disease (HCC)   CAD (coronary artery disease)   BPH (benign prostatic hyperplasia)   GAD (generalized anxiety disorder)   Aortic stenosis     Subjective   Doing well . No complaints. No CP or SOB. He got up and sat in the chair yesterday with no issues.   Inpatient Medications    Scheduled Meds: . acetaminophen  1,000 mg Oral Q6H  . amLODipine  5 mg Oral Daily  . atorvastatin  20 mg Oral QODAY  . balsalazide  2,250 mg Oral TID WC  . bisacodyl  10 mg Oral Daily   Or  . bisacodyl  10 mg Rectal Daily  . Chlorhexidine Gluconate Cloth  6 each Topical Daily  . docusate sodium  200 mg Oral Daily  . escitalopram  10 mg Oral Daily  . finasteride  5 mg Oral Once per day on Sun Mon Tue Thu Fri Sat  . furosemide  40 mg Oral BID  . latanoprost  1 drop Both Eyes QHS  . mouth rinse  15 mL Mouth Rinse BID  . metoprolol tartrate  12.5 mg Oral BID  . mupirocin ointment   Nasal BID  . pantoprazole  40 mg Oral Daily  . potassium chloride  10 mEq Oral BID WC  . sodium chloride flush  3 mL Intravenous Q12H   Continuous Infusions: . sodium chloride     PRN Meds: sodium chloride, fentaNYL (SUBLIMAZE) injection, metoprolol tartrate, morphine injection, ondansetron (ZOFRAN) IV, oxyCODONE, sodium chloride flush, traMADol   Vital Signs    Vitals:   04/13/18 0412 04/13/18 0500 04/13/18 0600 04/13/18 0735  BP:  (!) 152/65 (!) 151/64 121/60  Pulse:  74 91 84  Resp:  17 16 16   Temp: 98.6 F (37 C)   98 F  (36.7 C)  TempSrc: Oral   Oral  SpO2:  95% 93% 95%  Weight:   86.2 kg   Height:        Intake/Output Summary (Last 24 hours) at 04/13/2018 0759 Last data filed at 04/13/2018 0603 Gross per 24 hour  Intake 380 ml  Output 1068 ml  Net -688 ml   Filed Weights   04/11/18 0737 04/12/18 0421 04/13/18 0600  Weight: 83.5 kg 84.9 kg 86.2 kg    Physical Exam   GEN: Well nourished, well developed, in no acute distress.  HEENT: Grossly normal.  Neck: Supple, no JVD or masses. Cardiac: RRR, + harsh systolic murmur. No rubs, or gallops. No clubbing, cyanosis, edema.  Radials/DP/PT 2+ and equal bilaterally.  Respiratory:  Respirations regular and unlabored, clear to auscultation bilaterally. GI: Soft, nontender, nondistended, BS + x 4. MS: no deformity or atrophy. Skin: warm and dry, no rash. Dressings intact Neuro:  Strength and sensation are intact. Psych: AAOx3.  Normal affect.  Labs    CBC Recent Labs    04/12/18 1547 04/12/18 1558 04/13/18 0435  WBC 8.7  --  8.9  HGB 8.4* 7.8* 8.0*  HCT 26.0* 23.0* 24.7*  MCV 93.5  --  93.9  PLT 151  --  419*   Basic Metabolic Panel Recent Labs    04/12/18 0411 04/12/18 1547 04/12/18 1558 04/13/18 0435  NA 138  --  139 140  K 4.4  --  3.7 4.1  CL 109  --  103 110  CO2 21*  --   --  24  GLUCOSE 160*  --  171* 108*  BUN 31*  --  32* 27*  CREATININE 1.23 1.26* 1.20 1.13  CALCIUM 7.9*  --   --  8.3*  MG 2.0 2.2  --   --    Liver Function Tests No results for input(s): AST, ALT, ALKPHOS, BILITOT, PROT, ALBUMIN in the last 72 hours. No results for input(s): LIPASE, AMYLASE in the last 72 hours. Cardiac Enzymes Recent Labs    04/12/18 0411  CKTOTAL 454*   BNP Invalid input(s): POCBNP D-Dimer No results for input(s): DDIMER in the last 72 hours. Hemoglobin A1C No results for input(s): HGBA1C in the last 72 hours. Fasting Lipid Panel No results for input(s): CHOL, HDL, LDLCALC, TRIG, CHOLHDL, LDLDIRECT in the last 72  hours. Thyroid Function Tests No results for input(s): TSH, T4TOTAL, T3FREE, THYROIDAB in the last 72 hours.  Invalid input(s): FREET3  Telemetry    paced - Personally Reviewed  ECG    AV paced - Personally Reviewed  Radiology    Dg Chest Port 1 View  Result Date: 04/12/2018 CLINICAL DATA:  Sore chest EXAM: PORTABLE CHEST 1 VIEW COMPARISON:  04/11/2018 FINDINGS: Prior CABG. Left pacer remains in place, unchanged. Interval removal of endotracheal tube and NG tube. Right central line is stable. Heart is upper limits normal in size with bibasilar atelectasis and mild vascular congestion. No effusions or pneumothorax. IMPRESSION: Mild vascular congestion.  Bibasilar atelectasis. Electronically Signed   By: Rolm Baptise M.D.   On: 04/12/2018 09:35   Dg Chest Port 1 View  Result Date: 04/11/2018 CLINICAL DATA:  Status post aortic valvuloplasty. EXAM: PORTABLE CHEST 1 VIEW COMPARISON:  CT chest dated April 04, 2018. Chest x-ray dated April 02, 2018. FINDINGS: Endotracheal tube in place with the tip 2.9 cm above the carina. Enteric tube in the stomach. Right internal jugular central venous catheter with the tip in the mid SVC. Unchanged left chest wall pacemaker. Stable cardiomegaly status post CABG. Normal pulmonary vascularity. Improved aeration at the lung bases. Unchanged scarring in the right mid lung. No focal consolidation, pleural effusion, or pneumothorax. No acute osseous abnormality. IMPRESSION: 1. Appropriately positioned support apparatus. 2. Improved aeration at the lung bases. Electronically Signed   By: Titus Dubin M.D.   On: 04/11/2018 16:06   Dg Abd Portable 1v  Result Date: 04/11/2018 CLINICAL DATA:  84 year old male status post enteric tube placement. EXAM: PORTABLE ABDOMEN - 1 VIEW COMPARISON:  CTA abdomen and Pelvis 04/04/2018. FINDINGS: Portable AP supine view at 1550 hours. Enteric tube placed with side hole at the level of the gastric body in the left upper quadrant.  Negative visible bowel gas pattern. Calcified aortic atherosclerosis. Lung base ventilation appears improved from the prior CT. Sequelae of CABG and cardiac pacemaker leads redemonstrated. No acute osseous abnormality identified. IMPRESSION: Enteric tube side hole at the level of the gastric body. Electronically Signed   By: Genevie Ann M.D.   On: 04/11/2018 16:07   Dg Or Local Abdomen  Result Date: 04/11/2018 CLINICAL DATA:  TAVR. Right external iliac artery and vein injury requiring open repair. EXAM: OR LOCAL ABDOMEN COMPARISON:  CT abdomen  pelvis dated April 04, 2018. FINDINGS: No radiopaque foreign body. Surgical clips noted overlying the right acetabulum and left ischium. Skin staples in both groins. Small amount of contrast within the bladder. Visualized bowel gas pattern is nonobstructive. No acute osseous abnormality. IMPRESSION: No radiopaque foreign body identified. These results were called by telephone at the time of interpretation on 04/11/2018 at 3:52 pm to [facility], RN, who verbally acknowledged these results. Electronically Signed   By: Titus Dubin M.D.   On: 04/11/2018 15:54    Cardiac Studies   TAVR OPERATIVE NOTE   Date of Procedure:                04/11/2018  Preoperative Diagnosis:      Severe Aortic Stenosis  Procedure:      ? Balloon aortic valvuloplasty              Co-Surgeons:                        Braulio Conte H. Roxy Manns, MD and Sherren Mocha, MD  Pre-operative Echo Findings: ? Severe aortic stenosis ? Normal left ventricular systolic function  _______________  2D ECHO 04/13/18: pending   Patient Profile      Mr. Fishbaugh is a 84 year old male with a history of CAD s/p CABG (1999), PAD s/p R CEA (2001) w/ 80-99% RICA occlusion now, hx of CVA, s/p PPM, HTN, PAF on Eliquis, ulcerative colitis and severe AS who presented to [facility] on 04/11/18 for planned TAVR.   Assessment & Plan   Severe AS: during TAVR procedure, there was a vascular site complication that  required right external iliac artery oversewing and emergent fem-fem bypass by Dr. Scot Dock. TAVR valve was not able to be deployed but he did get a BAV. Echocardiogram to be done today. The valve team will discuss the case and decide on plans for future TAVR if he continues to progress from vascular surgery.   Fem-fem bypass: completed by Dr. Scot Dock. Preveena dressings in place with good seal. Doppler with good flow in both feet per vascular   Acute on chronic diastolic CHF: CXR yesterday showed mild vascular congestion. Weights up 6 lbs since admission. I will give one dose of IV lasix 40 mg now. Fortunately he has normal renal function. Foley cath to be removed. Condom cath worked well last admission   Carotid artery disease: pre TAVR dopplers showed 80-99% RICA stenosis where he previously had R CEA. Evaluated by Dr. Bridgett Larsson prior to admission. Plan was for outpatient CT and follow up.   HTN: BP well controlled currently. Home amlodipine resumed and on Lopressor 12.50m BID.   PAF: tele shows paced rhythm. Eliquis on hold given recent vascular surgery.  CAD s/p CABG x6V: pre TAVR cath showed patent 6/6 bypass grafts.   Post operative anemia: Hg 8 today. Continue to monitor  Signed, KAngelena Form PA-C  04/13/2018, 7:59 AM  Pager [phone]  I have seen and examined the patient and agree with the assessment and plan as outlined.  Mobilize.  Diuresis.  Transfer step-down.  Check f/u ECHO  CRexene Alberts MD 04/13/2018 8:34 AM

## 2018-04-13 NOTE — Progress Notes (Signed)
Echocardiogram 2D Echocardiogram has been performed.  Dalton Townsend 04/13/2018, 9:59 AM

## 2018-04-13 NOTE — Progress Notes (Signed)
  Progress Note    04/13/2018 7:40 AM 2 Days Post-Op  Subjective:  No complaints  Afebrile HR 70's-100's  924'M-628'M systolic 38% RA  Vitals:   04/13/18 0500 04/13/18 0600  BP: (!) 152/65 (!) 151/64  Pulse: 74 91  Resp: 17 16  Temp:    SpO2: 95% 93%    Physical Exam: Cardiac:  regular Lungs:  Non labored Incisions:  Bilateral pravena vac in place with good seal Extremities:  +PT/peroneal  doppler signal present   CBC    Component Value Date/Time   WBC 8.9 04/13/2018 0435   RBC 2.63 (L) 04/13/2018 0435   HGB 8.0 (L) 04/13/2018 0435   HCT 24.7 (L) 04/13/2018 0435   PLT 134 (L) 04/13/2018 0435   MCV 93.9 04/13/2018 0435   MCH 30.4 04/13/2018 0435   MCHC 32.4 04/13/2018 0435   RDW 15.6 (H) 04/13/2018 0435   LYMPHSABS 0.7 03/19/2017 2109   MONOABS 0.7 03/19/2017 2109   EOSABS 0.0 03/19/2017 2109   BASOSABS 0.0 03/19/2017 2109    BMET    Component Value Date/Time   NA 140 04/13/2018 0435   K 4.1 04/13/2018 0435   CL 110 04/13/2018 0435   CO2 24 04/13/2018 0435   GLUCOSE 108 (H) 04/13/2018 0435   BUN 27 (H) 04/13/2018 0435   CREATININE 1.13 04/13/2018 0435   CALCIUM 8.3 (L) 04/13/2018 0435   GFRNONAA 54 (L) 04/13/2018 0435   GFRAA >60 04/13/2018 0435    INR    Component Value Date/Time   INR 1.38 04/11/2018 1613     Intake/Output Summary (Last 24 hours) at 04/13/2018 0740 Last data filed at 04/13/2018 0603 Gross per 24 hour  Intake 380 ml  Output 1068 ml  Net -688 ml     Assessment:  84 y.o. male is s/p:  1.  Repair of right external iliac vein and oversewing of right external iliac artery (Dr. Roxy Manns) 2.  Endarterectomy of right common femoral artery and left to right femoral-femoral bypass with 8 mm Dacron graft (Dr. Scot Dock)  2 Days Post-Op  Plan: -PT/peroneal doppler signals present bilateral feet. -discussed with pt and family member when the vacs will come off and wound care after that.  Discussed to keep them dry at all times except  in the shower.  Family member concerned as pt had a wound a couple of years ago that took over a year to heal. -out of bed today/PT -transfer to stepdown per primary team -DVT prophylaxis:  Per primary team   Leontine Locket, PA-C [facility] [phone] 04/13/2018 7:40 AM

## 2018-04-13 NOTE — Progress Notes (Signed)
Came to ambulate at 1045 this am but pt fatigued and wanted to wait till after lunch (1330). On return, pt is working with PT. Will f/u am. Morrison CES, ACSM 1:29 PM 04/13/2018

## 2018-04-13 NOTE — Progress Notes (Signed)
Paged Grandville Silos PA to clarify order for 40 mg Lasix IVP in addition to 40 mg Lasix p.o. Ordered to d/c order for IVP Lasix.

## 2018-04-13 NOTE — Evaluation (Signed)
Physical Therapy Evaluation Patient Details Name: Dalton Townsend MRN: [id number] DOB: 12/20/1922 Today's Date: 04/13/2018   History of Present Illness  84 yo male with PMH of CAD s/p CABG, severe AS, SSS s/p PPM placed 7/11, HTN, HL, anemia, and CVA admitted for femoral-femoral bypass, balloon aortic valvuloplasty on 04/11/18.   Clinical Impression  Pt admitted with above diagnosis. Pt currently with functional limitations due to the deficits listed below (see PT Problem List). Pt ambulated 120' with RW, HR 95, distance limited by fatigue. Pt will benefit from skilled PT to increase their independence and safety with mobility to allow discharge to the venue listed below.       Follow Up Recommendations No PT follow up;Supervision for mobility/OOB    Equipment Recommendations  None recommended by PT    Recommendations for Other Services       Precautions / Restrictions Precautions Precautions: Fall Precaution Comments: pt reports 1 fall in past 2 years, it was 13 months ago following a 5 hour car drive Restrictions Weight Bearing Restrictions: No      Mobility  Bed Mobility               General bed mobility comments: OOB in chair   Transfers Overall transfer level: Needs assistance Equipment used: Rolling walker (2 wheeled) Transfers: Sit to/from Stand Sit to Stand: Min guard         General transfer comment: min guard to power up and steady with RW, decreased eccentric control with sitting EoB after ambulation   Ambulation/Gait Ambulation/Gait assistance: Min guard Gait Distance (Feet): 120 Feet Assistive device: Rolling walker (2 wheeled) Gait Pattern/deviations: Shuffle;Decreased stride length;Step-through pattern;Trunk flexed Gait velocity: decreased   General Gait Details: HR 95 with walking, no loss of balance, VCs to lift head  Stairs            Wheelchair Mobility    Modified Rankin (Stroke Patients Only)       Balance Overall  balance assessment: Modified Independent                                           Pertinent Vitals/Pain Pain Assessment: No/denies pain    Home Living Family/patient expects to be discharged to:: Private residence Living Arrangements: Spouse/significant other Available Help at Discharge: Family;Available 24 hours/day Type of Home: House Home Access: Stairs to enter Entrance Stairs-Rails: Right Entrance Stairs-Number of Steps: 1 Home Layout: One level;Able to live on main level with bedroom/bathroom;Laundry or work area in [place]: Environmental consultant - 2 wheels;Bedside commode;Grab bars - tub/shower;Cane - single point      Prior Function Level of Independence: Independent with assistive device(s)         Comments: uses SPC     Hand Dominance   Dominant Hand: (ambidexterous)    Extremity/Trunk Assessment   Upper Extremity Assessment Upper Extremity Assessment: Overall WFL for tasks assessed    Lower Extremity Assessment Lower Extremity Assessment: Overall WFL for tasks assessed    Cervical / Trunk Assessment Cervical / Trunk Assessment: Kyphotic(pronounced forward head, daughter reports this is baseline but has gotten worse recently)  Communication   Communication: No difficulties  Cognition Arousal/Alertness: Awake/alert Behavior During Therapy: WFL for tasks assessed/performed Overall Cognitive Status: Within Functional Limits for tasks assessed  General Comments      Exercises     Assessment/Plan    PT Assessment Patient needs continued PT services  PT Problem List Decreased activity tolerance       PT Treatment Interventions DME instruction;Gait training;Stair training;Functional mobility training;Therapeutic activities;Therapeutic exercise;Patient/family education    PT Goals (Current goals can be found in the Care Plan section)  Acute Rehab PT Goals Patient Stated  Goal: go home soon PT Goal Formulation: With patient Time For Goal Achievement: 04/18/18 Potential to Achieve Goals: Good    Frequency Min 3X/week   Barriers to discharge        Co-evaluation               AM-PAC PT "6 Clicks" Daily Activity  Outcome Measure Difficulty turning over in bed (including adjusting bedclothes, sheets and blankets)?: A Little Difficulty moving from lying on back to sitting on the side of the bed? : A Lot Difficulty sitting down on and standing up from a chair with arms (e.g., wheelchair, bedside commode, etc,.)?: A Little Help needed moving to and from a bed to chair (including a wheelchair)?: A Little Help needed walking in hospital room?: A Little Help needed climbing 3-5 steps with a railing? : A Little 6 Click Score: 17    End of Session Equipment Utilized During Treatment: Gait belt Activity Tolerance: Patient tolerated treatment well Patient left: in bed;with call bell/phone within reach;with family/visitor present Nurse Communication: Mobility status PT Visit Diagnosis: Muscle weakness (generalized) (M62.81);Other abnormalities of gait and mobility (R26.89)    Time: 7353-2992 PT Time Calculation (min) (ACUTE ONLY): 33 min   Charges:   PT Evaluation $PT Eval Low Complexity: 1 Low PT Treatments $Gait Training: 8-22 mins          Blondell Reveal Kistler 04/13/2018, 2:06 PM [phone]

## 2018-04-14 LAB — CBC
HEMATOCRIT: 25.4 % — AB (ref 39.0–52.0)
HEMOGLOBIN: 8 g/dL — AB (ref 13.0–17.0)
MCH: 29.6 pg (ref 26.0–34.0)
MCHC: 31.5 g/dL (ref 30.0–36.0)
MCV: 94.1 fL (ref 78.0–100.0)
Platelets: 177 10*3/uL (ref 150–400)
RBC: 2.7 MIL/uL — ABNORMAL LOW (ref 4.22–5.81)
RDW: 15.4 % (ref 11.5–15.5)
WBC: 8.7 10*3/uL (ref 4.0–10.5)

## 2018-04-14 LAB — BASIC METABOLIC PANEL
ANION GAP: 5 (ref 5–15)
BUN: 24 mg/dL — ABNORMAL HIGH (ref 8–23)
CALCIUM: 8.2 mg/dL — AB (ref 8.9–10.3)
CO2: 24 mmol/L (ref 22–32)
Chloride: 110 mmol/L (ref 98–111)
Creatinine, Ser: 1.04 mg/dL (ref 0.61–1.24)
GFR calc non Af Amer: >60 mL/min (ref >60)
Glucose, Bld: 97 mg/dL (ref 70–99)
Potassium: 4.1 mmol/L (ref 3.5–5.1)
SODIUM: 139 mmol/L (ref 135–145)

## 2018-04-14 MED ORDER — POLYSACCHARIDE IRON COMPLEX 150 MG PO CAPS
150.0000 mg | ORAL_CAPSULE | Freq: Every day | ORAL | Status: DC
  Administered 2018-04-14 – 2018-04-22 (×8): 150 mg via ORAL
  Filled 2018-04-14 (×8): qty 1

## 2018-04-14 MED ORDER — ENOXAPARIN SODIUM 30 MG/0.3ML ~~LOC~~ SOLN
30.0000 mg | SUBCUTANEOUS | Status: DC
  Administered 2018-04-14 – 2018-04-17 (×4): 30 mg via SUBCUTANEOUS
  Filled 2018-04-14 (×4): qty 0.3

## 2018-04-14 MED ORDER — FA-PYRIDOXINE-CYANOCOBALAMIN 2.5-25-2 MG PO TABS
1.0000 | ORAL_TABLET | Freq: Every day | ORAL | Status: DC
  Administered 2018-04-14 – 2018-04-22 (×8): 1 via ORAL
  Filled 2018-04-14 (×9): qty 1

## 2018-04-14 NOTE — Progress Notes (Addendum)
[place] VALVE TEAM  Patient Name: Dalton Townsend Date of Encounter: 04/14/2018  Primary Cardiologist: Dr. Marlou Porch / Dr. Burt Knack & Dr. Roxy Manns (TAVR)  Hospital Problem List     Principal Problem:   Severe aortic stenosis Active Problems:   HTN (hypertension)   Ulcerative colitis ([place])   Acute on chronic congestive heart failure (HCC)   Presence of permanent cardiac pacemaker   PAF (paroxysmal atrial fibrillation) (HCC)   Carotid artery disease (HCC)   CAD (coronary artery disease)   BPH (benign prostatic hyperplasia)   GAD (generalized anxiety disorder)   Aortic stenosis     Subjective   Up eating breakfast. Daughter in room. He is feeling pretty good. No complaints. Breathing fine. Thinking about whether or not they want to proceed with TAVR next Tuesday.   Inpatient Medications    Scheduled Meds: . acetaminophen  1,000 mg Oral Q6H  . amLODipine  5 mg Oral Daily  . atorvastatin  20 mg Oral QODAY  . balsalazide  2,250 mg Oral TID WC  . bisacodyl  10 mg Oral Daily   Or  . bisacodyl  10 mg Rectal Daily  . docusate sodium  200 mg Oral Daily  . enoxaparin (LOVENOX) injection  30 mg Subcutaneous Q24H  . escitalopram  10 mg Oral Daily  . finasteride  5 mg Oral Once per day on Sun Mon Tue Thu Fri Sat  . folic acid-pyridoxine-cyancobalamin  1 tablet Oral Daily  . furosemide  40 mg Oral BID  . iron polysaccharides  150 mg Oral Daily  . latanoprost  1 drop Both Eyes QHS  . metoprolol tartrate  12.5 mg Oral BID  . mupirocin ointment   Nasal BID  . pantoprazole  40 mg Oral Daily  . potassium chloride  10 mEq Oral BID WC  . sodium chloride flush  3 mL Intravenous Q12H   Continuous Infusions: . sodium chloride     PRN Meds: sodium chloride, fentaNYL (SUBLIMAZE) injection, metoprolol tartrate, morphine injection, ondansetron (ZOFRAN) IV, oxyCODONE, sodium chloride flush, traMADol   Vital Signs    Vitals:   04/13/18 1954  04/14/18 0008 04/14/18 0347 04/14/18 0518  BP: 119/69 138/63 133/76 120/86  Pulse: 96 85 78 100  Resp: (!) 22 17 (!) 21 (!) 26  Temp: 98.1 F (36.7 C) 97.7 F (36.5 C) 98.1 F (36.7 C) 97.9 F (36.6 C)  TempSrc: Oral Oral Oral Oral  SpO2: 96% 94% 95% 96%  Weight:    92.4 kg  Height:        Intake/Output Summary (Last 24 hours) at 04/14/2018 0907 Last data filed at 04/14/2018 8088 Gross per 24 hour  Intake 560 ml  Output 1420 ml  Net -860 ml   Filed Weights   04/12/18 0421 04/13/18 0600 04/14/18 0518  Weight: 84.9 kg 86.2 kg 92.4 kg    Physical Exam   GEN: Well nourished, well developed, in no acute distress.  HEENT: Grossly normal.  Neck: Supple, no JVD or masses. Cardiac: RRR, + harsh systolic murmur. No rubs, or gallops. No clubbing, cyanosis, edema.  Radials/DP/PT 2+ and equal bilaterally.  Respiratory:  Respirations regular and unlabored, clear to auscultation bilaterally. GI: Soft, nontender, nondistended, BS + x 4. MS: no deformity or atrophy. Skin: warm and dry, no rash. Dressings intact. Trace pretibial edema and some swelling in arms as well.  Neuro:  Strength and sensation are intact. Psych: AAOx3.  Normal affect.  Labs  CBC Recent Labs    04/13/18 0435 04/14/18 0335  WBC 8.9 8.7  HGB 8.0* 8.0*  HCT 24.7* 25.4*  MCV 93.9 94.1  PLT 134* 450   Basic Metabolic Panel Recent Labs    04/12/18 0411 04/12/18 1547  04/13/18 0435 04/14/18 0335  NA 138  --    < > 140 139  K 4.4  --    < > 4.1 4.1  CL 109  --    < > 110 110  CO2 21*  --   --  24 24  GLUCOSE 160*  --    < > 108* 97  BUN 31*  --    < > 27* 24*  CREATININE 1.23 1.26*   < > 1.13 1.04  CALCIUM 7.9*  --   --  8.3* 8.2*  MG 2.0 2.2  --   --   --    < > = values in this interval not displayed.   Liver Function Tests No results for input(s): AST, ALT, ALKPHOS, BILITOT, PROT, ALBUMIN in the last 72 hours. No results for input(s): LIPASE, AMYLASE in the last 72 hours. Cardiac  Enzymes Recent Labs    04/12/18 0411  CKTOTAL 454*   BNP Invalid input(s): POCBNP D-Dimer No results for input(s): DDIMER in the last 72 hours. Hemoglobin A1C No results for input(s): HGBA1C in the last 72 hours. Fasting Lipid Panel No results for input(s): CHOL, HDL, LDLCALC, TRIG, CHOLHDL, LDLDIRECT in the last 72 hours. Thyroid Function Tests No results for input(s): TSH, T4TOTAL, T3FREE, THYROIDAB in the last 72 hours.  Invalid input(s): FREET3  Telemetry    V paced - Personally Reviewed  ECG    AV paced - Personally Reviewed  Radiology    Dg Chest Port 1 View  Result Date: 04/13/2018 CLINICAL DATA:  Sore chest. EXAM: PORTABLE CHEST 1 VIEW COMPARISON:  April 12, 2018 FINDINGS: No pneumothorax. Stable cardiomediastinal silhouette. Stable right central line. Mild opacity in left retrocardiac region is stable, possibly atelectasis. Mild atelectasis in the right mid lung. IMPRESSION: No acute interval change. Electronically Signed   By: Dorise Bullion III M.D   On: 04/13/2018 09:11    Cardiac Studies   TAVR OPERATIVE NOTE   Date of Procedure:                04/11/2018  Preoperative Diagnosis:      Severe Aortic Stenosis  Procedure:      ? Balloon aortic valvuloplasty              Co-Surgeons:                        Valentina Gu. Roxy Manns, MD and Sherren Mocha, MD  Pre-operative Echo Findings: ? Severe aortic stenosis ? Normal left ventricular systolic function  _______________  2D ECHO 04/13/18: Study Conclusions - Left ventricle: The cavity size was normal. Systolic function was   normal. The estimated ejection fraction was in the range of 60%   to 65%. Wall motion was normal; there were no regional wall   motion abnormalities. Doppler parameters are consistent with   abnormal left ventricular relaxation (grade 1 diastolic   dysfunction). Doppler parameters are consistent with elevated   ventricular end-diastolic filling pressure. - Aortic valve: There  was critical stenosis. There was mild   regurgitation. Valve area (VTI): 0.55 cm^2. Valve area (Vmean):   0.54 cm^2. - Mitral valve: Calcified annulus. Mildly thickened leaflets .   There  was mild regurgitation. - Left atrium: The atrium was moderately dilated. - Right atrium: The atrium was moderately dilated. - Tricuspid valve: There was moderate regurgitation. - Pulmonary arteries: Systolic pressure was mildly increased. PA   peak pressure: 39 mm Hg (S). - Inferior vena cava: The vessel was normal in size. Impressions: - Critical aortic stenosis with peak/mean gradients 99/65 mmHg.   Preserved LVEF 60-65%.   Patient Profile      Mr. Schreier is a 84 year old male with a history of CAD s/p CABG (1999), PAD s/p R CEA (2001) w/ 80-99% RICA occlusion now, hx of CVA, s/p PPM, HTN, PAF on Eliquis, ulcerative colitis and severe AS who presented to [facility] on 04/11/18 for planned TAVR.   Assessment & Plan   Severe AS: during TAVR procedure, there was a vascular site complication that required right external iliac artery oversewing and emergent fem-fem bypass by Dr. Scot Dock. TAVR valve was not able to be deployed but he did get a BAV. Echocardiogram 04/13/18 show persistent critical aortic stenosis. We may proceed with TAVR next Tuesday from a transapical approach with Dr. Burt Knack and Dr. Cyndia Bent, but he would like to discuss with family before making a final decision.   Fem-fem bypass: completed by Dr. Scot Dock. Preveena dressings in place with good seal. Doppler with good flow in both feet per vascular   Acute on chronic diastolic CHF: CXR yesterday shows mild vascular congestion. Weight continues to climb 184--> 203 lbs. He has been started on po Lasix 26m BID.   Carotid artery disease: pre TAVR dopplers showed 80-99% RICA stenosis where he previously had R CEA. Evaluated by Dr. CBridgett Larssonprior to admission. Plan was for outpatient CT and follow up.   HTN: BP well controlled currently.    PAF: tele  shows paced rhythm. Eliquis on hold given recent vascular surgery and possible TAVR via transapical approach next Tuesday  CAD s/p CABG x6V: pre TAVR cath showed patent 6/6 bypass grafts. Continue medical therapy.   Post operative anemia: Hg remains stable ~8. Continue to monitor  Signed, KAngelena Form PA-C  04/14/2018, 9:07 AM  Pager [phone] I have seen and examined the patient and agree with the assessment and plan as outlined.  Mr. GGheenappears to be recovering remarkably well.  I have personally reviewed the follow-up echocardiogram he had performed yesterday which demonstrates essentially critical aortic stenosis and mild aortic insufficiency.  There does not appear to be any appreciable improvement in the transvalvular gradient since he underwent balloon valvuloplasty early this week.  Left ventricular function remains normal.  I discussed treatment options at length with the patient and his daughter at the bedside this morning.  We discussed whether or not to proceed with transcatheter aortic valve replacement.  We discussed long-term prognosis without any further intervention for treatment of his aortic stenosis.  We have reviewed the patient's previous CT angiograms and feel that if he were to opt for proceeding with transcatheter aortic valve replacement we would need to utilize the transapical approach.  We discussed the indications, risk, and benefits at length.  We discussed the timing of surgery including whether or not to keep him here in the hospital and proceed with surgery early next week versus delaying for a period of time.  Given the severity of his aortic stenosis I favor proceeding with surgery sooner rather than later.  All of their questions have been addressed    CRexene Alberts MD 04/14/2018 8:00am

## 2018-04-14 NOTE — Progress Notes (Signed)
  Progress Note    04/14/2018 7:57 AM 3 Days Post-Op  Subjective:  Soreness in groins and along tunneled fem fem tract   Vitals:   04/14/18 0347 04/14/18 0518  BP: 133/76 120/86  Pulse: 78 100  Resp: (!) 21 (!) 26  Temp: 98.1 F (36.7 C) 97.9 F (36.6 C)  SpO2: 95% 96%   Physical Exam: Cardiac:  Irregular Lungs:  Non labored on RA Incisions:  preveena incision vacs on groins with good seal; minimal output; no tunnel tract ecchymosis, mild edema Extremities:  PT signals BLE Abdomen:  Soft Neurologic: A&O  CBC    Component Value Date/Time   WBC 8.7 04/14/2018 0335   RBC 2.70 (L) 04/14/2018 0335   HGB 8.0 (L) 04/14/2018 0335   HCT 25.4 (L) 04/14/2018 0335   PLT 177 04/14/2018 0335   MCV 94.1 04/14/2018 0335   MCH 29.6 04/14/2018 0335   MCHC 31.5 04/14/2018 0335   RDW 15.4 04/14/2018 0335   LYMPHSABS 0.7 03/19/2017 2109   MONOABS 0.7 03/19/2017 2109   EOSABS 0.0 03/19/2017 2109   BASOSABS 0.0 03/19/2017 2109    BMET    Component Value Date/Time   NA 139 04/14/2018 0335   K 4.1 04/14/2018 0335   CL 110 04/14/2018 0335   CO2 24 04/14/2018 0335   GLUCOSE 97 04/14/2018 0335   BUN 24 (H) 04/14/2018 0335   CREATININE 1.04 04/14/2018 0335   CALCIUM 8.2 (L) 04/14/2018 0335   GFRNONAA >60 04/14/2018 0335   GFRAA >60 04/14/2018 0335    INR    Component Value Date/Time   INR 1.38 04/11/2018 1613     Intake/Output Summary (Last 24 hours) at 04/14/2018 0757 Last data filed at 04/14/2018 8466 Gross per 24 hour  Intake 660 ml  Output 1540 ml  Net -880 ml     Assessment/Plan:  84 y.o. male is s/p R CFA endarterectomy and L to R fem fem bypass 3 Days Post-Op   -Perfusing BLE well with PTA signals by doppler -Incision vacs in groins will remain in place for 7 days post op; ok for patient to remove at home if discharged -Follow up appt made with Dr. Scot Dock; ok for discharge when cleared by primary service   Dagoberto Ligas, PA-C [facility] [phone] 04/14/2018 7:57 AM

## 2018-04-14 NOTE — Care Management Note (Addendum)
Case Management Note Marvetta Gibbons RN, BSN Unit 4E- RN Care Coordinator  [phone]  Patient Details  Name: MUHAMED LUECKE MRN: [id number] Date of Birth: [birth year]  Subjective/Objective:  Pt admitted for TAVR, however had complications in OR  That required emergent fem-fembypass  Pt s/p  R CFA endarterectomy and L to R fem fem bypass - TAVR was not able to be done- and pt may proceed with TAVR next Tues? Pt with bil groin Prevena VACs- if pt goes home with these will need to change out to smaller battery vacs from OR to go home with.                Action/Plan: PTA Pt lived at home with wife, notified by Jonelle Sidle with [facility] they have referral for [facility] needs and will f/u with pt post discharge. CM will follow for any further transition of care needs  Expected Discharge Date:                  Expected Discharge Plan:  [place]  In-House Referral:     Discharge planning Services  CM Consult  Post Acute Care Choice:    Choice offered to:     DME Arranged:  Vac DME Agency:  [facility]  HH Arranged:    [place] Agency:  [facility]  Status of Service:  In process, will continue to follow  If discussed at Long Length of Stay Meetings, dates discussed:    Discharge Disposition:   Additional Comments:  Dawayne Patricia, RN 04/14/2018, 11:54 AM

## 2018-04-14 NOTE — Progress Notes (Signed)
Pt assisted to ambulate hall way using RW.  Approx 150 ft, no complaints, no rests.  To recliner after walk.  Will con't plan of care

## 2018-04-14 NOTE — Progress Notes (Signed)
CARDIAC REHAB PHASE I   PRE:  Rate/Rhythm: 82 pacing    BP: sitting 125/66    SaO2: 97 RA  MODE:  Ambulation: 200 ft   POST:  Rate/Rhythm: 95 pacing    BP: sitting 185/82     SaO2: 98 RA  Pt stood fairly independently. Walked with RW and gait belt assist x1 and another assist with wound vac. Needed reminders to stand taller, has some posture issues at baseline. Some SOB with distance, overall tolerated well. Return to recliner. Daughter asking if he will be ready for surgery on Tuesday. Encouraged him to walk x2-3 a day, sit in recliner, and IS x10 every hour. Will f/u tomorrow. He is best with x2 people due to need to carry wound vac. [id number]   [place], ACSM 04/14/2018 12:00 PM

## 2018-04-15 DIAGNOSIS — I350 Nonrheumatic aortic (valve) stenosis: Principal | ICD-10-CM

## 2018-04-15 LAB — CBC
HCT: 26.1 % — ABNORMAL LOW (ref 39.0–52.0)
HEMOGLOBIN: 8.1 g/dL — AB (ref 13.0–17.0)
MCH: 29.8 pg (ref 26.0–34.0)
MCHC: 31 g/dL (ref 30.0–36.0)
MCV: 96 fL (ref 78.0–100.0)
PLATELETS: 228 10*3/uL (ref 150–400)
RBC: 2.72 MIL/uL — AB (ref 4.22–5.81)
RDW: 15.2 % (ref 11.5–15.5)
WBC: 8 10*3/uL (ref 4.0–10.5)

## 2018-04-15 LAB — TYPE AND SCREEN
ABO/RH(D): A NEG
ANTIBODY SCREEN: NEGATIVE
UNIT DIVISION: 0
UNIT DIVISION: 0
UNIT DIVISION: 0
UNIT DIVISION: 0
UNIT DIVISION: 0
Unit division: 0
Unit division: 0
Unit division: 0
Unit division: 0
Unit division: 0

## 2018-04-15 LAB — BPAM RBC
BLOOD PRODUCT EXPIRATION DATE: 201908292359
BLOOD PRODUCT EXPIRATION DATE: 201908292359
Blood Product Expiration Date: 201908252359
Blood Product Expiration Date: 201908262359
Blood Product Expiration Date: 201908272359
Blood Product Expiration Date: 201908282359
Blood Product Expiration Date: 201908282359
Blood Product Expiration Date: 201908292359
Blood Product Expiration Date: 201908292359
Blood Product Expiration Date: 201908292359
ISSUE DATE / TIME: 201908131206
ISSUE DATE / TIME: 201908131206
ISSUE DATE / TIME: 201908131221
ISSUE DATE / TIME: 201908131221
ISSUE DATE / TIME: 201908131221
ISSUE DATE / TIME: 201908131221
UNIT TYPE AND RH: 600
UNIT TYPE AND RH: 600
UNIT TYPE AND RH: 600
UNIT TYPE AND RH: 600
UNIT TYPE AND RH: 600
UNIT TYPE AND RH: 600
Unit Type and Rh: 600
Unit Type and Rh: 600
Unit Type and Rh: 600
Unit Type and Rh: 600

## 2018-04-15 LAB — BASIC METABOLIC PANEL
ANION GAP: 6 (ref 5–15)
BUN: 24 mg/dL — ABNORMAL HIGH (ref 8–23)
CALCIUM: 8.6 mg/dL — AB (ref 8.9–10.3)
CHLORIDE: 108 mmol/L (ref 98–111)
CO2: 23 mmol/L (ref 22–32)
Creatinine, Ser: 1.15 mg/dL (ref 0.61–1.24)
GFR calc non Af Amer: 53 mL/min — ABNORMAL LOW (ref >60)
Glucose, Bld: 209 mg/dL — ABNORMAL HIGH (ref 70–99)
Potassium: 4.1 mmol/L (ref 3.5–5.1)
SODIUM: 137 mmol/L (ref 135–145)

## 2018-04-15 MED ORDER — METOPROLOL TARTRATE 25 MG PO TABS
25.0000 mg | ORAL_TABLET | Freq: Two times a day (BID) | ORAL | Status: DC
  Administered 2018-04-15 – 2018-04-17 (×6): 25 mg via ORAL
  Filled 2018-04-15 (×6): qty 1

## 2018-04-15 NOTE — Progress Notes (Addendum)
[place] VALVE TEAM  Patient Name: Dalton Townsend Date of Encounter: 04/15/2018  Primary Cardiologist: Dr. Marlou Porch / Dr. Burt Knack & Dr. Roxy Manns (TAVR)  Hospital Problem List     Principal Problem:   Severe aortic stenosis Active Problems:   HTN (hypertension)   Ulcerative colitis ([place])   Acute on chronic congestive heart failure (HCC)   Presence of permanent cardiac pacemaker   PAF (paroxysmal atrial fibrillation) (HCC)   Carotid artery disease (HCC)   CAD (coronary artery disease)   BPH (benign prostatic hyperplasia)   GAD (generalized anxiety disorder)   Aortic stenosis     Subjective   Eating breakfast. Breathing is fine. He says vascular PA said dressings would be removed today.   Inpatient Medications    Scheduled Meds: . acetaminophen  1,000 mg Oral Q6H  . amLODipine  5 mg Oral Daily  . atorvastatin  20 mg Oral QODAY  . balsalazide  2,250 mg Oral TID WC  . bisacodyl  10 mg Oral Daily   Or  . bisacodyl  10 mg Rectal Daily  . docusate sodium  200 mg Oral Daily  . enoxaparin (LOVENOX) injection  30 mg Subcutaneous Q24H  . escitalopram  10 mg Oral Daily  . finasteride  5 mg Oral Once per day on Sun Mon Tue Thu Fri Sat  . folic acid-pyridoxine-cyancobalamin  1 tablet Oral Daily  . furosemide  40 mg Oral BID  . iron polysaccharides  150 mg Oral Daily  . latanoprost  1 drop Both Eyes QHS  . metoprolol tartrate  12.5 mg Oral BID  . mupirocin ointment   Nasal BID  . pantoprazole  40 mg Oral Daily  . potassium chloride  10 mEq Oral BID WC  . sodium chloride flush  3 mL Intravenous Q12H   Continuous Infusions: . sodium chloride     PRN Meds: sodium chloride, fentaNYL (SUBLIMAZE) injection, metoprolol tartrate, morphine injection, ondansetron (ZOFRAN) IV, oxyCODONE, sodium chloride flush, traMADol   Vital Signs    Vitals:   04/14/18 2112 04/14/18 2347 04/15/18 0417 04/15/18 0726  BP: 140/88 126/77 130/87 118/65    Pulse: 95 (!) 105 94   Resp: 20 (!) 21 (!) 22 18  Temp: 97.7 F (36.5 C) 97.6 F (36.4 C) 98.7 F (37.1 C) 98.6 F (37 C)  TempSrc: Oral Oral Oral Oral  SpO2: 98% 100% 100% 98%  Weight:   88.7 kg   Height:        Intake/Output Summary (Last 24 hours) at 04/15/2018 0828 Last data filed at 04/14/2018 2348 Gross per 24 hour  Intake 600 ml  Output 1576 ml  Net -976 ml   Filed Weights   04/13/18 0600 04/14/18 0518 04/15/18 0417  Weight: 86.2 kg 92.4 kg 88.7 kg    Physical Exam   GEN: Well nourished, well developed, in no acute distress.  HEENT: Grossly normal.  Neck: Supple, no JVD or masses. Cardiac: tachy, + harsh systolic murmur. No rubs, or gallops. No clubbing, cyanosis, edema.  Radials/DP/PT 2+ and equal bilaterally.  Respiratory:  Respirations regular and unlabored, clear to auscultation bilaterally. GI: Soft, nontender, nondistended, BS + x 4. MS: no deformity or atrophy. Skin: warm and dry, no rash. Dressings intact. Neuro:  Strength and sensation are intact. Psych: AAOx3.  Normal affect.  Labs    CBC Recent Labs    04/13/18 0435 04/14/18 0335  WBC 8.9 8.7  HGB 8.0* 8.0*  HCT 24.7* 25.4*  MCV 93.9 94.1  PLT 134* 329   Basic Metabolic Panel Recent Labs    04/12/18 1547  04/13/18 0435 04/14/18 0335  NA  --    < > 140 139  K  --    < > 4.1 4.1  CL  --    < > 110 110  CO2  --   --  24 24  GLUCOSE  --    < > 108* 97  BUN  --    < > 27* 24*  CREATININE 1.26*   < > 1.13 1.04  CALCIUM  --   --  8.3* 8.2*  MG 2.2  --   --   --    < > = values in this interval not displayed.   Liver Function Tests No results for input(s): AST, ALT, ALKPHOS, BILITOT, PROT, ALBUMIN in the last 72 hours. No results for input(s): LIPASE, AMYLASE in the last 72 hours. Cardiac Enzymes No results for input(s): CKTOTAL, CKMB, CKMBINDEX, TROPONINI in the last 72 hours. BNP Invalid input(s): POCBNP D-Dimer No results for input(s): DDIMER in the last 72 hours. Hemoglobin  A1C No results for input(s): HGBA1C in the last 72 hours. Fasting Lipid Panel No results for input(s): CHOL, HDL, LDLCALC, TRIG, CHOLHDL, LDLDIRECT in the last 72 hours. Thyroid Function Tests No results for input(s): TSH, T4TOTAL, T3FREE, THYROIDAB in the last 72 hours.  Invalid input(s): FREET3  Telemetry    V paced, tachy 100-105 - Personally Reviewed  ECG    AV paced - Personally Reviewed  Radiology    No results found.  Cardiac Studies   TAVR OPERATIVE NOTE   Date of Procedure:                04/11/2018  Preoperative Diagnosis:      Severe Aortic Stenosis  Procedure:      ? Balloon aortic valvuloplasty              Co-Surgeons:                        Braulio Conte H. Roxy Manns, MD and Sherren Mocha, MD  Pre-operative Echo Findings: ? Severe aortic stenosis ? Normal left ventricular systolic function  _______________  2D ECHO 04/13/18: Study Conclusions - Left ventricle: The cavity size was normal. Systolic function was   normal. The estimated ejection fraction was in the range of 60%   to 65%. Wall motion was normal; there were no regional wall   motion abnormalities. Doppler parameters are consistent with   abnormal left ventricular relaxation (grade 1 diastolic   dysfunction). Doppler parameters are consistent with elevated   ventricular end-diastolic filling pressure. - Aortic valve: There was critical stenosis. There was mild   regurgitation. Valve area (VTI): 0.55 cm^2. Valve area (Vmean):   0.54 cm^2. - Mitral valve: Calcified annulus. Mildly thickened leaflets .   There was mild regurgitation. - Left atrium: The atrium was moderately dilated. - Right atrium: The atrium was moderately dilated. - Tricuspid valve: There was moderate regurgitation. - Pulmonary arteries: Systolic pressure was mildly increased. PA   peak pressure: 39 mm Hg (S). - Inferior vena cava: The vessel was normal in size. Impressions: - Critical aortic stenosis with peak/mean  gradients 99/65 mmHg.   Preserved LVEF 60-65%.   Patient Profile      Mr. Eoff is a 84 year old male with a history of CAD s/p CABG (1999), PAD s/p R CEA (2001) w/ 80-99% RICA  occlusion now, hx of CVA, s/p PPM, HTN, PAF on Eliquis, ulcerative colitis and severe AS who presented to [facility] on 04/11[facility]18 for planned TAVR.   Assessment & Plan   Severe AS: during TAVR procedure, there was a vascular site complication that required right external iliac artery oversewing and emergent fem-fem bypass by Dr. Scot Dock. TAVR valve was not able to be deployed but he did get a BAV. Echocardiogram 04/13/18 show persistent critical aortic stenosis. Plan is for TAVR next Tuesday from a transapical ap proach with Dr. Burt Knack and Dr. Cyndia Bent.  Fem-fem bypass: completed by Dr. Scot Dock. Preveena dressings in place with good seal. Doppler with good flow in both feet per vascular. I do not see a note yet, but patient says vascular surgery PA was in this AM and said they were going to remove dressings later today.   Acute on chronic diastolic CHF: he had some post op hypervolemia that is responding well to po lasix 53m BID. Weight trending down. Creat has remained stable. No BMET from today. I will order labs   Carotid artery disease: pre TAVR dopplers showed 80-99% RICA stenosis where he previously had R CEA. Evaluated by Dr. CBridgett Larssonprior to admission. Plan was for outpatient CT and follow up.   HTN: BP well controlled currently.    PAF: tele shows paced rhythm. Eliquis on hold given recent vascular surgery and TAVR via transapical approach next Tuesday  CAD s/p CABG x6V: pre TAVR cath showed patent 6/6 bypass grafts. Continue medical therapy.   Post operative anemia: Hg remains stable ~8. Continue to monitor  Tachycardia: he is being V paced around 100-105 bpm while sitting in bed. No s/s infection. ? Underlying Afib. I will increase Lopressor to 273mBID.    Signed, KaAngelena FormPA-C  04/15/2018, 8:28 AM    Pager [phone] I have seen and examined the patient and agree with the assessment and plan as outlined.  Increase metoprolol.  Volume status looks better.  Mobilize.  For TAVR on Tuesday  ClRexene AlbertsMD 04/15/2018 11:46 AM

## 2018-04-15 NOTE — Progress Notes (Signed)
CARDIAC REHAB PHASE I   PRE:  Rate/Rhythm: V Paced 106  BP:    Sitting: 124/65     SaO2: 96% Room Air  MODE:  Ambulation: 200 ft   POST:  Rate/Rhythem: 119  BP:    Sitting: 124/65     SaO2: 98% 0945-1015 Ambulated with assistance times two with gait belt, walker and VAC. Gait steady. Tolerated well. Patient assisted back to recliner with call bell within reach. Daughter's present.  Harrell Gave RN BSN

## 2018-04-15 NOTE — Progress Notes (Signed)
[facility] [place]  Subjective  - Doing well.  Prevena vac accidentally turned off. S/p fem fem bypass.    Objective 102/61 86 (!) 97.3 F (36.3 C) (Oral) 20 100%  Intake/Output Summary (Last 24 hours) at 04/15/2018 1444 Last data filed at 04/15/2018 1300 Gross per 24 hour  Intake 840 ml  Output 1051 ml  Net -211 ml    General: Provena vac to both groins Groins soft  Assessment/Planning: Doing well s/p fem fem bypass.  Vac working again.  Will remove vac dressing this week.  Marty Heck 04/15/2018 2:44 PM --  Marty Heck, MD [facility] [place] Office: [phone] Pager: [phone]

## 2018-04-16 ENCOUNTER — Encounter (HOSPITAL_COMMUNITY): Payer: Medicare Other

## 2018-04-16 NOTE — Progress Notes (Addendum)
[place] VALVE TEAM  Patient Name: Dalton Townsend Date of Encounter: 04/16/2018  Primary Cardiologist: Dr. Marlou Porch / Dr. Burt Knack & Dr. Roxy Townsend (TAVR)  Hospital Problem List     Principal Problem:   Severe aortic stenosis Active Problems:   HTN (hypertension)   Ulcerative colitis ([place])   Acute on chronic congestive heart failure (HCC)   Presence of permanent cardiac pacemaker   PAF (paroxysmal atrial fibrillation) (HCC)   Carotid artery disease (HCC)   CAD (coronary artery disease)   BPH (benign prostatic hyperplasia)   GAD (generalized anxiety disorder)   Aortic stenosis     Subjective   No complaints. Feeling okay. Has several questions about surgery tuesday  Inpatient Medications    Scheduled Meds: . acetaminophen  1,000 mg Oral Q6H  . amLODipine  5 mg Oral Daily  . atorvastatin  20 mg Oral QODAY  . balsalazide  2,250 mg Oral TID WC  . bisacodyl  10 mg Oral Daily   Or  . bisacodyl  10 mg Rectal Daily  . docusate sodium  200 mg Oral Daily  . enoxaparin (LOVENOX) injection  30 mg Subcutaneous Q24H  . escitalopram  10 mg Oral Daily  . finasteride  5 mg Oral Once per day on Sun Mon Tue Thu Fri Sat  . folic acid-pyridoxine-cyancobalamin  1 tablet Oral Daily  . furosemide  40 mg Oral BID  . iron polysaccharides  150 mg Oral Daily  . latanoprost  1 drop Both Eyes QHS  . metoprolol tartrate  25 mg Oral BID  . mupirocin ointment   Nasal BID  . pantoprazole  40 mg Oral Daily  . potassium chloride  10 mEq Oral BID WC  . sodium chloride flush  3 mL Intravenous Q12H   Continuous Infusions: . sodium chloride     PRN Meds: sodium chloride, fentaNYL (SUBLIMAZE) injection, metoprolol tartrate, morphine injection, ondansetron (ZOFRAN) IV, oxyCODONE, sodium chloride flush, traMADol   Vital Signs    Vitals:   04/15/18 1552 04/15/18 2208 04/16/18 0059 04/16/18 0500  BP: 125/71 135/71 118/69 (!) 145/88  Pulse:  94 88 93  Resp:  19 19 15  (!) 22  Temp: 97.9 F (36.6 C) (!) 97.4 F (36.3 C) 98.2 F (36.8 C) 98.1 F (36.7 C)  TempSrc: Oral Oral Oral Oral  SpO2: 98% 99% 94% 97%  Weight:    86.7 kg  Height:        Intake/Output Summary (Last 24 hours) at 04/16/2018 0836 Last data filed at 04/16/2018 0503 Gross per 24 hour  Intake 1080 ml  Output 1225 ml  Net -145 ml   Filed Weights   04/14/18 0518 04/15/18 0417 04/16/18 0500  Weight: 92.4 kg 88.7 kg 86.7 kg    Physical Exam   GEN: Well nourished, well developed, in no acute distress.  HEENT: Grossly normal.  Neck: Supple, no JVD or masses. Cardiac:  RRR, + harsh systolic murmur. No rubs, or gallops. No clubbing, cyanosis, edema.  Radials/DP/PT 2+ and equal bilaterally.  Respiratory:  Respirations regular and unlabored, clear to auscultation bilaterally. GI: Soft, nontender, nondistended, BS + x 4. MS: no deformity or atrophy. Skin: warm and dry, no rash. Dressings intact. Neuro:  Strength and sensation are intact. Psych: AAOx3.  Normal affect.  Labs    CBC Recent Labs    04/14/18 0335 04/15/18 0939  WBC 8.7 8.0  HGB 8.0* 8.1*  HCT 25.4* 26.1*  MCV 94.1 96.0  PLT  177 003   Basic Metabolic Panel Recent Labs    04/14/18 0335 04/15/18 0939  NA 139 137  K 4.1 4.1  CL 110 108  CO2 24 23  GLUCOSE 97 209*  BUN 24* 24*  CREATININE 1.04 1.15  CALCIUM 8.2* 8.6*   Liver Function Tests No results for input(s): AST, ALT, ALKPHOS, BILITOT, PROT, ALBUMIN in the last 72 hours. No results for input(s): LIPASE, AMYLASE in the last 72 hours. Cardiac Enzymes No results for input(s): CKTOTAL, CKMB, CKMBINDEX, TROPONINI in the last 72 hours. BNP Invalid input(s): POCBNP D-Dimer No results for input(s): DDIMER in the last 72 hours. Hemoglobin A1C No results for input(s): HGBA1C in the last 72 hours. Fasting Lipid Panel No results for input(s): CHOL, HDL, LDLCALC, TRIG, CHOLHDL, LDLDIRECT in the last 72 hours. Thyroid Function Tests No results  for input(s): TSH, T4TOTAL, T3FREE, THYROIDAB in the last 72 hours.  Invalid input(s): FREET3  Telemetry    V paced, HR 90-110s, PVCs - Personally Reviewed  ECG    AV paced - Personally Reviewed  Radiology    No results found.  Cardiac Studies   TAVR OPERATIVE NOTE   Date of Procedure:                04/11/2018  Preoperative Diagnosis:      Severe Aortic Stenosis  Procedure:      ? Balloon aortic valvuloplasty              Co-Surgeons:                        Dalton Conte H. Roxy Manns, MD and Sherren Mocha, MD  Pre-operative Echo Findings: ? Severe aortic stenosis ? Normal left ventricular systolic function  _______________  2D ECHO 04/13/18: Study Conclusions - Left ventricle: The cavity size was normal. Systolic function was   normal. The estimated ejection fraction was in the range of 60%   to 65%. Wall motion was normal; there were no regional wall   motion abnormalities. Doppler parameters are consistent with   abnormal left ventricular relaxation (grade 1 diastolic   dysfunction). Doppler parameters are consistent with elevated   ventricular end-diastolic filling pressure. - Aortic valve: There was critical stenosis. There was mild   regurgitation. Valve area (VTI): 0.55 cm^2. Valve area (Vmean):   0.54 cm^2. - Mitral valve: Calcified annulus. Mildly thickened leaflets .   There was mild regurgitation. - Left atrium: The atrium was moderately dilated. - Right atrium: The atrium was moderately dilated. - Tricuspid valve: There was moderate regurgitation. - Pulmonary arteries: Systolic pressure was mildly increased. PA   peak pressure: 39 mm Hg (S). - Inferior vena cava: The vessel was normal in size. Impressions: - Critical aortic stenosis with peak/mean gradients 99/65 mmHg.   Preserved LVEF 60-65%.   Patient Profile      [name] is a 84 year old male with a history of CAD s/p CABG (1999), PAD s/p R CEA (2001) w/ 80-99% RICA occlusion now, hx of  CVA, s/p PPM, HTN, PAF on Eliquis, ulcerative colitis and severe AS who presented to [facility] on 04/11/18 for planned TAVR.   Assessment & Plan   Severe AS: during TAVR procedure, there was a vascular site complication that required right external iliac artery oversewing and emergent fem-fem bypass by Dr. Scot Dock. TAVR valve was not able to be deployed but he did get a BAV. Echocardiogram 04/13/18 show persistent critical aortic stenosis. Plan is for TAVR  next Tuesday from a transapical approach with Dr. Burt Knack and Dr. Cyndia Bent.  Fem-fem bypass: completed by Dr. Scot Dock. Preveena dressings in place with good seal. Doppler with good flow in both feet per vascular and nursing staff. Dressings to stay in place for 7-10 days  Acute on chronic diastolic CHF: he had some post op hypervolemia that is responding well to po lasix 37m BID. Weight trending down. Creat remains normal  Carotid artery disease: pre TAVR dopplers showed 80-99% RICA stenosis where he previously had R CEA. Evaluated by Dr. CBridgett Larssonprior to admission. Plan was for outpatient CT and follow up.   HTN: BP well controlled currently.    PAF: tele shows paced rhythm. Eliquis on hold given recent vascular surgery and TAVR via transapical approach next Tuesday  CAD s/p CABG x6V: pre TAVR cath showed patent 6/6 bypass grafts. Continue medical therapy.   Post operative anemia: Hg remains stable ~8. Continue to monitor  Tachycardia: HR somewhat improved with increased BB. No s/s infection. V paced, HR 90-110s? Underlying afib   Signed, KAngelena Form PA-C  04/16/2018, 8:36 AM  Pager [phone] I have seen and examined the patient and agree with the assessment and plan as outlined.  CRexene Alberts MD 04/16/2018 11:34 AM

## 2018-04-16 NOTE — Progress Notes (Addendum)
[facility]  Subjective  - Doing well from fem-fem by pass no new complaints.     Objective (!) 145/88 93 98.1 F (36.7 C) (Oral) (!) 22 97%  Intake/Output Summary (Last 24 hours) at 04/16/2018 0830 Last data filed at 04/16/2018 0503 Gross per 24 hour  Intake 1080 ml  Output 1225 ml  Net -145 ml    Doppler DP/PT B LE, feet warm and well perfused. Doppler fem-fem by pass brisk signal. Incisional wound vacs to suction 0 drainage Heart AV paced   Assessment/Planning: POD # 5 1.  Repair of right external iliac vein and oversewing of right external iliac artery (Dr. Roxy Manns) 2.  Endarterectomy of right common femoral artery and left to right femoral-femoral bypass with 8 mm Dacron graft (Dr. Scot Dock)  Patent bypass with doppler signals distally.  I will order ABI's B for baseline. Continue incisional vacs for 7-10 days Pending TAVR Tuesday    Roxy Horseman 04/16/2018 8:30 AM --  Laboratory Lab Results: Recent Labs    04/14/18 0335 04/15/18 0939  WBC 8.7 8.0  HGB 8.0* 8.1*  HCT 25.4* 26.1*  PLT 177 228   BMET Recent Labs    04/14/18 0335 04/15/18 0939  NA 139 137  K 4.1 4.1  CL 110 108  CO2 24 23  GLUCOSE 97 209*  BUN 24* 24*  CREATININE 1.04 1.15  CALCIUM 8.2* 8.6*    COAG Lab Results  Component Value Date   INR 1.38 04/11/2018   INR 1.70 04/11/2018   INR 1.07 04/07/2018   No results found for: PTT  I have seen and evaluated the patient. I agree with the PA note as documented above. Will continue incisional vacs until 7-10 days post-op.  Good signal in fem fem graft.  Marty Heck, MD [facility] Office: [phone] Pager: [phone]

## 2018-04-17 ENCOUNTER — Inpatient Hospital Stay (HOSPITAL_COMMUNITY): Payer: Medicare Other

## 2018-04-17 ENCOUNTER — Encounter (HOSPITAL_COMMUNITY): Payer: Self-pay | Admitting: General Practice

## 2018-04-17 ENCOUNTER — Other Ambulatory Visit: Payer: Self-pay

## 2018-04-17 LAB — PROTIME-INR
INR: 1.08
PROTHROMBIN TIME: 13.9 s (ref 11.4–15.2)

## 2018-04-17 LAB — PREPARE RBC (CROSSMATCH)

## 2018-04-17 MED ORDER — MAGNESIUM SULFATE 50 % IJ SOLN
40.0000 meq | INTRAMUSCULAR | Status: DC
  Filled 2018-04-17: qty 9.85

## 2018-04-17 MED ORDER — SODIUM CHLORIDE 0.9 % IV SOLN
INTRAVENOUS | Status: DC
  Filled 2018-04-17: qty 1

## 2018-04-17 MED ORDER — EPINEPHRINE PF 1 MG/ML IJ SOLN
0.0000 ug/min | INTRAVENOUS | Status: DC
  Filled 2018-04-17: qty 4

## 2018-04-17 MED ORDER — SODIUM CHLORIDE 0.9% IV SOLUTION: Freq: Once | INTRAVENOUS | Status: DC

## 2018-04-17 MED ORDER — POTASSIUM CHLORIDE 2 MEQ/ML IV SOLN
80.0000 meq | INTRAVENOUS | Status: DC
  Filled 2018-04-17: qty 40

## 2018-04-17 MED ORDER — NITROGLYCERIN IN D5W 200-5 MCG/ML-% IV SOLN
2.0000 ug/min | INTRAVENOUS | Status: DC
  Filled 2018-04-17: qty 250

## 2018-04-17 MED ORDER — DEXMEDETOMIDINE HCL IN NACL 400 MCG/100ML IV SOLN
0.1000 ug/kg/h | INTRAVENOUS | Status: DC
  Filled 2018-04-17: qty 100

## 2018-04-17 MED ORDER — CHLORHEXIDINE GLUCONATE 0.12 % MT SOLN
15.0000 mL | Freq: Once | OROMUCOSAL | Status: AC
  Administered 2018-04-18: 15 mL via OROMUCOSAL
  Filled 2018-04-17: qty 15

## 2018-04-17 MED ORDER — VANCOMYCIN HCL 10 G IV SOLR
1500.0000 mg | INTRAVENOUS | Status: AC
  Administered 2018-04-18: 1500 mg via INTRAVENOUS
  Filled 2018-04-17: qty 1500

## 2018-04-17 MED ORDER — DOPAMINE-DEXTROSE 3.2-5 MG/ML-% IV SOLN
0.0000 ug/kg/min | INTRAVENOUS | Status: DC
  Filled 2018-04-17: qty 250

## 2018-04-17 MED ORDER — CEFUROXIME SODIUM 1.5 G IV SOLR
1.5000 g | INTRAVENOUS | Status: AC
  Administered 2018-04-18: 1.5 g via INTRAVENOUS
  Filled 2018-04-17: qty 1.5

## 2018-04-17 MED ORDER — CHLORHEXIDINE GLUCONATE 4 % EX LIQD
1.0000 "application " | Freq: Once | CUTANEOUS | Status: DC
  Filled 2018-04-17: qty 120

## 2018-04-17 MED ORDER — SODIUM CHLORIDE 0.9 % IV SOLN
30.0000 ug/min | INTRAVENOUS | Status: DC
  Filled 2018-04-17: qty 2

## 2018-04-17 MED ORDER — TEMAZEPAM 7.5 MG PO CAPS: 15.0000 mg | ORAL_CAPSULE | Freq: Once | ORAL | Status: DC | PRN

## 2018-04-17 MED ORDER — NOREPINEPHRINE 4 MG/250ML-% IV SOLN
0.0000 ug/min | INTRAVENOUS | Status: DC
  Filled 2018-04-17: qty 250

## 2018-04-17 MED ORDER — SODIUM CHLORIDE 0.9 % IV SOLN
INTRAVENOUS | Status: DC
  Filled 2018-04-17: qty 30

## 2018-04-17 NOTE — Progress Notes (Signed)
[facility]  Subjective  - Doing well no complaints.   Objective (!) 150/61 85 97.9 F (36.6 C) (Oral) 20 92%  Intake/Output Summary (Last 24 hours) at 04/17/2018 0725 Last data filed at 04/17/2018 0209 Gross per 24 hour  Intake 960 ml  Output 1625 ml  Net -665 ml    Palpable graft pulse fem-fem Doppler Doppler DP/PT B LE Heart AV paced Lungs non labored breathing  Assessment/Planning: POD # 6   1. Repair of right external iliac vein and oversewing of right external iliac artery (Dr. Roxy Manns) 2.Endarterectomy of right common femoral artery and left to right femoral-femoral bypass with 8 mm Dacron graft (Dr. Scot Dock) Patent bypass Tomorrow will be day 7 of incisional vacs Pending TAVR tomorrow  Roxy Horseman 04/17/2018 7:25 AM --  Laboratory Lab Results: Recent Labs    04/15/18 0939  WBC 8.0  HGB 8.1*  HCT 26.1*  PLT 228   BMET Recent Labs    04/15/18 0939  NA 137  K 4.1  CL 108  CO2 23  GLUCOSE 209*  BUN 24*  CREATININE 1.15  CALCIUM 8.6*    COAG Lab Results  Component Value Date   INR 1.38 04/11/2018   INR 1.70 04/11/2018   INR 1.07 04/07/2018   No results found for: PTT

## 2018-04-17 NOTE — Progress Notes (Addendum)
[place] VALVE TEAM  Patient Name: Dalton Townsend Date of Encounter: 04/17/2018  Primary Cardiologist: Dr. Marlou Porch / Dr. Burt Knack & Dr. Roxy Manns (TAVR)  Hospital Problem List     Principal Problem:   Severe aortic stenosis Active Problems:   HTN (hypertension)   Ulcerative colitis ([place])   Acute on chronic congestive heart failure (HCC)   Presence of permanent cardiac pacemaker   PAF (paroxysmal atrial fibrillation) (HCC)   Carotid artery disease (HCC)   CAD (coronary artery disease)   BPH (benign prostatic hyperplasia)   GAD (generalized anxiety disorder)   Aortic stenosis     Subjective   No complaints. No SOB. Feeling well. Ready for surgery tomorrow. Son in Sports coach in room. All questions answered.   Inpatient Medications    Scheduled Meds: . amLODipine  5 mg Oral Daily  . atorvastatin  20 mg Oral QODAY  . balsalazide  2,250 mg Oral TID WC  . bisacodyl  10 mg Oral Daily   Or  . bisacodyl  10 mg Rectal Daily  . docusate sodium  200 mg Oral Daily  . enoxaparin (LOVENOX) injection  30 mg Subcutaneous Q24H  . escitalopram  10 mg Oral Daily  . finasteride  5 mg Oral Once per day on Sun Mon Tue Thu Fri Sat  . folic acid-pyridoxine-cyancobalamin  1 tablet Oral Daily  . furosemide  40 mg Oral BID  . iron polysaccharides  150 mg Oral Daily  . latanoprost  1 drop Both Eyes QHS  . [START ON 04/18/2018] magnesium sulfate  40 mEq Other To OR  . metoprolol tartrate  25 mg Oral BID  . mupirocin ointment   Nasal BID  . pantoprazole  40 mg Oral Daily  . potassium chloride  10 mEq Oral BID WC  . [START ON 04/18/2018] potassium chloride  80 mEq Other To OR  . sodium chloride flush  3 mL Intravenous Q12H   Continuous Infusions: . sodium chloride    . [START ON 04/18/2018] cefUROXime (ZINACEF)  IV    . [START ON 04/18/2018] dexmedetomidine    . [START ON 04/18/2018] DOPamine    . [START ON 04/18/2018] epinephrine    . [START ON 04/18/2018]  heparin 30,000 units/NS 1000 mL solution for CELLSAVER    . [START ON 04/18/2018] insulin (NOVOLIN-R) infusion    . [START ON 04/18/2018] nitroGLYCERIN    . [START ON 04/18/2018] norepinephrine (LEVOPHED) Adult infusion    . [START ON 04/18/2018] phenylephrine 67m/250mL NS (0.055mml) infusion    . [START ON 04/18/2018] vancomycin     PRN Meds: sodium chloride, fentaNYL (SUBLIMAZE) injection, metoprolol tartrate, morphine injection, ondansetron (ZOFRAN) IV, oxyCODONE, sodium chloride flush, traMADol   Vital Signs    Vitals:   04/16/18 1332 04/16/18 1808 04/17/18 0500 04/17/18 0900  BP: (!) 94/55 (!) 150/61  126/67  Pulse: 85   99  Resp: 20 20    Temp: 97.8 F (36.6 C) 97.9 F (36.6 C)  97.8 F (36.6 C)  TempSrc: Oral Oral  Oral  SpO2:    98%  Weight:   85.1 kg   Height:        Intake/Output Summary (Last 24 hours) at 04/17/2018 1002 Last data filed at 04/17/2018 0209 Gross per 24 hour  Intake 600 ml  Output 1325 ml  Net -725 ml   Filed Weights   04/15/18 0417 04/16/18 0500 04/17/18 0500  Weight: 88.7 kg 86.7 kg 85.1 kg  Physical Exam   GEN: Well nourished, well developed, in no acute distress.  HEENT: Grossly normal.  Neck: Supple, no JVD or masses. Cardiac:  RRR, + harsh systolic murmur. No rubs, or gallops. No clubbing, cyanosis, edema.  Radials/DP/PT 2+ and equal bilaterally.  Respiratory:  Respirations regular and unlabored, clear to auscultation bilaterally. GI: Soft, nontender, nondistended, BS + x 4. MS: no deformity or atrophy. Skin: warm and dry, no rash. Dressings intact. Neuro:  Strength and sensation are intact. Psych: AAOx3.  Normal affect.  Labs    CBC Recent Labs    04/15/18 0939  WBC 8.0  HGB 8.1*  HCT 26.1*  MCV 96.0  PLT 295   Basic Metabolic Panel Recent Labs    04/15/18 0939  NA 137  K 4.1  CL 108  CO2 23  GLUCOSE 209*  BUN 24*  CREATININE 1.15  CALCIUM 8.6*   Liver Function Tests No results for input(s): AST, ALT, ALKPHOS,  BILITOT, PROT, ALBUMIN in the last 72 hours. No results for input(s): LIPASE, AMYLASE in the last 72 hours. Cardiac Enzymes No results for input(s): CKTOTAL, CKMB, CKMBINDEX, TROPONINI in the last 72 hours. BNP Invalid input(s): POCBNP D-Dimer No results for input(s): DDIMER in the last 72 hours. Hemoglobin A1C No results for input(s): HGBA1C in the last 72 hours. Fasting Lipid Panel No results for input(s): CHOL, HDL, LDLCALC, TRIG, CHOLHDL, LDLDIRECT in the last 72 hours. Thyroid Function Tests No results for input(s): TSH, T4TOTAL, T3FREE, THYROIDAB in the last 72 hours.  Invalid input(s): FREET3  Telemetry    V paced, HR 90-110s, PVCs - Personally Reviewed  ECG    AV paced - Personally Reviewed  Radiology    No results found.  Cardiac Studies   TAVR OPERATIVE NOTE   Date of Procedure:                04/11/2018  Preoperative Diagnosis:      Severe Aortic Stenosis  Procedure:      ? Balloon aortic valvuloplasty              Co-Surgeons:                        Braulio Conte H. Roxy Manns, MD and Sherren Mocha, MD  Pre-operative Echo Findings: ? Severe aortic stenosis ? Normal left ventricular systolic function  _______________  2D ECHO 04/13/18: Study Conclusions - Left ventricle: The cavity size was normal. Systolic function was   normal. The estimated ejection fraction was in the range of 60%   to 65%. Wall motion was normal; there were no regional wall   motion abnormalities. Doppler parameters are consistent with   abnormal left ventricular relaxation (grade 1 diastolic   dysfunction). Doppler parameters are consistent with elevated   ventricular end-diastolic filling pressure. - Aortic valve: There was critical stenosis. There was mild   regurgitation. Valve area (VTI): 0.55 cm^2. Valve area (Vmean):   0.54 cm^2. - Mitral valve: Calcified annulus. Mildly thickened leaflets .   There was mild regurgitation. - Left atrium: The atrium was moderately  dilated. - Right atrium: The atrium was moderately dilated. - Tricuspid valve: There was moderate regurgitation. - Pulmonary arteries: Systolic pressure was mildly increased. PA   peak pressure: 39 mm Hg (S). - Inferior vena cava: The vessel was normal in size. Impressions: - Critical aortic stenosis with peak/mean gradients 99/65 mmHg.   Preserved LVEF 60-65%.   Patient Profile      Mr.  Tappan is a 84 year old male with a history of CAD s/p CABG (1999), PAD s/p R CEA (2001) w/ 80-99% RICA occlusion now, hx of CVA, s/p PPM, HTN, PAF on Eliquis, ulcerative colitis and severe AS who presented to [facility] on 04/11/18 for planned TAVR.   Assessment & Plan   Severe AS: during TAVR procedure, there was a vascular site complication that required right external iliac artery oversewing and emergent fem-fem bypass by Dr. Scot Dock. TAVR valve was not able to be deployed but he did get a BAV. Echocardiogram 04/13/18 show persistent critical aortic stenosis. Plan is for TAVR tomorrow from a transapical approach with Dr. Burt Knack and Dr. Cyndia Bent.  Fem-fem bypass: completed by Dr. Scot Dock. Preveena dressings in place with good seal. Doppler with good flow in both feet per vascular and nursing staff. Dressings will be removed today.   Acute on chronic diastolic CHF: he had some post op hypervolemia that is responding well to po lasix 59m BID. Weight trending down, closer to baseline ~ 184 lbs. Creat remains normal  Carotid artery disease: pre TAVR dopplers showed 80-99% RICA stenosis where he previously had R CEA. Evaluated by Dr. CBridgett Larssonprior to admission. Plan was for outpatient CT and follow up.   HTN: BP well controlled currently.    PAF: tele shows paced rhythm. Eliquis on hold given recent vascular surgery and TAVR via transapical approach tomorrow   CAD s/p CABG x6V: pre TAVR cath showed patent 6/6 bypass grafts. Continue medical therapy.   Post operative anemia: Hg remains stable ~8. Continue to  monitor  Tachycardia: improved.   Signed, KAngelena Form PA-C  04/17/2018, 10:02 AM  Pager [phone]  I have seen and examined the patient and agree with the assessment and plan as outlined.  For TAVR tomorrow via transapical approach by Drs BCyndia Bentand CBurt Knack  Will d/c Praveena.  CRexene Alberts MD 04/17/2018 10:15 AM

## 2018-04-17 NOTE — Progress Notes (Signed)
Removed Wound Vac per provider orders. Patient  Tolerated removal well. Left and Rt. Groin incisions are clean dry and intact. Covered both incisions with dry gauze. Will continue to monitor surgical incisions.

## 2018-04-17 NOTE — Progress Notes (Signed)
6 Days Post-Op Procedure(s) (LRB): INTRAOPERATIVE TRANSTHORACIC ECHOCARDIOGRAM LEFT TO RIGHT BYPASS GRAFT FEMORAL-FEMORAL ARTERY (N/A) ENDARTERECTOMY FEMORAL (Right) BALLOON AORTIC VALVE VALVULOPLASTY.  RIGHT EXTERNAL ILIAC ARTERY OVERSEW. (N/A) Subjective: No specific complaints. Ambulated some today.  Objective: Vital signs in last 24 hours: Temp:  [97.8 F (36.6 C)-97.9 F (36.6 C)] 97.8 F (36.6 C) (08/19 0900) Pulse Rate:  [99] 99 (08/19 0900) Cardiac Rhythm: Ventricular paced (08/19 0930) Resp:  [20] 20 (08/18 1808) BP: (126-150)/(61-67) 126/67 (08/19 0900) SpO2:  [98 %] 98 % (08/19 0900) Weight:  [85.1 kg] 85.1 kg (08/19 0500)  Hemodynamic parameters for last 24 hours:    Intake/Output from previous day: 08/18 0701 - 08/19 0700 In: 960 [P.O.:960] Out: 1625 [Urine:1625] Intake/Output this shift: No intake/output data recorded.  General appearance: alert and cooperative Heart: regular rate and rhythm and 3/6 systolic murmur RSB Lungs: clear to auscultation bilaterally Extremities: feet warm, palpable fem-fem pulse Wound: healing well  Lab Results: Recent Labs    04/15/18 0939  WBC 8.0  HGB 8.1*  HCT 26.1*  PLT 228   BMET:  Recent Labs    04/15/18 0939  NA 137  K 4.1  CL 108  CO2 23  GLUCOSE 209*  BUN 24*  CREATININE 1.15  CALCIUM 8.6*    PT/INR:  Recent Labs    04/17/18 1414  LABPROT 13.9  INR 1.08   ABG    Component Value Date/Time   PHART 7.313 (L) 04/11/2018 2001   HCO3 19.7 (L) 04/11/2018 2001   TCO2 24 04/12/2018 1558   ACIDBASEDEF 6.0 (H) 04/11/2018 2001   O2SAT 95.0 04/11/2018 2001   CBG (last 3)  No results for input(s): GLUCAP in the last 72 hours.  Assessment/Plan: S/P Procedure(s) (LRB): INTRAOPERATIVE TRANSTHORACIC ECHOCARDIOGRAM LEFT TO RIGHT BYPASS GRAFT FEMORAL-FEMORAL ARTERY (N/A) ENDARTERECTOMY FEMORAL (Right) BALLOON AORTIC VALVE VALVULOPLASTY.  RIGHT EXTERNAL ILIAC ARTERY OVERSEW. (N/A)  I have reviewed his  CTA studies again. Transfemoral insertion is out of the question. 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 He has had a previous right CEA and now has high grade right ICA restenosis and I would not use either carotid in that case.  I think trans-apical insertion is the best option. I discussed the operative procedure with the patient and his wife and daughter including alternatives, benefits and risks; including but not limited to bleeding, blood transfusion, infection, stroke, myocardial infarction, graft failure, organ dysfunction, and death.  Domenic Polite understands and agrees to proceed.  Plan trans-apical TAVR in am.   LOS: 6 days    Gaye Pollack 04/17/2018

## 2018-04-17 NOTE — Progress Notes (Signed)
CARDIAC REHAB PHASE I   PRE:  Rate/Rhythm: 90 paced  BP:  Supine:   Sitting: 129/69  Standing:    SaO2: 98%RA  MODE:  Ambulation: 230 ft   POST:  Rate/Rhythm: 104 paced  BP:  Supine:   Sitting: 152/73  Standing:    SaO2: 96%RA 1018-1050 Assisted pt to bathroom and helped him get cleaned up. Pt then walked 230 ft on RA with gait belt use, rolling walker and asst x 2. Wound vac intact. Pt to recliner after walk. Family in room. A little impulsive with actions when up. PT to see later.   Graylon Good, RN BSN  04/17/2018 10:46 AM

## 2018-04-17 NOTE — Progress Notes (Signed)
Physical Therapy Treatment Patient Details Name: Dalton Townsend MRN: [id number] DOB: 08/22/1923 Today's Date: 04/17/2018    History of Present Illness Pt adm on 04/11/18 for planned TAVR. During attempted TAVR on 8/13 pt with vascular complications requiring right external iliac artery oversewing and emergent fem-fem bypass. Post op pt with acute on chronic heart failure. Pt for TAVR on 8/20.  PMH of CAD s/p CABG, severe AS, SSS s/p PPM placed 7/11, HTN, HL, anemia, and CVA .    PT Comments    Pt continues to make steady progress. Pt to have TAVR tomorrow.    Follow Up Recommendations  Supervision for mobility/OOB;Home health PT     Equipment Recommendations  None recommended by PT    Recommendations for Other Services       Precautions / Restrictions Precautions Precautions: Fall Precaution Comments: pt reports 1 fall in past 2 years, it was 13 months ago following a 5 hour car drive Restrictions Weight Bearing Restrictions: No    Mobility  Bed Mobility               General bed mobility comments: Up in bathroom  Transfers Overall transfer level: Needs assistance Equipment used: Rolling walker (2 wheeled) Transfers: Sit to/from Stand Sit to Stand: Supervision         General transfer comment: supervision for safety  Ambulation/Gait Ambulation/Gait assistance: Min guard Gait Distance (Feet): 250 Feet Assistive device: Rolling walker (2 wheeled) Gait Pattern/deviations: Shuffle;Step-through pattern;Trunk flexed;Decreased step length - right;Decreased step length - left Gait velocity: decreased Gait velocity interpretation: 1.31 - 2.62 ft/sec, indicative of limited community ambulator General Gait Details: Verbal cues to stand more erect and look up   Stairs             Wheelchair Mobility    Modified Rankin (Stroke Patients Only)       Balance Overall balance assessment: Mild deficits observed, not formally tested                                           Cognition Arousal/Alertness: Awake/alert Behavior During Therapy: WFL for tasks assessed/performed Overall Cognitive Status: Within Functional Limits for tasks assessed                                        Exercises      General Comments General comments (skin integrity, edema, etc.): wife present      Pertinent Vitals/Pain Pain Assessment: No/denies pain    Home Living Family/patient expects to be discharged to:: Private residence Living Arrangements: Spouse/significant other                  Prior Function            PT Goals (current goals can now be found in the care plan section) Progress towards PT goals: Progressing toward goals    Frequency    Min 3X/week      PT Plan Discharge plan needs to be updated    Co-evaluation              AM-PAC PT "6 Clicks" Daily Activity  Outcome Measure  Difficulty turning over in bed (including adjusting bedclothes, sheets and blankets)?: A Little Difficulty moving from lying on back to sitting on the side of the bed? :  A Lot Difficulty sitting down on and standing up from a chair with arms (e.g., wheelchair, bedside commode, etc,.)?: A Little Help needed moving to and from a bed to chair (including a wheelchair)?: A Little Help needed walking in hospital room?: A Little Help needed climbing 3-5 steps with a railing? : A Little 6 Click Score: 17    End of Session   Activity Tolerance: Patient tolerated treatment well Patient left: with call bell/phone within reach;with family/visitor present;in chair Nurse Communication: Mobility status PT Visit Diagnosis: Muscle weakness (generalized) (M62.81);Other abnormalities of gait and mobility (R26.89)     Time: 1194-1740 PT Time Calculation (min) (ACUTE ONLY): 12 min  Charges:  $Gait Training: 8-22 mins                    [facility] PT [place] 04/17/2018, 5:15 PM

## 2018-04-17 NOTE — Progress Notes (Signed)
PT Cancellation Note  Patient Details Name: Dalton Townsend MRN: [id number] DOB: 23-Mar-1923   Cancelled Treatment:    Reason Eval/Treat Not Completed: Patient at procedure or test/unavailable   Shary Decamp [facility] 04/17/2018, 2:38 PM [facility] PT [phone]

## 2018-04-17 NOTE — Plan of Care (Signed)

## 2018-04-17 NOTE — Anesthesia Preprocedure Evaluation (Addendum)
Anesthesia Evaluation  Patient identified by MRN, date of birth, ID band Patient awake    Reviewed: Allergy & Precautions, NPO status , Patient's Chart, lab work & pertinent test results, reviewed documented beta blocker date and time   History of Anesthesia Complications Negative for: history of anesthetic complications  Airway Mallampati: II  TM Distance: >3 FB Neck ROM: Full    Dental  (+) Dental Advisory Given   Pulmonary former smoker,    breath sounds clear to auscultation       Cardiovascular hypertension, Pt. on medications and Pt. on home beta blockers + CAD, + CABG, + Peripheral Vascular Disease and +CHF  + dysrhythmias Atrial Fibrillation + pacemaker + Valvular Problems/Murmurs AS  Rhythm:Regular Rate:Normal + Systolic murmurs  '19 TTE - EF of 60% to 65%. Grade 1 diastolic   dysfunction. Critical AS and mild AI. Valve area (VTI): 0.55 cm^2. Valve area (Vmean): 0.54 cm^2. Mild MR. Moderately dilated LA and RA. Moderate TR. PASP was mildly increased, 39 mm Hg.   Neuro/Psych PSYCHIATRIC DISORDERS Anxiety negative neurological ROS     GI/Hepatic Neg liver ROS, PUD,  UC    Endo/Other  negative endocrine ROS  Renal/GU negative Renal ROS    BPH     Musculoskeletal negative musculoskeletal ROS (+)   Abdominal   Peds  Hematology negative hematology ROS (+)   Anesthesia Other Findings   Reproductive/Obstetrics                            Anesthesia Physical  Anesthesia Plan  ASA: IV  Anesthesia Plan: General   Post-op Pain Management:    Induction: Intravenous  PONV Risk Score and Plan: 2 and Ondansetron and Treatment may vary due to age or medical condition  Airway Management Planned: Oral ETT  Additional Equipment: Arterial line, CVP and Ultrasound Guidance Line Placement  Intra-op Plan:   Post-operative Plan: Possible Post-op intubation/ventilation  Informed  Consent: I have reviewed the patients History and Physical, chart, labs and discussed the procedure including the risks, benefits and alternatives for the proposed anesthesia with the patient or authorized representative who has indicated his/her understanding and acceptance.   Dental advisory given  Plan Discussed with: CRNA and Anesthesiologist  Anesthesia Plan Comments:        Anesthesia Quick Evaluation                                  Anesthesia Evaluation  Patient identified by MRN, date of birth, ID band Patient awake    Reviewed: Allergy & Precautions, NPO status , Patient's Chart, lab work & pertinent test results, reviewed documented beta blocker date and time   History of Anesthesia Complications Negative for: history of anesthetic complications  Airway Mallampati: II  TM Distance: >3 FB Neck ROM: Full    Dental  (+) Teeth Intact   Pulmonary former smoker,    breath sounds clear to auscultation       Cardiovascular hypertension, Pt. on medications and Pt. on home beta blockers + CAD, + Cardiac Stents, + CABG, + Peripheral Vascular Disease and +CHF  + dysrhythmias Atrial Fibrillation + pacemaker + Valvular Problems/Murmurs AS  Rhythm:Regular Rate:Bradycardia + Systolic murmurs    Neuro/Psych PSYCHIATRIC DISORDERS Anxiety    GI/Hepatic PUD,   Endo/Other  negative endocrine ROS  Renal/GU      Musculoskeletal   Abdominal   Peds  Hematology  (+) anemia ,   Anesthesia Other Findings 04/05/18:  Mid LM to Ost LAD lesion is 100% stenosed.  Ost Cx to Prox Cx lesion is 100% stenosed.  LIMA graft was visualized by angiography and is normal in caliber.  SVG graft was visualized by angiography and is normal in caliber.  SVG graft was visualized by angiography and is normal in caliber.  Prox RCA lesion is 50% stenosed.  Dist RCA lesion is 100% stenosed.  SVG graft was visualized by angiography and is normal in caliber.  Hemodynamic  findings consistent with mild pulmonary hypertension.   1. Severe triple vessel CAD s/p 6VCABG with 6 patent grafts.  2. Chronic occlusion proximal LAD and proximal Circumflex, mid to distal RCA 3. Severe aortic stenosis by echo. Unable to advance catheters into the LV due to tortuosity in the left subclavian artery. Coronary angiography was complete and I did not think it would be worthwhile to gain femoral artery access just to cross his valve. No LV pressures or aortic valve data obtained today by cath.   04/03/18: Left ventricle: Abnormal septal motion inferior basal hypokinesis   The cavity size was mildly dilated. Wall thickness was increased   in a pattern of moderate LVH. Systolic function was normal. The   estimated ejection fraction was in the range of 50% to 55%. Left   ventricular diastolic function parameters were normal. - Aortic valve: There was critical stenosis. There was mild   regurgitation. Valve area (VTI): 0.74 cm^2. Valve area (Vmax):   0.82 cm^2. Valve area (Vmean): 0.73 cm^2. - Mitral valve: Calcified annulus. Mildly thickened leaflets .   There was mild regurgitation. - Left atrium: The atrium was moderately dilated. - Right atrium: The atrium was mildly dilated. - Atrial septum: No defect or patent foramen ovale was identified.  Reproductive/Obstetrics                             Anesthesia Physical Anesthesia Plan  ASA: IV  Anesthesia Plan: MAC   Post-op Pain Management:    Induction: Intravenous  PONV Risk Score and Plan: 1 and Ondansetron and Treatment may vary due to age or medical condition  Airway Management Planned: Nasal Cannula  Additional Equipment: Arterial line, CVP and Ultrasound Guidance Line Placement  Intra-op Plan:   Post-operative Plan:   Informed Consent: I have reviewed the patients History and Physical, chart, labs and discussed the procedure including the risks, benefits and alternatives for the proposed  anesthesia with the patient or authorized representative who has indicated his/her understanding and acceptance.   Dental advisory given  Plan Discussed with: CRNA and Surgeon  Anesthesia Plan Comments:         Anesthesia Quick Evaluation

## 2018-04-18 ENCOUNTER — Inpatient Hospital Stay (HOSPITAL_COMMUNITY)
Admission: RE | Disposition: A | Discharge status: Home Health | Payer: Medicare Other | Source: Home / Self Care | Attending: Thoracic Surgery (Cardiothoracic Vascular Surgery)

## 2018-04-18 ENCOUNTER — Inpatient Hospital Stay (HOSPITAL_COMMUNITY): Payer: Medicare Other

## 2018-04-18 ENCOUNTER — Inpatient Hospital Stay (HOSPITAL_COMMUNITY): Payer: Medicare Other | Admitting: Anesthesiology

## 2018-04-18 ENCOUNTER — Encounter: Payer: Self-pay | Admitting: Thoracic Surgery (Cardiothoracic Vascular Surgery)

## 2018-04-18 DIAGNOSIS — Z006 Encounter for examination for normal comparison and control in clinical research program: Secondary | ICD-10-CM

## 2018-04-18 DIAGNOSIS — I350 Nonrheumatic aortic (valve) stenosis: Secondary | ICD-10-CM

## 2018-04-18 HISTORY — PX: TRANSCATHETER AORTIC VALVE REPLACEMENT, TRANSAPICAL: SHX6401

## 2018-04-18 HISTORY — PX: TEE WITHOUT CARDIOVERSION: SHX5443

## 2018-04-18 LAB — POCT I-STAT 4, (NA,K, GLUC, HGB,HCT)
Glucose, Bld: 141 mg/dL — ABNORMAL HIGH (ref 70–99)
HEMATOCRIT: 24 % — AB (ref 39.0–52.0)
Hemoglobin: 8.2 g/dL — ABNORMAL LOW (ref 13.0–17.0)
POTASSIUM: 4.3 mmol/L (ref 3.5–5.1)
SODIUM: 137 mmol/L (ref 135–145)

## 2018-04-18 LAB — CBC
HEMATOCRIT: 24.7 % — AB (ref 39.0–52.0)
HEMATOCRIT: 26.2 % — AB (ref 39.0–52.0)
HEMOGLOBIN: 8 g/dL — AB (ref 13.0–17.0)
Hemoglobin: 7.7 g/dL — ABNORMAL LOW (ref 13.0–17.0)
MCH: 30.2 pg (ref 26.0–34.0)
MCH: 29.6 pg (ref 26.0–34.0)
MCHC: 31.2 g/dL (ref 30.0–36.0)
MCHC: 30.5 g/dL (ref 30.0–36.0)
MCV: 96.9 fL (ref 78.0–100.0)
MCV: 97 fL (ref 78.0–100.0)
Platelets: 242 10*3/uL (ref 150–400)
Platelets: 211 10*3/uL (ref 150–400)
RBC: 2.55 MIL/uL — ABNORMAL LOW (ref 4.22–5.81)
RBC: 2.7 MIL/uL — ABNORMAL LOW (ref 4.22–5.81)
RDW: 15 % (ref 11.5–15.5)
RDW: 15.1 % (ref 11.5–15.5)
WBC: 7.2 10*3/uL (ref 4.0–10.5)
WBC: 10.2 10*3/uL (ref 4.0–10.5)

## 2018-04-18 LAB — POCT I-STAT 3, ART BLOOD GAS (G3+)
Acid-base deficit: 1 mmol/L (ref 0.0–2.0)
Bicarbonate: 23.6 mmol/L (ref 20.0–28.0)
O2 SAT: 99 %
PCO2 ART: 37.4 mmHg (ref 32.0–48.0)
PO2 ART: 118 mmHg — AB (ref 83.0–108.0)
Patient temperature: 96.2
TCO2: 25 mmol/L (ref 22–32)
pH, Arterial: 7.402 (ref 7.350–7.450)

## 2018-04-18 LAB — BASIC METABOLIC PANEL
Anion gap: 9 (ref 5–15)
BUN: 22 mg/dL (ref 8–23)
CALCIUM: 8.9 mg/dL (ref 8.9–10.3)
CO2: 25 mmol/L (ref 22–32)
CREATININE: 1.13 mg/dL (ref 0.61–1.24)
Chloride: 104 mmol/L (ref 98–111)
GFR calc non Af Amer: 54 mL/min — ABNORMAL LOW (ref >60)
Glucose, Bld: 117 mg/dL — ABNORMAL HIGH (ref 70–99)
Potassium: 4.1 mmol/L (ref 3.5–5.1)
Sodium: 138 mmol/L (ref 135–145)

## 2018-04-18 LAB — APTT: APTT: 41 s — AB (ref 24–36)

## 2018-04-18 LAB — PREPARE RBC (CROSSMATCH)

## 2018-04-18 SURGERY — REPLACEMENT, AORTIC VALVE, TRANSAPICAL APPROACH
Anesthesia: General | Site: Chest

## 2018-04-18 MED ORDER — ROCURONIUM BROMIDE 50 MG/5ML IV SOSY
PREFILLED_SYRINGE | INTRAVENOUS | Status: AC
  Filled 2018-04-18: qty 10

## 2018-04-18 MED ORDER — OXYCODONE HCL 5 MG PO TABS: 5.0000 mg | ORAL_TABLET | ORAL | Status: DC | PRN

## 2018-04-18 MED ORDER — PROTAMINE SULFATE 10 MG/ML IV SOLN
INTRAVENOUS | Status: DC | PRN
  Administered 2018-04-18 (×2): 30 mg via INTRAVENOUS
  Administered 2018-04-18: 10 mg via INTRAVENOUS
  Administered 2018-04-18 (×2): 30 mg via INTRAVENOUS

## 2018-04-18 MED ORDER — ONDANSETRON HCL 4 MG/2ML IJ SOLN
INTRAMUSCULAR | Status: AC
  Filled 2018-04-18: qty 2

## 2018-04-18 MED ORDER — HEPARIN SODIUM (PORCINE) 1000 UNIT/ML IJ SOLN
INTRAMUSCULAR | Status: AC
  Filled 2018-04-18: qty 1

## 2018-04-18 MED ORDER — SODIUM CHLORIDE 0.9% FLUSH
10.0000 mL | Freq: Two times a day (BID) | INTRAVENOUS | Status: DC
  Administered 2018-04-19: 10 mL
  Administered 2018-04-20: 20 mL
  Administered 2018-04-20: 10 mL
  Administered 2018-04-21: 20 mL
  Administered 2018-04-22: 10 mL

## 2018-04-18 MED ORDER — ASPIRIN 81 MG PO CHEW
81.0000 mg | CHEWABLE_TABLET | Freq: Every day | ORAL | Status: DC
  Administered 2018-04-19 – 2018-04-22 (×4): 81 mg via ORAL
  Filled 2018-04-18 (×4): qty 1

## 2018-04-18 MED ORDER — SODIUM CHLORIDE 0.9 % IV SOLN
INTRAVENOUS | Status: DC | PRN
  Administered 2018-04-18: 1500 mL

## 2018-04-18 MED ORDER — ONDANSETRON HCL 4 MG/2ML IJ SOLN: 4.0000 mg | Freq: Four times a day (QID) | INTRAMUSCULAR | Status: DC | PRN

## 2018-04-18 MED ORDER — ACETAMINOPHEN 325 MG PO TABS
650.0000 mg | ORAL_TABLET | Freq: Four times a day (QID) | ORAL | Status: DC | PRN
  Administered 2018-04-19 – 2018-04-21 (×6): 650 mg via ORAL
  Filled 2018-04-18 (×6): qty 2

## 2018-04-18 MED ORDER — CHLORHEXIDINE GLUCONATE CLOTH 2 % EX PADS
6.0000 | MEDICATED_PAD | Freq: Every day | CUTANEOUS | Status: DC
  Administered 2018-04-18 – 2018-04-22 (×4): 6 via TOPICAL

## 2018-04-18 MED ORDER — SODIUM CHLORIDE 0.9 % IV SOLN
INTRAVENOUS | Status: DC | PRN
  Administered 2018-04-18: 40 ug/min via INTRAVENOUS

## 2018-04-18 MED ORDER — ROCURONIUM BROMIDE 100 MG/10ML IV SOLN
INTRAVENOUS | Status: DC | PRN
  Administered 2018-04-18: 50 mg via INTRAVENOUS
  Administered 2018-04-18 (×2): 25 mg via INTRAVENOUS

## 2018-04-18 MED ORDER — ACETAMINOPHEN 650 MG RE SUPP: 650.0000 mg | Freq: Four times a day (QID) | RECTAL | Status: DC | PRN

## 2018-04-18 MED ORDER — SUGAMMADEX SODIUM 500 MG/5ML IV SOLN
INTRAVENOUS | Status: AC
  Filled 2018-04-18: qty 5

## 2018-04-18 MED ORDER — DEXAMETHASONE SODIUM PHOSPHATE 10 MG/ML IJ SOLN
INTRAMUSCULAR | Status: AC
  Filled 2018-04-18: qty 1

## 2018-04-18 MED ORDER — SODIUM CHLORIDE 0.9% FLUSH: 10.0000 mL | INTRAVENOUS | Status: DC | PRN

## 2018-04-18 MED ORDER — SODIUM CHLORIDE 0.9 % IV SOLN: 250.0000 mL | INTRAVENOUS | Status: DC | PRN

## 2018-04-18 MED ORDER — MORPHINE SULFATE (PF) 2 MG/ML IV SOLN
2.0000 mg | INTRAVENOUS | Status: DC | PRN
  Administered 2018-04-18 – 2018-04-19 (×3): 2 mg via INTRAVENOUS
  Filled 2018-04-18 (×3): qty 1

## 2018-04-18 MED ORDER — PROPOFOL 10 MG/ML IV BOLUS
INTRAVENOUS | Status: AC
  Filled 2018-04-18: qty 20

## 2018-04-18 MED ORDER — IODIXANOL 320 MG/ML IV SOLN
INTRAVENOUS | Status: DC | PRN
  Administered 2018-04-18: 40 mL via INTRAVENOUS

## 2018-04-18 MED ORDER — PHENYLEPHRINE HCL 10 MG/ML IJ SOLN
INTRAMUSCULAR | Status: DC | PRN
  Administered 2018-04-18: 40 ug via INTRAVENOUS

## 2018-04-18 MED ORDER — SODIUM CHLORIDE 0.9 % IV SOLN
1.5000 g | Freq: Two times a day (BID) | INTRAVENOUS | Status: AC
  Administered 2018-04-18 – 2018-04-20 (×4): 1.5 g via INTRAVENOUS
  Filled 2018-04-18 (×4): qty 1.5

## 2018-04-18 MED ORDER — HEPARIN SODIUM (PORCINE) 1000 UNIT/ML IJ SOLN
INTRAMUSCULAR | Status: DC | PRN
  Administered 2018-04-18: 13000 [IU] via INTRAVENOUS

## 2018-04-18 MED ORDER — DEXMEDETOMIDINE HCL IN NACL 400 MCG/100ML IV SOLN
INTRAVENOUS | Status: DC | PRN
  Administered 2018-04-18: .5 ug/kg/h via INTRAVENOUS

## 2018-04-18 MED ORDER — PHENYLEPHRINE HCL-NACL 20-0.9 MG/250ML-% IV SOLN
0.0000 ug/min | INTRAVENOUS | Status: DC
  Administered 2018-04-18: 5 ug/min via INTRAVENOUS
  Filled 2018-04-18: qty 250

## 2018-04-18 MED ORDER — ONDANSETRON HCL 4 MG/2ML IJ SOLN
INTRAMUSCULAR | Status: DC | PRN
  Administered 2018-04-18: 4 mg via INTRAVENOUS

## 2018-04-18 MED ORDER — SODIUM CHLORIDE 0.9% IV SOLUTION: Freq: Once | INTRAVENOUS | Status: DC

## 2018-04-18 MED ORDER — PHENYLEPHRINE 40 MCG/ML (10ML) SYRINGE FOR IV PUSH (FOR BLOOD PRESSURE SUPPORT)
PREFILLED_SYRINGE | INTRAVENOUS | Status: AC
  Filled 2018-04-18: qty 10

## 2018-04-18 MED ORDER — SUGAMMADEX SODIUM 500 MG/5ML IV SOLN
INTRAVENOUS | Status: DC | PRN
  Administered 2018-04-18: 300 mg via INTRAVENOUS

## 2018-04-18 MED ORDER — SODIUM CHLORIDE 0.9% FLUSH: 3.0000 mL | INTRAVENOUS | Status: DC | PRN

## 2018-04-18 MED ORDER — DEXAMETHASONE SODIUM PHOSPHATE 10 MG/ML IJ SOLN
INTRAMUSCULAR | Status: DC | PRN
  Administered 2018-04-18: 10 mg via INTRAVENOUS

## 2018-04-18 MED ORDER — SODIUM CHLORIDE 0.9% FLUSH
3.0000 mL | Freq: Two times a day (BID) | INTRAVENOUS | Status: DC
  Administered 2018-04-19 – 2018-04-22 (×5): 3 mL via INTRAVENOUS

## 2018-04-18 MED ORDER — SODIUM CHLORIDE 0.9 % IV SOLN
INTRAVENOUS | Status: AC
  Administered 2018-04-18: 11:00:00 via INTRAVENOUS

## 2018-04-18 MED ORDER — PROPOFOL 10 MG/ML IV BOLUS
INTRAVENOUS | Status: DC | PRN
  Administered 2018-04-18: 80 mg via INTRAVENOUS

## 2018-04-18 MED ORDER — FENTANYL CITRATE (PF) 250 MCG/5ML IJ SOLN
INTRAMUSCULAR | Status: AC
  Filled 2018-04-18: qty 5

## 2018-04-18 MED ORDER — METOPROLOL TARTRATE 5 MG/5ML IV SOLN: 2.5000 mg | INTRAVENOUS | Status: DC | PRN

## 2018-04-18 MED ORDER — VANCOMYCIN HCL IN DEXTROSE 1-5 GM/200ML-% IV SOLN
1000.0000 mg | Freq: Once | INTRAVENOUS | Status: AC
  Administered 2018-04-18: 1000 mg via INTRAVENOUS
  Filled 2018-04-18: qty 200

## 2018-04-18 MED ORDER — FENTANYL CITRATE (PF) 250 MCG/5ML IJ SOLN
INTRAMUSCULAR | Status: DC | PRN
  Administered 2018-04-18 (×2): 50 ug via INTRAVENOUS

## 2018-04-18 MED ORDER — LACTATED RINGERS IV SOLN
INTRAVENOUS | Status: DC | PRN
  Administered 2018-04-18 (×3): via INTRAVENOUS

## 2018-04-18 MED ORDER — TRAMADOL HCL 50 MG PO TABS
50.0000 mg | ORAL_TABLET | ORAL | Status: DC | PRN
  Administered 2018-04-18 (×3): 100 mg via ORAL
  Administered 2018-04-19: 50 mg via ORAL
  Filled 2018-04-18: qty 1
  Filled 2018-04-18 (×3): qty 2

## 2018-04-18 MED ORDER — NITROGLYCERIN IN D5W 200-5 MCG/ML-% IV SOLN: 0.0000 ug/min | INTRAVENOUS | Status: DC

## 2018-04-18 MED ORDER — SODIUM CHLORIDE 0.9 % IV SOLN
INTRAVENOUS | Status: AC
  Filled 2018-04-18 (×3): qty 1.2

## 2018-04-18 SURGICAL SUPPLY — 115 items
ADAPTER UNIV SWAN GANZ BIP (ADAPTER) ×2 IMPLANT
ADAPTER UNV SWAN GANZ BIP (ADAPTER) ×2
ADH SKN CLS APL DERMABOND .7 (GAUZE/BANDAGES/DRESSINGS) ×2
ADPR CATH UNV NS SG CATH (ADAPTER) ×2
ANTEGRADE CPLG (MISCELLANEOUS) IMPLANT
BAG DECANTER FOR FLEXI CONT (MISCELLANEOUS) ×4 IMPLANT
BAG SNAP BAND KOVER 36X36 (MISCELLANEOUS) ×6 IMPLANT
BLADE CLIPPER SURG (BLADE) ×4 IMPLANT
BLADE OSCILLATING /SAGITTAL (BLADE) IMPLANT
BLADE STERNUM SYSTEM 6 (BLADE) ×4 IMPLANT
CABLE ADAPT CONN TEMP 6FT (ADAPTER) ×7 IMPLANT
CANNULA FEM VENOUS REMOTE 22FR (CANNULA) IMPLANT
CANNULA OPTISITE PERFUSION 16F (CANNULA) IMPLANT
CANNULA OPTISITE PERFUSION 18F (CANNULA) IMPLANT
CANNULA SUMP PERICARDIAL (CANNULA) ×4 IMPLANT
CATH CROSS OVER TEMPO 5F (CATHETERS) ×4 IMPLANT
CATH DIAG EXPO 6F FR4 (CATHETERS) ×2 IMPLANT
CATH DIAG EXPO 6F VENT PIG 145 (CATHETERS) ×4 IMPLANT
CATH S G BIP PACING (SET/KITS/TRAYS/PACK) ×6 IMPLANT
CLIP VESOCCLUDE MED 6/CT (CLIP) ×4 IMPLANT
CLIP VESOCCLUDE SM WIDE 6/CT (CLIP) ×4 IMPLANT
CONN ST 1/4X3/8  BEN (MISCELLANEOUS) ×4
CONN ST 1/4X3/8 BEN (MISCELLANEOUS) ×2 IMPLANT
CONT SPECI 4OZ STER CLIK (MISCELLANEOUS) ×4 IMPLANT
COVER BACK TABLE 24X17X13 BIG (DRAPES) ×4 IMPLANT
COVER DOME SNAP 22 D (MISCELLANEOUS) ×4 IMPLANT
CRADLE DONUT ADULT HEAD (MISCELLANEOUS) ×4 IMPLANT
DERMABOND ADVANCED (GAUZE/BANDAGES/DRESSINGS) ×2
DERMABOND ADVANCED .7 DNX12 (GAUZE/BANDAGES/DRESSINGS) ×2 IMPLANT
DRAIN CHANNEL 28F RND 3/8 FF (WOUND CARE) ×4 IMPLANT
DRAPE HALF SHEET 40X57 (DRAPES) ×2 IMPLANT
DRAPE INCISE IOBAN 66X45 STRL (DRAPES) IMPLANT
DRAPE SLUSH/WARMER DISC (DRAPES) ×4 IMPLANT
DRAPE SURG IRRIG POUCH 19X23 (DRAPES) ×6 IMPLANT
DRAPE TABLE BACK 80X90 (DRAPES) ×2 IMPLANT
ELECT BLADE 6.5 EXT (BLADE) IMPLANT
ELECT CAUTERY BLADE 6.4 (BLADE) ×4 IMPLANT
ELECT REM PT RETURN 9FT ADLT (ELECTROSURGICAL) ×8
ELECTRODE REM PT RTRN 9FT ADLT (ELECTROSURGICAL) ×4 IMPLANT
FELT TEFLON 1X6 (MISCELLANEOUS) ×4 IMPLANT
FELT TEFLON 6X6 (MISCELLANEOUS) ×4 IMPLANT
GAUZE SPONGE 4X4 12PLY STRL (GAUZE/BANDAGES/DRESSINGS) ×4 IMPLANT
GLIDESHEATH SLEND SS 6F .021 (SHEATH) ×2 IMPLANT
GLOVE BIO SURGEON STRL SZ7.5 (GLOVE) ×8 IMPLANT
GLOVE BIO SURGEON STRL SZ8 (GLOVE) ×10 IMPLANT
GLOVE EUDERMIC 7 POWDERFREE (GLOVE) ×10 IMPLANT
GLOVE ORTHO TXT STRL SZ7.5 (GLOVE) ×8 IMPLANT
GOWN STRL REUS W/ TWL LRG LVL3 (GOWN DISPOSABLE) ×6 IMPLANT
GOWN STRL REUS W/ TWL XL LVL3 (GOWN DISPOSABLE) ×12 IMPLANT
GOWN STRL REUS W/TWL LRG LVL3 (GOWN DISPOSABLE) ×12
GOWN STRL REUS W/TWL XL LVL3 (GOWN DISPOSABLE) ×24
GUIDEWIRE INQWIRE 1.5J.035X260 (WIRE) IMPLANT
GUIDEWIRE SAF TJ AMPL .035X180 (WIRE) ×6 IMPLANT
GUIDEWIRE STRAIGHT .035 260CM (WIRE) ×4 IMPLANT
GUIDEWIRE WHOLEY .035 145 JTIP (WIRE) ×4 IMPLANT
HEMOSTAT POWDER SURGIFOAM 1G (HEMOSTASIS) IMPLANT
INQWIRE 1.5J .035X260CM (WIRE) ×4
INSERT FOGARTY SM (MISCELLANEOUS) ×4 IMPLANT
KIT DILATOR VASC 18G NDL (KITS) IMPLANT
KIT HEART LEFT (KITS) ×6 IMPLANT
KIT SUCTION CATH 14FR (SUCTIONS) ×8 IMPLANT
KIT TURNOVER KIT B (KITS) ×4 IMPLANT
LEAD PACING MYOCARDI (MISCELLANEOUS) ×4 IMPLANT
LOOP VESSEL MAXI BLUE (MISCELLANEOUS) IMPLANT
LOOP VESSEL MINI RED (MISCELLANEOUS) IMPLANT
NDL PERC 18GX7CM (NEEDLE) ×2 IMPLANT
NEEDLE 22X1 1/2 (OR ONLY) (NEEDLE) ×4 IMPLANT
NEEDLE PERC 18GX7CM (NEEDLE) ×4 IMPLANT
NS IRRIG 1000ML POUR BTL (IV SOLUTION) ×20 IMPLANT
PACK ENDOVASCULAR (PACKS) ×4 IMPLANT
PAD ARMBOARD 7.5X6 YLW CONV (MISCELLANEOUS) ×8 IMPLANT
PAD ELECT DEFIB RADIOL ZOLL (MISCELLANEOUS) ×4 IMPLANT
PENCIL BUTTON HOLSTER BLD 10FT (ELECTRODE) ×4 IMPLANT
RETRACTOR TRL SOFT TISSUE LG (INSTRUMENTS) IMPLANT
RETRACTOR TRM SOFT TISSUE 7.5 (INSTRUMENTS) IMPLANT
SHEATH AVANTI 11CM 8FR (SHEATH) ×4 IMPLANT
SHEATH BRITE TIP 6FR 35CM (SHEATH) ×2 IMPLANT
SHEATH PINNACLE 6F 10CM (SHEATH) ×5 IMPLANT
SLEEVE REPOSITIONING LENGTH 30 (MISCELLANEOUS) ×4 IMPLANT
SPONGE LAP 4X18 RFD (DISPOSABLE) ×4 IMPLANT
STOPCOCK MORSE 400PSI 3WAY (MISCELLANEOUS) ×5 IMPLANT
SUT BONE WAX W31G (SUTURE) ×4 IMPLANT
SUT ETHIBOND X763 2 0 SH 1 (SUTURE) ×8 IMPLANT
SUT PROLENE 2 0 MH 48 (SUTURE) ×10 IMPLANT
SUT PROLENE 4 0 RB 1 (SUTURE) ×4
SUT PROLENE 4-0 RB1 .5 CRCL 36 (SUTURE) ×2 IMPLANT
SUT SILK  1 MH (SUTURE) ×4
SUT SILK 1 MH (SUTURE) ×3 IMPLANT
SUT SILK 2 0 SH CR/8 (SUTURE) ×4 IMPLANT
SUT VIC AB 1 CTX 36 (SUTURE) ×4
SUT VIC AB 1 CTX36XBRD ANBCTR (SUTURE) ×2 IMPLANT
SUT VIC AB 2-0 CTX 36 (SUTURE) ×5 IMPLANT
SUT VIC AB 3-0 X1 27 (SUTURE) ×10 IMPLANT
SUT VICRYL 2 TP 1 (SUTURE) IMPLANT
SYR 3ML LL SCALE MARK (SYRINGE) ×7 IMPLANT
SYR 50ML LL SCALE MARK (SYRINGE) ×3 IMPLANT
SYR 5ML LL (SYRINGE) ×4 IMPLANT
SYR BULB IRRIGATION 50ML (SYRINGE) ×3 IMPLANT
SYRINGE 60CC LL (MISCELLANEOUS) ×2 IMPLANT
SYSTEM SAHARA CHEST DRAIN ATS (WOUND CARE) ×8 IMPLANT
TAPE CLOTH SURG 4X10 WHT LF (GAUZE/BANDAGES/DRESSINGS) ×2 IMPLANT
TOWEL GREEN STERILE (TOWEL DISPOSABLE) ×4 IMPLANT
TOWEL GREEN STERILE FF (TOWEL DISPOSABLE) ×4 IMPLANT
TOWEL OR NON WOVEN STRL DISP B (DISPOSABLE) ×4 IMPLANT
TRANSDUCER DISP STR W/STOPCOCK (MISCELLANEOUS) ×6 IMPLANT
TRANSDUCER W/STOPCOCK (MISCELLANEOUS) ×8 IMPLANT
TRAY FOLEY SLVR 16FR TEMP STAT (SET/KITS/TRAYS/PACK) ×4 IMPLANT
TUBING ART PRESS 72  MALE/FEM (TUBING) ×4
TUBING ART PRESS 72 MALE/FEM (TUBING) IMPLANT
TUBING HIGH PRESSURE 120CM (CONNECTOR) ×6 IMPLANT
VALVE HRT TRANSCATH CERT 26MM (Valve) ×2 IMPLANT
WIRE BENTSON .035X145CM (WIRE) ×4 IMPLANT
WIRE EMERALD 3MM-J .035X260CM (WIRE) ×2 IMPLANT
WIRE HI TORQ VERSACORE J 260CM (WIRE) ×3 IMPLANT
YANKAUER SUCT BULB TIP NO VENT (SUCTIONS) ×7 IMPLANT

## 2018-04-18 NOTE — OR Nursing (Addendum)
TR Band placed by R.Cox. 14cc of air placed in band at 1000.    Leatha Gilding, RN

## 2018-04-18 NOTE — Progress Notes (Signed)
  Echocardiogram Echocardiogram Transesophageal has been performed.  Dalton Townsend 04/18/2018, 10:07 AM

## 2018-04-18 NOTE — Progress Notes (Signed)
Patient ID: BENJIMAN SEDGWICK, male   DOB: 01/27/1923, 84 y.o.   MRN: [id number] EVENING ROUNDS NOTE :     [address]       [place] [address]             [phone]                 Day of Surgery Procedure(s) (LRB): TRANSCATHETER AORTIC VALVE REPLACEMENT, TRANSAPICAL. 56mm EDWARDS SAPIEN 3 TRANSCATHETER HEART VALVE. (N/A) TRANSESOPHAGEAL ECHOCARDIOGRAM (TEE) (N/A)  Total Length of Stay:  LOS: 7 days  BP (!) 110/50   Pulse 87   Temp 98.8 F (37.1 C)   Resp 19   Ht 5\' 11"  (1.803 m)   Wt 86.3 kg   SpO2 97%   BMI 26.54 kg/m   .Intake/Output      08/19 0701 - 08/20 0700 08/20 0701 - 08/21 0700   P.O.  120   I.V. (mL/kg)  2026.9 (23.5)   Blood  315   IV Piggyback  350   Total Intake(mL/kg)  2811.9 (32.6)   Urine (mL/kg/hr)  1335 (1.3)   Blood  50   Chest Tube  100   Total Output  1485   Net  +1326.9          . sodium chloride 50 mL/hr at 04/18/18 1800  . sodium chloride    . cefUROXime (ZINACEF)  IV    . nitroGLYCERIN    . phenylephrine (NEO-SYNEPHRINE) Adult infusion Stopped (04/18/18 1220)  . vancomycin       Lab Results  Component Value Date   WBC 10.2 04/18/2018   HGB 8.0 (L) 04/18/2018   HCT 26.2 (L) 04/18/2018   PLT 211 04/18/2018   GLUCOSE 141 (H) 04/18/2018   ALT 39 05/24/2010   AST 30 05/24/2010   NA 137 04/18/2018   K 4.3 04/18/2018   CL 104 04/18/2018   CREATININE 1.13 04/18/2018   BUN 22 04/18/2018   CO2 25 04/18/2018   INR 1.08 04/17/2018   HGBA1C 5.4 04/07/2018   Awake, neuro intact, paced, minimal ct drainage    Grace Isaac MD  [facility] [phone] Office [phone] 04/18/2018 6:28 PM

## 2018-04-18 NOTE — Anesthesia Postprocedure Evaluation (Signed)
Anesthesia Post Note  Patient: Dalton Townsend  Procedure(s) Performed: TRANSCATHETER AORTIC VALVE REPLACEMENT, TRANSAPICAL. 19m EDWARDS SAPIEN 3 TRANSCATHETER HEART VALVE. (N/A Chest) TRANSESOPHAGEAL ECHOCARDIOGRAM (TEE) (N/A )     Patient location during evaluation: ICU Anesthesia Type: General Level of consciousness: awake and alert Pain management: satisfactory to patient Vital Signs Assessment: post-procedure vital signs reviewed and stable Respiratory status: spontaneous breathing, nonlabored ventilation, respiratory function stable and patient connected to nasal cannula oxygen Cardiovascular status: blood pressure returned to baseline and stable Postop Assessment: no apparent nausea or vomiting Anesthetic complications: no    Last Vitals:  Vitals:   04/18/18 1121 04/18/18 1130  BP:  (!) 107/57  Pulse:    Resp: (!) 25 (!) 25  Temp: (!) 36.1 C (!) 36.1 C  SpO2: 99% 100%                 TAudry Pili

## 2018-04-18 NOTE — Progress Notes (Signed)
Report called to anesthesia patient transported to OR [facility]SS

## 2018-04-18 NOTE — Anesthesia Procedure Notes (Signed)
Arterial Line Insertion Start/End8/20/2019 7:00 AM, 04/18/2018 7:05 AM Performed by: CRNA  Patient location: Pre-op. Preanesthetic checklist: patient identified, IV checked, site marked, risks and benefits discussed, surgical consent, monitors and equipment checked, pre-op evaluation, timeout performed and anesthesia consent Lidocaine 1% used for infiltration Right, radial was placed Catheter size: 20 G Hand hygiene performed  and maximum sterile barriers used   Attempts: 2 Procedure performed without using ultrasound guided technique. Following insertion, Biopatch and dressing applied. Post procedure assessment: normal  Patient tolerated the procedure well with no immediate complications.

## 2018-04-18 NOTE — Progress Notes (Signed)
Patient interviewed in the preop area. Patient able to confirm name, DOB, procedure, allergies and npo status. Patient states 3/10 "soreness" in his pelvic area.   Leatha Gilding, RN

## 2018-04-18 NOTE — Transfer of Care (Signed)
Immediate Anesthesia Transfer of Care Note  Patient: Dalton Townsend  Procedure(s) Performed: TRANSCATHETER AORTIC VALVE REPLACEMENT, TRANSAPICAL. 34mm EDWARDS SAPIEN 3 TRANSCATHETER HEART VALVE. (N/A Chest) TRANSESOPHAGEAL ECHOCARDIOGRAM (TEE) (N/A )  Patient Location: ICU  Anesthesia Type:General  Level of Consciousness: awake, alert , oriented, drowsy and patient cooperative  Airway & Oxygen Therapy: Patient Spontanous Breathing and Patient connected to nasal cannula oxygen  Post-op Assessment: Report given to RN and Post -op Vital signs reviewed and stable  Post vital signs: Reviewed and stable  Last Vitals:  Vitals Value Taken Time  BP    Temp    Pulse    Resp    SpO2      Last Pain:  Vitals:   04/18/18 0305  TempSrc: Oral  PainSc:       Patients Stated Pain Goal: 3 (01/21/90 0289)  Complications: No apparent anesthesia complications

## 2018-04-18 NOTE — Progress Notes (Signed)
  [place] VALVE TEAM  Patient doing well s/p TAVR. He is hemodynamically stable. Sleeping. Has had some post op pain treated with morphine and tramadol. Chest tube in place. Groin and radial sites are stable. Hg remains ~8. CXR has some vascular congestion. Breathing is stable for now. Will continue to monitor.   Angelena Form PA-C  [facility]  Pager [phone]

## 2018-04-18 NOTE — Anesthesia Procedure Notes (Signed)
Central Venous Catheter Insertion Performed by: Roberts Gaudy, MD, anesthesiologist Start/End8/20/2019 6:55 AM, 04/18/2018 7:05 AM Patient location: Pre-op. Preanesthetic checklist: patient identified, IV checked, site marked, risks and benefits discussed, surgical consent, monitors and equipment checked, pre-op evaluation, timeout performed and anesthesia consent Lidocaine 1% used for infiltration and patient sedated Hand hygiene performed  and maximum sterile barriers used  Catheter size: 8 Fr Total catheter length 16. Central line was placed.Double lumen Procedure performed using ultrasound guided technique. Ultrasound Notes:image(s) printed for medical record Attempts: 1 Following insertion, dressing applied and line sutured. Post procedure assessment: blood return through all ports  Patient tolerated the procedure well with no immediate complications.

## 2018-04-18 NOTE — Op Note (Signed)
CARDIOTHORACIC SURGERY OPERATIVE NOTE  Date of Procedure:  04/18/2018  Preoperative Diagnosis: Severe Aortic Stenosis   Postoperative Diagnosis: Same   Procedure:    Transcatheter Aortic Valve Replacement - Transapical Approach  Edwards Sapien XT THV (size 26 mm, model # 9600TFX, serial # [id number])   Co-Surgeons:  Gaye Pollack, MD and Sherren Mocha, MD  Assistants:   Valentina Gu. Roxy Manns, MD  Anesthesiologist:  Renold Don, MD  Echocardiographer:  Ena Dawley, MD  Pre-operative Echo Findings:   critical aortic stenosis   normal left ventricular systolic function   Post-operative Echo Findings:  mild paravalvular leak  normal left ventricular systolic function  BRIEF CLINICAL NOTE AND INDICATIONS FOR SURGERY  The patient is a 84 year old gentleman with a history of hypertension, paroxysmal atrial fibrillation on Eliquis, dyslipidemia, cerebrovascular disease status post CVA in 2001 followed by right carotid endarterectomy, complete heart block status post permanent pacemaker placement in 2006, coronary disease status post coronary bypass graft surgery by Dr. Roxan Hockey in 1989, and known severe aortic stenosis.  This has been followed by Dr. Marlou Porch.  An echocardiogram in 07/2016 showed severe aortic stenosis with a mean gradient of 47 mmHg.  The patient was not having any symptoms at that time refused further work-up.  He had a repeat echo in 05/2017 which showed no significant change in the mean gradient of 41 mmHg with normal left ventricular systolic function.  The patient remained asymptomatic.  Over the past year or so he has noted some exertional shortness of breath and fatigue but has not significantly affected his activity level.  He said that he was going to the gym daily and working on aerobic exercise machines such as a recumbent bicycle.  He had another echocardiogram on 12/20/2017 which showed no significant change in the severe aortic stenosis with a mean gradient  of 40 mmHg and normal left ventricular ejection fraction of 55 to 60%.  Then he noticed worsening shortness of breath after he woke up from a nap and could not walk to the bathroom without significant shortness of breath.  His family called EMS and in the emergency department his troponin was 0.03.  Chest x-ray showed pulmonary edema.  A repeat echocardiogram showed critical aortic stenosis with a mean gradient of 53 mmHg and a peak gradient of 88 mmHg.  The dimensionless index was 0.26 with a calculated valve area of 0.73 cm.  Left ventricular systolic function remain normal.  He improved with intravenous diuresis. Cardiac cath showed patent bypass grafts. Gated cardiac CTA and CTA of the chest, abdomen, and pelvis were done and showed anatomy that was felt to be suitable for TAVR. He underwent attempted right transfemoral TAVR last Tuesday that was complicated by injury to the right external iliac artery requiring emergent ligation of the external iliac artery and fem-fem bypass. He made a good recovery from this and after further review and discussion by the team it is felt that trans-apical insertion is the only option.  Following the decision to proceed with transcatheter aortic valve replacement, a discussion has been held regarding what types of management strategies would be attempted intraoperatively in the event of life-threatening complications, including whether or not the patient would be considered a candidate for the use of cardiopulmonary bypass and/or conversion to open sternotomy for attempted surgical intervention.  The patient has been advised of a variety of complications that might develop peculiar to this approach including but not limited to risks of death, stroke, paravalvular leak, aortic dissection or  other major vascular complications, aortic annulus rupture, device embolization, cardiac rupture or perforation, acute myocardial infarction, arrhythmia, heart block or bradycardia  requiring permanent pacemaker placement, congestive heart failure, respiratory failure, renal failure, pneumonia, infection, other late complications related to structural valve deterioration or migration, or other complications that might ultimately cause a temporary or permanent loss of functional independence or other long term morbidity.  The patient provides full informed consent for the procedure as described and all questions were answered preoperatively.    DETAILS OF THE OPERATIVE PROCEDURE  PREPARATION:    The patient is brought to the operating room on the above mentioned date and central monitoring was established by the anesthesia team including placement of a double lumen central line and right radial arterial line. The patient is placed in the supine position on the operating table.  Intravenous antibiotics are administered. General endotracheal anesthesia is induced uneventfully. A Foley catheter is placed.  Baseline transesophageal echocardiogram was performed.  Findings were notable for critical aortic stenosis with a mean gradient of 50 mm Hg and peak of 88 mm Hg. There was mild AI. LVEF was 60-65%.  The patient's chest, abdomen, both groins, and both lower extremities are prepared and draped in a sterile manner. A time out procedure is performed.   PERIPHERAL ACCESS:    Femoral venous access is obtained with placement of a 6 Fr sheath on the left side using ultrasound guidance.  Left radial artery access was obtained using ultrasound guidance and micropuncture technique. A 58F sheath was inserted. A pigtail diagnostic catheter is passed through the left radial arterial sheath under fluoroscopic guidance into the aortic root.  A temporary transvenous pacemaker catheter is passed through the left femoral venous sheath under fluoroscopic guidance into the right ventricle.  The pacemaker is tested to ensure stable lead placement and pacemaker capture.   TRANSAPICAL ACCESS:   The  location of the left ventricular apex is confirmed using flouroscopy, and a miniature left thoracotomy incision is made directly over the left ventricular apex.  The left pleural space is entered.  No adhesions are encountered.  A soft tissue retractor is placed and the ribs gently spread to expose the pericardial surface.  A longitudinal incision is made in the pericardium and the left ventricular apex inspected.  There are  intrapericardial adhesions from his prior CABG.  An appropriate site for left ventricular sheath placement is chosen well lateral to the left anterior descending coronary artery and verified using TEE.  Two pursestring sutures are placed using pledgeted 2-0 Prolene suture in a diamond orientation.   The patient is heparinized systemically and ACT verified > 250 seconds.  The left ventricular apex is punctured using an  18 gauge needle and a soft J-tipped guidewire is passed into the left ventricle and through the aortic valve under fluoroscopic guidance while also continuously monitoring TEE for signs of entrapment in the mitral apparatus.  A 6 Fr sheath is placed over the guidewire and across the aortic valve.  A JR-4 catheter is passed through the sheath and maneuvered around the aortic arch into the descending thoracic aorta under fluoroscopic guidance.  An Amplatz extra stiff guidewire is passed through the JR-4 catheter into the descending thoracic aorta and both the JR-4 catheter and the introducing sheath are removed.  An Edwards Certitude introducer sheath was inserted over the stiff wire into the LV. The sheath position is continuously monitored and secured by the surgical assistant.     BALLOON AORTIC VALVULOPLASTY:  Not performed since it had been performed at the prior procedure one week ago.    TRANSCATHETER HEART VALVE DEPLOYMENT:  An Edwards Sapien 3 transcatheter heart valve (size 26 mm, model # 9600TFX, serial #[id number]) is prepared and crimped per manufacturer's  guidelines, and the proper orientation of the valve is confirmed on the [place] Certitude delivery system.  The delivery system loader is advanced into the introducing sheath.  The valve and delivery system are advanced through the loader into the sheath and all air is evacuated.  The valve and balloon are advanced into the left ventricle and part way through the aortic valve.  The valve is then finely positioned in the aortic valve. Valve position is also confirmed using TEE, and care is taken to make certain there is no sign of entanglement in the mitral apparatus.  Once final position of the valve has been confirmed, the valve is deployed while temporarily holding ventilation and during rapid ventricular pacing to maintain systolic blood pressure < 50 mmHg and pulse pressure < 10 mmHg.  The balloon inflation is held for >3 seconds after reaching full deployment volume.  Once the balloon has fully deflated the balloon is retracted into the left ventricle and valve function is assessed using TEE.   There is felt to be mild paravalvular leak along the non-coronary annulus and no central aortic insufficiency.  Left ventricular function is unchanged from preoperatively.  There is no mitral regurgitation.  The patient's hemodynamic recovery following valve deployment is excellent. There was some calcium along this portion of the annulus and we decided to add 1 cc of saline to the balloon and do a second inflation. The balloon was then advanced across the valve and a second inflation was done under rapid pacing. Following post-deployment dilatation the degree of paravalvular leak noted by TEE was mild but improved.   The deployment balloon and guidewire are both removed.  PROCEDURE COMPLETION:  The left ventricular sheath is removed during rapid ventricular pacing to maintain systolic blood pressure < 70 mmHg while the pursestring sutures are tied.  The apical closure is inspected and notably hemostatic.   Protamine is administered.    Once hemostasis has been ascertained, the left pleural space is drained using a single 28 Fr Bard chest tube and the mini thoracotomy incision is closed in layers and the skin incision closed using a subcuticular skin closure.   The temporary pacemaker, pigtail catheter and  femoral sheath are removed. The radial sheath was removed and a TR band applied.  The patient tolerated the procedure well and is transported to the surgical intensive care in stable condition. There are no intraoperative complications. All sponge instrument and needle counts are verified correct at completion of the operation.  One unit of PRBC's was administered during the operation due to preop Hgb of 8.1 that dropped to 7.7 in the OR with dilution. EBL was minimal .  The patient received a total of 40 mL of intravenous contrast during the procedure.    Gaye Pollack, MD 04/18/2018 11:06 AM

## 2018-04-18 NOTE — Anesthesia Procedure Notes (Addendum)
Procedure Name: Intubation Date/Time: 04/18/2018 8:03 AM Performed by: Mariea Clonts, CRNA Pre-anesthesia Checklist: Patient identified, Emergency Drugs available, Suction available and Patient being monitored Patient Re-evaluated:Patient Re-evaluated prior to induction Oxygen Delivery Method: Circle System Utilized Preoxygenation: Pre-oxygenation with 100% oxygen Induction Type: IV induction Ventilation: Mask ventilation without difficulty Laryngoscope Size: Mac and 4 Grade View: Grade I Tube type: Oral Tube size: 7.5 mm Number of attempts: 1 Airway Equipment and Method: Stylet and Oral airway Placement Confirmation: ETT inserted through vocal cords under direct vision,  positive ETCO2 and breath sounds checked- equal and bilateral Tube secured with: Tape Dental Injury: Teeth and Oropharynx as per pre-operative assessment  Comments: Placed by Lupita Shutter, SRNA

## 2018-04-18 NOTE — Op Note (Signed)
  [facility]   MULTIDISCIPLINARY HEART VALVE TEAM   TAVR OPERATIVE NOTE   Date of Procedure:  04/18/2018  Preoperative Diagnosis: Severe Aortic Stenosis   Postoperative Diagnosis: Same   Procedure:    Transcatheter Aortic Valve Replacement -  Transapical Approach  Edwards Sapien 3 THV (size 26 mm, model # 9600TFX, serial # [id number])   Co-Surgeons:  Gaye Pollack, MD and Sherren Mocha, MD  Assisting: Darylene Price, MD  Anesthesiologist:  Renold Don, MD  Echocardiographer:  Ena Dawley, MD  Pre-operative Echo Findings:  Critical aortic stenosis  Normal left ventricular systolic function  Post-operative Echo Findings:  Mild paravalvular leak  Normal left ventricular systolic function  BRIEF CLINICAL NOTE AND INDICATIONS FOR SURGERY  Please see the complete note of Dr Cyndia Bent  DETAILS OF THE OPERATIVE PROCEDURE  PREPARATION:   The patient is brought to the operating room on the above mentioned date and central monitoring was established by the anesthesia team including placement of a central venous catheter and radial arterial line. The patient is placed in the supine position on the operating table.  Intravenous antibiotics are administered.  General endotracheal anesthesia is induced uneventfully.  A Foley catheter is placed.  Baseline transesophagealechocardiogram is performed. The patient's chest, abdomen, both groins, and both lower extremities are prepared and draped in a sterile manner. A time out procedure is performed.   PERIPHERAL ACCESS:   Using ultrasound guidance, femoral venous access is obtained with placement of 6 Fr sheath on the left side.  [place] images are captured and stored in the patient's chart. Using US guidance, left radial arterial access is obtained.   A JR4 catheter is used to direct a versicore wire into the aortic root and this is changed out for a pigtail catheter. A temporary transvenous pacemaker catheter was passed  through the femoral venous sheath under fluoroscopic guidance into the right ventricle.  The pacemaker was tested to ensure stable lead placement and pacemaker capture. Aortic root angiography was performed in order to determine the optimal angiographic angle for valve deployment.  TRANSFEMORAL ACCESS:  Please see note of Dr Liliane Channel AORTIC VALVULOPLASTY:  Not performed  TRANSCATHETER HEART VALVE DEPLOYMENT:  See Dr Vivi Martens note  PROCEDURE COMPLETION:  The LV apex is closed by Dr Cyndia Bent and Dr Roxy Manns.  Rapid pacing is utilized to control blood pressure.  Protamine is administered once transapical repair was complete.  The temporary pacemaker, pigtail catheters and femoral/radial sheaths were removed with manual pressure used for femoral hemostasis and a TR band used for radial hemostasis.   The patient tolerated the procedure well and is transported to the surgical intensive care in stable condition. There were no immediate intraoperative complications. All sponge instrument and needle counts are verified correct at completion of the operation.   The patient received a total of 40 mL of intravenous contrast during the procedure.   Sherren Mocha, MD 04/18/2018 11:17 PM

## 2018-04-19 ENCOUNTER — Inpatient Hospital Stay (HOSPITAL_COMMUNITY): Payer: Medicare Other

## 2018-04-19 ENCOUNTER — Encounter (HOSPITAL_COMMUNITY): Payer: Self-pay | Admitting: Cardiovascular Disease

## 2018-04-19 DIAGNOSIS — I361 Nonrheumatic tricuspid (valve) insufficiency: Secondary | ICD-10-CM

## 2018-04-19 DIAGNOSIS — Z954 Presence of other heart-valve replacement: Secondary | ICD-10-CM

## 2018-04-19 LAB — BASIC METABOLIC PANEL
Anion gap: 4 — ABNORMAL LOW (ref 5–15)
BUN: 24 mg/dL — AB (ref 8–23)
CHLORIDE: 107 mmol/L (ref 98–111)
CO2: 24 mmol/L (ref 22–32)
CREATININE: 1.02 mg/dL (ref 0.61–1.24)
Calcium: 8.2 mg/dL — ABNORMAL LOW (ref 8.9–10.3)
GFR calc Af Amer: >60 mL/min (ref >60)
GFR calc non Af Amer: >60 mL/min (ref >60)
GLUCOSE: 150 mg/dL — AB (ref 70–99)
Potassium: 4.7 mmol/L (ref 3.5–5.1)
SODIUM: 135 mmol/L (ref 135–145)

## 2018-04-19 LAB — ECHOCARDIOGRAM COMPLETE
HEIGHTINCHES: 71 in
WEIGHTICAEL: 3146.41 [oz_av]

## 2018-04-19 LAB — CBC
HCT: 27 % — ABNORMAL LOW (ref 39.0–52.0)
HEMOGLOBIN: 8.6 g/dL — AB (ref 13.0–17.0)
MCH: 30.4 pg (ref 26.0–34.0)
MCHC: 31.9 g/dL (ref 30.0–36.0)
MCV: 95.4 fL (ref 78.0–100.0)
Platelets: 228 10*3/uL (ref 150–400)
RBC: 2.83 MIL/uL — ABNORMAL LOW (ref 4.22–5.81)
RDW: 15.3 % (ref 11.5–15.5)
WBC: 9.4 10*3/uL (ref 4.0–10.5)

## 2018-04-19 LAB — MAGNESIUM: MAGNESIUM: 2.1 mg/dL (ref 1.7–2.4)

## 2018-04-19 MED ORDER — FUROSEMIDE 10 MG/ML IJ SOLN
40.0000 mg | Freq: Once | INTRAMUSCULAR | Status: AC
  Administered 2018-04-19: 40 mg via INTRAVENOUS
  Filled 2018-04-19: qty 4

## 2018-04-19 MED FILL — Heparin Sodium (Porcine) Inj 1000 Unit/ML: INTRAMUSCULAR | Qty: 30 | Status: AC

## 2018-04-19 MED FILL — Potassium Chloride Inj 2 mEq/ML: INTRAVENOUS | Qty: 40 | Status: AC

## 2018-04-19 MED FILL — Electrolyte-R (PH 7.4) Solution: INTRAVENOUS | Qty: 2000 | Status: AC

## 2018-04-19 MED FILL — Magnesium Sulfate Inj 50%: INTRAMUSCULAR | Qty: 10 | Status: AC

## 2018-04-19 NOTE — Progress Notes (Signed)
1 Day Post-Op Procedure(s) (LRB): TRANSCATHETER AORTIC VALVE REPLACEMENT, TRANSAPICAL. 64m EDWARDS SAPIEN 3 TRANSCATHETER HEART VALVE. (N/A) TRANSESOPHAGEAL ECHOCARDIOGRAM (TEE) (N/A) Subjective: No specific complaints. Sitting up in chair  Objective: Vital signs in last 24 hours: Temp:  [97 F (36.1 C)-99 F (37.2 C)] 98.2 F (36.8 C) (08/21 0600) Cardiac Rhythm: A-V Sequential paced (08/20 1300) Resp:  [13-25] 13 (08/21 0600) BP: (103-135)/(48-79) 122/72 (08/21 0600) SpO2:  [90 %-100 %] 98 % (08/21 0600) Arterial Line BP: (123-168)/(41-58) 133/43 (08/20 1700) Weight:  [89.2 kg] 89.2 kg (08/21 0600)  Hemodynamic parameters for last 24 hours:    Intake/Output from previous day: 08/20 0701 - 08/21 0700 In: 3632.9 [P.O.:240; I.V.:2626.9; Blood:315; IV Piggyback:450.9] Out: 1835 [Urine:1685; Blood:50; Chest Tube:100] Intake/Output this shift: No intake/output data recorded.  General appearance: alert and cooperative Neurologic: intact Heart: regular rate and rhythm, S1, S2 normal, no murmur, click, rub or gallop Lungs: clear to auscultation bilaterally Extremities: edema mild Wound: dressing dry Chest tube output thin and serosanguinous.  Lab Results: Recent Labs    04/18/18 1308 04/19/18 0503  WBC 10.2 9.4  HGB 8.0* 8.6*  HCT 26.2* 27.0*  PLT 211 228   BMET:  Recent Labs    04/18/18 0301 04/18/18 1129 04/19/18 0503  NA 138 137 135  K 4.1 4.3 4.7  CL 104  --  107  CO2 25  --  24  GLUCOSE 117* 141* 150*  BUN 22  --  24*  CREATININE 1.13  --  1.02  CALCIUM 8.9  --  8.2*    PT/INR:  Recent Labs    04/17/18 1414  LABPROT 13.9  INR 1.08   ABG    Component Value Date/Time   PHART 7.402 04/18/2018 1103   HCO3 23.6 04/18/2018 1103   TCO2 25 04/18/2018 1103   ACIDBASEDEF 1.0 04/18/2018 1103   O2SAT 99.0 04/18/2018 1103   CBG (last 3)  No results for input(s): GLUCAP in the last 72 hours.  CXR: pending  ECG: atrial sensed, vent  paced.  Assessment/Plan: S/P Procedure(s) (LRB): TRANSCATHETER AORTIC VALVE REPLACEMENT, TRANSAPICAL. 259mEDWARDS SAPIEN 3 TRANSCATHETER HEART VALVE. (N/A) TRANSESOPHAGEAL ECHOCARDIOGRAM (TEE) (N/A)  POD 1 Hemodynamically stable in paced rhythm as before.  CXR just ordered. If it is ok will remove the chest tube since not draining much.  Expected acute blood loss anemia: stable. Will put on iron.  IS, ambulation  2D echo today.  Mild volume excess with weight up 6 lbs from preop and probably 12 lbs from admission. Will give him a dose of lasix.  Plan to keep in 2HAndersonoday for ambulation and observation given his age and complicated course. If he does well he can go to 4E tomorrow. Eventual plan is home with family Friday or Saturday. Discussed status and plans with daughter.     LOS: 8 days    BrGaye Pollack/21/2019

## 2018-04-19 NOTE — Progress Notes (Signed)
Patient ID: Dalton Townsend, male   DOB: 1922/12/09, 84 y.o.   MRN: [id number] TCTS Evening Rounds:  Hemodynamically stable in paced rhythm.  Ambulated once today  Echo reviewed and aortic valve prosthesis looks good with mean gradient of 3 mm Hg, no AI seen.   Discussed with patient and family.

## 2018-04-19 NOTE — Progress Notes (Signed)
  Echocardiogram 2D Echocardiogram has been performed.  Dalton Townsend G [name] Stoneham 04/19/2018, 10:49 AM

## 2018-04-19 NOTE — Progress Notes (Signed)
   VASCULAR SURGERY ASSESSMENT & PLAN:   POD 8 s/p left to right femorofemoral bypass.  His Praveena dressing was removed on Monday.  His incisions look fine.  He will need to have his staples out possibly later this week.  Postop day 1 status post TAVR.  The patient is doing very well from this standpoint.  SUBJECTIVE:   No complaints this morning.  PHYSICAL EXAM:   Vitals:   04/19/18 0500 04/19/18 0600 04/19/18 0700 04/19/18 0800  BP: 132/68 122/72 (!) 146/79 (!) 145/58  Pulse:      Resp: 20 13 20 16   Temp: 98.2 F (36.8 C) 98.2 F (36.8 C) 98.4 F (36.9 C) 98.6 F (37 C)  TempSrc:      SpO2: 98% 98% 96% 98%  Weight:  89.2 kg    Height:       His groin incisions look fine. He has good Doppler signals in his femorofemoral graft and bilateral dorsalis pedis and posterior tibial arteries.  LABS:   Lab Results  Component Value Date   WBC 9.4 04/19/2018   HGB 8.6 (L) 04/19/2018   HCT 27.0 (L) 04/19/2018   MCV 95.4 04/19/2018   PLT 228 04/19/2018   PROBLEM LIST:    Principal Problem:   Severe aortic stenosis Active Problems:   HTN (hypertension)   Ulcerative colitis ([place])   Acute on chronic congestive heart failure (HCC)   Presence of permanent cardiac pacemaker   PAF (paroxysmal atrial fibrillation) (HCC)   Carotid artery disease (HCC)   CAD (coronary artery disease)   BPH (benign prostatic hyperplasia)   GAD (generalized anxiety disorder)   Aortic stenosis   CURRENT MEDS:   . aspirin  81 mg Oral Daily  . atorvastatin  20 mg Oral QODAY  . Chlorhexidine Gluconate Cloth  6 each Topical Daily  . escitalopram  10 mg Oral Daily  . folic acid-pyridoxine-cyancobalamin  1 tablet Oral Daily  . iron polysaccharides  150 mg Oral Daily  . sodium chloride flush  10-40 mL Intracatheter Q12H  . sodium chloride flush  3 mL Intravenous Q12H    Deitra Mayo Beeper: [phone] Office: [phone] 04/19/2018

## 2018-04-20 ENCOUNTER — Other Ambulatory Visit: Payer: Self-pay | Admitting: Physician Assistant

## 2018-04-20 ENCOUNTER — Inpatient Hospital Stay (HOSPITAL_COMMUNITY): Payer: Medicare Other

## 2018-04-20 DIAGNOSIS — Z952 Presence of prosthetic heart valve: Secondary | ICD-10-CM

## 2018-04-20 LAB — BASIC METABOLIC PANEL
ANION GAP: 5 (ref 5–15)
BUN: 24 mg/dL — ABNORMAL HIGH (ref 8–23)
CALCIUM: 8.3 mg/dL — AB (ref 8.9–10.3)
CO2: 27 mmol/L (ref 22–32)
Chloride: 106 mmol/L (ref 98–111)
Creatinine, Ser: 1.11 mg/dL (ref 0.61–1.24)
GFR, EST NON AFRICAN AMERICAN: 56 mL/min — AB (ref >60)
Glucose, Bld: 113 mg/dL — ABNORMAL HIGH (ref 70–99)
Potassium: 3.9 mmol/L (ref 3.5–5.1)
SODIUM: 138 mmol/L (ref 135–145)

## 2018-04-20 LAB — CBC
HEMATOCRIT: 28.2 % — AB (ref 39.0–52.0)
Hemoglobin: 8.6 g/dL — ABNORMAL LOW (ref 13.0–17.0)
MCH: 29.9 pg (ref 26.0–34.0)
MCHC: 30.5 g/dL (ref 30.0–36.0)
MCV: 97.9 fL (ref 78.0–100.0)
PLATELETS: 231 10*3/uL (ref 150–400)
RBC: 2.88 MIL/uL — ABNORMAL LOW (ref 4.22–5.81)
RDW: 15.9 % — AB (ref 11.5–15.5)
WBC: 10.5 10*3/uL (ref 4.0–10.5)

## 2018-04-20 MED ORDER — FUROSEMIDE 40 MG PO TABS
40.0000 mg | ORAL_TABLET | Freq: Every day | ORAL | Status: DC
  Administered 2018-04-20 – 2018-04-22 (×3): 40 mg via ORAL
  Filled 2018-04-20 (×3): qty 1

## 2018-04-20 MED ORDER — DOCUSATE SODIUM 100 MG PO CAPS
100.0000 mg | ORAL_CAPSULE | Freq: Every day | ORAL | Status: DC
  Administered 2018-04-20 – 2018-04-22 (×3): 100 mg via ORAL
  Filled 2018-04-20 (×3): qty 1

## 2018-04-20 MED ORDER — METOLAZONE 2.5 MG PO TABS
2.5000 mg | ORAL_TABLET | Freq: Once | ORAL | Status: AC
  Administered 2018-04-20: 2.5 mg via ORAL
  Filled 2018-04-20: qty 1

## 2018-04-20 MED ORDER — METOPROLOL SUCCINATE ER 50 MG PO TB24
50.0000 mg | ORAL_TABLET | Freq: Every day | ORAL | Status: DC
  Administered 2018-04-20 – 2018-04-22 (×3): 50 mg via ORAL
  Filled 2018-04-20 (×3): qty 1

## 2018-04-20 MED ORDER — POTASSIUM CHLORIDE CRYS ER 20 MEQ PO TBCR
20.0000 meq | EXTENDED_RELEASE_TABLET | Freq: Two times a day (BID) | ORAL | Status: DC
  Administered 2018-04-20 – 2018-04-22 (×5): 20 meq via ORAL
  Filled 2018-04-20 (×5): qty 1

## 2018-04-20 NOTE — Progress Notes (Addendum)
CARDIAC REHAB PHASE I   PRE:  Rate/Rhythm: 97 paced  BP:  Supine:   Sitting: 120/68  Standing: =   SaO2: 97%RA  MODE:  Ambulation: 370 ft   POST:  Rate/Rhythm: 110 paced  BP:  Supine:   Sitting: 141/65  Standing:    SaO2: 94%RA 1426-1500 Pt walked 370 ft on RA with gait belt use and rolling walker with minimal asst. Did not need to stop and rest and offered rest stops to pt. Pt walks stooped which pt's daughter stated is normal for him. To recliner after walk and then assisted pt to void. Would recommend HHPT for pt to assist with strengthening and for motivation. PT note from 8/19 recommended HHPT at that time. Daughter stated he has walker and gait belt at home. Third walk for today. Pt had PT services here prior to surgery. Would recommend that they see pt again while in hospital.   Graylon Good, RN BSN  04/20/2018 2:54 PM

## 2018-04-20 NOTE — Discharge Instructions (Signed)
[facility]  Discharge instructions  Lower Extremity Bypass Surgery  Please refer to the following instruction for your post-procedure care. Your surgeon or physician assistant will discuss any changes with you.  Activity  You are encouraged to walk as much as you can. You can slowly return to normal activities during the month after your surgery. Avoid strenuous activity and heavy lifting until your doctor tells you it's OK. Avoid activities such as vacuuming or swinging a golf club. Do not drive until your doctor give the OK and you are no longer taking prescription pain medications. It is also normal to have difficulty with sleep habits, eating and bowel movement after surgery. These will go away with time.  Bathing/Showering  Shower daily after you go home. Do not soak in a bathtub, hot tub, or swim until the incision heals completely.  Incision Care  Clean your incision with mild soap and water. Shower every day. Pat the area dry with a clean towel. You do not need a bandage unless otherwise instructed. Do not apply any ointments or creams to your incision. If you have open wounds you will be instructed how to care for them or a visiting nurse may be arranged for you. If you have staples or sutures along your incision they will be removed at your post-op appointment. You may have skin glue on your incision. Do not peel it off. It will come off on its own in about one week.  Wash the groin wound with soap and water daily and pat dry. (No tub bath-only shower)  Then put a dry gauze or washcloth in the groin to keep this area dry to help prevent wound infection.  Do this daily and as needed.  Do not use Vaseline or neosporin on your incisions.  Only use soap and water on your incisions and then protect and keep dry.  Diet  Resume your normal diet. There are no special food restrictions following this procedure. A low fat/ low cholesterol diet is  recommended for all patients with vascular disease. In order to heal from your surgery, it is CRITICAL to get adequate nutrition. Your body requires vitamins, minerals, and protein. Vegetables are the best source of vitamins and minerals. Vegetables also provide the perfect balance of protein. Processed food has little nutritional value, so try to avoid this.  Medications  Resume taking all your medications unless your doctor or physician assistant tells you not to. If your incision is causing pain, you may take over-the-counter pain relievers such as acetaminophen (Tylenol). If you were prescribed a stronger pain medication, please aware these medication can cause nausea and constipation. Prevent nausea by taking the medication with a snack or meal. Avoid constipation by drinking plenty of fluids and eating foods with high amount of fiber, such as fruits, vegetables, and grains. Take Colace 100 mg (an over-the-counter stool softener) twice a day as needed for constipation.  Do not take Tylenol if you are taking prescription pain medications.  Follow Up  Our office will schedule a follow up appointment 2-3 weeks following discharge.  Please call us immediately for any of the following conditions  Severe or worsening pain in your legs or feet while at rest or while walking Increase pain, redness, warmth, or drainage (pus) from your incision site(s) Fever of 101 degree or higher The swelling in your leg with the bypass suddenly worsens and becomes more painful than when you were in the hospital If you have  been instructed to feel your graft pulse then you should do so every day. If you can no longer feel this pulse, call the office immediately. Not all patients are given this instruction.  Leg swelling is common after leg bypass surgery.  The swelling should improve over a few months following surgery. To improve the swelling, you may elevate your legs above the level of your heart while you are  sitting or resting. Your surgeon or physician assistant may ask you to apply an ACE wrap or wear compression (TED) stockings to help to reduce swelling.  Reduce your risk of vascular disease  Stop smoking. If you would like help call [facility] at 1-800-QUIT-NOW (431)697-0222) or [place] at (765)329-1783.  Manage your cholesterol Maintain a desired weight Control your diabetes weight Control your diabetes Keep your blood pressure down  If you have any questions, please call the office at [phone]    ACTIVITY AND EXERCISE  Daily activity and exercise are an important part of your recovery. People recover at different rates depending on their general health and type of valve procedure.  Most people recovering from TAVR feel better relatively quickly   No lifting, pushing, pulling more than 10 pounds (examples to avoid: groceries, vacuuming, gardening, golfing):             - For one week with a procedure through the groin.             - For six weeks for procedures through the chest wall.             - For three months for procedures through the breast-bone. NOTE: You will typically see one of our providers 7-10 days after your procedure to discuss [place] the above activities.      DRIVING  Do not drive for until you are seen for follow up and cleared by a provider.  If you have been told by your doctor in the past that you may not drive, you must talk with him/her before you begin driving again.     DRESSING  Groin site: you may leave the clear dressing over the site for up to one week or until it falls off.     HYGIENE  If you had a femoral (leg) procedure, you may take a shower when you return home. After the shower, pat the site dry. Do NOT use powder, oils or lotions in your groin area until the site has completely healed.  If you had a chest procedure, you may shower when you return home unless specifically instructed not to by your discharging  practitioner.             - DO NOT scrub incision; pat dry with a towel             - DO NOT apply any lotions, oils, powders to the incision             - No tub baths / swimming for at least 2 weeks.  If you notice any fevers, chills, increased pain, swelling, bleeding or pus, please contact your doctor.   ADDITIONAL INFORMATION  If you are going to have an upcoming dental procedure, please contact our office as you will require antibiotics ahead of time to prevent infection on your heart valve.       After TAVR Checklist  Check  Test Description   Follow up appointment in 1-2 weeks  You will see our structural heart physician assistant, Joellen Jersey  [place]. Your incision sites will be checked and you will be cleared to drive and resume all normal activities if you are doing well.     1 month echo and follow up  You will have an echo to check on your new heart valve and be seen back in the office by Nell Range. Many times the echo is not read by your appointment time, but Joellen Jersey will call you later that day or the following day to report your results.   Follow up with your primary cardiologist You will need to be seen by your primary cardiologist in the following 3-6 months after your 1 month appointment in the valve clinic. Often times your Plavix or Aspirin will be discontinued during this time, but this is decided on a case by case basis.    1 year echo and follow up You will have another echo to check on your heart valve after 1 year and be seen back in the office by Nell Range. This your last structural heart visit.   Bacterial endocarditis prophylaxis  You will have to take antibiotics for the rest of your life before all dental procedures (even teeth cleanings) to protect your heart valve. Antibiotics are also required before some surgeries. Please check with your cardiologist before scheduling any surgeries. Also, please make sure to tell us if you have a penicillin allergy as you  will require an alternative antibiotic.

## 2018-04-20 NOTE — Progress Notes (Signed)
2 Days Post-Op Procedure(s) (LRB): TRANSCATHETER AORTIC VALVE REPLACEMENT, TRANSAPICAL. 42m EDWARDS SAPIEN 3 TRANSCATHETER HEART VALVE. (N/A) TRANSESOPHAGEAL ECHOCARDIOGRAM (TEE) (N/A) Subjective: No complaints. Ambulated this am.  Objective: Vital signs in last 24 hours: Temp:  [97.4 F (36.3 C)-98.6 F (37 C)] 98.6 F (37 C) (08/22 0400) Cardiac Rhythm: Ventricular paced (08/22 0400) Resp:  [13-24] 15 (08/22 0700) BP: (110-158)/(45-98) 142/63 (08/22 0700) SpO2:  [95 %-100 %] 100 % (08/22 0700) Weight:  [90 kg] 90 kg (08/22 0600)  Hemodynamic parameters for last 24 hours:    Intake/Output from previous day: 08/21 0701 - 08/22 0700 In: 240.1 [P.O.:140; IV Piggyback:100.1] Out: 1065 [Urine:1065] Intake/Output this shift: No intake/output data recorded.  General appearance: alert and cooperative Neurologic: intact Heart: regular rate and rhythm, S1, S2 normal, no murmur, click, rub or gallop Lungs: clear to auscultation bilaterally Extremities: edema mild peripheral and scrotal edema Wound: incisions looks ok. staples still in groin incisions.  Lab Results: Recent Labs    04/19/18 0503 04/20/18 0145  WBC 9.4 10.5  HGB 8.6* 8.6*  HCT 27.0* 28.2*  PLT 228 231   BMET:  Recent Labs    04/19/18 0503 04/20/18 0145  NA 135 138  K 4.7 3.9  CL 107 106  CO2 24 27  GLUCOSE 150* 113*  BUN 24* 24*  CREATININE 1.02 1.11  CALCIUM 8.2* 8.3*    PT/INR:  Recent Labs    04/17/18 1414  LABPROT 13.9  INR 1.08   ABG    Component Value Date/Time   PHART 7.402 04/18/2018 1103   HCO3 23.6 04/18/2018 1103   TCO2 25 04/18/2018 1103   ACIDBASEDEF 1.0 04/18/2018 1103   O2SAT 99.0 04/18/2018 1103   CBG (last 3)  No results for input(s): GLUCAP in the last 72 hours.  CXR: ok  Assessment/Plan: S/P Procedure(s) (LRB): TRANSCATHETER AORTIC VALVE REPLACEMENT, TRANSAPICAL. 28mEDWARDS SAPIEN 3 TRANSCATHETER HEART VALVE. (N/A) TRANSESOPHAGEAL ECHOCARDIOGRAM (TEE)  (N/A)  POD 2 Hemodynamically stable in paced rhythm. SBP 130's to 150's so will resume Toprol 50.  Volume excess: wt is 14 lbs over admission wt and he has edema so will resume daily lasix 40 mg and will give a dose of metolazone today. Start Kdur bid.  Keep central line in for access today and DC left antecubital. Does not need another peripheral since he can probably go home tomorrow.   Check with vascular surgery about groin staples to see when they want them removed. With edema and age it may be best to leave them in longer.  Transfer to 4E  and plan home tomorrow if he is ambulating ok today.   LOS: 9 days    BrGaye Pollack/22/2019

## 2018-04-20 NOTE — Plan of Care (Signed)
  Problem: Education: Goal: Knowledge of General Education information will improve Description Including pain rating scale, medication(s)/side effects and non-pharmacologic comfort measures Outcome: Progressing   Problem: Clinical Measurements: Goal: Ability to maintain clinical measurements within normal limits will improve Outcome: Progressing Goal: Cardiovascular complication will be avoided Outcome: Progressing    Problem: Pain Managment: Goal: General experience of comfort will improve Outcome: Progressing  Given Tylenol for pain   Problem: Safety: Goal: Ability to remain free from injury will improve Outcome: Progressing  Pt ambulates well with walker Problem: Skin Integrity: Goal: Risk for impaired skin integrity will decrease Outcome: Progressing

## 2018-04-20 NOTE — Progress Notes (Signed)
Pt received from  2h via wheelchair. Pt c/aox4. Pt given chg bath. Tele box 4E01 applied. Vitals stable V paced rhythm. Family at bedside with belongings. Incisions to groin soft. Dressing clean dry and intact. Jerald Kief, RN

## 2018-04-21 ENCOUNTER — Encounter (HOSPITAL_COMMUNITY): Payer: Self-pay | Admitting: Physician Assistant

## 2018-04-21 ENCOUNTER — Telehealth: Payer: Self-pay | Admitting: Vascular Surgery

## 2018-04-21 DIAGNOSIS — Z95828 Presence of other vascular implants and grafts: Secondary | ICD-10-CM

## 2018-04-21 DIAGNOSIS — Z952 Presence of prosthetic heart valve: Secondary | ICD-10-CM

## 2018-04-21 LAB — POCT I-STAT, CHEM 8
BUN: 22 mg/dL (ref 8–23)
BUN: 20 mg/dL (ref 8–23)
BUN: 20 mg/dL (ref 8–23)
CALCIUM ION: 1.35 mmol/L (ref 1.15–1.40)
CREATININE: 0.7 mg/dL (ref 0.61–1.24)
CREATININE: 0.9 mg/dL (ref 0.61–1.24)
Calcium, Ion: 1.23 mmol/L (ref 1.15–1.40)
Calcium, Ion: 1.26 mmol/L (ref 1.15–1.40)
Chloride: 100 mmol/L (ref 98–111)
Chloride: 102 mmol/L (ref 98–111)
Chloride: 104 mmol/L (ref 98–111)
Creatinine, Ser: 0.9 mg/dL (ref 0.61–1.24)
Glucose, Bld: 108 mg/dL — ABNORMAL HIGH (ref 70–99)
Glucose, Bld: 120 mg/dL — ABNORMAL HIGH (ref 70–99)
Glucose, Bld: 126 mg/dL — ABNORMAL HIGH (ref 70–99)
HEMATOCRIT: 21 % — AB (ref 39.0–52.0)
HEMATOCRIT: 19 % — AB (ref 39.0–52.0)
HEMATOCRIT: 19 % — AB (ref 39.0–52.0)
HEMOGLOBIN: 6.5 g/dL — AB (ref 13.0–17.0)
Hemoglobin: 7.1 g/dL — ABNORMAL LOW (ref 13.0–17.0)
Hemoglobin: 6.5 g/dL — CL (ref 13.0–17.0)
POTASSIUM: 4.1 mmol/L (ref 3.5–5.1)
Potassium: 3.7 mmol/L (ref 3.5–5.1)
Potassium: 4.1 mmol/L (ref 3.5–5.1)
SODIUM: 136 mmol/L (ref 135–145)
SODIUM: 137 mmol/L (ref 135–145)
Sodium: 138 mmol/L (ref 135–145)
TCO2: 27 mmol/L (ref 22–32)
TCO2: 24 mmol/L (ref 22–32)
TCO2: 25 mmol/L (ref 22–32)

## 2018-04-21 LAB — CBC
HEMATOCRIT: 26.9 % — AB (ref 39.0–52.0)
HEMOGLOBIN: 8.4 g/dL — AB (ref 13.0–17.0)
MCH: 30.2 pg (ref 26.0–34.0)
MCHC: 31.2 g/dL (ref 30.0–36.0)
MCV: 96.8 fL (ref 78.0–100.0)
Platelets: 227 10*3/uL (ref 150–400)
RBC: 2.78 MIL/uL — ABNORMAL LOW (ref 4.22–5.81)
RDW: 15.9 % — ABNORMAL HIGH (ref 11.5–15.5)
WBC: 8.1 10*3/uL (ref 4.0–10.5)

## 2018-04-21 LAB — BPAM RBC
BLOOD PRODUCT EXPIRATION DATE: 201908282359
BLOOD PRODUCT EXPIRATION DATE: 201908282359
Blood Product Expiration Date: 201908282359
Blood Product Expiration Date: 201908292359
ISSUE DATE / TIME: 201908200825
ISSUE DATE / TIME: 201908200825
UNIT TYPE AND RH: 600
Unit Type and Rh: 600
Unit Type and Rh: 600
Unit Type and Rh: 600

## 2018-04-21 LAB — TYPE AND SCREEN
ABO/RH(D): A NEG
Antibody Screen: NEGATIVE
UNIT DIVISION: 0
UNIT DIVISION: 0
Unit division: 0
Unit division: 0

## 2018-04-21 LAB — BASIC METABOLIC PANEL
Anion gap: 5 (ref 5–15)
BUN: 23 mg/dL (ref 8–23)
CHLORIDE: 106 mmol/L (ref 98–111)
CO2: 26 mmol/L (ref 22–32)
Calcium: 8.6 mg/dL — ABNORMAL LOW (ref 8.9–10.3)
Creatinine, Ser: 0.98 mg/dL (ref 0.61–1.24)
GFR calc Af Amer: >60 mL/min (ref >60)
GFR calc non Af Amer: >60 mL/min (ref >60)
Glucose, Bld: 95 mg/dL (ref 70–99)
Potassium: 4.5 mmol/L (ref 3.5–5.1)
Sodium: 137 mmol/L (ref 135–145)

## 2018-04-21 LAB — POCT I-STAT 3, ART BLOOD GAS (G3+)
ACID-BASE EXCESS: 1 mmol/L (ref 0.0–2.0)
Bicarbonate: 26.2 mmol/L (ref 20.0–28.0)
O2 Saturation: 100 %
PO2 ART: 422 mmHg — AB (ref 83.0–108.0)
TCO2: 28 mmol/L (ref 22–32)
pCO2 arterial: 45.8 mmHg (ref 32.0–48.0)
pH, Arterial: 7.365 (ref 7.350–7.450)

## 2018-04-21 MED ORDER — METOLAZONE 5 MG PO TABS
2.5000 mg | ORAL_TABLET | Freq: Once | ORAL | Status: AC
  Administered 2018-04-21: 2.5 mg via ORAL
  Filled 2018-04-21: qty 1

## 2018-04-21 MED ORDER — FUROSEMIDE 40 MG PO TABS
40.0000 mg | ORAL_TABLET | Freq: Once | ORAL | Status: AC
  Administered 2018-04-21: 40 mg via ORAL
  Filled 2018-04-21: qty 1

## 2018-04-21 MED ORDER — POLYETHYLENE GLYCOL 3350 17 G PO PACK
17.0000 g | PACK | Freq: Every day | ORAL | Status: DC | PRN
  Administered 2018-04-21: 17 g via ORAL
  Filled 2018-04-21: qty 1

## 2018-04-21 NOTE — Progress Notes (Signed)
Physical Therapy Re-eval/Treatment Patient Details Name: Dalton Townsend MRN: [id number] DOB: [birth year] Today's Date: 04/21/2018    History of Present Illness Pt adm on 04/11/18 for planned TAVR. During attempted TAVR on 8/13 pt with vascular complications requiring right external iliac artery oversewing and emergent fem-fem bypass. Post op pt with acute on chronic heart failure. Pt had TAVR on 8/20.  PMH of CAD s/p CABG, severe AS, SSS s/p PPM placed 7/11, HTN, HL, anemia, and CVA .    PT Comments    Pt admitted with above diagnosis. Pt currently with functional limitations due to balance and endurance deficits.  Pt without much change after TAVR surgery.  Discussion with pt and family about need to use RW for safety initially and progress back to cane.  Pt and family in agreement.  HHPT can f/u with pt. Goals due and 4/4 unmet.  Revised goals and pt should progress well.  Will follow acutely.   Pt will benefit from skilled PT to increase their independence and safety with mobility to allow discharge to the venue listed below.     Follow Up Recommendations  Home health PT;Supervision for mobility/OOB     Equipment Recommendations  None recommended by PT    Recommendations for Other Services       Precautions / Restrictions Precautions Precautions: Fall Precaution Comments: pt reports 1 fall in past 2 years, it was 13 months ago following a 5 hour car drive Restrictions Weight Bearing Restrictions: No    Mobility  Bed Mobility               General bed mobility comments: in chair on arrival  Transfers Overall transfer level: Needs assistance Equipment used: Rolling walker (2 wheeled) Transfers: Sit to/from Stand Sit to Stand: Supervision         General transfer comment: supervision for safety  Ambulation/Gait Ambulation/Gait assistance: Min guard Gait Distance (Feet): 350 Feet Assistive device: Rolling walker (2 wheeled) Gait Pattern/deviations:  Shuffle;Step-through pattern;Trunk flexed;Decreased step length - right;Decreased step length - left Gait velocity: decreased Gait velocity interpretation: 1.31 - 2.62 ft/sec, indicative of limited community ambulator General Gait Details: Verbal cues to stand more erect and look up, also cues to stay close to RW.[name]  Pt wanted to try cane therefore tried cane withpt and pt was able to ambulate with cane but reaching for furniture witht he free hand.  Educated pt that RW would be safet initially and pt  and family agree.  Issued pt a gait belt and demonstrated to pt and family how to use.   Stairs             Wheelchair Mobility    Modified Rankin (Stroke Patients Only)       Balance Overall balance assessment: Mild deficits observed, not formally tested;Needs assistance Sitting-balance support: No upper extremity supported;Feet supported Sitting balance-Leahy Scale: Fair     Standing balance support: No upper extremity supported;During functional activity Standing balance-Leahy Scale: Fair Standing balance comment: can stand without Ue support statically for up to 30 seconds.                             Cognition Arousal/Alertness: Awake/alert Behavior During Therapy: WFL for tasks assessed/performed Overall Cognitive Status: Within Functional Limits for tasks assessed  Exercises      General Comments General comments (skin integrity, edema, etc.): Daughter and wife present throughout session.  discussed pt return to gym and that he should start with bike and not use his UEs initially until healed.       Pertinent Vitals/Pain Pain Assessment: No/denies pain  VSS   Home Living                      Prior Function            PT Goals (current goals can now be found in the care plan section) Acute Rehab PT Goals Patient Stated Goal: go home soon PT Goal Formulation: With patient Time For  Goal Achievement: 05/05/18 Potential to Achieve Goals: Good Progress towards PT goals: Progressing toward goals    Frequency    Min 3X/week      PT Plan Current plan remains appropriate    Co-evaluation              AM-PAC PT "6 Clicks" Daily Activity  Outcome Measure  Difficulty turning over in bed (including adjusting bedclothes, sheets and blankets)?: A Little Difficulty moving from lying on back to sitting on the side of the bed? : A Lot Difficulty sitting down on and standing up from a chair with arms (e.g., wheelchair, bedside commode, etc,.)?: A Little Help needed moving to and from a bed to chair (including a wheelchair)?: A Little Help needed walking in hospital room?: A Little Help needed climbing 3-5 steps with a railing? : A Little 6 Click Score: 17    End of Session Equipment Utilized During Treatment: Gait belt Activity Tolerance: Patient tolerated treatment well Patient left: with call bell/phone within reach;with family/visitor present;in chair Nurse Communication: Mobility status PT Visit Diagnosis: Muscle weakness (generalized) (M62.81);Other abnormalities of gait and mobility (R26.89)     Time: 8101-7510 PT Time Calculation (min) (ACUTE ONLY): 29 min  Charges:  $Gait Training: 8-22 mins                     [place] [phone] (pager)    Denice Paradise 04/21/2018, 1:09 PM

## 2018-04-21 NOTE — Care Management Important Message (Signed)
Important Message  Patient Details  Name: Dalton Townsend MRN: [id number] Date of Birth: 1922/12/31   Medicare Important Message Given:  Yes    Elvis Laufer P Raylin Diguglielmo 04/21/2018, 2:03 PM

## 2018-04-21 NOTE — Discharge Summary (Signed)
[place] VALVE TEAM   Discharge Summary    Patient ID: Dalton Townsend,  MRN: [id number], DOB/AGE: 1923-01-01 84 y.o.  Admit date: 04/11/2018 Discharge date: 04/22/2018  Primary Care Provider: Lajean Manes Primary Cardiologist: Dr. Marlou Porch / Dr. Burt Knack & Dr. Cyndia Bent (TAVR)   Discharge Diagnoses    Principal Problem:   S/P TAVR (transcatheter aortic valve replacement) Active Problems:   HTN (hypertension)   Ulcerative colitis ([place])   Severe aortic stenosis   Acute on chronic congestive heart failure (HCC)   Presence of permanent cardiac pacemaker   PAF (paroxysmal atrial fibrillation) (HCC)   Carotid artery disease (HCC)   CAD (coronary artery disease)   BPH (benign prostatic hyperplasia)   GAD (generalized anxiety disorder)   Aortic stenosis   S/P femoral-femoral bypass surgery   Allergies Allergies  Allergen Reactions  . No Known Allergies      History of Present Illness     Dalton Townsend is a 84 year old male with a history of CAD s/p CABG (1999), PAD s/p R CEA (2001) w/ 80-99% RICA occlusion now, hx of CVA, s/p PPM, HTN, PAF on Eliquis, ulcerative colitis and severe AS who presented to [facility] on 04/11/18 for planned TAVR.   He has known severe aortic stenosis that has been followed by Dr. Marlou Porch. An echocardiogram in 07/2016 showed severe aortic stenosis with a mean gradient of 47 mmHg. The patient was not having any symptoms at that time refused further work-up. He had a repeat echo in 05/2017 which showed no significant change in the mean gradient of 41 mmHg with normal left ventricular systolic function. The patient remained asymptomatic. Over the past year or so he has noted some exertional shortness of breath and fatigue but has not significantly affected his activity level. He said that he was going to the gym daily and working on aerobic exercise machines such as a recumbent bicycle. He had another echocardiogram on  12/20/2017 which showed no significant change in the severe aortic stenosis with a mean gradient of 40 mmHg and normal left ventricular ejection fraction of 55 to 60%. Then he noticed worsening shortness of breath after he woke up from a nap and could not walk to the bathroom without significant shortness of breath. His family called EMS and in the emergency department. He was admitted from 8/4-04/05/18 for acute diastolic CHF.Chest x-ray showed pulmonary edema. A repeat echocardiogram showed critical aortic stenosis with a mean gradient of 53 mmHg and a peak gradient of 88 mmHg. The dimensionless index was 0.26 with a calculated valve area of 0.73 cm. Left ventricular systolic function remain normal. He improved with intravenous diuresis. Cardiac cath showed patent bypass grafts. He was evaluated by the multidisciplinary valve team and set up for TAVR on 04/11/18  Hospital Course     Consultants: none  Severe AS: he underwent attempted right transfemoral TAVR on 2/40/97 that was complicated by injury to the right external iliac artery requiring emergent ligation of the external iliac artery and fem-fem bypass by Dr. Scot Dock. Follow up echocardiogram 04/13/18 showed persistent critical aortic stenosis. He was taken back for repeat TAVR via the transapical approach on 04/18/18. 26 mm Edwards Sapien 3 valve successfully deployed. Post op echo 04/19/18 showed a normally functioning TAVR valve with a mean gradient of 3 mm Hg and no PVL. He is currently on ASA 81 mg daily. Eliquis to be resumed 04/23/2018. Office visit set up for 04/17/18. Will get a  CXR at that time.   Fem-fem bypass: completed by Dr. Scot Dock on 04/11/18. Vascular following. Staples to be removed on an outpatient basis  Acute on chronic diastolic CHF: he has post op hypervolemia that required IV diuresis. He will be discharged on lasix 40mg  daily. BMET at follow up.  Carotid artery disease: pre TAVR dopplers showed 80-99% RICA stenosis  where he previously had R CEA. Evaluated by Dr. Bridgett Larsson prior to admission. Plan was for outpatient CT and follow up. He has an appointment with Dr. Scot Dock 05/03/18.  HTN: BP has remained in good control  PAF: continue Toprol XL. Eliquis will be resumed 04/23/2018  CAD s/p CABG x6V: pre TAVR cath showed patent 6/6 bypass grafts. Continue medical therapy.   Post operative anemia: Hg remains stable ~8.4. Continue to monitor   The patient has had an uncomplicated hospital course and is recovering well. The femoral catheter sites and chest wall incision sites are stable. He has been seen by Ellwood Handler PA-C today and deemed ready for discharge home. All follow-up appointments have been scheduled. Discharge medications are listed below.  _____________  Discharge Vitals Blood pressure (!) 164/95, pulse 79, temperature 97.7 F (36.5 C), temperature source Oral, resp. rate 17, height 5\' 11"  (1.803 m), weight 87.1 kg, SpO2 98 %.  Filed Weights   04/20/18 0600 04/21/18 0445 04/22/18 0327  Weight: 90 kg 88.5 kg 87.1 kg    Labs & Radiologic Studies     CBC Recent Labs    04/21/18 0438 04/22/18 0320  WBC 8.1 8.0  HGB 8.4* 9.0*  HCT 26.9* 28.7*  MCV 96.8 96.6  PLT 227 419   Basic Metabolic Panel Recent Labs    04/21/18 0438 04/22/18 0320  NA 137 138  K 4.5 4.1  CL 106 104  CO2 26 28  GLUCOSE 95 105*  BUN 23 26*  CREATININE 0.98 1.18  CALCIUM 8.6* 8.9   Liver Function Tests No results for input(s): AST, ALT, ALKPHOS, BILITOT, PROT, ALBUMIN in the last 72 hours. No results for input(s): LIPASE, AMYLASE in the last 72 hours. Cardiac Enzymes No results for input(s): CKTOTAL, CKMB, CKMBINDEX, TROPONINI in the last 72 hours. BNP Invalid input(s): POCBNP D-Dimer No results for input(s): DDIMER in the last 72 hours. Hemoglobin A1C No results for input(s): HGBA1C in the last 72 hours. Fasting Lipid Panel No results for input(s): CHOL, HDL, LDLCALC, TRIG, CHOLHDL, LDLDIRECT in  the last 72 hours. Thyroid Function Tests No results for input(s): TSH, T4TOTAL, T3FREE, THYROIDAB in the last 72 hours.  Invalid input(s): FREET3  Dg Chest 2 View  Result Date: 04/17/2018 CLINICAL DATA:  Preop examination. EXAM: CHEST - 2 VIEW COMPARISON:  Radiographs of April 13, 2018. FINDINGS: Stable cardiomediastinal silhouette. Status post coronary artery bypass graft. Atherosclerosis of thoracic aorta is noted. Left-sided pacemaker is unchanged in position. No pneumothorax or pleural effusion is noted. No acute pulmonary disease is noted. Bony thorax is unremarkable. IMPRESSION: No active cardiopulmonary disease. Aortic Atherosclerosis (ICD10-I70.0). Electronically Signed   By: Marijo Conception, M.D.   On: 04/17/2018 14:55   Ct Coronary Morph W/cta Cor W/score W/ca W/cm &/or Wo/cm  Addendum Date: 04/05/2018   ADDENDUM REPORT: 04/05/2018 14:16 CLINICAL DATA:  84 year old male with severe symptomatic aortic stenosis being evaluated for a TAVR procedure. EXAM: Cardiac TAVR CT TECHNIQUE: The patient was scanned on a [employer]. A 120 kV retrospective scan was triggered in the descending thoracic aorta at 111 HU's. Gantry rotation speed was 250  msecs and collimation was .6 mm. No beta blockade or nitro were given. The 3D data set was reconstructed in 5% intervals of the R-R cycle. Systolic and diastolic phases were analyzed on a dedicated work station using MPR, MIP and VRT modes. The patient received 80 cc of contrast. FINDINGS: Aortic Valve: Trileaflet aortic valve with severely thickened and calcified leaflets and moderately impaired leaflet opening. There are only trivial calcifications extending into the LVOT. Aorta: Normal size, mild calcifications in the ascending aorta, but severe calcifications and atheroma in the descending aorta. No dissection. Sinotubular Junction: 29 x 28 mm Ascending Thoracic Aorta: 33 x 32 mm Aortic Arch: 31 x 28 mm Descending Thoracic Aorta: 27 x 25 mm Sinus  of Valsalva Measurements: Non-coronary: 36 mm Right -coronary: 32 mm Left -coronary: 34 mm Coronary Artery Height above Annulus: Left Main: 12 mm Right Coronary: 14 mm Virtual Basal Annulus Measurements: Maximum/Minimum Diameter: 28.6 x 23.3 mm Mean Diameter: 24.7 mm Perimeter: 80 mm Area: 479 mm Optimum Fluoroscopic Angle for Delivery: LAO 2 CAU 2 IMPRESSION: 1. Trileaflet aortic valve with severely thickened and calcified leaflets and moderately impaired leaflet opening with only trivial calcifications extending into the LVOT. Annular measurements are suitable for delivery of a 26 mm Edwards-SAPIEN 3 valve. 2. Sufficient coronary to annulus distance. 3. Optimum Fluoroscopic Angle for Delivery:  LAO 2 CAU 2 4. No thrombus in the left atrial appendage. Electronically Signed   By: Ena Dawley   On: 04/05/2018 14:16   Result Date: 04/05/2018 EXAM: OVER-READ INTERPRETATION  CT CHEST The following report is an over-read performed by radiologist Dr. Vinnie Langton of [facility], [place] on 04/04/2018. This over-read does not include interpretation of cardiac or coronary anatomy or pathology. The coronary calcium score/coronary CTA interpretation by the cardiologist is attached. COMPARISON:  None. FINDINGS: Extracardiac findings will be described under dictation for contemporaneously obtained CTA chest, abdomen and pelvis. IMPRESSION: Please see separate dictation for contemporaneously obtained CTA chest, abdomen and pelvis dated 04/04/2018 for full description of relevant extracardiac findings. Electronically Signed: By: Vinnie Langton M.D. On: 04/04/2018 14:50   Dg Chest Port 1 View  Result Date: 04/20/2018 CLINICAL DATA:  Shortness of breath. EXAM: PORTABLE CHEST 1 VIEW COMPARISON:  Radiograph of April 19, 2017. FINDINGS: Stable cardiomegaly. Status post coronary artery bypass graft. Left-sided pacemaker is unchanged in position. No pneumothorax is noted. Status post transcatheter aortic valve  replacement. Stable bibasilar subsegmental atelectasis is noted with possible small pleural effusions. Bony thorax is unremarkable. IMPRESSION: Stable minimal bibasilar subsegmental atelectasis with possible minimal pleural effusions. Electronically Signed   By: Marijo Conception, M.D.   On: 04/20/2018 08:13   Dg Chest Port 1 View  Result Date: 04/19/2018 CLINICAL DATA:  Post TAVR EXAM: PORTABLE CHEST 1 VIEW COMPARISON:  04/18/2018 FINDINGS: Prior CABG and TAVR. Left pacer remains in place, unchanged. Mild cardiomegaly. Low lung volumes with bibasilar atelectasis. No effusions or pneumothorax. IMPRESSION: Low volumes, bibasilar atelectasis. Electronically Signed   By: Rolm Baptise M.D.   On: 04/19/2018 08:34   Dg Chest Port 1 View  Result Date: 04/18/2018 CLINICAL DATA:  Chest soreness, status post TAVR EXAM: PORTABLE CHEST 1 VIEW COMPARISON:  04/17/2018 FINDINGS: prior CABG. Changes of aortic valve repair. Left pacer remains in place, unchanged. Cardiomegaly with vascular congestion. Right mid lung atelectasis or scarring. Bibasilar atelectasis. IMPRESSION: Cardiomegaly, vascular congestion.  Bibasilar atelectasis. Electronically Signed   By: Rolm Baptise M.D.   On: 04/18/2018 11:28   Dg Chest [facility]  1 View  Result Date: 04/13/2018 CLINICAL DATA:  Sore chest. EXAM: PORTABLE CHEST 1 VIEW COMPARISON:  April 12, 2018 FINDINGS: No pneumothorax. Stable cardiomediastinal silhouette. Stable right central line. Mild opacity in left retrocardiac region is stable, possibly atelectasis. Mild atelectasis in the right mid lung. IMPRESSION: No acute interval change. Electronically Signed   By: Dorise Bullion III M.D   On: 04/13/2018 09:11   Dg Chest Port 1 View  Result Date: 04/12/2018 CLINICAL DATA:  Sore chest EXAM: PORTABLE CHEST 1 VIEW COMPARISON:  04/11/2018 FINDINGS: Prior CABG. Left pacer remains in place, unchanged. Interval removal of endotracheal tube and NG tube. Right central line is stable. Heart is  upper limits normal in size with bibasilar atelectasis and mild vascular congestion. No effusions or pneumothorax. IMPRESSION: Mild vascular congestion.  Bibasilar atelectasis. Electronically Signed   By: Rolm Baptise M.D.   On: 04/12/2018 09:35   Dg Chest Port 1 View  Result Date: 04/11/2018 CLINICAL DATA:  Status post aortic valvuloplasty. EXAM: PORTABLE CHEST 1 VIEW COMPARISON:  CT chest dated April 04, 2018. Chest x-ray dated April 02, 2018. FINDINGS: Endotracheal tube in place with the tip 2.9 cm above the carina. Enteric tube in the stomach. Right internal jugular central venous catheter with the tip in the mid SVC. Unchanged left chest wall pacemaker. Stable cardiomegaly status post CABG. Normal pulmonary vascularity. Improved aeration at the lung bases. Unchanged scarring in the right mid lung. No focal consolidation, pleural effusion, or pneumothorax. No acute osseous abnormality. IMPRESSION: 1. Appropriately positioned support apparatus. 2. Improved aeration at the lung bases. Electronically Signed   By: Titus Dubin M.D.   On: 04/11/2018 16:06   Dg Chest Port 1 View  Result Date: 04/02/2018 CLINICAL DATA:  Shortness of breath, wheezing. EXAM: PORTABLE CHEST 1 VIEW COMPARISON:  Chest radiograph March 19, 2017 FINDINGS: Cardiac silhouette is mildly enlarged unchanged. Calcified aortic arch. Status post median sternotomy for CABG. Increased interstitial prominence. Trace pleural effusions. Patchy RIGHT lung base airspace opacity. Bandlike density LEFT lung base. RIGHT mid lung zone scarring. No pneumothorax. LEFT cardiac pacemaker in situ. Osteopenia. IMPRESSION: Increasing interstitial prominence seen with atypical infection or pulmonary edema with RIGHT lung base consolidation. Trace pleural effusions. LEFT lung base atelectasis/scarring. Stable cardiomegaly. Aortic Atherosclerosis (ICD10-I70.0). Electronically Signed   By: Elon Alas M.D.   On: 04/02/2018 19:42   Dg Abd Portable  1v  Result Date: 04/11/2018 CLINICAL DATA:  84 year old male status post enteric tube placement. EXAM: PORTABLE ABDOMEN - 1 VIEW COMPARISON:  CTA abdomen and Pelvis 04/04/2018. FINDINGS: Portable AP supine view at 1550 hours. Enteric tube placed with side hole at the level of the gastric body in the left upper quadrant. Negative visible bowel gas pattern. Calcified aortic atherosclerosis. Lung base ventilation appears improved from the prior CT. Sequelae of CABG and cardiac pacemaker leads redemonstrated. No acute osseous abnormality identified. IMPRESSION: Enteric tube side hole at the level of the gastric body. Electronically Signed   By: Genevie Ann M.D.   On: 04/11/2018 16:07   Ct Angio Chest Aorta W/cm &/or Wo/cm  Result Date: 04/04/2018 CLINICAL DATA:  84 year old male with history of severe aortic stenosis. Preprocedural study prior to potential transcatheter aortic valve replacement (TAVR). EXAM: CT ANGIOGRAPHY CHEST, ABDOMEN AND PELVIS TECHNIQUE: Multidetector CT imaging through the chest, abdomen and pelvis was performed using the standard protocol during bolus administration of intravenous contrast. Multiplanar reconstructed images and MIPs were obtained and reviewed to evaluate the vascular anatomy. CONTRAST:  173mL  ISOVUE-370 IOPAMIDOL (ISOVUE-370) INJECTION 76% COMPARISON:  CT the abdomen and pelvis 05/14/2010. FINDINGS: CTA CHEST FINDINGS Cardiovascular: Heart size is mildly enlarged. There is no significant pericardial fluid, thickening or pericardial calcification. There is aortic atherosclerosis, as well as atherosclerosis of the great vessels of the mediastinum and the coronary arteries, including calcified atherosclerotic plaque in the left main, left anterior descending, left circumflex and right coronary arteries. Status post median sternotomy for CABG including LIMA to the LAD. Severe thickening calcification of the aortic valve. Left-sided pacemaker with lead tips terminating in the right  atrial appendage and near the right ventricular apex. Mediastinum/Lymph Nodes: No pathologically enlarged mediastinal or hilar lymph nodes. Esophagus is unremarkable in appearance. No axillary lymphadenopathy. Lungs/Pleura: Small right and trace left pleural effusions lying dependently. No acute consolidative airspace disease. Two tiny pulmonary nodules are noted in the right lung, largest of which measures 4 mm in the lateral segment of the right middle lobe (axial image 51 of series 15). No larger more suspicious appearing pulmonary nodules or masses are noted. Musculoskeletal/Soft Tissues: There are no aggressive appearing lytic or blastic lesions noted in the visualized portions of the skeleton. Median sternotomy wires. CTA ABDOMEN AND PELVIS FINDINGS Hepatobiliary: 11 mm low-attenuation lesion in segment 4A of the liver, compatible with a simple cyst. No other larger more suspicious appearing hepatic lesions are noted. No intra or extrahepatic biliary ductal dilatation. Gallbladder is moderately distended, but otherwise unremarkable in appearance. Pancreas: No pancreatic mass. No pancreatic ductal dilatation. No pancreatic or peripancreatic fluid or inflammatory changes. Spleen: Unremarkable. Adrenals/Urinary Tract: Multiple low-attenuation lesions in both kidneys, compatible with simple cysts, measuring up to 19 mm in diameter in the upper and lower poles of the right kidney. 7 mm nonobstructive calculus in the upper pole collecting system of the left kidney. No hydroureteronephrosis. Urinary bladder is unremarkable in appearance. Stomach/Bowel: Normal appearance of the stomach. No pathologic dilatation of small bowel or colon. A few scattered colonic diverticulae are noted, particularly in the sigmoid colon, without surrounding inflammatory changes to suggest an acute diverticulitis at this time. Normal appendix. Vascular/Lymphatic: Aortic atherosclerosis with vascular findings and measurements pertinent to  potential TAVR procedure, as detailed below. There is also aneurysmal dilatation of the left common iliac artery which measures up to 2.9 cm in diameter. Separate origin of the common hepatic artery directly off the aorta, with moderate ostial narrowing of this vessel where there is a mean diameter of only 5 mm. Remaining portions of the celiac axis are otherwise widely patent without hemodynamically significant stenosis. There is also moderate narrowing of the ostium of the superior mesenteric artery which also has a mean diameter 5 mm. Inferior mesenteric artery is widely patent without hemodynamically significant stenosis. Two right-sided renal arteries, the larger of which demonstrates very mild ostial narrowing. Single left renal artery is widely patent without hemodynamically significant stenosis. Reproductive: Prostate gland and seminal vesicles are unremarkable in appearance. Other: No significant volume of ascites.  No pneumoperitoneum. Musculoskeletal: There are no aggressive appearing lytic or blastic lesions noted in the visualized portions of the skeleton. VASCULAR MEASUREMENTS PERTINENT TO TAVR: AORTA: Minimal Aortic Diameter-18 x 16 mm Severity of Aortic Calcification-severe RIGHT PELVIS: Right Common Iliac Artery - Minimal Diameter-11.7 x 10.4 mm Tortuosity-moderate Calcification-moderate Right External Iliac Artery - Minimal Diameter-8.9 x 8.1 mm Tortuosity-severe Calcification-mild Right Common Femoral Artery - Minimal Diameter-7.8 x 6.6 mm Tortuosity-mild Calcification-severe LEFT PELVIS: Left Common Iliac Artery - Minimal Diameter-10.4 x 5.0 mm Tortuosity-severe Calcification-moderate Left External Iliac Artery -  Minimal Diameter-10.0 x 8.5 mm Tortuosity-severe Calcification-mild Left Common Femoral Artery - Minimal Diameter-7.1 x 8.3 mm Tortuosity-mild Calcification-severe Review of the MIP images confirms the above findings. IMPRESSION: 1. Vascular findings and measurements pertinent to  potential TAVR procedure, as detailed above. 2. Severe thickening calcification of the aortic valve, compatible with the reported clinical history of severe aortic stenosis. 3. Aortic atherosclerosis, in addition to left main and 3 vessel coronary artery disease. Status post median sternotomy for CABG including LIMA to the LAD. 4. 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 5. Moderate ostial narrowing of the common hepatic artery (direct origin from the abdominal aorta) and the superior mesenteric artery, both of which measure 5 mm in diameter. 6. Colonic diverticulosis without evidence of acute diverticulitis at this time. 7. 7 mm nonobstructive calculus in the upper pole collecting system of left kidney. 8. Small right and trace left pleural effusions lying dependently. 9. Small pulmonary nodules in the right lung, largest of which measures only 4 mm in the right middle lobe. No follow-up needed if patient is low-risk (and has no known or suspected primary neoplasm). Non-contrast chest CT can be considered in 12 months if patient is high-risk. This recommendation follows the consensus statement: Guidelines for Management of Incidental Pulmonary Nodules Detected on CT Images: From the Fleischner Society 2017; Radiology 2017; 284:228-243. 10. Additional incidental findings, as above. Aortic Atherosclerosis (ICD10-I70.0). Electronically Signed   By: Vinnie Langton M.D.   On: 04/04/2018 18:11   Ct Angio Abd/pel W/ And/or W/o  Result Date: 04/04/2018 CLINICAL DATA:  84 year old male with history of severe aortic stenosis. Preprocedural study prior to potential transcatheter aortic valve replacement (TAVR). EXAM: CT ANGIOGRAPHY CHEST, ABDOMEN AND PELVIS TECHNIQUE: Multidetector CT imaging through the chest, abdomen and pelvis was performed using the standard protocol during bolus administration of intravenous contrast. Multiplanar reconstructed images and MIPs  were obtained and reviewed to evaluate the vascular anatomy. CONTRAST:  113mL ISOVUE-370 IOPAMIDOL (ISOVUE-370) INJECTION 76% COMPARISON:  CT the abdomen and pelvis 05/14/2010. FINDINGS: CTA CHEST FINDINGS Cardiovascular: Heart size is mildly enlarged. There is no significant pericardial fluid, thickening or pericardial calcification. There is aortic atherosclerosis, as well as atherosclerosis of the great vessels of the mediastinum and the coronary arteries, including calcified atherosclerotic plaque in the left main, left anterior descending, left circumflex and right coronary arteries. Status post median sternotomy for CABG including LIMA to the LAD. Severe thickening calcification of the aortic valve. Left-sided pacemaker with lead tips terminating in the right atrial appendage and near the right ventricular apex. Mediastinum/Lymph Nodes: No pathologically enlarged mediastinal or hilar lymph nodes. Esophagus is unremarkable in appearance. No axillary lymphadenopathy. Lungs/Pleura: Small right and trace left pleural effusions lying dependently. No acute consolidative airspace disease. Two tiny pulmonary nodules are noted in the right lung, largest of which measures 4 mm in the lateral segment of the right middle lobe (axial image 51 of series 15). No larger more suspicious appearing pulmonary nodules or masses are noted. Musculoskeletal/Soft Tissues: There are no aggressive appearing lytic or blastic lesions noted in the visualized portions of the skeleton. Median sternotomy wires. CTA ABDOMEN AND PELVIS FINDINGS Hepatobiliary: 11 mm low-attenuation lesion in segment 4A of the liver, compatible with a simple cyst. No other larger more suspicious appearing hepatic lesions are noted. No intra or extrahepatic biliary ductal dilatation. Gallbladder is moderately distended, but otherwise unremarkable in appearance. Pancreas: No pancreatic mass. No pancreatic ductal dilatation. No pancreatic  or peripancreatic fluid or  inflammatory changes. Spleen: Unremarkable. Adrenals/Urinary Tract: Multiple low-attenuation lesions in both kidneys, compatible with simple cysts, measuring up to 19 mm in diameter in the upper and lower poles of the right kidney. 7 mm nonobstructive calculus in the upper pole collecting system of the left kidney. No hydroureteronephrosis. Urinary bladder is unremarkable in appearance. Stomach/Bowel: Normal appearance of the stomach. No pathologic dilatation of small bowel or colon. A few scattered colonic diverticulae are noted, particularly in the sigmoid colon, without surrounding inflammatory changes to suggest an acute diverticulitis at this time. Normal appendix. Vascular/Lymphatic: Aortic atherosclerosis with vascular findings and measurements pertinent to potential TAVR procedure, as detailed below. There is also aneurysmal dilatation of the left common iliac artery which measures up to 2.9 cm in diameter. Separate origin of the common hepatic artery directly off the aorta, with moderate ostial narrowing of this vessel where there is a mean diameter of only 5 mm. Remaining portions of the celiac axis are otherwise widely patent without hemodynamically significant stenosis. There is also moderate narrowing of the ostium of the superior mesenteric artery which also has a mean diameter 5 mm. Inferior mesenteric artery is widely patent without hemodynamically significant stenosis. Two right-sided renal arteries, the larger of which demonstrates very mild ostial narrowing. Single left renal artery is widely patent without hemodynamically significant stenosis. Reproductive: Prostate gland and seminal vesicles are unremarkable in appearance. Other: No significant volume of ascites.  No pneumoperitoneum. Musculoskeletal: There are no aggressive appearing lytic or blastic lesions noted in the visualized portions of the skeleton. VASCULAR MEASUREMENTS PERTINENT TO TAVR: AORTA: Minimal Aortic Diameter-18 x 16 mm  Severity of Aortic Calcification-severe RIGHT PELVIS: Right Common Iliac Artery - Minimal Diameter-11.7 x 10.4 mm Tortuosity-moderate Calcification-moderate Right External Iliac Artery - Minimal Diameter-8.9 x 8.1 mm Tortuosity-severe Calcification-mild Right Common Femoral Artery - Minimal Diameter-7.8 x 6.6 mm Tortuosity-mild Calcification-severe LEFT PELVIS: Left Common Iliac Artery - Minimal Diameter-10.4 x 5.0 mm Tortuosity-severe Calcification-moderate Left External Iliac Artery - Minimal Diameter-10.0 x 8.5 mm Tortuosity-severe Calcification-mild Left Common Femoral Artery - Minimal Diameter-7.1 x 8.3 mm Tortuosity-mild Calcification-severe Review of the MIP images confirms the above findings. IMPRESSION: 1. Vascular findings and measurements pertinent to potential TAVR procedure, as detailed above. 2. Severe thickening calcification of the aortic valve, compatible with the reported clinical history of severe aortic stenosis. 3. Aortic atherosclerosis, in addition to left main and 3 vessel coronary artery disease. Status post median sternotomy for CABG including LIMA to the LAD. 4. 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 5. Moderate ostial narrowing of the common hepatic artery (direct origin from the abdominal aorta) and the superior mesenteric artery, both of which measure 5 mm in diameter. 6. Colonic diverticulosis without evidence of acute diverticulitis at this time. 7. 7 mm nonobstructive calculus in the upper pole collecting system of left kidney. 8. Small right and trace left pleural effusions lying dependently. 9. Small pulmonary nodules in the right lung, largest of which measures only 4 mm in the right middle lobe. No follow-up needed if patient is low-risk (and has no known or suspected primary neoplasm). Non-contrast chest CT can be considered in 12 months if patient is high-risk. This recommendation follows the consensus statement:  Guidelines for Management of Incidental Pulmonary Nodules Detected on CT Images: From the Fleischner Society 2017; Radiology 2017; 284:228-243. 10. Additional incidental findings, as above. Aortic Atherosclerosis (ICD10-I70.0). Electronically Signed   By: Mauri Brooklyn.D.  On: 04/04/2018 18:11   Dg Or Local Abdomen  Result Date: 04/11/2018 CLINICAL DATA:  TAVR. Right external iliac artery and vein injury requiring open repair. EXAM: OR LOCAL ABDOMEN COMPARISON:  CT abdomen pelvis dated April 04, 2018. FINDINGS: No radiopaque foreign body. Surgical clips noted overlying the right acetabulum and left ischium. Skin staples in both groins. Small amount of contrast within the bladder. Visualized bowel gas pattern is nonobstructive. No acute osseous abnormality. IMPRESSION: No radiopaque foreign body identified. These results were called by telephone at the time of interpretation on 04/11/2018 at 3:52 pm to [facility], RN, who verbally acknowledged these results. Electronically Signed   By: Titus Dubin M.D.   On: 04/11/2018 15:54     Diagnostic Studies/Procedures    TAVR OPERATIVE NOTE   Date of Procedure:04/11/2018  Preoperative Diagnosis:Severe Aortic Stenosis  Procedure: ? Balloon aortic valvuloplasty  Co-Surgeons:Clarence H. Roxy Manns, MD and Sherren Mocha, MD  Pre-operative Echo Findings: ? Severe aortic stenosis ? Normalleft ventricular systolic function  _______________  2D ECHO 04/13/18: Study Conclusions - Left ventricle: The cavity size was normal. Systolic function was normal. The estimated ejection fraction was in the range of 60% to 65%. Wall motion was normal; there were no regional wall motion abnormalities. Doppler parameters are consistent with abnormal left ventricular relaxation (grade 1 diastolic dysfunction). Doppler parameters are consistent with elevated ventricular end-diastolic filling  pressure. - Aortic valve: There was critical stenosis. There was mild regurgitation. Valve area (VTI): 0.55 cm^2. Valve area (Vmean): 0.54 cm^2. - Mitral valve: Calcified annulus. Mildly thickened leaflets . There was mild regurgitation. - Left atrium: The atrium was moderately dilated. - Right atrium: The atrium was moderately dilated. - Tricuspid valve: There was moderate regurgitation. - Pulmonary arteries: Systolic pressure was mildly increased. PA peak pressure: 39 mm Hg (S). - Inferior vena cava: The vessel was normal in size. Impressions: - Critical aortic stenosis with peak/mean gradients 99/65 mmHg. Preserved LVEF 60-65%.   _______________   TAVR OPERATIVE NOTE   Date of Procedure:04/18/2018  Preoperative Diagnosis:Severe Aortic Stenosis   Procedure:   Transcatheter Aortic Valve Replacement - Tanna Furry Sapien 3 THV (size 43mm, model # 9600TFX, serial # [id number])  Co-Surgeons:Bryan Alveria Apley, MD and Sherren Mocha, MD  Assisting: Darylene Price, MD  Pre-operative Echo Findings: ? Criticalaortic stenosis ? Normalleft ventricular systolic function  Post-operative Echo Findings: ? Mildparavalvular leak ? Normalleft ventricular systolic function  _______________  2D ECHO 04/19/18 Study Conclusions - Left ventricle: The cavity size was normal. There was mild focal basal hypertrophy of the septum. Systolic function was normal. The estimated ejection fraction was in the range of 55% to 60%. Wall motion was normal; there were no regional wall motion abnormalities. Doppler parameters are consistent with abnormal left ventricular relaxation (grade 1 diastolic dysfunction). - Ventricular septum: Septal motion showed abnormal function and dyssynergy. - Aortic valve: Transcatheter Aortic Valve Replacement - Transapical Approach, Edwards Sapien  3 THV (size 26 mm, model # 9600TFX, serial # [id number]) Peak velocity (S): 125 cm/s. Mean gradient (S): 3 mm Hg. Valve area (VTI): 2.87 cm^2. Valve area (Vmax): 2.7 cm^2. Valve area (Vmean): 2.57 cm^2. - Right ventricle: Pacer wire or catheter noted in right ventricle. - Tricuspid valve: There was moderate regurgitation. - Pulmonary arteries: Systolic pressure was moderately increased. PA peak pressure: 49 mm Hg (S).    Disposition   Pt is being discharged home today in good condition.  Follow-up Plans & Appointments    Follow-up Information    Scot Dock,  Judeth Cornfield, MD In 2 weeks.   Specialties:  Vascular Surgery, Cardiology Why:  Office will call you to arrange your appt (sent) Contact information: [place] [address] [place], [facility] Follow up.   Specialty:  [place] Why:  Vascular office referral for any [facility] needs- they will call you post op  Contact information: [place] [address] [phone]        Eileen Stanford, PA-C. Go on 04/27/2018.   Specialties:  Cardiology, Radiology Why:  @ 1:30pm.  Contact information: [place] [phone]            Discharge Medications     Medication List    STOP taking these medications   amLODipine 5 MG tablet Commonly known as:  NORVASC     TAKE these medications   acetaminophen 500 MG tablet Commonly known as:  TYLENOL Take 1,000 mg by mouth at bedtime.   amoxicillin 500 MG capsule Commonly known as:  AMOXIL Take 2,000 mg by mouth See admin instructions. Take 4 capsules (2000 mg) by mouth one hour prior to dental appointments   apixaban 5 MG Tabs tablet Commonly known as:  ELIQUIS Take 1 tablet (5 mg total) by mouth 2 (two) times daily. Start taking on:  04/23/2018   aspirin 81 MG chewable tablet Chew 1 tablet (81 mg total) by mouth daily.   balsalazide 750 MG  capsule Commonly known as:  COLAZAL Take 2,250 mg by mouth 3 (three) times daily.   escitalopram 10 MG tablet Commonly known as:  LEXAPRO Take 10 mg by mouth daily.   finasteride 5 MG tablet Commonly known as:  PROSCAR Take 5 mg by mouth See admin instructions. Take one tablet (5 mg) by mouth every day except Wednesdays   furosemide 40 MG tablet Commonly known as:  LASIX Take 1 tablet (40 mg total) by mouth daily. What changed:  when to take this   LIPITOR 20 MG tablet Generic drug:  atorvastatin Take 20 mg by mouth every other day.   mesalamine 1000 MG suppository Commonly known as:  CANASA Place 1,000 mg rectally at bedtime as needed (ulcerative colitis).   metoprolol succinate 50 MG 24 hr tablet Commonly known as:  TOPROL-XL Take 50 mg by mouth daily. Take with or immediately following a meal.   multivitamin with minerals Tabs tablet Take 1 tablet by mouth daily.   potassium chloride 10 MEQ CR tablet Commonly known as:  KLOR-CON Take 2 tablets (20 mEq total) by mouth 2 (two) times daily. To be taken with lasix   XALATAN 0.005 % ophthalmic solution Generic drug:  latanoprost Place 1 drop into both eyes at bedtime.         Outstanding Labs/Studies    Duration of Discharge Encounter   Greater than 30 minutes including physician time.  Signed, Batu Cassin PA-C 04/22/2018, 8:05 AM

## 2018-04-21 NOTE — Progress Notes (Addendum)
[facility]  VASCULAR SURGERY ASSESSMENT & PLAN:   POSTOP DAY 10 STATUS POST LEFT TO RIGHT FEMOROFEMORAL BYPASS: Given that his nutritional status may not be optimal I am reluctant to remove his staples quite yet.  The left groin wound needs careful attention and I have written for twice daily dry dressing changes.  I will arrange to remove his staples in the office next week.  He is scheduled to see me on September 4 I believe to discuss a carotid stenosis that had previously been evaluated by Dr. Adele Barthel.  Given that he is asymptomatic from the standpoint I would be reluctant to pursue surgery in the near future but would prefer to wait until he has recovered from his current hospitalization to reconsider this.  Deitra Mayo, MD, FACS Beeper [phone] Office: [phone]   Subjective  - Doing well over all, no new compalints in B LE.   Objective (!) 167/81 82 98.2 F (36.8 C) (Oral) (!) 24 98%  Intake/Output Summary (Last 24 hours) at 04/21/2018 0738 Last data filed at 04/21/2018 0437 Gross per 24 hour  Intake 720 ml  Output 1700 ml  Net -980 ml    Doppler DP/PT B LE Palpable graft pulse fem-fem, staples intact, groins soft without hematoma Lungs non labored breathing Heart Paced rhythm Gen NAD   Assessment/Planning: POD # 10  1. Repair of right external iliac vein and oversewing of right external iliac artery (Dr. Roxy Manns) 2.Endarterectomy of right common femoral artery and left to right femoral-femoral bypass with 8 mm Dacron graft (Dr. Scot Dock)  Transcatheter Aortic Valve Replacement - Transapical Approach 04/18/2018 Patent bypass Pending discharge home we will likely remove the staples prior to discharge He has a f/u visit on 05/03/2018 with DR. Dickson Right ICA 80-99 % stenosis which will be discussed at that time as well.   Roxy Horseman 04/21/2018 7:38 AM --  Laboratory Lab Results: Recent Labs    04/20/18 0145  04/21/18 0438  WBC 10.5 8.1  HGB 8.6* 8.4*  HCT 28.2* 26.9*  PLT 231 227   BMET Recent Labs    04/20/18 0145 04/21/18 0438  NA 138 137  K 3.9 4.5  CL 106 106  CO2 27 26  GLUCOSE 113* 95  BUN 24* 23  CREATININE 1.11 0.98  CALCIUM 8.3* 8.6*    COAG Lab Results  Component Value Date   INR 1.08 04/17/2018   INR 1.38 04/11/2018   INR 1.70 04/11/2018   No results found for: PTT

## 2018-04-21 NOTE — Progress Notes (Signed)
CARDIAC REHAB PHASE I   PRE:  Rate/Rhythm: 95 paced  BP:  Sitting: 150/66      SaO2: 96 RA  MODE:  Ambulation: 270 ft 114 peak HR  POST:  Rate/Rhythm: 97 paced  BP:  Sitting: 160/78    SaO2: 93 RA   Pt ambulated 264ft min assist with front wheel walker. Pt c/o slight SOB. Still walking hunched over, seems to be improved some from last week. Requested PT consult for poss home PT. Pt and daughter agreeable. Will continue to follow.  [phone] Rufina Falco, RN BSN 04/21/2018 9:54 AM

## 2018-04-21 NOTE — Telephone Encounter (Signed)
sch appt lvm 04/27/18 3pm Carotid 4pm staple removal

## 2018-04-21 NOTE — Progress Notes (Addendum)
[place] VALVE TEAM  Patient Name: Dalton Townsend Date of Encounter: 04/21/2018  Primary Cardiologist: Dr. Marlou Porch / Dr. Burt Knack & Dr. Cyndia Bent (TAVR)  Hospital Problem List     Principal Problem:   S/P TAVR (transcatheter aortic valve replacement) Active Problems:   HTN (hypertension)   Ulcerative colitis ([place])   Severe aortic stenosis   Acute on chronic congestive heart failure (HCC)   Presence of permanent cardiac pacemaker   PAF (paroxysmal atrial fibrillation) (HCC)   Carotid artery disease (HCC)   CAD (coronary artery disease)   BPH (benign prostatic hyperplasia)   GAD (generalized anxiety disorder)   Aortic stenosis   S/P femoral-femoral bypass surgery     Subjective   No complaints. No SOB. Feeling well. Ready for surgery tomorrow. Son in Sports coach in room. All questions answered.   Inpatient Medications    Scheduled Meds: . aspirin  81 mg Oral Daily  . atorvastatin  20 mg Oral QODAY  . Chlorhexidine Gluconate Cloth  6 each Topical Daily  . docusate sodium  100 mg Oral Daily  . escitalopram  10 mg Oral Daily  . folic acid-pyridoxine-cyancobalamin  1 tablet Oral Daily  . furosemide  40 mg Oral Daily  . iron polysaccharides  150 mg Oral Daily  . metolazone  2.5 mg Oral Once  . metoprolol succinate  50 mg Oral Daily  . potassium chloride  20 mEq Oral BID  . sodium chloride flush  10-40 mL Intracatheter Q12H  . sodium chloride flush  3 mL Intravenous Q12H   Continuous Infusions: . sodium chloride     PRN Meds: sodium chloride, acetaminophen **OR** [DISCONTINUED] acetaminophen, ondansetron (ZOFRAN) IV, oxyCODONE, polyethylene glycol, sodium chloride flush, sodium chloride flush, traMADol   Vital Signs    Vitals:   04/21/18 0432 04/21/18 0437 04/21/18 0445 04/21/18 0742  BP:  (!) 167/81  (!) 156/72  Pulse:  82    Resp:  17 (!) 24 20  Temp: 98.2 F (36.8 C)   98.4 F (36.9 C)  TempSrc: Oral   Oral  SpO2:  98%   94%  Weight:   88.5 kg   Height:        Intake/Output Summary (Last 24 hours) at 04/21/2018 1005 Last data filed at 04/21/2018 0840 Gross per 24 hour  Intake 598 ml  Output 1875 ml  Net -1277 ml   Filed Weights   04/19/18 0600 04/20/18 0600 04/21/18 0445  Weight: 89.2 kg 90 kg 88.5 kg    Physical Exam   GEN: Well nourished, well developed, in no acute distress.  HEENT: Grossly normal.  Neck: Supple, mild JVD. No masses. Cardiac:  RRR, + harsh systolic murmur. No rubs, or gallops. No clubbing, cyanosis. 2 + LE edema Radials/DP/PT 2+ and equal bilaterally.  Respiratory:: decreased breath sounds at bases GI: Soft, nontender, nondistended, BS + x 4. MS: no deformity or atrophy. Skin: warm and dry, no rash. Dressings intact. Neuro:  Strength and sensation are intact. Psych: AAOx3.  Normal affect.  Labs    CBC Recent Labs    04/20/18 0145 04/21/18 0438  WBC 10.5 8.1  HGB 8.6* 8.4*  HCT 28.2* 26.9*  MCV 97.9 96.8  PLT 231 409   Basic Metabolic Panel Recent Labs    04/19/18 0503 04/20/18 0145 04/21/18 0438  NA 135 138 137  K 4.7 3.9 4.5  CL 107 106 106  CO2 24 27 26   GLUCOSE 150* 113* 95  BUN 24* 24* 23  CREATININE 1.02 1.11 0.98  CALCIUM 8.2* 8.3* 8.6*  MG 2.1  --   --    Liver Function Tests No results for input(s): AST, ALT, ALKPHOS, BILITOT, PROT, ALBUMIN in the last 72 hours. No results for input(s): LIPASE, AMYLASE in the last 72 hours. Cardiac Enzymes No results for input(s): CKTOTAL, CKMB, CKMBINDEX, TROPONINI in the last 72 hours. BNP Invalid input(s): POCBNP D-Dimer No results for input(s): DDIMER in the last 72 hours. Hemoglobin A1C No results for input(s): HGBA1C in the last 72 hours. Fasting Lipid Panel No results for input(s): CHOL, HDL, LDLCALC, TRIG, CHOLHDL, LDLDIRECT in the last 72 hours. Thyroid Function Tests No results for input(s): TSH, T4TOTAL, T3FREE, THYROIDAB in the last 72 hours.  Invalid input(s): FREET3  Telemetry    V  paced, PVCs - Personally Reviewed  ECG    AV paced - Personally Reviewed  Radiology    Dg Chest Port 1 View  Result Date: 04/20/2018 CLINICAL DATA:  Shortness of breath. EXAM: PORTABLE CHEST 1 VIEW COMPARISON:  Radiograph of April 19, 2017. FINDINGS: Stable cardiomegaly. Status post coronary artery bypass graft. Left-sided pacemaker is unchanged in position. No pneumothorax is noted. Status post transcatheter aortic valve replacement. Stable bibasilar subsegmental atelectasis is noted with possible small pleural effusions. Bony thorax is unremarkable. IMPRESSION: Stable minimal bibasilar subsegmental atelectasis with possible minimal pleural effusions. Electronically Signed   By: Marijo Conception, M.D.   On: 04/20/2018 08:13    Cardiac Studies   TAVR OPERATIVE NOTE   Date of Procedure:                04/11/2018  Preoperative Diagnosis:      Severe Aortic Stenosis  Procedure:      ? Balloon aortic valvuloplasty              Co-Surgeons:                        Valentina Gu. Roxy Manns, MD and Sherren Mocha, MD  Pre-operative Echo Findings: ? Severe aortic stenosis ? Normal left ventricular systolic function  _______________  2D ECHO 04/13/18: Study Conclusions - Left ventricle: The cavity size was normal. Systolic function was   normal. The estimated ejection fraction was in the range of 60%   to 65%. Wall motion was normal; there were no regional wall   motion abnormalities. Doppler parameters are consistent with   abnormal left ventricular relaxation (grade 1 diastolic   dysfunction). Doppler parameters are consistent with elevated   ventricular end-diastolic filling pressure. - Aortic valve: There was critical stenosis. There was mild   regurgitation. Valve area (VTI): 0.55 cm^2. Valve area (Vmean):   0.54 cm^2. - Mitral valve: Calcified annulus. Mildly thickened leaflets .   There was mild regurgitation. - Left atrium: The atrium was moderately dilated. - Right atrium:  The atrium was moderately dilated. - Tricuspid valve: There was moderate regurgitation. - Pulmonary arteries: Systolic pressure was mildly increased. PA   peak pressure: 39 mm Hg (S). - Inferior vena cava: The vessel was normal in size. Impressions: - Critical aortic stenosis with peak/mean gradients 99/65 mmHg.   Preserved LVEF 60-65%.   _______________   TAVR OPERATIVE NOTE   Date of Procedure:                04/18/2018  Preoperative Diagnosis:      Severe Aortic Stenosis   Procedure:  Transcatheter Aortic Valve Replacement -  Transapical Approach             Edwards Sapien 3 THV (size 26 mm, model # 9600TFX, serial # [id number])              Co-Surgeons:                        Gaye Pollack, MD and Sherren Mocha, MD  Assisting: Darylene Price, MD  Pre-operative Echo Findings: ? Critical aortic stenosis ? Normal left ventricular systolic function  Post-operative Echo Findings: ? Mild paravalvular leak ? Normal left ventricular systolic function  _______________  2D ECHO 04/19/18 Study Conclusions - Left ventricle: The cavity size was normal. There was mild focal   basal hypertrophy of the septum. Systolic function was normal.   The estimated ejection fraction was in the range of 55% to 60%.   Wall motion was normal; there were no regional wall motion   abnormalities. Doppler parameters are consistent with abnormal   left ventricular relaxation (grade 1 diastolic dysfunction). - Ventricular septum: Septal motion showed abnormal function and   dyssynergy. - Aortic valve: Transcatheter Aortic Valve Replacement -   Transapical Approach, Edwards Sapien 3 THV (size 26 mm, model #   9600TFX, serial # [id number]) Peak velocity (S): 125 cm/s. Mean   gradient (S): 3 mm Hg. Valve area (VTI): 2.87 cm^2. Valve area   (Vmax): 2.7 cm^2. Valve area (Vmean): 2.57 cm^2. - Right ventricle: Pacer wire or catheter noted in right ventricle. - Tricuspid valve: There was  moderate regurgitation. - Pulmonary arteries: Systolic pressure was moderately increased.   PA peak pressure: 49 mm Hg (S).    Patient Profile      Dalton Townsend is a 84 year old male with a history of CAD s/p CABG (1999), PAD s/p R CEA (2001) w/ 80-99% RICA occlusion now, hx of CVA, s/p PPM, HTN, PAF on Eliquis, ulcerative colitis and severe AS who presented to [facility]18 for planned TAVR.   Assessment & Plan   Severe AS: during TAVR procedure on 04/11/18 there was a vascular site complication that required right external iliac artery oversewing and emergent fem-fem bypass by Dr. Scot Dock. TAVR valve was not able to be deployed but he did get a BAV. Echocardiogram 04/13/18 show persistent critical aortic stenosis. He was taken back for repeat TAVR via the transapical approach on 04/25/18. 26 mm Edwards Sapien 3 valve successfully deployed. Post op echo shows a normally functioning TAVR valve with a mean gradient of 3 mm Hg and no PVL. He is currently on ASA 81 mg daily. Will discuss with Dr. Cyndia Bent when to resume home Eliquis. He has had some post op volume overload that is responding well to diuresis. Still ~10 lbs up from admission. Plan for continued diuresis today with tentative discharge tomorrow. Office visit set up for 04/17/18.  Fem-fem bypass: completed by Dr. Scot Dock on 04/11/18. Vascular following. Staples to be removed prior to discharge.   Acute on chronic diastolic CHF: he has post op hypervolemia that is responding well to po lasix 62m daily. Metolazone given yesterday and ordered again for today. Baseline ~ 184 lbs. He still has evidence of volume overload with LE edema, JVD and decreased breath sounds at bases. Creat remains normal. He will need to be discharged on lasix 479mdaily.   Carotid artery disease: pre TAVR dopplers showed 80-99% RICA stenosis where he previously had R CEA. Evaluated by  Dr. Bridgett Larsson prior to admission. Plan was for outpatient CT and follow up. He has an  appointment with Dr. Scot Dock 05/03/18.  HTN: BP well controlled currently.    PAF: tele shows paced rhythm. Eliquis on hold given recent vascular surgery and TAVR via transapical approach. Will discuss with Dr. Cyndia Bent when to resume this.  CAD s/p CABG x6V: pre TAVR cath showed patent 6/6 bypass grafts. Continue medical therapy.   Post operative anemia: Hg remains stable ~8.4. Continue to monitor   Signed, Angelena Form, PA-C  04/21/2018, 10:05 AM  Pager [phone]   Chart reviewed, patient examined, agree with above. He says that he has ambulated twice and feels fine. Still has some scrotal and LE edema. Diuresed well yesterday and weight down 3 lbs but still 11 lbs over admission. Will give him another dose of lasix this afternoon since BP is good and plan to send home tomorrow on daily lasix and Kdur until we see him back.  Dr. Scot Dock is planning to leave groin staples in and remove in his office. I will need to see him in a week with a CXR.

## 2018-04-22 DIAGNOSIS — Z952 Presence of prosthetic heart valve: Secondary | ICD-10-CM

## 2018-04-22 LAB — BASIC METABOLIC PANEL
Anion gap: 6 (ref 5–15)
BUN: 26 mg/dL — AB (ref 8–23)
CHLORIDE: 104 mmol/L (ref 98–111)
CO2: 28 mmol/L (ref 22–32)
Calcium: 8.9 mg/dL (ref 8.9–10.3)
Creatinine, Ser: 1.18 mg/dL (ref 0.61–1.24)
GFR calc Af Amer: 60 mL/min — ABNORMAL LOW (ref >60)
GFR, EST NON AFRICAN AMERICAN: 52 mL/min — AB (ref >60)
GLUCOSE: 105 mg/dL — AB (ref 70–99)
Potassium: 4.1 mmol/L (ref 3.5–5.1)
Sodium: 138 mmol/L (ref 135–145)

## 2018-04-22 LAB — CBC
HEMATOCRIT: 28.7 % — AB (ref 39.0–52.0)
Hemoglobin: 9 g/dL — ABNORMAL LOW (ref 13.0–17.0)
MCH: 30.3 pg (ref 26.0–34.0)
MCHC: 31.4 g/dL (ref 30.0–36.0)
MCV: 96.6 fL (ref 78.0–100.0)
PLATELETS: 184 10*3/uL (ref 150–400)
RBC: 2.97 MIL/uL — ABNORMAL LOW (ref 4.22–5.81)
RDW: 15.9 % — ABNORMAL HIGH (ref 11.5–15.5)
WBC: 8 10*3/uL (ref 4.0–10.5)

## 2018-04-22 MED ORDER — APIXABAN 5 MG PO TABS: 5.0000 mg | ORAL_TABLET | Freq: Two times a day (BID) | ORAL | Status: DC

## 2018-04-22 MED ORDER — ASPIRIN 81 MG PO CHEW: 81.0000 mg | CHEWABLE_TABLET | Freq: Every day | ORAL | Status: DC

## 2018-04-22 MED ORDER — FUROSEMIDE 40 MG PO TABS: 40.0000 mg | ORAL_TABLET | Freq: Every day | ORAL | 1 refills | Status: DC

## 2018-04-22 NOTE — Care Management Note (Signed)
Case Management Note  Patient Details  Name: Dalton Townsend MRN: [id number] Date of Birth: 15-Oct-1922  Subjective/Objective:                    Action/Plan:  Spoke w patient wife and family at bedside. Provided w 30 day Eliquis card as patient does not remember ever using one. Discussed DME, patient does not need additional DME at home, discusses [facility] and decided to add RN. Updated Sharyn Lull w [facility] and notified of DC today Expected Discharge Date:                  Expected Discharge Plan:  [place]  In-House Referral:     Discharge planning Services  CM Consult  Post Acute Care Choice:    Choice offered to:     DME Arranged:    DME Agency:     HH Arranged:  RN, PT HH Agency:  [facility]  Status of Service:  Completed, signed off  If discussed at [place] of Stay Meetings, dates discussed:    Additional Comments:  Carles Collet, RN 04/22/2018, 10:37 AM

## 2018-04-22 NOTE — Progress Notes (Signed)
CARDIAC REHAB PHASE I   Did not ambulate with pt due to bp being 177/83. Nurse made aware. Put patient in chair and educated him and family on diet and restrictions. Patient in chair with family at side.   [place], MS 04/22/2018 11:41 AM

## 2018-04-22 NOTE — Progress Notes (Signed)
      [address]       ,[place] [address]             [phone]      4 Days Post-Op Procedure(s) (LRB): TRANSCATHETER AORTIC VALVE REPLACEMENT, TRANSAPICAL. 50mm EDWARDS SAPIEN 3 TRANSCATHETER HEART VALVE. (N/A) TRANSESOPHAGEAL ECHOCARDIOGRAM (TEE) (N/A)   Subjective:  No new complaints.  Feels up to going home today.  Objective: Vital signs in last 24 hours: Temp:  [97.7 F (36.5 C)] 97.7 F (36.5 C) (08/24 0327) Pulse Rate:  [79] 79 (08/23 2008) Cardiac Rhythm: Ventricular paced;A-V Sequential paced;Bundle branch block (08/24 0701) Resp:  [17-22] 17 (08/24 0327) BP: (135-164)/(70-95) 164/95 (08/24 0327) SpO2:  [97 %-98 %] 98 % (08/24 0327) Weight:  [87.1 kg] 87.1 kg (08/24 0327)  Intake/Output from previous day: 08/23 0701 - 08/24 0700 In: 238 [P.O.:238] Out: 1175 [Urine:1175]  General appearance: alert, cooperative and no distress Heart: regular rate and rhythm and paced Lungs: diminished breath sounds bibasilar Abdomen: soft, non-tender; bowel sounds normal; no masses,  no organomegaly Extremities: edema trace Wound: clean and dry, staples remain in place bilateral groins  Lab Results: Recent Labs    04/21/18 0438 04/22/18 0320  WBC 8.1 8.0  HGB 8.4* 9.0*  HCT 26.9* 28.7*  PLT 227 184   BMET:  Recent Labs    04/21/18 0438 04/22/18 0320  NA 137 138  K 4.5 4.1  CL 106 104  CO2 26 28  GLUCOSE 95 105*  BUN 23 26*  CREATININE 0.98 1.18  CALCIUM 8.6* 8.9    PT/INR: No results for input(s): LABPROT, INR in the last 72 hours. ABG    Component Value Date/Time   PHART 7.402 04/18/2018 1103   HCO3 23.6 04/18/2018 1103   TCO2 25 04/18/2018 1103   ACIDBASEDEF 1.0 04/18/2018 1103   O2SAT 99.0 04/18/2018 1103   CBG (last 3)  No results for input(s): GLUCAP in the last 72 hours.  Assessment/Plan: S/P Procedure(s) (LRB): TRANSCATHETER AORTIC VALVE REPLACEMENT, TRANSAPICAL. 14mm EDWARDS SAPIEN 3 TRANSCATHETER HEART VALVE.  (N/A) TRANSESOPHAGEAL ECHOCARDIOGRAM (TEE) (N/A)  1. S/P TAVR- doing well, post operative Echocardiogram shows normally functioning valve 2. Pulm- no acute issues, continue IS 3. Renal- creatinine remains in baseline range, currently at 1.10, he was diuresed aggressively yesterday, which is likely cause for slight elevation, he remains volume overloaded, will continue Lasix 4. S/P Fem Pop Bypass, staples remain in place to be removed on outpatient basis 5. Dispo- patient stable, will resume Eliquis on discharge today, decrease ASA, will d/c home today   LOS: 11 days    Ellwood Handler 04/22/2018

## 2018-04-22 NOTE — Progress Notes (Signed)
Bedrest up post IJ removal. Patient tolerated well. No bleeding noted. New dressing to both right and left groin. Small amount of drainage noted to left groin. Discharge instructions reviewed with patient and patient's family at this time. All questions answered.   Emelda Fear, RN

## 2018-04-23 DIAGNOSIS — I509 Heart failure, unspecified: Secondary | ICD-10-CM | POA: Diagnosis not present

## 2018-04-23 DIAGNOSIS — I2510 Atherosclerotic heart disease of native coronary artery without angina pectoris: Secondary | ICD-10-CM | POA: Diagnosis not present

## 2018-04-23 DIAGNOSIS — Z48812 Encounter for surgical aftercare following surgery on the circulatory system: Secondary | ICD-10-CM | POA: Diagnosis not present

## 2018-04-23 DIAGNOSIS — I350 Nonrheumatic aortic (valve) stenosis: Secondary | ICD-10-CM | POA: Diagnosis not present

## 2018-04-23 DIAGNOSIS — I110 Hypertensive heart disease with heart failure: Secondary | ICD-10-CM | POA: Diagnosis not present

## 2018-04-23 DIAGNOSIS — K5190 Ulcerative colitis, unspecified, without complications: Secondary | ICD-10-CM | POA: Diagnosis not present

## 2018-04-24 ENCOUNTER — Telehealth: Payer: Self-pay | Admitting: Cardiology

## 2018-04-24 NOTE — Telephone Encounter (Signed)
Daughter Dalton Townsend is calling to f/u on why pt was instructed to d/c amlodipine at the time of discharge.  He had been on this prior to his hospitalization and throughout his hospital stay.   Advised this will be reviewed and someone will call back.   Note - no DPR on file.  There is a Medical laboratory scientific officer on file which listed Kalman Shan (wife) and Cebert Dettmann as Attorneys-in-Fact.

## 2018-04-24 NOTE — Telephone Encounter (Signed)
I am not sure why they stopped it. I didn't discharge him. Tell them to keep a log of his pressures and I will address it Thursday.

## 2018-04-24 NOTE — Telephone Encounter (Signed)
New Message:   Pt daughter requesting a call back concerning her father

## 2018-04-24 NOTE — Telephone Encounter (Signed)
Daughter aware to keep a BP diary and bring it with them to the appt on Thursday.  Aware Dalton Townsend will address when she she him.

## 2018-04-25 ENCOUNTER — Other Ambulatory Visit: Payer: Self-pay

## 2018-04-25 DIAGNOSIS — I6523 Occlusion and stenosis of bilateral carotid arteries: Secondary | ICD-10-CM

## 2018-04-26 ENCOUNTER — Encounter: Payer: Self-pay | Admitting: Physician Assistant

## 2018-04-26 DIAGNOSIS — I110 Hypertensive heart disease with heart failure: Secondary | ICD-10-CM | POA: Diagnosis not present

## 2018-04-26 DIAGNOSIS — I2510 Atherosclerotic heart disease of native coronary artery without angina pectoris: Secondary | ICD-10-CM | POA: Diagnosis not present

## 2018-04-26 DIAGNOSIS — I509 Heart failure, unspecified: Secondary | ICD-10-CM | POA: Diagnosis not present

## 2018-04-26 DIAGNOSIS — I350 Nonrheumatic aortic (valve) stenosis: Secondary | ICD-10-CM | POA: Diagnosis not present

## 2018-04-26 DIAGNOSIS — Z48812 Encounter for surgical aftercare following surgery on the circulatory system: Secondary | ICD-10-CM | POA: Diagnosis not present

## 2018-04-26 DIAGNOSIS — K5190 Ulcerative colitis, unspecified, without complications: Secondary | ICD-10-CM | POA: Diagnosis not present

## 2018-04-27 ENCOUNTER — Encounter: Payer: Self-pay | Admitting: Physician Assistant

## 2018-04-27 ENCOUNTER — Ambulatory Visit (INDEPENDENT_AMBULATORY_CARE_PROVIDER_SITE_OTHER): Payer: Self-pay | Admitting: Physician Assistant

## 2018-04-27 ENCOUNTER — Ambulatory Visit (HOSPITAL_COMMUNITY)
Admission: RE | Admit: 2018-04-27 | Discharge: 2018-04-27 | Disposition: A | Discharge status: Home / Self Care | Payer: Medicare Other | Source: Ambulatory Visit | Attending: Vascular Surgery | Admitting: Vascular Surgery

## 2018-04-27 ENCOUNTER — Ambulatory Visit (INDEPENDENT_AMBULATORY_CARE_PROVIDER_SITE_OTHER): Payer: Medicare Other | Admitting: Physician Assistant

## 2018-04-27 VITALS — BP 208/96 | HR 94 | Temp 98.1°F | Resp 20 | Ht 71.0 in | Wt 187.0 lb

## 2018-04-27 VITALS — BP 150/70 | HR 87 | Ht 71.0 in | Wt 187.0 lb

## 2018-04-27 DIAGNOSIS — I6523 Occlusion and stenosis of bilateral carotid arteries: Secondary | ICD-10-CM

## 2018-04-27 DIAGNOSIS — Z95828 Presence of other vascular implants and grafts: Secondary | ICD-10-CM

## 2018-04-27 DIAGNOSIS — Z951 Presence of aortocoronary bypass graft: Secondary | ICD-10-CM | POA: Diagnosis not present

## 2018-04-27 DIAGNOSIS — Z952 Presence of prosthetic heart valve: Secondary | ICD-10-CM | POA: Diagnosis not present

## 2018-04-27 DIAGNOSIS — I480 Paroxysmal atrial fibrillation: Secondary | ICD-10-CM | POA: Diagnosis not present

## 2018-04-27 DIAGNOSIS — Z87891 Personal history of nicotine dependence: Secondary | ICD-10-CM | POA: Diagnosis not present

## 2018-04-27 DIAGNOSIS — D649 Anemia, unspecified: Secondary | ICD-10-CM

## 2018-04-27 DIAGNOSIS — I5032 Chronic diastolic (congestive) heart failure: Secondary | ICD-10-CM | POA: Diagnosis not present

## 2018-04-27 DIAGNOSIS — I10 Essential (primary) hypertension: Secondary | ICD-10-CM | POA: Diagnosis not present

## 2018-04-27 NOTE — Progress Notes (Signed)
POST OPERATIVE OFFICE NOTE    CC:  F/u for surgery  HPI:   Dalton Townsend is a 84 y.o. male who was undergoing TAVR there was some difficulty in passing the valve through the sheath which was of the right external iliac artery.  This ultimately went outside the sheath and injured the external iliac artery on the right.  In removing this the artery was transected in the iliac vein on the right was injured.  Dr. Scot Dock was consulted intraoperatively.  S/P left to right fem-fem bypass. Good doppler flow both feet throughout the rest of his hospital stay.     He is here today for staple removal of B groins and carotid duplex study.  s/p R CEA (2001) w/ 80-99% RICA occlusion per pre-op duplex on 04/04/2018.    Allergies  Allergen Reactions  . No Known Allergies     Current Outpatient Medications  Medication Sig Dispense Refill  . acetaminophen (TYLENOL) 500 MG tablet Take 1,000 mg by mouth at bedtime.    [name] apixaban (ELIQUIS) 5 MG TABS tablet Take 1 tablet (5 mg total) by mouth 2 (two) times daily. 60 tablet   . aspirin 81 MG chewable tablet Chew 1 tablet (81 mg total) by mouth daily.    [name] atorvastatin (LIPITOR) 20 MG tablet Take 20 mg by mouth every other day.     . balsalazide (COLAZAL) 750 MG capsule Take 2,250 mg by mouth 3 (three) times daily.    [name] escitalopram (LEXAPRO) 10 MG tablet Take 10 mg by mouth daily.      . finasteride (PROSCAR) 5 MG tablet Take 5 mg by mouth See admin instructions. Take one tablet (5 mg) by mouth every day except Wednesdays    . furosemide (LASIX) 40 MG tablet Take 1 tablet (40 mg total) by mouth daily. 30 tablet 1  . latanoprost (XALATAN) 0.005 % ophthalmic solution Place 1 drop into both eyes at bedtime.     . mesalamine (CANASA) 1000 MG suppository Place 1,000 mg rectally at bedtime as needed (ulcerative colitis).    . metoprolol succinate (TOPROL-XL) 50 MG 24 hr tablet Take 50 mg by mouth daily. Take with or immediately following a meal.    . Multiple  Vitamin (MULTIVITAMIN WITH MINERALS) TABS tablet Take 1 tablet by mouth daily.    . potassium chloride (KLOR-CON) 10 MEQ CR tablet Take 2 tablets (20 mEq total) by mouth 2 (two) times daily. To be taken with lasix (Patient taking differently: Take 20 mEq by mouth daily. To be taken with lasix) 30 tablet 0   No current facility-administered medications for this visit.      ROS:  See HPI  Physical Exam:  Vitals:   04/27/18 1614 04/27/18 1615  BP: (!) 201/95 (!) 208/96  Pulse: 94   Resp: 20   Temp: 98.1 F (36.7 C)   SpO2: 97%     Incision:  Right groin well healed,  incisional line yellow eschar left groin with SS drainage on guaze.  Patient tolrated staple remove well B groin incisions.   Extremities:  Knee high ted hose, active range of motion intact with 2/5 right hip flexors, knee ext and flexion are intact 4/5 MMT on the right LE since surgery.  Patella reflex brisk.  Palpable fem-fem bypass pulse. Lings : non labored breathing Abdomen:  Soft + BS  04/27/2018 Carotid duplex  Right ICA re-stenosis 80-99% Left < 59%  Assessment/Plan:  This is a 84 y.o. male who is  s/p: Fem-fem by pass graft  Asymptomatic carotid re-stenosis  F/U in 3 months for Bypass duplex and ABI's B LE.  F/U with Dr. Scot Dock 05/03/2018 for f/u discussion of carotid stenosis.  Duplex performed today of B carotids.  Hypertension with elevated BP in office.  We re checked it prior to leaving the office and it was 276 systolic.  He did see his Cardiologist today and is being followed closely.  F/U schedule with Dr. Scot Dock 05/03/2018 to discuss right carotid stenosis and check groin incisions.  Left groin with incisional line yellow eschar, no frank dehiscences of incision post staple removal.   Weak right hip flexors.  He uses his hand to help move his right LE secondary to weakness.  He does have [facility] RN and PT.  I would continue both.  I spoke to his family and him about keeping to left groin clean and dry with 4 x  4 guaze multiple changes a day.     Roxy Horseman , PA-C [facility] [phone]

## 2018-04-27 NOTE — Patient Instructions (Signed)
Medication Instructions:  Your physician recommends that you continue on your current medications as directed. Please refer to the Current Medication list given to you today.   Labwork: Lab work to be done today--BMP and CBC  Testing/Procedures: Have chest X ray done at [facility] are open Monday -Friday  8-5.  [facility] in the [place]: Follow up with K. Grandville Silos, [place] as planned on 05/17/18.  An echocardiogram will be done also.   Any Other Special Instructions Will Be Listed Below (If Applicable).     If you need a refill on your cardiac medications before your next appointment, please call your pharmacy.

## 2018-04-27 NOTE — Progress Notes (Signed)
[facility]                                       Cardiology Office Note    Date:  04/27/2018   ID:  Dalton Townsend, DOB 06/24/1923, MRN [id number]  PCP:  Lajean Manes, MD  Cardiologist: Dr. Marlou Porch / Dr. Burt Knack & Dr. Cyndia Bent (TAVR)  CC: [facility] s/p TAVR  History of Present Illness:  Dalton Townsend is a 84 y.o. male with a history of CAD s/p CABG (1999), PAD s/p R CEA (2001) w/ 80-99% RICA occlusion now, hx of CVA, s/p PPM, HTN, PAF on Eliquis, ulcerative colitis and severe AS s/p TAVR and emergent fem-fem bypass who presents to clinic for follow up.   He has known severe aortic stenosis that has been followed by Dr. Marlou Porch. An echocardiogram in 07/2016 showed severe aortic stenosis with a mean gradient of 47 mmHg. The patient was not having any symptoms at that time refused further work-up. He had a repeat echo in 05/2017 which showed no significant change in the mean gradient of 41 mmHg with normal left ventricular systolic function. The patient remained asymptomatic. Over the past year or so he has noted some exertional shortness of breath and fatigue but has not significantly affected his activity level. He said that he was going to the gym daily and working on aerobic exercise machines such as a recumbent bicycle. He had another echocardiogram on 12/20/2017 which showed no significant change in the severe aortic stenosis with a mean gradient of 40 mmHg and normal left ventricular ejection fraction of 55 to 60%. Then he noticed worsening shortness of breath after he woke up from a nap and could not walk to the bathroom without significant shortness of breath. His family called EMS and in the emergency department. He was admitted from 8/4-04/05/18 for acute diastolic CHF.Chest x-ray showed pulmonary edema. A repeat echocardiogram showed critical aortic stenosis with a mean gradient of 53 mmHg and a peak gradient of 88 mmHg. The  dimensionless index was 0.26 with a calculated valve area of 0.73 cm. Left ventricular systolic function remain normal. He improved with intravenous diuresis.Cardiac cath showed patent bypass grafts. He was evaluated by the multidisciplinary valve team and set up for TAVR on 04/11/18.  He underwent attempted right transfemoral TAVR on 7/34/19 that was complicated by injury to the right external iliac artery requiring emergent ligation of the external iliac artery and fem-fem bypass by Dr. Scot Dock. Follow up echocardiogram8/15/19 showed persistent critical aortic stenosis.He was taken back for repeat TAVR via the transapical approach on 04/18/18.26 mm Edwards Sapien 3 valve successfully deployed.Post op echo 04/19/18 showed a normally functioning TAVR valve with a mean gradient of 3 mm Hg and no PVL. He had post op volume overload that required diuresis. He was discharged on ASA and Eliquis.  Today he presents to clinic for follow up. He is doing a lot better. His shortness of breath has improved a lot. He has not been as active as he was but has been walking around the house with a walker. Today is his first time out of the house. He has some tenderness in his groin and LE edema, worse on right. He also has had some issues with picking up his right leg. He hopes physical therapy will help this. He is getting staples removed today. No  dizziness or syncope. He has had some black stools, but no frank blood. No orthopnea or PND. His energy is getting better everyday but it has been a slow recovery.     Past Medical History:  Diagnosis Date  . BPH (benign prostatic hyperplasia)   . CAD (coronary artery disease)    a. s/p CABG x6V in 1999, cath 03/2018 showed patent 6/6 bypass grafts  . Carotid artery disease ([place])    a. s/p R CEA, 80-99% restenosis of RICA, to be address after TAVR  . Dyslipidemia   . Essential hypertension   . GAD (generalized anxiety disorder)   . Intestinal infection due to  Clostridium difficile 03/2010  . PAF (paroxysmal atrial fibrillation) (HCC)    a. on Eliquis  . Presence of permanent cardiac pacemaker   . PVD (peripheral vascular disease) ([place])   . S/P femoral-femoral bypass surgery    a. emergent fem fem bypass due to iliac complication during first attempt TAVR placement on 04/11/18 by Dr. Scot Dock  . S/P TAVR (transcatheter aortic valve replacement)    a. 04/18/18: s/p succesful TAVR with a Slater-[place] 3 THV  . Severe aortic stenosis   . Skin cancer   . Ulcerative colitis     Past Surgical History:  Procedure Laterality Date  . BALLOON AORTIC VALVE VALVULOPLASTY N/A 04/11/2018   Procedure: BALLOON AORTIC VALVE VALVULOPLASTY.  RIGHT EXTERNAL ILIAC ARTERY OVERSEW.;  Surgeon: Sherren Mocha, MD;  Location: [place];  Service: Open Heart Surgery;  Laterality: N/A;  . CARDIAC CATHETERIZATION  1999  . CAROTID ENDARTERECTOMY Right 2004  . CATARACT EXTRACTION, BILATERAL  2011  . CORONARY ARTERY BYPASS GRAFT  04/1998   3 vessel CAD  . ENDARTERECTOMY FEMORAL Right 04/11/2018   Procedure: ENDARTERECTOMY FEMORAL;  Surgeon: Sherren Mocha, MD;  Location: [place];  Service: Open Heart Surgery;  Laterality: Right;  . FEMORAL-FEMORAL BYPASS GRAFT N/A 04/11/2018   Procedure: LEFT TO RIGHT BYPASS GRAFT FEMORAL-FEMORAL ARTERY;  Surgeon: Sherren Mocha, MD;  Location: [place];  Service: Open Heart Surgery;  Laterality: N/A;  . INCISION AND DRAINAGE OF WOUND Left 05/04/2016   Procedure: SURGICAL PREP FOR GRAFTING LEFT LEG WITH THERA SKIN APPLICATION;  Surgeon: Irene Limbo, MD;  Location: [place];  Service: Plastics;  Laterality: Left;  . INTRAOPERATIVE ARTERIOGRAM  06/2000  . INTRAOPERATIVE TRANSTHORACIC ECHOCARDIOGRAM  04/11/2018   Procedure: INTRAOPERATIVE TRANSTHORACIC ECHOCARDIOGRAM;  Surgeon: Sherren Mocha, MD;  Location: [facility];  Service: Open Heart Surgery;;  . PACEMAKER INSERTION  03/25/2010  . RIGHT/LEFT HEART CATH AND CORONARY/GRAFT ANGIOGRAPHY N/A 04/05/2018    Procedure: RIGHT/LEFT HEART CATH AND CORONARY/GRAFT ANGIOGRAPHY;  Surgeon: Burnell Blanks, MD;  Location: [place] [facility];  Service: Cardiovascular;  Laterality: N/A;  . TEE WITHOUT CARDIOVERSION N/A 04/18/2018   Procedure: TRANSESOPHAGEAL ECHOCARDIOGRAM (TEE);  Surgeon: Sherren Mocha, MD;  Location: [place];  Service: Open Heart Surgery;  Laterality: N/A;  . TOTAL KNEE ARTHROPLASTY  07/16/09  . TRANSCATHETER AORTIC VALVE REPLACEMENT, TRANSAPICAL N/A 04/18/2018   Procedure: TRANSCATHETER AORTIC VALVE REPLACEMENT, TRANSAPICAL. 78mm EDWARDS SAPIEN 3 TRANSCATHETER HEART VALVE.;  Surgeon: Sherren Mocha, MD;  Location: [place];  Service: Open Heart Surgery;  Laterality: N/A;  . TRANSURETHRAL RESECTION OF PROSTATE  1998    Current Medications: Outpatient Medications Prior to Visit  Medication Sig Dispense Refill  . acetaminophen (TYLENOL) 500 MG tablet Take 1,000 mg by mouth at bedtime.    [name] apixaban (ELIQUIS) 5 MG TABS tablet Take 1 tablet (5 mg total)  by mouth 2 (two) times daily. 60 tablet   . aspirin 81 MG chewable tablet Chew 1 tablet (81 mg total) by mouth daily.    [name] atorvastatin (LIPITOR) 20 MG tablet Take 20 mg by mouth every other day.     . balsalazide (COLAZAL) 750 MG capsule Take 2,250 mg by mouth 3 (three) times daily.    [name] escitalopram (LEXAPRO) 10 MG tablet Take 10 mg by mouth daily.      . finasteride (PROSCAR) 5 MG tablet Take 5 mg by mouth See admin instructions. Take one tablet (5 mg) by mouth every day except Wednesdays    . furosemide (LASIX) 40 MG tablet Take 1 tablet (40 mg total) by mouth daily. 30 tablet 1  . latanoprost (XALATAN) 0.005 % ophthalmic solution Place 1 drop into both eyes at bedtime.     . mesalamine (CANASA) 1000 MG suppository Place 1,000 mg rectally at bedtime as needed (ulcerative colitis).    . metoprolol succinate (TOPROL-XL) 50 MG 24 hr tablet Take 50 mg by mouth daily. Take with or immediately following a meal.    . Multiple Vitamin  (MULTIVITAMIN WITH MINERALS) TABS tablet Take 1 tablet by mouth daily.    . potassium chloride (KLOR-CON) 10 MEQ CR tablet Take 2 tablets (20 mEq total) by mouth 2 (two) times daily. To be taken with lasix (Patient taking differently: Take 20 mEq by mouth daily. To be taken with lasix) 30 tablet 0  . amoxicillin (AMOXIL) 500 MG capsule Take 2,000 mg by mouth See admin instructions. Take 4 capsules (2000 mg) by mouth one hour prior to dental appointments     No facility-administered medications prior to visit.      Allergies:   No known allergies   Social History   Socioeconomic History  . Marital status: Married    Spouse name: Not on file  . Number of children: 3  . Years of education: Not on file  . Highest education level: Not on file  Occupational History  . Occupation: retired  Scientific laboratory technician  . Financial resource strain: Not on file  . Food insecurity:    Worry: Not on file    Inability: Not on file  . Transportation needs:    Medical: Not on file    Non-medical: Not on file  Tobacco Use  . Smoking status: Former Smoker    Years: 10.00    Types: Cigarettes    Last attempt to quit: 08/30/1952    Years since quitting: 65.7  . Smokeless tobacco: Former Network engineer and Sexual Activity  . Alcohol use: No  . Drug use: No  . Sexual activity: Not on file  Lifestyle  . Physical activity:    Days per week: Not on file    Minutes per session: Not on file  . Stress: Not on file  Relationships  . Social connections:    Talks on phone: Not on file    Gets together: Not on file    Attends religious service: Not on file    Active member of club or organization: Not on file    Attends meetings of clubs or organizations: Not on file    Relationship status: Not on file  Other Topics Concern  . Not on file  Social History Narrative  . Not on file     Family History:  The patient's family history includes Cancer in his unknown relative; Congestive Heart Failure in his  mother; Heart failure in his unknown  relative; Leukemia in his father and unknown relative.     ROS:   Please see the history of present illness.    ROS All other systems reviewed and are negative.   PHYSICAL EXAM:   VS:  BP (!) 150/70   Pulse 87   Ht 5\' 11"  (1.803 m)   Wt 187 lb (84.8 kg)   SpO2 99%   BMI 26.08 kg/m    GEN: old and frail appearing. Walking with walker  HEENT: normal  Neck: no JVD, carotid bruits, or masses Cardiac: RRR; soft flow murmur @ RUSB. No rubs, or gallops. 1+ LE edema R>L  Respiratory:  clear to auscultation bilaterally, normal work of breathing GI: soft, nontender, nondistended, + BS MS: no deformity or atrophy  Skin: warm and dry, no rash. Chest wall site healing well.  Neuro:  Alert and Oriented x 3, Strength and sensation are intact Psych: euthymic mood, full affect    Wt Readings from Last 3 Encounters:  04/27/18 187 lb (84.8 kg)  04/22/18 192 lb 0.3 oz (87.1 kg)  04/07/18 187 lb 9.8 oz (85.1 kg)      Studies/Labs Reviewed:   EKG:  EKG is NOT ordered today.    Recent Labs: 04/07/2018: B Natriuretic Peptide 461.7 04/19/2018: Magnesium 2.1 04/22/2018: BUN 26; Creatinine, Ser 1.18; Hemoglobin 9.0; Platelets 184; Potassium 4.1; Sodium 138   Lipid Panel No results found for: CHOL, TRIG, HDL, CHOLHDL, VLDL, LDLCALC, LDLDIRECT  Additional studies/ records that were reviewed today include:  TAVR OPERATIVE NOTE   Date of Procedure:04/11/2018  Preoperative Diagnosis:Severe Aortic Stenosis  Procedure: ? Balloon aortic valvuloplasty  Co-Surgeons:Clarence H. Roxy Manns, MD and Sherren Mocha, MD  Pre-operative Echo Findings: ? Severe aortic stenosis ? Normalleft ventricular systolic function  _______________  2D ECHO 04/13/18: Study Conclusions - Left ventricle: The cavity size was normal. Systolic function was normal. The estimated ejection fraction was in the range of 60% to  65%. Wall motion was normal; there were no regional wall motion abnormalities. Doppler parameters are consistent with abnormal left ventricular relaxation (grade 1 diastolic dysfunction). Doppler parameters are consistent with elevated ventricular end-diastolic filling pressure. - Aortic valve: There was critical stenosis. There was mild regurgitation. Valve area (VTI): 0.55 cm^2. Valve area (Vmean): 0.54 cm^2. - Mitral valve: Calcified annulus. Mildly thickened leaflets . There was mild regurgitation. - Left atrium: The atrium was moderately dilated. - Right atrium: The atrium was moderately dilated. - Tricuspid valve: There was moderate regurgitation. - Pulmonary arteries: Systolic pressure was mildly increased. PA peak pressure: 39 mm Hg (S). - Inferior vena cava: The vessel was normal in size. Impressions: - Critical aortic stenosis with peak/mean gradients 99/65 mmHg. Preserved LVEF 60-65%.   _______________   TAVR OPERATIVE NOTE   Date of Procedure:04/18/2018  Preoperative Diagnosis:Severe Aortic Stenosis   Procedure:   Transcatheter Aortic Valve Replacement - Tanna Furry Sapien 3 THV (size 66mm, model # 9600TFX, serial # [id number])  Co-Surgeons:Bryan Alveria Apley, MD and Sherren Mocha, MD  Assisting: Darylene Price, MD  Pre-operative Echo Findings: ? Criticalaortic stenosis ? Normalleft ventricular systolic function  Post-operative Echo Findings: ? Mildparavalvular leak ? Normalleft ventricular systolic function  _______________  2D ECHO 04/19/18 Study Conclusions - Left ventricle: The cavity size was normal. There was mild focal basal hypertrophy of the septum. Systolic function was normal. The estimated ejection fraction was in the range of 55% to 60%. Wall motion was normal; there were no regional wall motion abnormalities. Doppler  parameters are consistent with abnormal  left ventricular relaxation (grade 1 diastolic dysfunction). - Ventricular septum: Septal motion showed abnormal function and dyssynergy. - Aortic valve: Transcatheter Aortic Valve Replacement - Transapical Approach, Edwards Sapien 3 THV (size 26 mm, model # 9600TFX, serial # [id number]) Peak velocity (S): 125 cm/s. Mean gradient (S): 3 mm Hg. Valve area (VTI): 2.87 cm^2. Valve area (Vmax): 2.7 cm^2. Valve area (Vmean): 2.57 cm^2. - Right ventricle: Pacer wire or catheter noted in right ventricle. - Tricuspid valve: There was moderate regurgitation. - Pulmonary arteries: Systolic pressure was moderately increased. PA peak pressure: 49 mm Hg (S).   ASSESSMENT & PLAN:   Severe AS s/p TAVR: chest wall site healing well. Suture removed. He is doing well from a cardiac standpoint. Feeling a lot better in terms of breathing. Will get follow up CXR as recommended by Dr. Cyndia Bent. Continue ASA and Eliquis. I will see him back for 1 month echo and follow up.   Emergent Fem-fem bypass: completed by Dr. Bosie Clos 04/11/18.He has a visit to get staples removed today by vascular. Having some right leg weakness, ? Nerve damage. Continue PT   Chronic diastolic CHF: appears euvolemic. Continue lasix at current dose. BMET today  Carotid artery disease: pre TAVR dopplers showed 80-99% RICA stenosis where he previously had R CEA. Evaluated by Dr. Bridgett Larsson prior to admission. Plan was for outpatient CT and follow up.He has an appointment with Dr. Scot Dock 05/03/18.  HTN: BP a little elevated today but well controlled on home readings. No changes made today. They will continue to monitor at home  PAF: continue Toprol XL and Eliquis  CAD s/p CABG x6V:  Pre TAVR cath showed 6/6 patent bypass grafts. Continue medical therapy.   Post operative anemia: will check CBC since we restarted his eliquis and he has had some dark stools and recent vascular  surgery   Medication Adjustments/Labs and Tests Ordered: Current medicines are reviewed at length with the patient today.  Concerns regarding medicines are outlined above.  Medication changes, Labs and Tests ordered today are listed in the Patient Instructions below. Patient Instructions  Medication Instructions:  Your physician recommends that you continue on your current medications as directed. Please refer to the Current Medication list given to you today.   Labwork: Lab work to be done today--BMP and CBC  Testing/Procedures: Have chest X ray done at [facility] are open Monday -Friday  8-5.  [place] [facility]: Follow up with K. Grandville Silos, [place] as planned on 05/17/18.  An echocardiogram will be done also.   Any Other Special Instructions Will Be Listed Below (If Applicable).     If you need a refill on your cardiac medications before your next appointment, please call your pharmacy.      Signed, Angelena Form, PA-C  04/27/2018 3:02 PM    [facility] [place], [place], [place]  [address] Phone: [phone]; Fax: [phone]

## 2018-04-28 DIAGNOSIS — I110 Hypertensive heart disease with heart failure: Secondary | ICD-10-CM | POA: Diagnosis not present

## 2018-04-28 DIAGNOSIS — I509 Heart failure, unspecified: Secondary | ICD-10-CM | POA: Diagnosis not present

## 2018-04-28 DIAGNOSIS — I2510 Atherosclerotic heart disease of native coronary artery without angina pectoris: Secondary | ICD-10-CM | POA: Diagnosis not present

## 2018-04-28 DIAGNOSIS — Z48812 Encounter for surgical aftercare following surgery on the circulatory system: Secondary | ICD-10-CM | POA: Diagnosis not present

## 2018-04-28 DIAGNOSIS — K5190 Ulcerative colitis, unspecified, without complications: Secondary | ICD-10-CM | POA: Diagnosis not present

## 2018-04-28 DIAGNOSIS — I350 Nonrheumatic aortic (valve) stenosis: Secondary | ICD-10-CM | POA: Diagnosis not present

## 2018-04-28 LAB — CBC
Hematocrit: 28.3 % — ABNORMAL LOW (ref 37.5–51.0)
Hemoglobin: 9.4 g/dL — ABNORMAL LOW (ref 13.0–17.7)
MCH: 30.3 pg (ref 26.6–33.0)
MCHC: 33.2 g/dL (ref 31.5–35.7)
MCV: 91 fL (ref 79–97)
PLATELETS: 192 10*3/uL (ref 150–450)
RBC: 3.1 x10E6/uL — AB (ref 4.14–5.80)
RDW: 15 % (ref 12.3–15.4)
WBC: 7 10*3/uL (ref 3.4–10.8)

## 2018-04-28 LAB — BASIC METABOLIC PANEL
BUN/Creatinine Ratio: 20 (ref 10–24)
BUN: 22 mg/dL (ref 10–36)
CALCIUM: 9.3 mg/dL (ref 8.6–10.2)
CHLORIDE: 100 mmol/L (ref 96–106)
CO2: 24 mmol/L (ref 20–29)
Creatinine, Ser: 1.1 mg/dL (ref 0.76–1.27)
GFR calc non Af Amer: 58 mL/min/{1.73_m2} — ABNORMAL LOW (ref >59)
GFR, EST AFRICAN AMERICAN: 67 mL/min/{1.73_m2} (ref >59)
GLUCOSE: 88 mg/dL (ref 65–99)
Potassium: 4.3 mmol/L (ref 3.5–5.2)
Sodium: 139 mmol/L (ref 134–144)

## 2018-05-02 ENCOUNTER — Ambulatory Visit: Payer: Medicare Other | Admitting: Cardiology

## 2018-05-02 DIAGNOSIS — K5190 Ulcerative colitis, unspecified, without complications: Secondary | ICD-10-CM | POA: Diagnosis not present

## 2018-05-02 DIAGNOSIS — Z48812 Encounter for surgical aftercare following surgery on the circulatory system: Secondary | ICD-10-CM | POA: Diagnosis not present

## 2018-05-02 DIAGNOSIS — I110 Hypertensive heart disease with heart failure: Secondary | ICD-10-CM | POA: Diagnosis not present

## 2018-05-02 DIAGNOSIS — I350 Nonrheumatic aortic (valve) stenosis: Secondary | ICD-10-CM | POA: Diagnosis not present

## 2018-05-02 DIAGNOSIS — I509 Heart failure, unspecified: Secondary | ICD-10-CM | POA: Diagnosis not present

## 2018-05-02 DIAGNOSIS — I2510 Atherosclerotic heart disease of native coronary artery without angina pectoris: Secondary | ICD-10-CM | POA: Diagnosis not present

## 2018-05-03 ENCOUNTER — Encounter: Payer: Self-pay | Admitting: Vascular Surgery

## 2018-05-03 ENCOUNTER — Ambulatory Visit (INDEPENDENT_AMBULATORY_CARE_PROVIDER_SITE_OTHER): Payer: Self-pay | Admitting: Vascular Surgery

## 2018-05-03 ENCOUNTER — Encounter: Payer: Medicare Other | Admitting: Vascular Surgery

## 2018-05-03 ENCOUNTER — Ambulatory Visit: Payer: Medicare Other | Admitting: Surgery

## 2018-05-03 ENCOUNTER — Other Ambulatory Visit: Payer: Self-pay

## 2018-05-03 ENCOUNTER — Ambulatory Visit
Admission: RE | Admit: 2018-05-03 | Discharge: 2018-05-03 | Disposition: A | Discharge status: Home / Self Care | Payer: Medicare Other | Source: Ambulatory Visit | Attending: Physician Assistant | Admitting: Physician Assistant

## 2018-05-03 VITALS — BP 181/84 | HR 74 | Temp 98.3°F | Resp 20 | Ht 71.0 in | Wt 185.0 lb

## 2018-05-03 DIAGNOSIS — I5032 Chronic diastolic (congestive) heart failure: Secondary | ICD-10-CM

## 2018-05-03 DIAGNOSIS — Z952 Presence of prosthetic heart valve: Secondary | ICD-10-CM

## 2018-05-03 DIAGNOSIS — Z48812 Encounter for surgical aftercare following surgery on the circulatory system: Secondary | ICD-10-CM

## 2018-05-03 NOTE — Progress Notes (Signed)
Patient name: Dalton Townsend MRN: [id number] DOB: [birth year] Sex: male  REASON FOR VISIT:   Follow-up after femorofemoral bypass.  Also to discuss a greater than 80% right carotid stenosis.  HPI:   Dalton Townsend is a pleasant 84 y.o. male who is undergoing a TAVR on 04/11/2018 and had a right external iliac artery injury.  The injury to the right external iliac vein and external iliac artery were oversewn and I performed endarterectomy of the right common femoral artery and a left to right femorofemoral bypass with 8 mm Dacron graft.  Comes in for a follow-up visit.  With respect to his bypass he denies any significant claudication.  He has had some mild leg swelling and is been wearing his compression stockings.  He has one small area on the left groin incision that the home health nurse wanted [place] to check on.  He denies any significant drainage from this area he denies any fever or chills.  He was noted to have a greater than 80% right carotid stenosis on his preop studies.  He is undergone previous right carotid endarterectomy in the remote past by Dr. Drucie Opitz although I cannot find this op note.  He is asymptomatic from the standpoint.  He denies any history of stroke, TIAs, expressive or receptive aphasia, or amaurosis fugax.  Current Outpatient Medications  Medication Sig Dispense Refill  . acetaminophen (TYLENOL) 500 MG tablet Take 1,000 mg by mouth at bedtime.    [name] apixaban (ELIQUIS) 5 MG TABS tablet Take 1 tablet (5 mg total) by mouth 2 (two) times daily. 60 tablet   . aspirin 81 MG chewable tablet Chew 1 tablet (81 mg total) by mouth daily.    [name] atorvastatin (LIPITOR) 20 MG tablet Take 20 mg by mouth every other day.     . balsalazide (COLAZAL) 750 MG capsule Take 2,250 mg by mouth 3 (three) times daily.    [name] escitalopram (LEXAPRO) 10 MG tablet Take 10 mg by mouth daily.      . finasteride (PROSCAR) 5 MG tablet Take 5 mg by mouth See admin instructions. Take one tablet (5  mg) by mouth every day except Wednesdays    . furosemide (LASIX) 40 MG tablet Take 1 tablet (40 mg total) by mouth daily. 30 tablet 1  . latanoprost (XALATAN) 0.005 % ophthalmic solution Place 1 drop into both eyes at bedtime.     . mesalamine (CANASA) 1000 MG suppository Place 1,000 mg rectally at bedtime as needed (ulcerative colitis).    . metoprolol succinate (TOPROL-XL) 50 MG 24 hr tablet Take 50 mg by mouth daily. Take with or immediately following a meal.    . Multiple Vitamin (MULTIVITAMIN WITH MINERALS) TABS tablet Take 1 tablet by mouth daily.    . potassium chloride (KLOR-CON) 10 MEQ CR tablet Take 2 tablets (20 mEq total) by mouth 2 (two) times daily. To be taken with lasix (Patient taking differently: Take 20 mEq by mouth daily. To be taken with lasix) 30 tablet 0   No current facility-administered medications for this visit.     REVIEW OF SYSTEMS:  [X]  denotes positive finding, [ ]  denotes negative finding Vascular    Leg swelling    Cardiac    Chest pain or chest pressure:    Shortness of breath upon exertion: x   Short of breath when lying flat:    Irregular heart rhythm: x   Constitutional    Fever or chills:  PHYSICAL EXAM:   Vitals:   05/03/18 1034  BP: (!) 181/84  Pulse: 74  Resp: 20  Temp: 98.3 F (36.8 C)  TempSrc: Oral  SpO2: 100%  Weight: 185 lb (83.9 kg)  Height: 5' 11"  (1.803 m)   GENERAL: The patient is a well-nourished male, in no acute distress. The vital signs are documented above. CARDIOVASCULAR: There is a regular rate and rhythm. PULMONARY: There is good air exchange bilaterally without wheezing or rales. VASCULAR: Patient has a right carotid bruit. He has a palpable graft pulse.  Both feet are warm and well-perfused. He has mild bilateral lower extremity swelling.  DATA:   CAROTID DUPLEX: I reviewed his most recent carotid duplex scan that was done on 04/27/2018.  This suggested a greater than 80% recurrent right carotid stenosis with  a 40 to 59% left carotid stenosis.  End-diastolic velocity on the right was 121 cm/s which would put it right at 80%.  MEDICAL ISSUES:   STATUS POST EMERGENT FEMOROFEMORAL BYPASS: His femorofemoral bypass graft is patent.  His groin incisions are healing adequately.  I have given the family instructions on how to do his dressing changes to protect the wounds.  Of ordered a follow-up duplex of his graft in 3 months and ABIs at that time.  ASYMPTOMATIC RECURRENT GREATER THAN 80% RIGHT CAROTID STENOSIS: This patient has a recurrent greater than 80% right carotid stenosis.  I have explained that in a normal risk patient we would consider elective repair of this however I think he is at increased risk because of his age and recent major surgery.  He is asymptomatic.  He is on aspirin, and a statin.  All things considered, I would not recommend receding with addressing this lesion currently and would prefer to have him recover from his recent surgery.  When he returns in 3 months for a duplex of his femorofemoral graft I have ordered a CT angiogram of the neck.  This will help [place] determine if he is a candidate for a TECAR.  If the stenosis progresses then I think this would be a consideration and I have discussed this with Dr. Oneida Alar who agrees that he could potentially be a candidate.  He would likely require Plavix perioperatively for several weeks but this could be discontinued subsequently given that he is on Eliquis.  Will discuss this further when he returns in 3 months.  Deitra Mayo [facility] [phone]

## 2018-05-04 DIAGNOSIS — I350 Nonrheumatic aortic (valve) stenosis: Secondary | ICD-10-CM | POA: Diagnosis not present

## 2018-05-04 DIAGNOSIS — I2510 Atherosclerotic heart disease of native coronary artery without angina pectoris: Secondary | ICD-10-CM | POA: Diagnosis not present

## 2018-05-04 DIAGNOSIS — K5190 Ulcerative colitis, unspecified, without complications: Secondary | ICD-10-CM | POA: Diagnosis not present

## 2018-05-04 DIAGNOSIS — I509 Heart failure, unspecified: Secondary | ICD-10-CM | POA: Diagnosis not present

## 2018-05-04 DIAGNOSIS — I110 Hypertensive heart disease with heart failure: Secondary | ICD-10-CM | POA: Diagnosis not present

## 2018-05-04 DIAGNOSIS — Z48812 Encounter for surgical aftercare following surgery on the circulatory system: Secondary | ICD-10-CM | POA: Diagnosis not present

## 2018-05-05 ENCOUNTER — Other Ambulatory Visit: Payer: Self-pay

## 2018-05-05 DIAGNOSIS — K5190 Ulcerative colitis, unspecified, without complications: Secondary | ICD-10-CM | POA: Diagnosis not present

## 2018-05-05 DIAGNOSIS — I110 Hypertensive heart disease with heart failure: Secondary | ICD-10-CM | POA: Diagnosis not present

## 2018-05-05 DIAGNOSIS — Z95828 Presence of other vascular implants and grafts: Secondary | ICD-10-CM

## 2018-05-05 DIAGNOSIS — I6523 Occlusion and stenosis of bilateral carotid arteries: Secondary | ICD-10-CM

## 2018-05-05 DIAGNOSIS — I509 Heart failure, unspecified: Secondary | ICD-10-CM | POA: Diagnosis not present

## 2018-05-05 DIAGNOSIS — Z48812 Encounter for surgical aftercare following surgery on the circulatory system: Secondary | ICD-10-CM | POA: Diagnosis not present

## 2018-05-05 DIAGNOSIS — I350 Nonrheumatic aortic (valve) stenosis: Secondary | ICD-10-CM | POA: Diagnosis not present

## 2018-05-05 DIAGNOSIS — I2510 Atherosclerotic heart disease of native coronary artery without angina pectoris: Secondary | ICD-10-CM | POA: Diagnosis not present

## 2018-05-08 DIAGNOSIS — K5190 Ulcerative colitis, unspecified, without complications: Secondary | ICD-10-CM | POA: Diagnosis not present

## 2018-05-08 DIAGNOSIS — I110 Hypertensive heart disease with heart failure: Secondary | ICD-10-CM | POA: Diagnosis not present

## 2018-05-08 DIAGNOSIS — I2510 Atherosclerotic heart disease of native coronary artery without angina pectoris: Secondary | ICD-10-CM | POA: Diagnosis not present

## 2018-05-08 DIAGNOSIS — I350 Nonrheumatic aortic (valve) stenosis: Secondary | ICD-10-CM | POA: Diagnosis not present

## 2018-05-08 DIAGNOSIS — I509 Heart failure, unspecified: Secondary | ICD-10-CM | POA: Diagnosis not present

## 2018-05-08 DIAGNOSIS — Z48812 Encounter for surgical aftercare following surgery on the circulatory system: Secondary | ICD-10-CM | POA: Diagnosis not present

## 2018-05-09 DIAGNOSIS — K5190 Ulcerative colitis, unspecified, without complications: Secondary | ICD-10-CM | POA: Diagnosis not present

## 2018-05-09 DIAGNOSIS — Z48812 Encounter for surgical aftercare following surgery on the circulatory system: Secondary | ICD-10-CM | POA: Diagnosis not present

## 2018-05-09 DIAGNOSIS — I509 Heart failure, unspecified: Secondary | ICD-10-CM | POA: Diagnosis not present

## 2018-05-09 DIAGNOSIS — I110 Hypertensive heart disease with heart failure: Secondary | ICD-10-CM | POA: Diagnosis not present

## 2018-05-09 DIAGNOSIS — I2510 Atherosclerotic heart disease of native coronary artery without angina pectoris: Secondary | ICD-10-CM | POA: Diagnosis not present

## 2018-05-09 DIAGNOSIS — I350 Nonrheumatic aortic (valve) stenosis: Secondary | ICD-10-CM | POA: Diagnosis not present

## 2018-05-11 DIAGNOSIS — I350 Nonrheumatic aortic (valve) stenosis: Secondary | ICD-10-CM | POA: Diagnosis not present

## 2018-05-11 DIAGNOSIS — K5190 Ulcerative colitis, unspecified, without complications: Secondary | ICD-10-CM | POA: Diagnosis not present

## 2018-05-11 DIAGNOSIS — I110 Hypertensive heart disease with heart failure: Secondary | ICD-10-CM | POA: Diagnosis not present

## 2018-05-11 DIAGNOSIS — Z48812 Encounter for surgical aftercare following surgery on the circulatory system: Secondary | ICD-10-CM | POA: Diagnosis not present

## 2018-05-11 DIAGNOSIS — I509 Heart failure, unspecified: Secondary | ICD-10-CM | POA: Diagnosis not present

## 2018-05-11 DIAGNOSIS — I2510 Atherosclerotic heart disease of native coronary artery without angina pectoris: Secondary | ICD-10-CM | POA: Diagnosis not present

## 2018-05-15 DIAGNOSIS — I110 Hypertensive heart disease with heart failure: Secondary | ICD-10-CM | POA: Diagnosis not present

## 2018-05-15 DIAGNOSIS — I509 Heart failure, unspecified: Secondary | ICD-10-CM | POA: Diagnosis not present

## 2018-05-15 DIAGNOSIS — Z48812 Encounter for surgical aftercare following surgery on the circulatory system: Secondary | ICD-10-CM | POA: Diagnosis not present

## 2018-05-15 DIAGNOSIS — I350 Nonrheumatic aortic (valve) stenosis: Secondary | ICD-10-CM | POA: Diagnosis not present

## 2018-05-15 DIAGNOSIS — I2510 Atherosclerotic heart disease of native coronary artery without angina pectoris: Secondary | ICD-10-CM | POA: Diagnosis not present

## 2018-05-15 DIAGNOSIS — K5190 Ulcerative colitis, unspecified, without complications: Secondary | ICD-10-CM | POA: Diagnosis not present

## 2018-05-15 NOTE — Progress Notes (Addendum)
[facility] [place]                                       Cardiology Office Note    Date:  05/18/2018   ID:  DAVISON OHMS, DOB 05-Aug-1923, MRN [id number]  PCP:  Lajean Manes, MD  Cardiologist: Dr. Marlou Porch / Dr. Burt Knack & Dr.Bartle(TAVR)  CC: 1 month s/p TAVR   History of Present Illness:  Dalton Townsend is a 84 y.o. male with a history of CAD s/p CABG (1999), PAD s/p R CEA (2001) w/ 80-99% RICA occlusion now, hx of CVA, s/p PPM, HTN, PAF on Eliquis, ulcerative colitis and severe AS s/p TAVR and emergent fem-fem bypass who presents to clinic for follow up.   He was admitted from 8/4-04/05/18 for acute diastolic CHF.A repeat echocardiogram showed critical aortic stenosis with a mean gradient of 53 mmHg and a peak gradient of 88 mmHg. The dimensionless index was 0.26 with a calculated valve area of 0.73 cm. Left ventricular systolic function remain normal. He improved with intravenous diuresis.Cardiac cath showed patent bypass grafts.  He underwent attempted right transfemoral TAVRon 0/35/59RCBU was complicated by injury to the right external iliac artery requiring emergent ligation of the external iliac artery and fem-fem bypassby Dr. Scot Dock.Follow up echocardiogram8/15/19 showedpersistent critical aortic stenosis.He was taken back for repeat TAVR via the transapical approach on 04/18/18.26 mm Edwards Sapien 3 valve successfully deployed.Post op echo8/21/19showeda normally functioning TAVR valve with a mean gradient of 3 mm Hg and no PVL. He had post op volume overload that required diuresis. He was discharged on ASA and Eliquis.  At post hospital follow up he was doing well. He had some weakness in right leg. Follow up labs showed stable blood counts. CXR showed improved aeration.  He was seen by vascular for staples removal and discussion over high grade RICA restenosis. Dr. Scot Dock would like him to continue to recover  from most recent surgery but plans to repeat CT angio neck in 3 months for consideration of future TECAR.  Today he presents to clinic for follow up. Still walking with a walker at home but sometimes a cane. When up walking around he is breathing much better. He is able to get up and down the stairs okay. No Le edema, orhopnea or PND. He used to have bendopnea which has now improved since TAVR. Mostly limited by recovery from vascular surgery.   Past Medical History:  Diagnosis Date  . BPH (benign prostatic hyperplasia)   . CAD (coronary artery disease)    a. s/p CABG x6V in 1999, cath 03/2018 showed patent 6/6 bypass grafts  . Carotid artery disease ([place])    a. s/p R CEA, 80-99% restenosis of RICA, to be address after TAVR  . Dyslipidemia   . Essential hypertension   . GAD (generalized anxiety disorder)   . Intestinal infection due to Clostridium difficile 03/2010  . PAF (paroxysmal atrial fibrillation) ([facility])    a. on Eliquis  . Presence of permanent cardiac pacemaker   . PVD (peripheral vascular disease) ([place])   . S/P femoral-femoral bypass surgery    a. emergent fem fem bypass due to iliac complication during first attempt TAVR placement on 04/11/18 by Dr. Scot Dock  . S/P TAVR (transcatheter aortic valve replacement)    a. 04/18/18: s/p succesful TAVR with a Manning 3 THV  .  Severe aortic stenosis   . Skin cancer   . Ulcerative colitis     Past Surgical History:  Procedure Laterality Date  . BALLOON AORTIC VALVE VALVULOPLASTY N/A 04/11/2018   Procedure: BALLOON AORTIC VALVE VALVULOPLASTY.  RIGHT EXTERNAL ILIAC ARTERY OVERSEW.;  Surgeon: Sherren Mocha, MD;  Location: [facility];  Service: Open Heart Surgery;  Laterality: N/A;  . CARDIAC CATHETERIZATION  1999  . CAROTID ENDARTERECTOMY Right 2004  . CATARACT EXTRACTION, BILATERAL  2011  . CORONARY ARTERY BYPASS GRAFT  04/1998   3 vessel CAD  . ENDARTERECTOMY FEMORAL Right 04/11/2018   Procedure: ENDARTERECTOMY FEMORAL;  Surgeon:  Sherren Mocha, MD;  Location: [facility];  Service: Open Heart Surgery;  Laterality: Right;  . FEMORAL-FEMORAL BYPASS GRAFT N/A 04/11/2018   Procedure: LEFT TO RIGHT BYPASS GRAFT FEMORAL-FEMORAL ARTERY;  Surgeon: Sherren Mocha, MD;  Location: [facility];  Service: Open Heart Surgery;  Laterality: N/A;  . INCISION AND DRAINAGE OF WOUND Left 05/04/2016   Procedure: SURGICAL PREP FOR GRAFTING LEFT LEG WITH THERA SKIN APPLICATION;  Surgeon: Irene Limbo, MD;  Location: [facility];  Service: Plastics;  Laterality: Left;  . INTRAOPERATIVE ARTERIOGRAM  06/2000  . INTRAOPERATIVE TRANSTHORACIC ECHOCARDIOGRAM  04/11/2018   Procedure: INTRAOPERATIVE TRANSTHORACIC ECHOCARDIOGRAM;  Surgeon: Sherren Mocha, MD;  Location: [facility];  Service: Open Heart Surgery;;  . PACEMAKER INSERTION  03/25/2010  . RIGHT/LEFT HEART CATH AND CORONARY/GRAFT ANGIOGRAPHY N/A 04/05/2018   Procedure: RIGHT/LEFT HEART CATH AND CORONARY/GRAFT ANGIOGRAPHY;  Surgeon: Burnell Blanks, MD;  Location: [facility];  Service: Cardiovascular;  Laterality: N/A;  . TEE WITHOUT CARDIOVERSION N/A 04/18/2018   Procedure: TRANSESOPHAGEAL ECHOCARDIOGRAM (TEE);  Surgeon: Sherren Mocha, MD;  Location: [facility];  Service: Open Heart Surgery;  Laterality: N/A;  . TOTAL KNEE ARTHROPLASTY  07/16/09  . TRANSCATHETER AORTIC VALVE REPLACEMENT, TRANSAPICAL N/A 04/18/2018   Procedure: TRANSCATHETER AORTIC VALVE REPLACEMENT, TRANSAPICAL. 9mm EDWARDS SAPIEN 3 TRANSCATHETER HEART VALVE.;  Surgeon: Sherren Mocha, MD;  Location: [facility];  Service: Open Heart Surgery;  Laterality: N/A;  . TRANSURETHRAL RESECTION OF PROSTATE  1998    Current Medications: Outpatient Medications Prior to Visit  Medication Sig Dispense Refill  . acetaminophen (TYLENOL) 500 MG tablet Take 1,000 mg by mouth at bedtime.    [name] amLODipine (NORVASC) 5 MG tablet Take 5 mg by mouth daily.    [name] apixaban (ELIQUIS) 5 MG TABS tablet Take 1 tablet (5 mg total) by mouth 2 (two) times daily. 60  tablet   . aspirin 81 MG chewable tablet Chew 1 tablet (81 mg total) by mouth daily.    [name] atorvastatin (LIPITOR) 20 MG tablet Take 20 mg by mouth every other day.     . balsalazide (COLAZAL) 750 MG capsule Take 2,250 mg by mouth 3 (three) times daily.    [name] escitalopram (LEXAPRO) 10 MG tablet Take 10 mg by mouth daily.      . finasteride (PROSCAR) 5 MG tablet Take 5 mg by mouth See admin instructions. Take one tablet (5 mg) by mouth every day except Wednesdays    . furosemide (LASIX) 40 MG tablet Take 1 tablet (40 mg total) by mouth daily. 30 tablet 1  . latanoprost (XALATAN) 0.005 % ophthalmic solution Place 1 drop into both eyes at bedtime.     . mesalamine (CANASA) 1000 MG suppository Place 1,000 mg rectally at bedtime as needed (ulcerative colitis).    . metoprolol succinate (TOPROL-XL) 50 MG 24 hr tablet Take 50 mg by mouth daily. Take with  or immediately following a meal.    . Multiple Vitamin (MULTIVITAMIN WITH MINERALS) TABS tablet Take 1 tablet by mouth daily.    . potassium chloride (K-DUR) 10 MEQ tablet Take 20 mEq by mouth daily.    . potassium chloride (KLOR-CON) 10 MEQ CR tablet Take 2 tablets (20 mEq total) by mouth 2 (two) times daily. To be taken with lasix (Patient not taking: Reported on 05/17/2018) 30 tablet 0  . perflutren lipid microspheres (DEFINITY) IV suspension      No facility-administered medications prior to visit.      Allergies:   No known allergies   Social History   Socioeconomic History  . Marital status: Married    Spouse name: Not on file  . Number of children: 3  . Years of education: Not on file  . Highest education level: Not on file  Occupational History  . Occupation: retired  Scientific laboratory technician  . Financial resource strain: Not on file  . Food insecurity:    Worry: Not on file    Inability: Not on file  . Transportation needs:    Medical: Not on file    Non-medical: Not on file  Tobacco Use  . Smoking status: Former Smoker    Years: 10.00     Types: Cigarettes    Last attempt to quit: 08/30/1952    Years since quitting: 65.7  . Smokeless tobacco: Former Network engineer and Sexual Activity  . Alcohol use: No  . Drug use: No  . Sexual activity: Not on file  Lifestyle  . Physical activity:    Days per week: Not on file    Minutes per session: Not on file  . Stress: Not on file  Relationships  . Social connections:    Talks on phone: Not on file    Gets together: Not on file    Attends religious service: Not on file    Active member of club or organization: Not on file    Attends meetings of clubs or organizations: Not on file    Relationship status: Not on file  Other Topics Concern  . Not on file  Social History Narrative  . Not on file     Family History:  The patient's family history includes Cancer in his unknown relative; Congestive Heart Failure in his mother; Heart failure in his unknown relative; Leukemia in his father and unknown relative.     ROS:   Please see the history of present illness.    ROS All other systems reviewed and are negative.   PHYSICAL EXAM:   VS:  BP (!) 170/62   Pulse 63   Ht 5\' 11"  (1.803 m)   Wt 179 lb (81.2 kg)   SpO2 99%   BMI 24.97 kg/m    GEN: Well nourished, well developed, in no acute distress. elderly but looks much improved HEENT: normal  Neck: no JVD, carotid bruits, or masses Cardiac: RRR. Soft flow murmur. No rubs, or gallops,no edema  Respiratory:  clear to auscultation bilaterally, normal work of breathing GI: soft, nontender, nondistended, + BS MS: no deformity or atrophy  Skin: warm and dry, no rash Neuro:  Alert and Oriented x 3, Strength and sensation are intact Psych: euthymic mood, full affect   Wt Readings from Last 3 Encounters:  05/17/18 179 lb (81.2 kg)  05/03/18 185 lb (83.9 kg)  04/27/18 187 lb (84.8 kg)      Studies/Labs Reviewed:   EKG:  EKG is NOT ordered  today.   Recent Labs: 04/07/2018: B Natriuretic Peptide 461.7 04/19/2018: Magnesium  2.1 04/27/2018: Hemoglobin 9.4; Platelets 192 05/17/2018: BUN 20; Creatinine, Ser 1.05; NT-Pro BNP 3,239; Potassium 4.2; Sodium 141   Lipid Panel No results found for: CHOL, TRIG, HDL, CHOLHDL, VLDL, LDLCALC, LDLDIRECT  Additional studies/ records that were reviewed today include:  TAVR OPERATIVE NOTE   Date of Procedure:04/11/2018  Preoperative Diagnosis:Severe Aortic Stenosis  Procedure: ? Balloon aortic valvuloplasty  Co-Surgeons:Clarence H. Roxy Manns, MD and Sherren Mocha, MD  Pre-operative Echo Findings: ? Severe aortic stenosis ? Normalleft ventricular systolic function  _______________  2D ECHO 04/13/18: Study Conclusions - Left ventricle: The cavity size was normal. Systolic function was normal. The estimated ejection fraction was in the range of 60% to 65%. Wall motion was normal; there were no regional wall motion abnormalities. Doppler parameters are consistent with abnormal left ventricular relaxation (grade 1 diastolic dysfunction). Doppler parameters are consistent with elevated ventricular end-diastolic filling pressure. - Aortic valve: There was critical stenosis. There was mild regurgitation. Valve area (VTI): 0.55 cm^2. Valve area (Vmean): 0.54 cm^2. - Mitral valve: Calcified annulus. Mildly thickened leaflets . There was mild regurgitation. - Left atrium: The atrium was moderately dilated. - Right atrium: The atrium was moderately dilated. - Tricuspid valve: There was moderate regurgitation. - Pulmonary arteries: Systolic pressure was mildly increased. PA peak pressure: 39 mm Hg (S). - Inferior vena cava: The vessel was normal in size. Impressions: - Critical aortic stenosis with peak/mean gradients 99/65 mmHg. Preserved LVEF 60-65%.   _______________   TAVR OPERATIVE NOTE   Date of Procedure:04/18/2018  Preoperative Diagnosis:Severe Aortic  Stenosis   Procedure:   Transcatheter Aortic Valve Replacement - Tanna Furry Sapien 3 THV (size 91mm, model # 9600TFX, serial # [id number])  Co-Surgeons:Bryan Alveria Apley, MD and Sherren Mocha, MD  Assisting: Darylene Price, MD  Pre-operative Echo Findings: ? Criticalaortic stenosis ? Normalleft ventricular systolic function  Post-operative Echo Findings: ? Mildparavalvular leak ? Normalleft ventricular systolic function  _______________  2D ECHO 04/19/18 Study Conclusions - Left ventricle: The cavity size was normal. There was mild focal basal hypertrophy of the septum. Systolic function was normal. The estimated ejection fraction was in the range of 55% to 60%. Wall motion was normal; there were no regional wall motion abnormalities. Doppler parameters are consistent with abnormal left ventricular relaxation (grade 1 diastolic dysfunction). - Ventricular septum: Septal motion showed abnormal function and dyssynergy. - Aortic valve: Transcatheter Aortic Valve Replacement - Transapical Approach, Edwards Sapien 3 THV (size 26 mm, model # 9600TFX, serial # [id number]) Peak velocity (S): 125 cm/s. Mean gradient (S): 3 mm Hg. Valve area (VTI): 2.87 cm^2. Valve area (Vmax): 2.7 cm^2. Valve area (Vmean): 2.57 cm^2. - Right ventricle: Pacer wire or catheter noted in right ventricle. - Tricuspid valve: There was moderate regurgitation. - Pulmonary arteries: Systolic pressure was moderately increased. PA peak pressure: 49 mm Hg (S).  _________________  2D ECHO 05/17/18 (1 month s/p TAVR) Study Conclusions - Procedure narrative: Transthoracic echocardiography. Image   quality was suboptimal. The study was technically difficult.   Intravenous contrast (Definity) was administered to opacify the   LV. - Left ventricle: The cavity size was normal. Wall thickness was   increased in a pattern of  mild LVH. Systolic function was normal.   The estimated ejection fraction was in the range of 60% to 65%.   Wall motion was normal; there were no regional wall motion   abnormalities. The study is not technically sufficient to allow  evaluation of LV diastolic function. - Aortic valve: s/p Edwards Sapien 3 THV (26 mm) - no obstruction   or paravalvular leak. Mean gradient (S): 12 mm Hg. Peak gradient   (S): 31 mm Hg. Valve area (VTI): 2.44 cm^2. Valve area (Vmax):   1.7 cm^2. Valve area (Vmean): 2.18 cm^2. - Mitral valve: Mildly thickened leaflets . There was trivial   regurgitation. - Left atrium: Moderately dilated. - Right ventricle: The cavity size was normal. Wall thickness was   normal. Pacer wire or catheter noted in right ventricle. Systolic   function was normal. - Right atrium: The atrium was at the upper limits of normal in   size. Pacer wire or catheter noted in right atrium. - Tricuspid valve: There was mild regurgitation. - Pulmonary arteries: PA peak pressure: 52 mm Hg (S). - Inferior vena cava: The vessel was normal in size. The   respirophasic diameter changes were in the normal range (= 50%),   consistent with normal central venous pressure. Impressions: - Technically difficult study. Definity contrast given. LVEF   60-65%, normal wall motion, s/p Edwards Sapien 3 THV - no   obstruction, moderate LAE, pacer wires noted, mild TR, RVSP 52   mmHg, normal IVC.   ASSESSMENT & PLAN:   Severe AS s/p TAVR: 2D ECHO today shows EF 60% with normally functioning TAVR valve with no PVL and mean gradient 12 mm Hg. He has NYHA class I symptoms and is doing excellent with marked improvement in breathing. Making a slow but steady recovery from vascular surgery. SBE prophylaxis discussed; he has Amoxicillin. He is currently on Eliquis and ASA 81mg  daily. ASA 81 mg can be discontinued after 6 months of therapy in terms of his valve; however, Dr. Scot Dock has recommended that he remain  on Aspirin therapy given his carotid artery disease that may require revascularization.   Emergent Fem-fem bypass: making a steady recovery from this. Followed closely by Dr. Scot Dock  Chronic diastolic CHF: appears euvolemic. He would like to come off Lasix at some point. I have asked him to continue it for now.   Carotid artery disease: pre TAVR dopplers showed 80-99% RICA stenosis where he previously had R CEA. Dr. Scot Dock following. Plan for CT angio neck in 3 months with possible TECAR in future   HTN: BP has been high. His home Losartan 100 mg was stopped during hospital admission. I have asked him to resume this. BMET today and again in 2 weeks to follow K and renal function. Based on labs I may ask him to stop taking his potassium supplementation  [id number] Toprol XL and Eliquis  CAD s/p CABG x6V:  pre TAVR cath showed 6/6 patent bypass grafts. Continue medical therapy.    ADDENDUM:   Pulmonary nodules: seen on pre TAVR CT scans as an incidental finding. Likely benign but CT in 12 months recommended if high risk. This was not discussed with the patient, but I will address at 1 year follow up and order CT if indicated.   Medication Adjustments/Labs and Tests Ordered: Current medicines are reviewed at length with the patient today.  Concerns regarding medicines are outlined above.  Medication changes, Labs and Tests ordered today are listed in the Patient Instructions below. Patient Instructions  Medication Instructions:  Your physician has recommended you make the following change in your medication: Resume losartan 100 mg by mouth daily.    Labwork: Lab work to be done today--BMP and BNP.  Your physician recommends that you return for  lab work on October 2,2019. -BMP  The lab opens at 7:30 AM   Testing/Procedures: none  Follow-Up: You are scheduled to see Dr. Marlou Porch on December 20,2019 at 11:00  Any Other Special Instructions Will Be Listed Below (If  Applicable).  You will see K. Grandville Silos, PA in 11 months and have an echocardiogram done the same day.  We will call you to schedule these appointments   If you need a refill on your cardiac medications before your next appointment, please call your pharmacy.      Signed, Angelena Form, PA-C  05/18/2018 6:38 AM    [facility] [address], [place], [place]  [address] Phone: [phone]; Fax: [phone]

## 2018-05-16 DIAGNOSIS — K5190 Ulcerative colitis, unspecified, without complications: Secondary | ICD-10-CM | POA: Diagnosis not present

## 2018-05-16 DIAGNOSIS — Z48812 Encounter for surgical aftercare following surgery on the circulatory system: Secondary | ICD-10-CM | POA: Diagnosis not present

## 2018-05-16 DIAGNOSIS — I2510 Atherosclerotic heart disease of native coronary artery without angina pectoris: Secondary | ICD-10-CM | POA: Diagnosis not present

## 2018-05-16 DIAGNOSIS — I110 Hypertensive heart disease with heart failure: Secondary | ICD-10-CM | POA: Diagnosis not present

## 2018-05-16 DIAGNOSIS — I350 Nonrheumatic aortic (valve) stenosis: Secondary | ICD-10-CM | POA: Diagnosis not present

## 2018-05-16 DIAGNOSIS — I509 Heart failure, unspecified: Secondary | ICD-10-CM | POA: Diagnosis not present

## 2018-05-17 ENCOUNTER — Ambulatory Visit: Payer: Medicare Other | Admitting: Physician Assistant

## 2018-05-17 ENCOUNTER — Ambulatory Visit (INDEPENDENT_AMBULATORY_CARE_PROVIDER_SITE_OTHER): Payer: Medicare Other | Admitting: Physician Assistant

## 2018-05-17 ENCOUNTER — Ambulatory Visit (HOSPITAL_COMMUNITY): Payer: Medicare Other | Attending: Physician Assistant

## 2018-05-17 ENCOUNTER — Encounter: Payer: Self-pay | Admitting: Physician Assistant

## 2018-05-17 ENCOUNTER — Other Ambulatory Visit: Payer: Self-pay

## 2018-05-17 ENCOUNTER — Other Ambulatory Visit (HOSPITAL_COMMUNITY): Payer: Medicare Other

## 2018-05-17 VITALS — BP 170/62 | HR 63 | Ht 71.0 in | Wt 179.0 lb

## 2018-05-17 DIAGNOSIS — Z952 Presence of prosthetic heart valve: Secondary | ICD-10-CM | POA: Diagnosis not present

## 2018-05-17 DIAGNOSIS — Z951 Presence of aortocoronary bypass graft: Secondary | ICD-10-CM

## 2018-05-17 DIAGNOSIS — I509 Heart failure, unspecified: Secondary | ICD-10-CM | POA: Insufficient documentation

## 2018-05-17 DIAGNOSIS — I110 Hypertensive heart disease with heart failure: Secondary | ICD-10-CM | POA: Diagnosis not present

## 2018-05-17 DIAGNOSIS — Z95828 Presence of other vascular implants and grafts: Secondary | ICD-10-CM

## 2018-05-17 DIAGNOSIS — Z953 Presence of xenogenic heart valve: Secondary | ICD-10-CM | POA: Diagnosis not present

## 2018-05-17 DIAGNOSIS — I6523 Occlusion and stenosis of bilateral carotid arteries: Secondary | ICD-10-CM | POA: Diagnosis not present

## 2018-05-17 DIAGNOSIS — I081 Rheumatic disorders of both mitral and tricuspid valves: Secondary | ICD-10-CM | POA: Diagnosis not present

## 2018-05-17 DIAGNOSIS — I4891 Unspecified atrial fibrillation: Secondary | ICD-10-CM | POA: Diagnosis not present

## 2018-05-17 DIAGNOSIS — I2510 Atherosclerotic heart disease of native coronary artery without angina pectoris: Secondary | ICD-10-CM | POA: Insufficient documentation

## 2018-05-17 DIAGNOSIS — I5032 Chronic diastolic (congestive) heart failure: Secondary | ICD-10-CM | POA: Diagnosis not present

## 2018-05-17 DIAGNOSIS — I10 Essential (primary) hypertension: Secondary | ICD-10-CM

## 2018-05-17 DIAGNOSIS — Z87891 Personal history of nicotine dependence: Secondary | ICD-10-CM | POA: Insufficient documentation

## 2018-05-17 DIAGNOSIS — I6529 Occlusion and stenosis of unspecified carotid artery: Secondary | ICD-10-CM | POA: Insufficient documentation

## 2018-05-17 DIAGNOSIS — I480 Paroxysmal atrial fibrillation: Secondary | ICD-10-CM

## 2018-05-17 MED ORDER — PERFLUTREN LIPID MICROSPHERE
1.0000 mL | INTRAVENOUS | Status: AC | PRN
  Administered 2018-05-17: 2 mL via INTRAVENOUS

## 2018-05-17 MED ORDER — LOSARTAN POTASSIUM 100 MG PO TABS: 100.0000 mg | ORAL_TABLET | Freq: Every day | ORAL | 3 refills | Status: DC

## 2018-05-17 NOTE — Patient Instructions (Signed)
Medication Instructions:  Your physician has recommended you make the following change in your medication: Resume losartan 100 mg by mouth daily.    Labwork: Lab work to be done today--BMP and BNP.  Your physician recommends that you return for lab work on October 2,2019. -BMP  The lab opens at 7:30 AM   Testing/Procedures: none  Follow-Up: You are scheduled to see Dr. Marlou Porch on December 20,2019 at 11:00  Any Other Special Instructions Will Be Listed Below (If Applicable).  You will see K. Grandville Silos, PA in 11 months and have an echocardiogram done the same day.  We will call you to schedule these appointments   If you need a refill on your cardiac medications before your next appointment, please call your pharmacy.

## 2018-05-18 ENCOUNTER — Other Ambulatory Visit: Payer: Self-pay | Admitting: *Deleted

## 2018-05-18 DIAGNOSIS — K5190 Ulcerative colitis, unspecified, without complications: Secondary | ICD-10-CM | POA: Diagnosis not present

## 2018-05-18 DIAGNOSIS — Z48812 Encounter for surgical aftercare following surgery on the circulatory system: Secondary | ICD-10-CM | POA: Diagnosis not present

## 2018-05-18 DIAGNOSIS — I2510 Atherosclerotic heart disease of native coronary artery without angina pectoris: Secondary | ICD-10-CM | POA: Diagnosis not present

## 2018-05-18 DIAGNOSIS — I350 Nonrheumatic aortic (valve) stenosis: Secondary | ICD-10-CM | POA: Diagnosis not present

## 2018-05-18 DIAGNOSIS — I509 Heart failure, unspecified: Secondary | ICD-10-CM | POA: Diagnosis not present

## 2018-05-18 DIAGNOSIS — I110 Hypertensive heart disease with heart failure: Secondary | ICD-10-CM | POA: Diagnosis not present

## 2018-05-18 LAB — BASIC METABOLIC PANEL
BUN / CREAT RATIO: 19 (ref 10–24)
BUN: 20 mg/dL (ref 10–36)
CHLORIDE: 99 mmol/L (ref 96–106)
CO2: 24 mmol/L (ref 20–29)
Calcium: 10.2 mg/dL (ref 8.6–10.2)
Creatinine, Ser: 1.05 mg/dL (ref 0.76–1.27)
GFR, EST AFRICAN AMERICAN: 71 mL/min/{1.73_m2} (ref >59)
GFR, EST NON AFRICAN AMERICAN: 61 mL/min/{1.73_m2} (ref >59)
Glucose: 92 mg/dL (ref 65–99)
POTASSIUM: 4.2 mmol/L (ref 3.5–5.2)
Sodium: 141 mmol/L (ref 134–144)

## 2018-05-18 LAB — PRO B NATRIURETIC PEPTIDE: NT-PRO BNP: 3239 pg/mL — AB (ref 0–486)

## 2018-05-19 DIAGNOSIS — I350 Nonrheumatic aortic (valve) stenosis: Secondary | ICD-10-CM | POA: Diagnosis not present

## 2018-05-19 DIAGNOSIS — I2510 Atherosclerotic heart disease of native coronary artery without angina pectoris: Secondary | ICD-10-CM | POA: Diagnosis not present

## 2018-05-19 DIAGNOSIS — K5190 Ulcerative colitis, unspecified, without complications: Secondary | ICD-10-CM | POA: Diagnosis not present

## 2018-05-19 DIAGNOSIS — I509 Heart failure, unspecified: Secondary | ICD-10-CM | POA: Diagnosis not present

## 2018-05-19 DIAGNOSIS — Z48812 Encounter for surgical aftercare following surgery on the circulatory system: Secondary | ICD-10-CM | POA: Diagnosis not present

## 2018-05-19 DIAGNOSIS — I110 Hypertensive heart disease with heart failure: Secondary | ICD-10-CM | POA: Diagnosis not present

## 2018-05-23 DIAGNOSIS — I350 Nonrheumatic aortic (valve) stenosis: Secondary | ICD-10-CM | POA: Diagnosis not present

## 2018-05-23 DIAGNOSIS — I509 Heart failure, unspecified: Secondary | ICD-10-CM | POA: Diagnosis not present

## 2018-05-23 DIAGNOSIS — I110 Hypertensive heart disease with heart failure: Secondary | ICD-10-CM | POA: Diagnosis not present

## 2018-05-23 DIAGNOSIS — Z48812 Encounter for surgical aftercare following surgery on the circulatory system: Secondary | ICD-10-CM | POA: Diagnosis not present

## 2018-05-23 DIAGNOSIS — I2510 Atherosclerotic heart disease of native coronary artery without angina pectoris: Secondary | ICD-10-CM | POA: Diagnosis not present

## 2018-05-23 DIAGNOSIS — K5190 Ulcerative colitis, unspecified, without complications: Secondary | ICD-10-CM | POA: Diagnosis not present

## 2018-05-24 ENCOUNTER — Encounter: Payer: Self-pay | Admitting: Thoracic Surgery (Cardiothoracic Vascular Surgery)

## 2018-05-25 DIAGNOSIS — I2510 Atherosclerotic heart disease of native coronary artery without angina pectoris: Secondary | ICD-10-CM | POA: Diagnosis not present

## 2018-05-25 DIAGNOSIS — Z48812 Encounter for surgical aftercare following surgery on the circulatory system: Secondary | ICD-10-CM | POA: Diagnosis not present

## 2018-05-25 DIAGNOSIS — I350 Nonrheumatic aortic (valve) stenosis: Secondary | ICD-10-CM | POA: Diagnosis not present

## 2018-05-25 DIAGNOSIS — I509 Heart failure, unspecified: Secondary | ICD-10-CM | POA: Diagnosis not present

## 2018-05-25 DIAGNOSIS — K5190 Ulcerative colitis, unspecified, without complications: Secondary | ICD-10-CM | POA: Diagnosis not present

## 2018-05-25 DIAGNOSIS — I110 Hypertensive heart disease with heart failure: Secondary | ICD-10-CM | POA: Diagnosis not present

## 2018-05-29 DIAGNOSIS — Z48812 Encounter for surgical aftercare following surgery on the circulatory system: Secondary | ICD-10-CM | POA: Diagnosis not present

## 2018-05-29 DIAGNOSIS — I110 Hypertensive heart disease with heart failure: Secondary | ICD-10-CM | POA: Diagnosis not present

## 2018-05-29 DIAGNOSIS — I509 Heart failure, unspecified: Secondary | ICD-10-CM | POA: Diagnosis not present

## 2018-05-29 DIAGNOSIS — I2510 Atherosclerotic heart disease of native coronary artery without angina pectoris: Secondary | ICD-10-CM | POA: Diagnosis not present

## 2018-05-29 DIAGNOSIS — I350 Nonrheumatic aortic (valve) stenosis: Secondary | ICD-10-CM | POA: Diagnosis not present

## 2018-05-29 DIAGNOSIS — K5190 Ulcerative colitis, unspecified, without complications: Secondary | ICD-10-CM | POA: Diagnosis not present

## 2018-05-30 ENCOUNTER — Ambulatory Visit (INDEPENDENT_AMBULATORY_CARE_PROVIDER_SITE_OTHER): Payer: Medicare Other | Admitting: *Deleted

## 2018-05-30 DIAGNOSIS — I442 Atrioventricular block, complete: Secondary | ICD-10-CM | POA: Diagnosis not present

## 2018-05-31 ENCOUNTER — Other Ambulatory Visit: Payer: Medicare Other | Admitting: *Deleted

## 2018-05-31 DIAGNOSIS — I5032 Chronic diastolic (congestive) heart failure: Secondary | ICD-10-CM

## 2018-05-31 DIAGNOSIS — Z952 Presence of prosthetic heart valve: Secondary | ICD-10-CM

## 2018-05-31 DIAGNOSIS — I480 Paroxysmal atrial fibrillation: Secondary | ICD-10-CM | POA: Diagnosis not present

## 2018-05-31 LAB — BASIC METABOLIC PANEL
BUN/Creatinine Ratio: 19 (ref 10–24)
BUN: 18 mg/dL (ref 10–36)
CO2: 25 mmol/L (ref 20–29)
Calcium: 9.6 mg/dL (ref 8.6–10.2)
Chloride: 100 mmol/L (ref 96–106)
Creatinine, Ser: 0.94 mg/dL (ref 0.76–1.27)
GFR calc Af Amer: 80 mL/min/{1.73_m2} (ref >59)
GFR calc non Af Amer: 70 mL/min/{1.73_m2} (ref >59)
GLUCOSE: 86 mg/dL (ref 65–99)
POTASSIUM: 3.9 mmol/L (ref 3.5–5.2)
SODIUM: 139 mmol/L (ref 134–144)

## 2018-05-31 NOTE — Progress Notes (Signed)
Remote pacemaker transmission.   

## 2018-06-01 DIAGNOSIS — I2510 Atherosclerotic heart disease of native coronary artery without angina pectoris: Secondary | ICD-10-CM | POA: Diagnosis not present

## 2018-06-01 DIAGNOSIS — K5190 Ulcerative colitis, unspecified, without complications: Secondary | ICD-10-CM | POA: Diagnosis not present

## 2018-06-01 DIAGNOSIS — I110 Hypertensive heart disease with heart failure: Secondary | ICD-10-CM | POA: Diagnosis not present

## 2018-06-01 DIAGNOSIS — I509 Heart failure, unspecified: Secondary | ICD-10-CM | POA: Diagnosis not present

## 2018-06-01 DIAGNOSIS — Z48812 Encounter for surgical aftercare following surgery on the circulatory system: Secondary | ICD-10-CM | POA: Diagnosis not present

## 2018-06-01 DIAGNOSIS — I350 Nonrheumatic aortic (valve) stenosis: Secondary | ICD-10-CM | POA: Diagnosis not present

## 2018-06-01 NOTE — Telephone Encounter (Signed)
Remote transmission reviewed. Presenting rhythm: ApVp. 10% AT/AF burden, max dur. >96hrs (04/06/18), last 8/27. (4) VHR episodes, last 04/18/18.Stable lead measurements. Normal device function.  Informed patient about remote transmission results. Patient verbalized understanding and appreciation of information.

## 2018-06-05 LAB — CUP PACEART REMOTE DEVICE CHECK
Battery Remaining Longevity: 59 mo
Brady Statistic AS VP Percent: 47 %
Implantable Lead Implant Date: 20110725
Implantable Lead Location: 753860
Implantable Pulse Generator Implant Date: 20110725
Lead Channel Pacing Threshold Amplitude: 0.5 V
Lead Channel Pacing Threshold Amplitude: 0.75 V
Lead Channel Pacing Threshold Pulse Width: 0.4 ms
Lead Channel Setting Pacing Amplitude: 2 V
Lead Channel Setting Pacing Pulse Width: 0.4 ms
Lead Channel Setting Sensing Sensitivity: 2 mV
MDC IDC LEAD IMPLANT DT: 20110725
MDC IDC LEAD LOCATION: 753859
MDC IDC MSMT BATTERY IMPEDANCE: 973 Ohm
MDC IDC MSMT BATTERY VOLTAGE: 2.78 V
MDC IDC MSMT LEADCHNL RA IMPEDANCE VALUE: 548 Ohm
MDC IDC MSMT LEADCHNL RA PACING THRESHOLD PULSEWIDTH: 0.4 ms
MDC IDC MSMT LEADCHNL RV IMPEDANCE VALUE: 592 Ohm
MDC IDC SESS DTM: 20191001174344
MDC IDC SET LEADCHNL RV PACING AMPLITUDE: 2.5 V
MDC IDC STAT BRADY AP VP PERCENT: 48 %
MDC IDC STAT BRADY AP VS PERCENT: 1 %
MDC IDC STAT BRADY AS VS PERCENT: 4 %

## 2018-06-19 DIAGNOSIS — Z23 Encounter for immunization: Secondary | ICD-10-CM | POA: Diagnosis not present

## 2018-07-02 ENCOUNTER — Other Ambulatory Visit: Payer: Self-pay | Admitting: Cardiology

## 2018-07-18 DIAGNOSIS — Z961 Presence of intraocular lens: Secondary | ICD-10-CM | POA: Diagnosis not present

## 2018-07-18 DIAGNOSIS — H401131 Primary open-angle glaucoma, bilateral, mild stage: Secondary | ICD-10-CM | POA: Diagnosis not present

## 2018-07-18 DIAGNOSIS — H52203 Unspecified astigmatism, bilateral: Secondary | ICD-10-CM | POA: Diagnosis not present

## 2018-07-25 ENCOUNTER — Other Ambulatory Visit: Payer: Self-pay

## 2018-07-31 ENCOUNTER — Other Ambulatory Visit: Payer: Medicare Other

## 2018-07-31 ENCOUNTER — Encounter (HOSPITAL_COMMUNITY): Payer: Medicare Other

## 2018-07-31 ENCOUNTER — Ambulatory Visit
Admission: RE | Admit: 2018-07-31 | Discharge: 2018-07-31 | Disposition: A | Discharge status: Home / Self Care | Payer: Medicare Other | Source: Ambulatory Visit | Attending: Vascular Surgery | Admitting: Vascular Surgery

## 2018-07-31 DIAGNOSIS — I6523 Occlusion and stenosis of bilateral carotid arteries: Secondary | ICD-10-CM | POA: Diagnosis not present

## 2018-07-31 DIAGNOSIS — F325 Major depressive disorder, single episode, in full remission: Secondary | ICD-10-CM | POA: Diagnosis not present

## 2018-07-31 DIAGNOSIS — Z0000 Encounter for general adult medical examination without abnormal findings: Secondary | ICD-10-CM | POA: Diagnosis not present

## 2018-07-31 DIAGNOSIS — I10 Essential (primary) hypertension: Secondary | ICD-10-CM | POA: Diagnosis not present

## 2018-07-31 DIAGNOSIS — I6503 Occlusion and stenosis of bilateral vertebral arteries: Secondary | ICD-10-CM | POA: Diagnosis not present

## 2018-07-31 DIAGNOSIS — Z1389 Encounter for screening for other disorder: Secondary | ICD-10-CM | POA: Diagnosis not present

## 2018-07-31 MED ORDER — IOPAMIDOL (ISOVUE-370) INJECTION 76%
75.0000 mL | Freq: Once | INTRAVENOUS | Status: AC | PRN
  Administered 2018-07-31: 75 mL via INTRAVENOUS

## 2018-08-02 ENCOUNTER — Encounter (HOSPITAL_COMMUNITY): Payer: Medicare Other

## 2018-08-02 ENCOUNTER — Ambulatory Visit: Payer: Medicare Other | Admitting: Vascular Surgery

## 2018-08-09 ENCOUNTER — Encounter: Payer: Self-pay | Admitting: Vascular Surgery

## 2018-08-09 ENCOUNTER — Ambulatory Visit (INDEPENDENT_AMBULATORY_CARE_PROVIDER_SITE_OTHER): Payer: Medicare Other | Admitting: Vascular Surgery

## 2018-08-09 ENCOUNTER — Ambulatory Visit (HOSPITAL_COMMUNITY)
Admission: RE | Admit: 2018-08-09 | Discharge: 2018-08-09 | Disposition: A | Discharge status: Home / Self Care | Payer: Medicare Other | Source: Ambulatory Visit | Attending: Vascular Surgery | Admitting: Vascular Surgery

## 2018-08-09 ENCOUNTER — Other Ambulatory Visit: Payer: Self-pay

## 2018-08-09 ENCOUNTER — Ambulatory Visit (INDEPENDENT_AMBULATORY_CARE_PROVIDER_SITE_OTHER)
Admission: RE | Admit: 2018-08-09 | Discharge: 2018-08-09 | Disposition: A | Discharge status: Home / Self Care | Payer: Medicare Other | Source: Ambulatory Visit | Attending: Family | Admitting: Family

## 2018-08-09 VITALS — BP 156/70 | HR 67 | Resp 16 | Ht 71.0 in | Wt 184.0 lb

## 2018-08-09 DIAGNOSIS — Z95828 Presence of other vascular implants and grafts: Secondary | ICD-10-CM | POA: Diagnosis not present

## 2018-08-09 DIAGNOSIS — S35511D Injury of right iliac artery, subsequent encounter: Secondary | ICD-10-CM | POA: Diagnosis not present

## 2018-08-09 DIAGNOSIS — S35514D Injury of right iliac vein, subsequent encounter: Secondary | ICD-10-CM

## 2018-08-09 DIAGNOSIS — I6523 Occlusion and stenosis of bilateral carotid arteries: Secondary | ICD-10-CM | POA: Diagnosis not present

## 2018-08-09 DIAGNOSIS — Z48812 Encounter for surgical aftercare following surgery on the circulatory system: Secondary | ICD-10-CM

## 2018-08-09 NOTE — Progress Notes (Signed)
Patient name: Dalton Townsend MRN: [id number] DOB: 1923/05/16 Sex: male  REASON FOR VISIT:   Follow-up of the femorofemoral bypass and also right carotid stenosis.  HPI:   Dalton Townsend is a pleasant 84 y.o. male who I last saw on 05/03/2018.  This patient underwent a TAVR on 04/11/2018 and had an injury to the right external iliac artery.  There was also an injury to the vein.  The right external iliac vein and artery were oversewn and he then had endarterectomy of the right common femoral artery with a left to right femorofemoral bypass graft with an 8 mm Dacron graft.  The patient was also known to have a greater than 80% right carotid stenosis on his preoperative studies.  He had undergone previous right carotid endarterectomy in the past by Dr. Drucie Opitz.  He was asymptomatic from that standpoint.  His preoperative duplex scan showed a greater than 80% right carotid stenosis and a 40 to 59% left carotid stenosis.  He was set up for a 80-monthfollow-up duplex of his graft and ABIs.  In addition I recommended a CT angiogram of the neck to determine if he might be a candidate for a TCAR.   Since I saw him last, he denies any history of stroke, TIAs, expressive or receptive aphasia, or amaurosis fugax.  He denies any history of claudication, rest pain, or nonhealing ulcers.  He is on Eliquis and also on 81 mg of aspirin daily.   Past Medical History:  Diagnosis Date  . BPH (benign prostatic hyperplasia)   . CAD (coronary artery disease)    a. s/p CABG x6V in 1999, cath 03/2018 showed patent 6/6 bypass grafts  . [place] artery [place]    a. s/p R CEA, 80-99% restenosis of RICA, to be address after TAVR  . Dyslipidemia   . Essential hypertension   . GAD (generalized anxiety disorder)   . Intestinal infection due to Clostridium difficile 03/2010  . PAF (paroxysmal atrial fibrillation) (HCC)    a. on Eliquis  . Presence of permanent cardiac pacemaker   . PVD (peripheral  vascular disease) [place]   . S/P femoral-femoral bypass surgery    a. emergent fem fem bypass due to iliac complication during first attempt TAVR placement on 04/11/18 by Dr. DScot Dock . S/P TAVR (transcatheter aortic valve replacement)    a. 04/18/18: s/p succesful TAVR with a 2H[place]o3 THV  . Severe aortic stenosis   . Skin cancer   . Ulcerative colitis     Family History  Problem Relation Age of Onset  . Congestive Heart Failure Mother   . Leukemia Father   . Leukemia Unknown   . Heart failure Unknown   . Cancer Unknown     SOCIAL HISTORY: Social History   Tobacco Use  . Smoking status: Former Smoker    Years: 10.00    Types: Cigarettes    Last attempt to quit: 08/30/1952    Years since quitting: 65.9  . Smokeless tobacco: Former UNetwork engineerUse Topics  . Alcohol use: No    Allergies  Allergen Reactions  . No Known Allergies     Current Outpatient Medications  Medication Sig Dispense Refill  . acetaminophen (TYLENOL) 500 MG tablet Take 1,000 mg by mouth at bedtime.    .Marland KitchenamLODipine (NORVASC) 5 MG tablet Take 5 mg by mouth daily.    .Marland Kitchenaspirin 81 MG chewable tablet Chew 1 tablet (81 mg total) by  mouth daily.    [name] atorvastatin (LIPITOR) 20 MG tablet Take 20 mg by mouth every other day.     . balsalazide (COLAZAL) 750 MG capsule Take 2,250 mg by mouth 3 (three) times daily.    [name] ELIQUIS 5 MG TABS tablet TAKE 1 TABLET TWICE A DAY 180 tablet 1  . escitalopram (LEXAPRO) 10 MG tablet Take 10 mg by mouth daily.      . finasteride (PROSCAR) 5 MG tablet Take 5 mg by mouth See admin instructions. Take one tablet (5 mg) by mouth every day except Wednesdays    . furosemide (LASIX) 40 MG tablet Take 1 tablet (40 mg total) by mouth daily. 30 tablet 1  . latanoprost (XALATAN) 0.005 % ophthalmic solution Place 1 drop into both eyes at bedtime.     [name] losartan (COZAAR) 100 MG tablet Take 1 tablet (100 mg total) by mouth daily.  3  . mesalamine (CANASA) 1000 MG suppository  Place 1,000 mg rectally at bedtime as needed (ulcerative colitis).    . metoprolol succinate (TOPROL-XL) 50 MG 24 hr tablet Take 50 mg by mouth daily. Take with or immediately following a meal.    . Multiple Vitamin (MULTIVITAMIN WITH MINERALS) TABS tablet Take 1 tablet by mouth daily.    [name] amoxicillin (AMOXIL) 500 MG capsule DNC  2   No current facility-administered medications for this visit.     REVIEW OF SYSTEMS:  [X]  denotes positive finding, [ ]  denotes negative finding Cardiac  Comments:  Chest pain or chest pressure:    Shortness of breath upon exertion: x   Short of breath when lying flat:    Irregular heart rhythm:        Vascular    Pain in calf, thigh, or hip brought on by ambulation:    Pain in feet at night that wakes you up from your sleep:     Blood clot in your veins:    Leg swelling:         Pulmonary    Oxygen at home:    Productive cough:     Wheezing:         Neurologic    Sudden weakness in arms or legs:     Sudden numbness in arms or legs:     Sudden onset of difficulty speaking or slurred speech:    Temporary loss of vision in one eye:     Problems with dizziness:         Gastrointestinal    Blood in stool:     Vomited blood:         Genitourinary    Burning when urinating:     Blood in urine:        Psychiatric    Major depression:         Hematologic    Bleeding problems:    Problems with blood clotting too easily:        Skin    Rashes or ulcers:        Constitutional    Fever or chills:     PHYSICAL EXAM:   Vitals:   08/09/18 1519  Resp: 16  SpO2: 100%    GENERAL: The patient is a well-nourished male, in no acute distress. The vital signs are documented above. CARDIAC: There is a regular rate and rhythm.  VASCULAR: I do not detect carotid bruits. He has palpable femoral pulses bilaterally.  I cannot palpate pedal pulses. PULMONARY: There is good air exchange bilaterally  without wheezing or rales. ABDOMEN: Soft and  non-tender with normal pitched bowel sounds.  MUSCULOSKELETAL: There are no major deformities or cyanosis. NEUROLOGIC: No focal weakness or paresthesias are detected. SKIN: There are no ulcers or rashes noted. PSYCHIATRIC: The patient has a normal affect.  DATA:    ARTERIAL DOPPLER STUDY: I have independently interpreted his arterial Doppler study today.  On the right side there is a biphasic dorsalis pedis and posterior tibial signal.  ABI is 100%.  Toe pressure is 98 mmHg.  On the left side there is a monophasic dorsalis pedis signal with a biphasic posterior tibial signal.  ABI is 82% with a toe pressure of 110 mmHg.  GRAFT DUPLEX: I have independently interpreted his graft duplex scan today.  His left to right femorofemoral bypass graft is widely patent with biphasic signals throughout and no areas of stenosis noted.  MEDICAL ISSUES:   STATUS POST LEFT TO RIGHT FEMOROFEMORAL BYPASS: The patient is doing well status post emergent left to right femorofemoral bypass.  His bypass graft is patent and he is asymptomatic.  ABI is 100% on the right and 82% on the left.  He is not a smoker.  I encouraged him to stay as active as possible.  I ordered follow-up ABIs in 6 months and I will see him back at that time.  GREATER THAN 80% RIGHT CAROTID STENOSIS: His preoperative study prior to his TAVR showed a greater than 80% asymptomatic right carotid stenosis.  He has had previous right carotid endarterectomy by Dr. Drucie Opitz in the past.  He is asymptomatic.  His CT scan shows that he might potentially be a candidate for a TCAR however there is some calcific plaque in the common carotid artery which I think would increase the risk slightly.  Regardless given that he is asymptomatic and 84 years old I would not favor an aggressive approach currently.  I think he would be at increased risk for surgery because of his age, the calcific disease in his common carotid artery, and limited neck mobility.  I have  ordered a follow-up carotid duplex scan in 6 months and I will see him back at that time.  I would recommend that he continue his 81 mg of aspirin daily.  He is also on a statin.  We have reviewed the potential symptoms of cerebrovascular disease and he knows to call if he develops any such symptoms.  Deitra Mayo [facility] [phone]

## 2018-08-18 ENCOUNTER — Encounter: Payer: Self-pay | Admitting: Cardiology

## 2018-08-18 ENCOUNTER — Ambulatory Visit (INDEPENDENT_AMBULATORY_CARE_PROVIDER_SITE_OTHER): Payer: Medicare Other | Admitting: Cardiology

## 2018-08-18 VITALS — BP 140/70 | HR 82 | Ht 71.0 in | Wt 180.1 lb

## 2018-08-18 DIAGNOSIS — Z952 Presence of prosthetic heart valve: Secondary | ICD-10-CM | POA: Diagnosis not present

## 2018-08-18 DIAGNOSIS — Z950 Presence of cardiac pacemaker: Secondary | ICD-10-CM

## 2018-08-18 DIAGNOSIS — Z95828 Presence of other vascular implants and grafts: Secondary | ICD-10-CM

## 2018-08-18 DIAGNOSIS — I442 Atrioventricular block, complete: Secondary | ICD-10-CM | POA: Diagnosis not present

## 2018-08-18 DIAGNOSIS — I6523 Occlusion and stenosis of bilateral carotid arteries: Secondary | ICD-10-CM

## 2018-08-18 DIAGNOSIS — I10 Essential (primary) hypertension: Secondary | ICD-10-CM | POA: Diagnosis not present

## 2018-08-18 NOTE — Progress Notes (Signed)
Cardiology Office Note:    Date:  08/18/2018   ID:  Dalton Townsend, DOB 10-07-1922, MRN [id number]  PCP:  Lajean Manes, MD  Cardiologist:  Candee Furbish, MD  Electrophysiologist:  None   Referring MD: Lajean Manes, MD     History of Present Illness:    Dalton Townsend is a 84 y.o. male here for follow-up of coronary artery disease status post CABG in 1999 with catheterization 03/2018 showing all patent grafts with carotid artery disease 99% restenosis of right internal carotid artery,  as well status post emergent femoral bypass surgery following original attempt at TAVR, status post TAVR 26 mm Edwards AP and 3 valve on August 2019, transapical approach, pacemaker, paroxysmal atrial fibrillation 10% burden.  In review of prior office note post TAVR on 05/17/2018 he was walking with a walker at home sometimes with a cane but breathing much better no edema.  Improved.  Doing quite well.  Was quite thankful to the team in the hospital.  His peripheral arterial disease femorofemoral bypass was an emergent bypass, Dr. Scot Dock, following attempt at transfemoral TAVR  Nuys any fevers chills nausea vomiting syncope bleeding.    Past Medical History:  Diagnosis Date  . BPH (benign prostatic hyperplasia)   . CAD (coronary artery disease)    a. s/p CABG x6V in 1999, cath 03/2018 showed patent 6/6 bypass grafts  . Carotid artery disease ([place])    a. s/p R CEA, 80-99% restenosis of RICA, to be address after TAVR  . Dyslipidemia   . Essential hypertension   . GAD (generalized anxiety disorder)   . Intestinal infection due to Clostridium difficile 03/2010  . PAF (paroxysmal atrial fibrillation) (HCC)    a. on Eliquis  . Presence of permanent cardiac pacemaker   . PVD (peripheral vascular disease) ([place])   . S/P femoral-femoral bypass surgery    a. emergent fem fem bypass due to iliac complication during first attempt TAVR placement on 04/11/18 by Dr. Scot Dock  . S/P TAVR (transcatheter  aortic valve replacement)    a. 04/18/18: s/p succesful TAVR with a [place] 3 THV  . Severe aortic stenosis   . Skin cancer   . Ulcerative colitis     Past Surgical History:  Procedure Laterality Date  . BALLOON AORTIC VALVE VALVULOPLASTY N/A 04/11/2018   Procedure: BALLOON AORTIC VALVE VALVULOPLASTY.  RIGHT EXTERNAL ILIAC ARTERY OVERSEW.;  Surgeon: Sherren Mocha, MD;  Location: [place];  Service: Open Heart Surgery;  Laterality: N/A;  . CARDIAC CATHETERIZATION  1999  . CAROTID ENDARTERECTOMY Right 2004  . CATARACT EXTRACTION, BILATERAL  2011  . CORONARY ARTERY BYPASS GRAFT  04/1998   3 vessel CAD  . ENDARTERECTOMY FEMORAL Right 04/11/2018   Procedure: ENDARTERECTOMY FEMORAL;  Surgeon: Sherren Mocha, MD;  Location: [place];  Service: Open Heart Surgery;  Laterality: Right;  . FEMORAL-FEMORAL BYPASS GRAFT N/A 04/11/2018   Procedure: LEFT TO RIGHT BYPASS GRAFT FEMORAL-FEMORAL ARTERY;  Surgeon: Sherren Mocha, MD;  Location: [place];  Service: Open Heart Surgery;  Laterality: N/A;  . INCISION AND DRAINAGE OF WOUND Left 05/04/2016   Procedure: SURGICAL PREP FOR GRAFTING LEFT LEG WITH THERA SKIN APPLICATION;  Surgeon: Irene Limbo, MD;  Location: [place];  Service: Plastics;  Laterality: Left;  . INTRAOPERATIVE ARTERIOGRAM  06/2000  . INTRAOPERATIVE TRANSTHORACIC ECHOCARDIOGRAM  04/11/2018   Procedure: INTRAOPERATIVE TRANSTHORACIC ECHOCARDIOGRAM;  Surgeon: Sherren Mocha, MD;  Location: [facility] OR;  Service: Open Heart Surgery;;  . PACEMAKER INSERTION  03/25/2010  .  RIGHT/LEFT HEART CATH AND CORONARY/GRAFT ANGIOGRAPHY N/A 04/05/2018   Procedure: RIGHT/LEFT HEART CATH AND CORONARY/GRAFT ANGIOGRAPHY;  Surgeon: Burnell Blanks, MD;  Location: [place] [facility];  Service: Cardiovascular;  Laterality: N/A;  . TEE WITHOUT CARDIOVERSION N/A 04/18/2018   Procedure: TRANSESOPHAGEAL ECHOCARDIOGRAM (TEE);  Surgeon: Sherren Mocha, MD;  Location: [place];  Service: Open Heart Surgery;  Laterality:  N/A;  . TOTAL KNEE ARTHROPLASTY  07/16/09  . TRANSCATHETER AORTIC VALVE REPLACEMENT, TRANSAPICAL N/A 04/18/2018   Procedure: TRANSCATHETER AORTIC VALVE REPLACEMENT, TRANSAPICAL. 57m EDWARDS SAPIEN 3 TRANSCATHETER HEART VALVE.;  Surgeon: CSherren Mocha MD;  [place]  Service: Open Heart Surgery;  Laterality: N/A;  . TRANSURETHRAL RESECTION OF PROSTATE  1998    Current Medications: Current Meds  Medication Sig  . acetaminophen (TYLENOL) 500 MG tablet Take 1,000 mg by mouth at bedtime.  .Marland KitchenamLODipine (NORVASC) 5 MG tablet Take 5 mg by mouth daily.  .Marland Kitchenamoxicillin (AMOXIL) 500 MG capsule Take 500 mg by mouth 4 (four) times daily as needed. Patient reports taking 4 capsules before any dental procedure.  .Marland Kitchenaspirin 81 MG chewable tablet Chew 1 tablet (81 mg total) by mouth daily.  .Marland Kitchenatorvastatin (LIPITOR) 20 MG tablet Take 20 mg by mouth every other day.   . balsalazide (COLAZAL) 750 MG capsule Take 2,250 mg by mouth 3 (three) times daily.  .Marland KitchenELIQUIS 5 MG TABS tablet TAKE 1 TABLET TWICE A DAY  . escitalopram (LEXAPRO) 10 MG tablet Take 10 mg by mouth daily.    . finasteride (PROSCAR) 5 MG tablet Take 5 mg by mouth See admin instructions. Take one tablet (5 mg) by mouth every day except Wednesdays  . furosemide (LASIX) 40 MG tablet Take 1 tablet (40 mg total) by mouth daily.  .Marland Kitchenlatanoprost (XALATAN) 0.005 % ophthalmic solution Place 1 drop into both eyes at bedtime.   .Marland Kitchenlosartan (COZAAR) 100 MG tablet Take 1 tablet (100 mg total) by mouth daily.  . mesalamine (CANASA) 1000 MG suppository Place 1,000 mg rectally at bedtime as needed (ulcerative colitis).  . metoprolol succinate (TOPROL-XL) 50 MG 24 hr tablet Take 50 mg by mouth daily. Take with or immediately following a meal.  . Multiple Vitamin (MULTIVITAMIN WITH MINERALS) TABS tablet Take 1 tablet by mouth daily.     Allergies:   No known allergies   Social History   Socioeconomic History  . Marital status: Married    Spouse name:  Not on file  . Number of children: 3  . Years of education: Not on file  . Highest education level: Not on file  Occupational History  . Occupation: retired  SScientific laboratory technician . Financial resource strain: Not on file  . Food insecurity:    Worry: Not on file    Inability: Not on file  . Transportation needs:    Medical: Not on file    Non-medical: Not on file  Tobacco Use  . Smoking status: Former Smoker    Years: 10.00    Types: Cigarettes    Last attempt to quit: 08/30/1952    Years since quitting: 66.0  . Smokeless tobacco: Former UNetwork engineerand Sexual Activity  . Alcohol use: No  . Drug use: No  . Sexual activity: Not on file  Lifestyle  . Physical activity:    Days per week: Not on file    Minutes per session: Not on file  . Stress: Not on file  Relationships  . Social connections:  Talks on phone: Not on file    Gets together: Not on file    Attends religious service: Not on file    Active member of club or organization: Not on file    Attends meetings of clubs or organizations: Not on file    Relationship status: Not on file  Other Topics Concern  . Not on file  Social History Narrative  . Not on file     Family History: The patient's family history includes Cancer in his unknown relative; Congestive Heart Failure in his mother; Heart failure in his unknown relative; Leukemia in his father and unknown relative.  ROS:   Please see the history of present illness.     All other systems reviewed and are negative.  EKGs/Labs/Other Studies Reviewed:    The following studies were reviewed today: Hospital records lab work EKG echo  EKG:  EKG is not ordered today.    Recent Labs: 04/07/2018: B Natriuretic Peptide 461.7 04/19/2018: Magnesium 2.1 04/27/2018: Hemoglobin 9.4; Platelets 192 05/17/2018: NT-Pro BNP 3,239 05/31/2018: BUN 18; Creatinine, Ser 0.94; Potassium 3.9; Sodium 139  Recent Lipid Panel No results found for: CHOL, TRIG, HDL, CHOLHDL, VLDL,  LDLCALC, LDLDIRECT  Physical Exam:    VS:  BP 140/70   Pulse 82   Ht 5' 11"  (1.803 m)   Wt 180 lb 1.9 oz (81.7 kg)   SpO2 99%   BMI 25.12 kg/m     Wt Readings from Last 3 Encounters:  08/18/18 180 lb 1.9 oz (81.7 kg)  08/09/18 184 lb (83.5 kg)  05/17/18 179 lb (81.2 kg)     GEN: Elderly but looks younger than stated age well nourished, well developed in no acute distress HEENT: Normal NECK: No JVD; No carotid bruits LYMPHATICS: No lymphadenopathy CARDIAC: RRR, soft systolic murmur,no rubs, gallops RESPIRATORY:  Clear to auscultation without rales, wheezing or rhonchi  ABDOMEN: Soft, non-tender, non-distended MUSCULOSKELETAL:  No edema; No deformity  SKIN: Warm and dry NEUROLOGIC:  Alert and oriented x 3 PSYCHIATRIC:  Normal affect   ASSESSMENT:    1. S/P TAVR (transcatheter aortic valve replacement)   2. S/P femoral-femoral bypass surgery   3. Complete heart block ([place])   4. Essential hypertension   5. Cardiac pacemaker in situ   6. Bilateral carotid artery stenosis    PLAN:    In order of problems listed above:  Severe aortic stenosis status post TAVR -EF 60 normal function mean gradient 12 class I symptoms dental prophylaxis amoxicillin. - Currently on Eliquis and aspirin 81 mg.  Thoughts are to discontinue the aspirin after 6 months post TAVR.  However, Dr. Scot Dock had recommended that he remain on aspirin therapy given his carotid artery disease that may require revascularization.  Watch for any signs of bleeding.  Carotid artery disease - Pre-TAVR Doppler showed 80 to 99% R ICA stenosis where prior carotid endarterectomy took place.  Dr. Scot Dock will be monitoring.  Essential hypertension - Better control, currently 140/70 today.  Coronary artery disease status post CABG x6 - All patent grafts prior to TAVR.  Paroxysmal atrial fibrillation -Eliquis for anticoagulation.  Also on aspirin given his concomitant valve as well as carotid artery  disease  Pulmonary nodule - 1 year follow-up recommended if high risk.   Medication Adjustments/Labs and Tests Ordered: Current medicines are reviewed at length with the patient today.  Concerns regarding medicines are outlined above.  No orders of the defined types were placed in this encounter.  No orders of the  defined types were placed in this encounter.   Patient Instructions  Medication Instructions:  The current medical regimen is effective;  continue present plan and medications.  If you need a refill on your cardiac medications before your next appointment, please call your pharmacy.   Follow-Up: At [facility], you and your health needs are our priority.  As part of our continuing mission to provide you with exceptional heart care, we have created designated Provider Care Teams.  These Care Teams include your primary Cardiologist (physician) and Advanced Practice Providers (APPs -  Physician Assistants and Nurse Practitioners) who all work together to provide you with the care you need, when you need it. You will need a follow up appointment in 6 months.  Please call our office 2 months in advance to schedule this appointment.  You may see Candee Furbish, MD or one of the following Advanced Practice Providers on your designated Care Team:   Truitt Merle, NP Cecilie Kicks, NP . Kathyrn Drown, NP  Thank you for choosing [facility]!!         Signed, Candee Furbish, MD  08/18/2018 11:39 AM    [place]

## 2018-08-18 NOTE — Patient Instructions (Signed)
Medication Instructions:  The current medical regimen is effective;  continue present plan and medications.  If you need a refill on your cardiac medications before your next appointment, please call your pharmacy.   Follow-Up: At [facility], you and your health needs are our priority.  As part of our continuing mission to provide you with exceptional heart care, we have created designated Provider Care Teams.  These Care Teams include your primary Cardiologist (physician) and Advanced Practice Providers (APPs -  Physician Assistants and Nurse Practitioners) who all work together to provide you with the care you need, when you need it. You will need a follow up appointment in 6 months.  Please call our office 2 months in advance to schedule this appointment.  You may see Mark Skains, MD or one of the following Advanced Practice Providers on your designated Care Team:   Lori Gerhardt, NP Laura Ingold, NP . Jill McDaniel, NP  Thank you for choosing [facility]!!       

## 2018-08-29 ENCOUNTER — Ambulatory Visit (INDEPENDENT_AMBULATORY_CARE_PROVIDER_SITE_OTHER): Payer: Medicare Other

## 2018-08-29 DIAGNOSIS — I442 Atrioventricular block, complete: Secondary | ICD-10-CM

## 2018-08-29 LAB — CUP PACEART REMOTE DEVICE CHECK
Battery Remaining Longevity: 53 mo
Battery Voltage: 2.78 V
Brady Statistic AP VS Percent: 2 %
Brady Statistic AS VS Percent: 5 %
Date Time Interrogation Session: 20191231135729
Implantable Lead Implant Date: 20110725
Implantable Lead Implant Date: 20110725
Implantable Lead Location: 753859
Implantable Lead Location: 753860
Implantable Lead Model: 5076
Implantable Lead Model: 5076
Implantable Pulse Generator Implant Date: 20110725
Lead Channel Impedance Value: 606 Ohm
Lead Channel Pacing Threshold Amplitude: 0.625 V
Lead Channel Pacing Threshold Pulse Width: 0.4 ms
Lead Channel Pacing Threshold Pulse Width: 0.4 ms
Lead Channel Setting Pacing Amplitude: 2 V
Lead Channel Setting Pacing Amplitude: 2.5 V
Lead Channel Setting Pacing Pulse Width: 0.4 ms
Lead Channel Setting Sensing Sensitivity: 2.8 mV
MDC IDC MSMT BATTERY IMPEDANCE: 1104 Ohm
MDC IDC MSMT LEADCHNL RA IMPEDANCE VALUE: 488 Ohm
MDC IDC MSMT LEADCHNL RA PACING THRESHOLD AMPLITUDE: 0.375 V
MDC IDC STAT BRADY AP VP PERCENT: 62 %
MDC IDC STAT BRADY AS VP PERCENT: 31 %

## 2018-08-29 NOTE — Progress Notes (Signed)
Remote pacemaker transmission.   

## 2018-09-07 ENCOUNTER — Emergency Department (HOSPITAL_COMMUNITY): Payer: Medicare Other

## 2018-09-07 ENCOUNTER — Emergency Department (HOSPITAL_COMMUNITY)
Admission: EM | Admit: 2018-09-07 | Discharge: 2018-09-07 | Disposition: A | Discharge status: Home / Self Care | Payer: Medicare Other | Attending: Emergency Medicine | Admitting: Emergency Medicine

## 2018-09-07 ENCOUNTER — Other Ambulatory Visit: Payer: Self-pay

## 2018-09-07 ENCOUNTER — Encounter (HOSPITAL_COMMUNITY): Payer: Self-pay

## 2018-09-07 DIAGNOSIS — T148XXA Other injury of unspecified body region, initial encounter: Secondary | ICD-10-CM

## 2018-09-07 DIAGNOSIS — S3993XA Unspecified injury of pelvis, initial encounter: Secondary | ICD-10-CM | POA: Diagnosis not present

## 2018-09-07 DIAGNOSIS — S5012XA Contusion of left forearm, initial encounter: Secondary | ICD-10-CM | POA: Diagnosis not present

## 2018-09-07 DIAGNOSIS — Z87891 Personal history of nicotine dependence: Secondary | ICD-10-CM | POA: Insufficient documentation

## 2018-09-07 DIAGNOSIS — S0990XA Unspecified injury of head, initial encounter: Secondary | ICD-10-CM | POA: Diagnosis not present

## 2018-09-07 DIAGNOSIS — Y939 Activity, unspecified: Secondary | ICD-10-CM | POA: Insufficient documentation

## 2018-09-07 DIAGNOSIS — Y999 Unspecified external cause status: Secondary | ICD-10-CM | POA: Insufficient documentation

## 2018-09-07 DIAGNOSIS — I10 Essential (primary) hypertension: Secondary | ICD-10-CM | POA: Diagnosis not present

## 2018-09-07 DIAGNOSIS — S299XXA Unspecified injury of thorax, initial encounter: Secondary | ICD-10-CM | POA: Diagnosis not present

## 2018-09-07 DIAGNOSIS — S199XXA Unspecified injury of neck, initial encounter: Secondary | ICD-10-CM | POA: Diagnosis not present

## 2018-09-07 DIAGNOSIS — Z23 Encounter for immunization: Secondary | ICD-10-CM | POA: Diagnosis not present

## 2018-09-07 DIAGNOSIS — S0012XA Contusion of left eyelid and periocular area, initial encounter: Secondary | ICD-10-CM | POA: Diagnosis not present

## 2018-09-07 DIAGNOSIS — Z7982 Long term (current) use of aspirin: Secondary | ICD-10-CM | POA: Insufficient documentation

## 2018-09-07 DIAGNOSIS — I4510 Unspecified right bundle-branch block: Secondary | ICD-10-CM | POA: Diagnosis not present

## 2018-09-07 DIAGNOSIS — R52 Pain, unspecified: Secondary | ICD-10-CM | POA: Diagnosis not present

## 2018-09-07 DIAGNOSIS — W2209XA Striking against other stationary object, initial encounter: Secondary | ICD-10-CM | POA: Diagnosis not present

## 2018-09-07 DIAGNOSIS — Z79899 Other long term (current) drug therapy: Secondary | ICD-10-CM | POA: Insufficient documentation

## 2018-09-07 DIAGNOSIS — W19XXXA Unspecified fall, initial encounter: Secondary | ICD-10-CM | POA: Diagnosis not present

## 2018-09-07 DIAGNOSIS — R000 Tachycardia, unspecified: Secondary | ICD-10-CM | POA: Diagnosis not present

## 2018-09-07 DIAGNOSIS — Y929 Unspecified place or not applicable: Secondary | ICD-10-CM | POA: Diagnosis not present

## 2018-09-07 DIAGNOSIS — S0512XA Contusion of eyeball and orbital tissues, left eye, initial encounter: Secondary | ICD-10-CM | POA: Diagnosis not present

## 2018-09-07 LAB — URINALYSIS, ROUTINE W REFLEX MICROSCOPIC
Bacteria, UA: NONE SEEN
Bilirubin Urine: NEGATIVE
Glucose, UA: NEGATIVE mg/dL
Hgb urine dipstick: NEGATIVE
Ketones, ur: NEGATIVE mg/dL
LEUKOCYTES UA: NEGATIVE
Nitrite: NEGATIVE
Protein, ur: 30 mg/dL — AB
Specific Gravity, Urine: 1.01 (ref 1.005–1.030)
pH: 7 (ref 5.0–8.0)

## 2018-09-07 LAB — CBC
HCT: 35.2 % — ABNORMAL LOW (ref 39.0–52.0)
Hemoglobin: 11.4 g/dL — ABNORMAL LOW (ref 13.0–17.0)
MCH: 30 pg (ref 26.0–34.0)
MCHC: 32.4 g/dL (ref 30.0–36.0)
MCV: 92.6 fL (ref 80.0–100.0)
NRBC: 0 % (ref 0.0–0.2)
PLATELETS: 143 10*3/uL — AB (ref 150–400)
RBC: 3.8 MIL/uL — ABNORMAL LOW (ref 4.22–5.81)
RDW: 16.3 % — ABNORMAL HIGH (ref 11.5–15.5)
WBC: 6.2 10*3/uL (ref 4.0–10.5)

## 2018-09-07 LAB — I-STAT CHEM 8, ED
BUN: 27 mg/dL — ABNORMAL HIGH (ref 8–23)
Calcium, Ion: 1.23 mmol/L (ref 1.15–1.40)
Chloride: 103 mmol/L (ref 98–111)
Creatinine, Ser: 1 mg/dL (ref 0.61–1.24)
Glucose, Bld: 120 mg/dL — ABNORMAL HIGH (ref 70–99)
HCT: 36 % — ABNORMAL LOW (ref 39.0–52.0)
Hemoglobin: 12.2 g/dL — ABNORMAL LOW (ref 13.0–17.0)
Potassium: 3.6 mmol/L (ref 3.5–5.1)
Sodium: 138 mmol/L (ref 135–145)
TCO2: 26 mmol/L (ref 22–32)

## 2018-09-07 LAB — COMPREHENSIVE METABOLIC PANEL
ALBUMIN: 4.3 g/dL (ref 3.5–5.0)
ALT: 11 U/L (ref 0–44)
AST: 20 U/L (ref 15–41)
Alkaline Phosphatase: 37 U/L — ABNORMAL LOW (ref 38–126)
Anion gap: 10 (ref 5–15)
BUN: 25 mg/dL — ABNORMAL HIGH (ref 8–23)
CO2: 23 mmol/L (ref 22–32)
Calcium: 9.5 mg/dL (ref 8.9–10.3)
Chloride: 103 mmol/L (ref 98–111)
Creatinine, Ser: 1.06 mg/dL (ref 0.61–1.24)
GFR calc Af Amer: >60 mL/min (ref >60)
GFR calc non Af Amer: >60 mL/min (ref >60)
Glucose, Bld: 122 mg/dL — ABNORMAL HIGH (ref 70–99)
Potassium: 3.6 mmol/L (ref 3.5–5.1)
SODIUM: 136 mmol/L (ref 135–145)
Total Bilirubin: 0.9 mg/dL (ref 0.3–1.2)
Total Protein: 7 g/dL (ref 6.5–8.1)

## 2018-09-07 LAB — I-STAT CG4 LACTIC ACID, ED: Lactic Acid, Venous: 1.02 mmol/L (ref 0.5–1.9)

## 2018-09-07 LAB — PROTIME-INR
INR: 1.11
Prothrombin Time: 14.2 seconds (ref 11.4–15.2)

## 2018-09-07 MED ORDER — TETANUS-DIPHTH-ACELL PERTUSSIS 5-2.5-18.5 LF-MCG/0.5 IM SUSP
0.5000 mL | Freq: Once | INTRAMUSCULAR | Status: AC
  Administered 2018-09-07: 0.5 mL via INTRAMUSCULAR
  Filled 2018-09-07: qty 0.5

## 2018-09-07 MED ORDER — OXYCODONE-ACETAMINOPHEN 5-325 MG PO TABS
1.0000 | ORAL_TABLET | Freq: Once | ORAL | Status: AC
  Administered 2018-09-07: 1 via ORAL
  Filled 2018-09-07: qty 1

## 2018-09-07 NOTE — ED Notes (Signed)
Patient transported to CT 

## 2018-09-07 NOTE — ED Provider Notes (Addendum)
[facility] EMERGENCY DEPARTMENT Provider Note   CSN: [id number] Arrival date & time: 09/07/18  1742     History   Chief Complaint Chief Complaint  Patient presents with  . Fall    HPI Dalton Townsend is a 85 y.o. male.  The history is provided by the patient and the EMS personnel. No language interpreter was used.  Fall  This is a new problem. The current episode started less than 1 hour ago. The problem occurs rarely. The problem has not changed since onset.Associated symptoms include headaches. Pertinent negatives include no chest pain, no abdominal pain and no shortness of breath. Nothing aggravates the symptoms. Nothing relieves the symptoms. He has tried nothing for the symptoms. The treatment provided no relief.   Patient states he was taking out the garbage when his garbage can begin to roll away and he grabbed a handle with the garbage can pull him onto the pavement.  He states he struck the left side of his face and his left forearm.  He denies any LOC or subsequent vomiting.  Denies any acute neck or back pain, chest pain, abdominal pain, syncope, shortness of breath, abdominal pain, focal extremity weakness, or other acute traumatic injuries.  States he is compliant with his Eliquis for his A. fib.  Denies prior similar episodes.  Denies alleviating or aggravating factors.  Past Medical History:  Diagnosis Date  . BPH (benign prostatic hyperplasia)   . CAD (coronary artery disease)    a. s/p CABG x6V in 1999, cath 03/2018 showed patent 6/6 bypass grafts  . Carotid artery disease ([place])    a. s/p R CEA, 80-99% restenosis of RICA, to be address after TAVR  . Dyslipidemia   . Essential hypertension   . GAD (generalized anxiety disorder)   . Intestinal infection due to Clostridium difficile 03/2010  . PAF (paroxysmal atrial fibrillation) (HCC)    a. on Eliquis  . Presence of permanent cardiac pacemaker   . PVD (peripheral vascular disease) ([place])   .  S/P femoral-femoral bypass surgery    a. emergent fem fem bypass due to iliac complication during first attempt TAVR placement on 04/11/18 by Dr. Scot Dock  . S/P TAVR (transcatheter aortic valve replacement)    a. 04/18/18: s/p succesful TAVR with a Coqui 3 THV  . Severe aortic stenosis   . Skin cancer   . Ulcerative colitis     Patient Active Problem List   Diagnosis Date Noted  . 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 S/P femoral-femoral bypass surgery   . Aortic stenosis 04/11/2018  . Presence of permanent cardiac pacemaker   . PAF (paroxysmal atrial fibrillation) ([place])   . Carotid artery disease ([place])   . CAD (coronary artery disease)   . BPH (benign prostatic hyperplasia)   . GAD (generalized anxiety disorder)   . Acute on chronic congestive heart failure ([place]) 04/02/2018  . Severe aortic stenosis 01/04/2014  . HTN (hypertension) 06/24/2010  . Ulcerative colitis ([place]) 06/24/2010    Past Surgical History:  Procedure Laterality Date  . BALLOON AORTIC VALVE VALVULOPLASTY N/A 04/11/2018   Procedure: BALLOON AORTIC VALVE VALVULOPLASTY.  RIGHT EXTERNAL ILIAC ARTERY OVERSEW.;  Surgeon: Sherren Mocha, MD;  Location: [facility];  Service: Open Heart Surgery;  Laterality: N/A;  . CARDIAC CATHETERIZATION  1999  . CAROTID ENDARTERECTOMY Right 2004  . CATARACT EXTRACTION, BILATERAL  2011  . CORONARY ARTERY BYPASS GRAFT  04/1998   3 vessel CAD  .  ENDARTERECTOMY FEMORAL Right 04/11/2018   Procedure: ENDARTERECTOMY FEMORAL;  Surgeon: Sherren Mocha, MD;  Location: [facility];  Service: Open Heart Surgery;  Laterality: Right;  . FEMORAL-FEMORAL BYPASS GRAFT N/A 04/11/2018   Procedure: LEFT TO RIGHT BYPASS GRAFT FEMORAL-FEMORAL ARTERY;  Surgeon: Sherren Mocha, MD;  Location: [facility];  Service: Open Heart Surgery;  Laterality: N/A;  . INCISION AND DRAINAGE OF WOUND Left 05/04/2016   Procedure: SURGICAL PREP FOR GRAFTING LEFT LEG WITH THERA SKIN APPLICATION;  Surgeon:  Irene Limbo, MD;  Location: [facility];  Service: Plastics;  Laterality: Left;  . INTRAOPERATIVE ARTERIOGRAM  06/2000  . INTRAOPERATIVE TRANSTHORACIC ECHOCARDIOGRAM  04/11/2018   Procedure: INTRAOPERATIVE TRANSTHORACIC ECHOCARDIOGRAM;  Surgeon: Sherren Mocha, MD;  Location: [facility];  Service: Open Heart Surgery;;  . PACEMAKER INSERTION  03/25/2010  . RIGHT/LEFT HEART CATH AND CORONARY/GRAFT ANGIOGRAPHY N/A 04/05/2018   Procedure: RIGHT/LEFT HEART CATH AND CORONARY/GRAFT ANGIOGRAPHY;  Surgeon: Burnell Blanks, MD;  Location: [facility];  Service: Cardiovascular;  Laterality: N/A;  . TEE WITHOUT CARDIOVERSION N/A 04/18/2018   Procedure: TRANSESOPHAGEAL ECHOCARDIOGRAM (TEE);  Surgeon: Sherren Mocha, MD;  Location: [facility];  Service: Open Heart Surgery;  Laterality: N/A;  . TOTAL KNEE ARTHROPLASTY  07/16/09  . TRANSCATHETER AORTIC VALVE REPLACEMENT, TRANSAPICAL N/A 04/18/2018   Procedure: TRANSCATHETER AORTIC VALVE REPLACEMENT, TRANSAPICAL. 61m EDWARDS SAPIEN 3 TRANSCATHETER HEART VALVE.;  Surgeon: CSherren Mocha MD;  [place]  Service: Open Heart Surgery;  Laterality: N/A;  . TChristianaMedications    Prior to Admission medications   Medication Sig Start Date End Date Taking? Authorizing Provider  acetaminophen (TYLENOL) 500 MG tablet Take 1,000 mg by mouth at bedtime.    [provider]  amLODipine (NORVASC) 5 MG tablet Take 5 mg by mouth daily.    [provider]  amoxicillin (AMOXIL) 500 MG capsule Take 500 mg by mouth 4 (four) times daily as needed. Patient reports taking 4 capsules before any dental procedure.    [provider]  aspirin 81 MG chewable tablet Chew 1 tablet (81 mg total) by mouth daily. 04/22/18   Barrett, Erin R, PA-C  atorvastatin (LIPITOR) 20 MG tablet Take 20 mg by mouth every other day.     [provider]  balsalazide (COLAZAL) 750 MG capsule Take 2,250 mg by mouth 3  (three) times daily.    [provider]  ELIQUIS 5 MG TABS tablet TAKE 1 TABLET TWICE A DAY 07/03/18   SJerline Pain MD  escitalopram (LEXAPRO) 10 MG tablet Take 10 mg by mouth daily.      [provider]  finasteride (PROSCAR) 5 MG tablet Take 5 mg by mouth See admin instructions. Take one tablet (5 mg) by mouth every day except Wednesdays 01/13/15   [provider]  furosemide (LASIX) 40 MG tablet Take 1 tablet (40 mg total) by mouth daily. 04/22/18   Barrett, Erin R, PA-C  latanoprost (XALATAN) 0.005 % ophthalmic solution Place 1 drop into both eyes at bedtime.     [provider]  losartan (COZAAR) 100 MG tablet Take 1 tablet (100 mg total) by mouth daily. 05/17/18   TEileen Stanford PA-C  mesalamine (CANASA) 1000 MG suppository Place 1,000 mg rectally at bedtime as needed (ulcerative colitis).    [provider]  metoprolol succinate (TOPROL-XL) 50 MG 24 hr tablet Take 50 mg by mouth daily. Take with or immediately following a  meal.    [provider]  Multiple Vitamin (MULTIVITAMIN WITH MINERALS) TABS tablet Take 1 tablet by mouth daily.    [provider]    Family History Family History  Problem Relation Age of Onset  . Congestive Heart Failure Mother   . Leukemia Father   . Leukemia Unknown   . Heart failure Unknown   . Cancer Unknown     Social History Social History   Tobacco Use  . Smoking status: Former Smoker    Years: 10.00    Types: Cigarettes    Last attempt to quit: 08/30/1952    Years since quitting: 66.0  . Smokeless tobacco: Former Network engineer Use Topics  . Alcohol use: No  . Drug use: No     Allergies   No known allergies   Review of Systems Review of Systems  Constitutional: Negative for chills and fever.  HENT: Negative for ear pain and sore throat.   Eyes: Negative for pain and visual disturbance.  Respiratory: Negative for cough and shortness of breath.   Cardiovascular:  Negative for chest pain and palpitations.  Gastrointestinal: Negative for abdominal pain and vomiting.  Genitourinary: Negative for dysuria and hematuria.  Musculoskeletal: Negative for arthralgias and back pain.  Skin: Positive for wound ( left face and left arm). Negative for color change and rash.  Neurological: Positive for headaches. Negative for seizures and syncope.  All other systems reviewed and are negative.    Physical Exam Updated Vital Signs BP (!) 185/76   Pulse 74   Temp 98 F (36.7 C) (Oral)   Resp 20   Ht 5' 11"  (1.803 m)   Wt 79.4 kg   SpO2 97%   BMI 24.41 kg/m   Physical Exam Vitals signs and nursing note reviewed.  Constitutional:      Appearance: He is well-developed.  HENT:     Head: Normocephalic and atraumatic.  Eyes:     Conjunctiva/sclera: Conjunctivae normal.  Neck:     Musculoskeletal: Neck supple.  Cardiovascular:     Rate and Rhythm: Normal rate and regular rhythm.     Pulses:          Radial pulses are 2+ on the right side and 2+ on the left side.       Dorsalis pedis pulses are 2+ on the right side and 2+ on the left side.     Heart sounds: No murmur.  Pulmonary:     Effort: Pulmonary effort is normal. No respiratory distress.     Breath sounds: Normal breath sounds.  Abdominal:     Palpations: Abdomen is soft.     Tenderness: There is no abdominal tenderness.  Skin:    General: Skin is warm and dry.     Findings: Wound present.     Comments: Patient is noted to have a hematoma over his left eyelid as well as over the posterior aspect of the left forearm.  Patient is neurovascular intact in the left upper extremity as well as in the right upper extremity and bilateral lower extremities.  Neurological:     Mental Status: He is alert.      ED Treatments / Results  Labs (all labs ordered are listed, but only abnormal results are displayed) Labs Reviewed  COMPREHENSIVE METABOLIC PANEL - Abnormal; Notable for the following  components:      Result Value   Glucose, Bld 122 (*)    BUN 25 (*)    Alkaline Phosphatase 37 (*)  All other components within normal limits  CBC - Abnormal; Notable for the following components:   RBC 3.80 (*)    Hemoglobin 11.4 (*)    HCT 35.2 (*)    RDW 16.3 (*)    Platelets 143 (*)    All other components within normal limits  URINALYSIS, ROUTINE W REFLEX MICROSCOPIC - Abnormal; Notable for the following components:   Color, Urine STRAW (*)    Protein, ur 30 (*)    All other components within normal limits  I-STAT CHEM 8, ED - Abnormal; Notable for the following components:   BUN 27 (*)    Glucose, Bld 120 (*)    Hemoglobin 12.2 (*)    HCT 36.0 (*)    All other components within normal limits  PROTIME-INR  ETHANOL  CDS SEROLOGY  I-STAT CG4 LACTIC ACID, ED  SAMPLE TO BLOOD BANK    EKG EKG Interpretation  Date/Time:  Thursday September 07 2018 17:46:30 EST Ventricular Rate:  72 PR Interval:    QRS Duration: 191 QT Interval:  492 QTC Calculation: 539 R Axis:   -98 Text Interpretation:  Sinus rhythm Probable left atrial enlargement Right bundle branch block Paced rhythm Baseline wander in lead(s) I II aVR aVF when compared to prior, similar paced rhythm with new  t wave inversions in leads V1 and V2.  No STEMI Confirmed by Antony Blackbird [phone]) on 09/07/2018 6:13:01 PM   Radiology Dg Forearm Left  Result Date: 09/07/2018 CLINICAL DATA:  Pain after fall. Forearm hematoma. EXAM: LEFT FOREARM - 2 VIEW COMPARISON:  None. FINDINGS: Soft tissue swelling of the dorsum of the left mid forearm is identified. No fracture or joint dislocation is identified. Osteoarthritis at the base of the thumb metacarpal and triscaphe joint of the wrist is noted. The elbow joint is maintained without joint effusion. IMPRESSION: 1. Soft tissue swelling of the dorsum of the forearm without underlying fracture or joint dislocation. 2. 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  Electronically Signed   By: Ashley Royalty M.D.   On: 09/07/2018 18:28   Ct Head Wo Contrast  Result Date: 09/07/2018 CLINICAL DATA:  Patient fell while taking out the trash hitting left side of head and arm. Patient is on Eliquis. EXAM: CT HEAD WITHOUT CONTRAST CT MAXILLOFACIAL WITHOUT CONTRAST CT CERVICAL SPINE WITHOUT CONTRAST TECHNIQUE: Multidetector CT imaging of the head, cervical spine, and maxillofacial structures were performed using the standard protocol without intravenous contrast. Multiplanar CT image reconstructions of the cervical spine and maxillofacial structures were also generated. COMPARISON:  03/19/2017 head CT, 07/31/2018 CTA of the neck FINDINGS: CT HEAD FINDINGS Brain: Superficial and central atrophy with mild-to-moderate small vessel ischemic disease. Remote subcortical infarct in the right posterior parietal with encephalomalacia. Midline fourth ventricle and basal cisterns. No effacement is identified. No intra-axial mass nor extra-axial fluid collections. Vascular: Atherosclerosis of the left vertebral artery. Moderate atherosclerosis of the carotid siphons bilaterally. No hyperdense vessel sign. Skull: Intact Other: Left periorbital soft tissue swelling consistent with a contusion with concomitant 3.2 x 1.6 x 2.5 cm soft tissue hematoma. CT MAXILLOFACIAL FINDINGS Osseous: No fracture or mandibular dislocation. No destructive process. Orbits: Bilateral cataract extractions. No traumatic or inflammatory finding. Sinuses: Mild ethmoid, frontal left maxillary sinus mucosal thickening. No air-fluid levels. Soft tissues: Periorbital hematoma as above. CT CERVICAL SPINE FINDINGS Alignment: 3.6 mm of anterolisthesis likely on the basis of degenerative disc and facet arthropathy is noted of C3 on C4. Intact craniocervical relationship and  atlantodental interval. Skull base and vertebrae: Intact skull base. No acute cervical spine fracture. Soft tissues and spinal canal: No significant  prevertebral soft tissue swelling allowing for flexion of the cervical spine. No visible canal hematoma. Disc levels: Marked disc flattening C4-5 and C5-6 with associated degenerative endplate spurring. Ankylosed appearance of the upper cervical facets from C2 through C3. No jumped or perched appearing facets. Upper chest: Calcified pleural plaque noted bilaterally. No acute pulmonary consolidations. Other: Atherosclerosis of the great vessels and extracranial carotid arteries. IMPRESSION: 1. Left periorbital soft tissue contusion with concomitant 3.2 x 1.6 x 2.5 cm soft tissue hematoma. 2. Atrophy with chronic small vessel ischemia. 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 3. No acute maxillofacial fracture. 4. Cervical spondylosis without acute cervical spine fracture. Degenerative grade 1 anterolisthesis of C3 on C4 by 3.6 mm. Multilevel degenerative facet arthropathy Electronically Signed   By: Ashley Royalty M.D.   On: 09/07/2018 19:05   Ct Cervical Spine Wo Contrast  Result Date: 09/07/2018 CLINICAL DATA:  Patient fell while taking out the trash hitting left side of head and arm. Patient is on Eliquis. EXAM: CT HEAD WITHOUT CONTRAST CT MAXILLOFACIAL WITHOUT CONTRAST CT CERVICAL SPINE WITHOUT CONTRAST TECHNIQUE: Multidetector CT imaging of the head, cervical spine, and maxillofacial structures were performed using the standard protocol without intravenous contrast. Multiplanar CT image reconstructions of the cervical spine and maxillofacial structures were also generated. COMPARISON:  03/19/2017 head CT, 07/31/2018 CTA of the neck FINDINGS: CT HEAD FINDINGS Brain: Superficial and central atrophy with mild-to-moderate small vessel ischemic disease. Remote subcortical infarct in the right posterior parietal with encephalomalacia. Midline fourth ventricle and basal cisterns. No effacement is identified. No intra-axial mass nor extra-axial fluid collections. Vascular:  Atherosclerosis of the left vertebral artery. Moderate atherosclerosis of the carotid siphons bilaterally. No hyperdense vessel sign. Skull: Intact Other: Left periorbital soft tissue swelling consistent with a contusion with concomitant 3.2 x 1.6 x 2.5 cm soft tissue hematoma. CT MAXILLOFACIAL FINDINGS Osseous: No fracture or mandibular dislocation. No destructive process. Orbits: Bilateral cataract extractions. No traumatic or inflammatory finding. Sinuses: Mild ethmoid, frontal left maxillary sinus mucosal thickening. No air-fluid levels. Soft tissues: Periorbital hematoma as above. CT CERVICAL SPINE FINDINGS Alignment: 3.6 mm of anterolisthesis likely on the basis of degenerative disc and facet arthropathy is noted of C3 on C4. Intact craniocervical relationship and atlantodental interval. Skull base and vertebrae: Intact skull base. No acute cervical spine fracture. Soft tissues and spinal canal: No significant prevertebral soft tissue swelling allowing for flexion of the cervical spine. No visible canal hematoma. Disc levels: Marked disc flattening C4-5 and C5-6 with associated degenerative endplate spurring. Ankylosed appearance of the upper cervical facets from C2 through C3. No jumped or perched appearing facets. Upper chest: Calcified pleural plaque noted bilaterally. No acute pulmonary consolidations. Other: Atherosclerosis of the great vessels and extracranial carotid arteries. IMPRESSION: 1. Left periorbital soft tissue contusion with concomitant 3.2 x 1.6 x 2.5 cm soft tissue hematoma. 2. Atrophy with chronic small vessel ischemia. 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 3. No acute maxillofacial fracture. 4. Cervical spondylosis without acute cervical spine fracture. Degenerative grade 1 anterolisthesis of C3 on C4 by 3.6 mm. Multilevel degenerative facet arthropathy Electronically Signed   By: Ashley Royalty M.D.   On: 09/07/2018 19:05   Dg Pelvis Portable  Result  Date: 09/07/2018 CLINICAL DATA:  Patient fell today and sustained left forearm and facial hematomas. Question pelvic or hip fracture resulting in the fall.  EXAM: PORTABLE PELVIS 1-2 VIEWS COMPARISON:  None. FINDINGS: Degenerative disc disease and facet arthropathy is noted of the lower included lumbar spine from most of L3 through S1. There is dextroconvex curvature demonstrated. No pelvic diastasis or fracture. Slight degenerative joint space narrowing is seen of both hips. No acute fracture of the proximal femora. Vascular calcifications are noted along the common femoral arteries with surgical clips projecting over the hips. IMPRESSION: No acute osseous abnormality of the bony pelvis and hips. Mild degenerative joint space narrowing of both hips without fracture. Electronically Signed   By: Ashley Royalty M.D.   On: 09/07/2018 18:27   Dg Chest Port 1 View  Result Date: 09/07/2018 CLINICAL DATA:  Patient fell today with left forearm and facial hematomas. EXAM: PORTABLE CHEST 1 VIEW COMPARISON:  05/03/2018 FINDINGS: Heart is enlarged but stable in appearance. The patient is status post TAVR and CABG. Right atrial and right ventricular leads with left-sided pacemaker apparatus is again noted without complicating features. Moderate aortic atherosclerosis is noted at the arch. There is minimal atelectasis at the left lung base. No pulmonary contusion or effusion is identified. No pneumothorax is noted. No acute displaced rib fracture. IMPRESSION: 1. Stable cardiomegaly with aortic atherosclerosis. No acute pulmonary disease. 2. No acute osseous abnormality. 3. Aortic atherosclerosis. Electronically Signed   By: Ashley Royalty M.D.   On: 09/07/2018 18:24   Ct Maxillofacial Wo Contrast  Result Date: 09/07/2018 CLINICAL DATA:  Patient fell while taking out the trash hitting left side of head and arm. Patient is on Eliquis. EXAM: CT HEAD WITHOUT CONTRAST CT MAXILLOFACIAL WITHOUT CONTRAST CT CERVICAL SPINE WITHOUT CONTRAST  TECHNIQUE: Multidetector CT imaging of the head, cervical spine, and maxillofacial structures were performed using the standard protocol without intravenous contrast. Multiplanar CT image reconstructions of the cervical spine and maxillofacial structures were also generated. COMPARISON:  03/19/2017 head CT, 07/31/2018 CTA of the neck FINDINGS: CT HEAD FINDINGS Brain: Superficial and central atrophy with mild-to-moderate small vessel ischemic disease. Remote subcortical infarct in the right posterior parietal with encephalomalacia. Midline fourth ventricle and basal cisterns. No effacement is identified. No intra-axial mass nor extra-axial fluid collections. Vascular: Atherosclerosis of the left vertebral artery. Moderate atherosclerosis of the carotid siphons bilaterally. No hyperdense vessel sign. Skull: Intact Other: Left periorbital soft tissue swelling consistent with a contusion with concomitant 3.2 x 1.6 x 2.5 cm soft tissue hematoma. CT MAXILLOFACIAL FINDINGS Osseous: No fracture or mandibular dislocation. No destructive process. Orbits: Bilateral cataract extractions. No traumatic or inflammatory finding. Sinuses: Mild ethmoid, frontal left maxillary sinus mucosal thickening. No air-fluid levels. Soft tissues: Periorbital hematoma as above. CT CERVICAL SPINE FINDINGS Alignment: 3.6 mm of anterolisthesis likely on the basis of degenerative disc and facet arthropathy is noted of C3 on C4. Intact craniocervical relationship and atlantodental interval. Skull base and vertebrae: Intact skull base. No acute cervical spine fracture. Soft tissues and spinal canal: No significant prevertebral soft tissue swelling allowing for flexion of the cervical spine. No visible canal hematoma. Disc levels: Marked disc flattening C4-5 and C5-6 with associated degenerative endplate spurring. Ankylosed appearance of the upper cervical facets from C2 through C3. No jumped or perched appearing facets. Upper chest: Calcified pleural  plaque noted bilaterally. No acute pulmonary consolidations. Other: Atherosclerosis of the great vessels and extracranial carotid arteries. IMPRESSION: 1. Left periorbital soft tissue contusion with concomitant 3.2 x 1.6 x 2.5 cm soft tissue hematoma. 2. Atrophy with chronic small vessel ischemia. 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 3.  No acute maxillofacial fracture. 4. Cervical spondylosis without acute cervical spine fracture. Degenerative grade 1 anterolisthesis of C3 on C4 by 3.6 mm. Multilevel degenerative facet arthropathy Electronically Signed   By: Ashley Royalty M.D.   On: 09/07/2018 19:05    Procedures Procedures (including critical care time)  Medications Ordered in ED Medications  oxyCODONE-acetaminophen (PERCOCET/ROXICET) 5-325 MG per tablet 1 tablet (has no administration in time range)  Tdap (BOOSTRIX) injection 0.5 mL (has no administration in time range)     Initial Impression / Assessment and Plan / ED Course  I have reviewed the triage vital signs and the nursing notes.  Pertinent labs & imaging results that were available during my care of the patient were reviewed by me and considered in my medical decision making (see chart for details).     Patient is a 85 year old male who presents with above-stated history exam after a mechanical ground-level fall.  On presentation patient is noted to be afebrile stable vital signs.  Exam as above remarkable for hematoma over the left side of the face as well as left forearm.  There are no focal neurological deficits, lungs are clear to auscultation bilaterally, abdomen is soft nontender.  I have a low suspicion for ACS or syncope given patient denies any recent chest pain, ECG shows a paced rhythm without signs of acute ischemia and patient denies any other concurrent symptoms around his fall.  CT head, maxillofacial, and C-spine were obtained that were markable for "Left periorbital soft tissue  contusion with concomitant 3.2 x 1.6 x 2.5 cm soft tissue hematoma. 2. Atrophy with chronic small vessel ischemia. 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 3. No acute maxillofacial fracture. 4. Cervical spondylosis without acute cervical spine fracture. Degenerative grade 1 anterolisthesis of C3 on C4 by 3.6 mm. Multilevel degenerative facet arthropathy"  Chest x-ray, pelvic x-ray, and left forearm x-rays were obtained that did not show any acute traumatic injuries.  CMP and CBC are WNL.  Impression is contusions with underlying hematomas to the left face and left forearm.  No other significant traumatic injuries and history exam.  History exam is not consistent with syncopal episode or seizure.  Patient's patient's pacemaker was interrogated and found to have no recent arrhythmias in the last 24 hours.  Tetanus was updated and patient was given p.o. analgesia in the emergency department.  Patient discharged in stable condition.  Strict return precautions advised and discussed.  Instructed follow-up with PCP tomorrow.  Patient family voiced understanding agreement with this plan prior to discharge.   Final Clinical Impressions(s) / ED Diagnoses   Final diagnoses:  Injury of head, initial encounter  Hematoma    ED Discharge Orders    None       Hulan Saas, MD 09/07/18 [place] Reusing    Hulan Saas, MD 09/07/18 2003    Tegeler, Gwenyth Allegra, MD 09/08/18 1040

## 2018-09-07 NOTE — ED Notes (Signed)
Pt alert and oriented in NAD. Pt verbalized understanding of discharge instructions. 

## 2018-09-07 NOTE — ED Triage Notes (Signed)
[facility] reports pt fell taking on trash, hitting left side of headf and arm. Pt on eliquis. No loc or neuro deficits. Hx Ao valve replacement, v pacer, afib

## 2018-09-08 DIAGNOSIS — S0512XA Contusion of eyeball and orbital tissues, left eye, initial encounter: Secondary | ICD-10-CM | POA: Diagnosis not present

## 2018-09-08 DIAGNOSIS — H1132 Conjunctival hemorrhage, left eye: Secondary | ICD-10-CM | POA: Diagnosis not present

## 2018-09-08 DIAGNOSIS — S40022A Contusion of left upper arm, initial encounter: Secondary | ICD-10-CM | POA: Diagnosis not present

## 2018-09-08 DIAGNOSIS — W19XXXA Unspecified fall, initial encounter: Secondary | ICD-10-CM | POA: Diagnosis not present

## 2018-10-31 DIAGNOSIS — D692 Other nonthrombocytopenic purpura: Secondary | ICD-10-CM | POA: Diagnosis not present

## 2018-10-31 DIAGNOSIS — I700 Atherosclerosis of aorta: Secondary | ICD-10-CM | POA: Diagnosis not present

## 2018-10-31 DIAGNOSIS — I10 Essential (primary) hypertension: Secondary | ICD-10-CM | POA: Diagnosis not present

## 2018-10-31 DIAGNOSIS — I480 Paroxysmal atrial fibrillation: Secondary | ICD-10-CM | POA: Diagnosis not present

## 2018-10-31 DIAGNOSIS — Z950 Presence of cardiac pacemaker: Secondary | ICD-10-CM | POA: Diagnosis not present

## 2018-10-31 DIAGNOSIS — S0083XA Contusion of other part of head, initial encounter: Secondary | ICD-10-CM | POA: Diagnosis not present

## 2018-11-28 ENCOUNTER — Other Ambulatory Visit: Payer: Self-pay

## 2018-11-28 ENCOUNTER — Ambulatory Visit (INDEPENDENT_AMBULATORY_CARE_PROVIDER_SITE_OTHER): Payer: Medicare Other | Admitting: *Deleted

## 2018-11-28 DIAGNOSIS — I442 Atrioventricular block, complete: Secondary | ICD-10-CM

## 2018-11-28 LAB — CUP PACEART REMOTE DEVICE CHECK
Battery Voltage: 2.77 V
Brady Statistic AP VP Percent: 66 %
Brady Statistic AP VS Percent: 1 %
Brady Statistic AS VS Percent: 4 %
Date Time Interrogation Session: 20200331152540
Implantable Lead Implant Date: 20110725
Implantable Lead Location: 753859
Implantable Lead Location: 753860
Implantable Lead Model: 5076
Implantable Lead Model: 5076
Implantable Pulse Generator Implant Date: 20110725
Lead Channel Impedance Value: 502 Ohm
Lead Channel Impedance Value: 644 Ohm
Lead Channel Pacing Threshold Amplitude: 0.5 V
Lead Channel Pacing Threshold Amplitude: 0.75 V
Lead Channel Pacing Threshold Pulse Width: 0.4 ms
Lead Channel Pacing Threshold Pulse Width: 0.4 ms
Lead Channel Setting Pacing Amplitude: 2 V
Lead Channel Setting Pacing Pulse Width: 0.4 ms
Lead Channel Setting Sensing Sensitivity: 2.8 mV
MDC IDC LEAD IMPLANT DT: 20110725
MDC IDC MSMT BATTERY IMPEDANCE: 1185 Ohm
MDC IDC MSMT BATTERY REMAINING LONGEVITY: 51 mo
MDC IDC SET LEADCHNL RV PACING AMPLITUDE: 2.5 V
MDC IDC STAT BRADY AS VP PERCENT: 29 %

## 2018-12-05 ENCOUNTER — Encounter: Payer: Self-pay | Admitting: Cardiology

## 2018-12-05 NOTE — Progress Notes (Signed)
Remote pacemaker transmission.   

## 2018-12-24 ENCOUNTER — Other Ambulatory Visit: Payer: Self-pay | Admitting: Cardiology

## 2018-12-25 NOTE — Telephone Encounter (Signed)
85 yo, 79.4kg, Scr 1.06 on 09/07/18 Last OV 08/18/18

## 2019-02-15 ENCOUNTER — Ambulatory Visit: Payer: Medicare Other | Admitting: Cardiology

## 2019-02-27 ENCOUNTER — Ambulatory Visit (INDEPENDENT_AMBULATORY_CARE_PROVIDER_SITE_OTHER): Payer: Medicare Other | Admitting: *Deleted

## 2019-02-27 DIAGNOSIS — I442 Atrioventricular block, complete: Secondary | ICD-10-CM | POA: Diagnosis not present

## 2019-02-27 LAB — CUP PACEART REMOTE DEVICE CHECK
Battery Impedance: 1264 Ohm
Battery Remaining Longevity: 48 mo
Battery Voltage: 2.76 V
Brady Statistic AP VP Percent: 71 %
Brady Statistic AP VS Percent: 1 %
Brady Statistic AS VP Percent: 24 %
Brady Statistic AS VS Percent: 3 %
Date Time Interrogation Session: 20200630152800
Implantable Lead Implant Date: 20110725
Implantable Lead Implant Date: 20110725
Implantable Lead Location: 753859
Implantable Lead Location: 753860
Implantable Lead Model: 5076
Implantable Lead Model: 5076
Implantable Pulse Generator Implant Date: 20110725
Lead Channel Impedance Value: 516 Ohm
Lead Channel Impedance Value: 584 Ohm
Lead Channel Pacing Threshold Amplitude: 0.5 V
Lead Channel Pacing Threshold Amplitude: 0.625 V
Lead Channel Pacing Threshold Pulse Width: 0.4 ms
Lead Channel Pacing Threshold Pulse Width: 0.4 ms
Lead Channel Setting Pacing Amplitude: 2 V
Lead Channel Setting Pacing Amplitude: 2.5 V
Lead Channel Setting Pacing Pulse Width: 0.4 ms
Lead Channel Setting Sensing Sensitivity: 2.8 mV

## 2019-03-10 ENCOUNTER — Encounter: Payer: Self-pay | Admitting: Cardiology

## 2019-03-10 NOTE — Progress Notes (Signed)
Remote pacemaker transmission.   

## 2019-03-21 ENCOUNTER — Other Ambulatory Visit: Payer: Self-pay | Admitting: Physician Assistant

## 2019-03-21 DIAGNOSIS — Z952 Presence of prosthetic heart valve: Secondary | ICD-10-CM

## 2019-03-22 ENCOUNTER — Other Ambulatory Visit: Payer: Self-pay

## 2019-03-22 ENCOUNTER — Encounter: Payer: Self-pay | Admitting: Podiatry

## 2019-03-22 ENCOUNTER — Ambulatory Visit (INDEPENDENT_AMBULATORY_CARE_PROVIDER_SITE_OTHER): Payer: Medicare Other | Admitting: Podiatry

## 2019-03-22 VITALS — Temp 98.0°F

## 2019-03-22 DIAGNOSIS — M79674 Pain in right toe(s): Secondary | ICD-10-CM

## 2019-03-22 DIAGNOSIS — Z7901 Long term (current) use of anticoagulants: Secondary | ICD-10-CM

## 2019-03-22 DIAGNOSIS — B351 Tinea unguium: Secondary | ICD-10-CM | POA: Diagnosis not present

## 2019-03-22 DIAGNOSIS — L84 Corns and callosities: Secondary | ICD-10-CM

## 2019-03-22 DIAGNOSIS — M79675 Pain in left toe(s): Secondary | ICD-10-CM

## 2019-03-28 NOTE — Progress Notes (Signed)
Subjective:   Patient ID: Dalton Townsend, male   DOB: 85 y.o.   MRN: [id number]   HPI 85 year old male presents the office with concerns of thick, painful, elongated toenails that he cannot trim himself as well as for callus on the right second toe.  Denies any redness or drainage from the callus or toenail sites.  No open lesions.  He is on Eliquis.   Review of Systems  All other systems reviewed and are negative.  Past Medical History:  Diagnosis Date  . BPH (benign prostatic hyperplasia)   . CAD (coronary artery disease)    a. s/p CABG x6V in 1999, cath 03/2018 showed patent 6/6 bypass grafts  . Carotid artery disease ([place])    a. s/p R CEA, 80-99% restenosis of RICA, to be address after TAVR  . Dyslipidemia   . Essential hypertension   . GAD (generalized anxiety disorder)   . Intestinal infection due to Clostridium difficile 03/2010  . PAF (paroxysmal atrial fibrillation) (HCC)    a. on Eliquis  . Presence of permanent cardiac pacemaker   . PVD (peripheral vascular disease) ([place])   . S/P femoral-femoral bypass surgery    a. emergent fem fem bypass due to iliac complication during first attempt TAVR placement on 04/11/18 by Dr. Scot Dock  . S/P TAVR (transcatheter aortic valve replacement)    a. 04/18/18: s/p succesful TAVR with a [place] 3 THV  . Severe aortic stenosis   . Skin cancer   . Ulcerative colitis     Past Surgical History:  Procedure Laterality Date  . BALLOON AORTIC VALVE VALVULOPLASTY N/A 04/11/2018   Procedure: BALLOON AORTIC VALVE VALVULOPLASTY.  RIGHT EXTERNAL ILIAC ARTERY OVERSEW.;  Surgeon: Sherren Mocha, MD;  Location: [facility];  Service: Open Heart Surgery;  Laterality: N/A;  . CARDIAC CATHETERIZATION  1999  . CAROTID ENDARTERECTOMY Right 2004  . CATARACT EXTRACTION, BILATERAL  2011  . CORONARY ARTERY BYPASS GRAFT  04/1998   3 vessel CAD  . ENDARTERECTOMY FEMORAL Right 04/11/2018   Procedure: ENDARTERECTOMY FEMORAL;  Surgeon: Sherren Mocha,  MD;  Location: [facility];  Service: Open Heart Surgery;  Laterality: Right;  . FEMORAL-FEMORAL BYPASS GRAFT N/A 04/11/2018   Procedure: LEFT TO RIGHT BYPASS GRAFT FEMORAL-FEMORAL ARTERY;  Surgeon: Sherren Mocha, MD;  Location: [facility];  Service: Open Heart Surgery;  Laterality: N/A;  . INCISION AND DRAINAGE OF WOUND Left 05/04/2016   Procedure: SURGICAL PREP FOR GRAFTING LEFT LEG WITH THERA SKIN APPLICATION;  Surgeon: Irene Limbo, MD;  Location: [facility];  Service: Plastics;  Laterality: Left;  . INTRAOPERATIVE ARTERIOGRAM  06/2000  . INTRAOPERATIVE TRANSTHORACIC ECHOCARDIOGRAM  04/11/2018   Procedure: INTRAOPERATIVE TRANSTHORACIC ECHOCARDIOGRAM;  Surgeon: Sherren Mocha, MD;  Location: [facility] OR;  Service: Open Heart Surgery;;  . PACEMAKER INSERTION  03/25/2010  . RIGHT/LEFT HEART CATH AND CORONARY/GRAFT ANGIOGRAPHY N/A 04/05/2018   Procedure: RIGHT/LEFT HEART CATH AND CORONARY/GRAFT ANGIOGRAPHY;  Surgeon: Burnell Blanks, MD;  Location: [facility];  Service: Cardiovascular;  Laterality: N/A;  . TEE WITHOUT CARDIOVERSION N/A 04/18/2018   Procedure: TRANSESOPHAGEAL ECHOCARDIOGRAM (TEE);  Surgeon: Sherren Mocha, MD;  Location: [facility];  Service: Open Heart Surgery;  Laterality: N/A;  . TOTAL KNEE ARTHROPLASTY  07/16/09  . TRANSCATHETER AORTIC VALVE REPLACEMENT, TRANSAPICAL N/A 04/18/2018   Procedure: TRANSCATHETER AORTIC VALVE REPLACEMENT, TRANSAPICAL. 107m EDWARDS SAPIEN 3 TRANSCATHETER HEART VALVE.;  Surgeon: CSherren Mocha MD;  Location: [facility]  Service: Open Heart Surgery;  Laterality: N/A;  . TRANSURETHRAL RESECTION OF  PROSTATE  1998     Current Outpatient Medications:  .  acetaminophen (TYLENOL) 500 MG tablet, Take 1,000 mg by mouth at bedtime., Disp: , Rfl:  .  amLODipine (NORVASC) 5 MG tablet, Take 5 mg by mouth daily., Disp: , Rfl:  .  amoxicillin (AMOXIL) 500 MG capsule, Take 500 mg by mouth 4 (four) times daily as needed. Patient reports taking 4 capsules before any dental  procedure., Disp: , Rfl:  .  aspirin 81 MG chewable tablet, Chew 1 tablet (81 mg total) by mouth daily., Disp: , Rfl:  .  atorvastatin (LIPITOR) 20 MG tablet, Take 20 mg by mouth every other day. , Disp: , Rfl:  .  balsalazide (COLAZAL) 750 MG capsule, Take 2,250 mg by mouth 3 (three) times daily., Disp: , Rfl:  .  ELIQUIS 5 MG TABS tablet, TAKE 1 TABLET TWICE A DAY, Disp: 180 tablet, Rfl: 1 .  escitalopram (LEXAPRO) 10 MG tablet, Take 10 mg by mouth daily.  , Disp: , Rfl:  .  finasteride (PROSCAR) 5 MG tablet, Take 5 mg by mouth See admin instructions. Take one tablet (5 mg) by mouth every day except Wednesdays, Disp: , Rfl:  .  furosemide (LASIX) 40 MG tablet, Take 1 tablet (40 mg total) by mouth daily., Disp: 30 tablet, Rfl: 1 .  latanoprost (XALATAN) 0.005 % ophthalmic solution, Place 1 drop into both eyes at bedtime. , Disp: , Rfl:  .  losartan (COZAAR) 100 MG tablet, Take 1 tablet (100 mg total) by mouth daily., Disp: , Rfl: 3 .  mesalamine (CANASA) 1000 MG suppository, Place 1,000 mg rectally at bedtime as needed (ulcerative colitis)., Disp: , Rfl:  .  metoprolol succinate (TOPROL-XL) 50 MG 24 hr tablet, Take 50 mg by mouth daily. Take with or immediately following a meal., Disp: , Rfl:  .  Multiple Vitamin (MULTIVITAMIN WITH MINERALS) TABS tablet, Take 1 tablet by mouth daily., Disp: , Rfl:   Allergies  Allergen Reactions  . No Known Allergies           Objective:  Physical Exam  General: AAO x3, NAD  Dermatological: Nails are hypertrophic, dystrophic, brittle, discolored, elongated 10. No surrounding redness or drainage. Tenderness nails 1-5 bilaterally.  Hyperkeratotic tissue present along the right second and left third digit nails.  No ongoing ulceration drainage or signs of infection.  No open lesions or pre-ulcerative lesions are identified today.  Vascular: Dorsalis Pedis artery and Posterior Tibial artery pedal pulses are 2/4 bilateral with immedate capillary fill time.  There is no pain with calf compression, swelling, warmth, erythema.   Neruologic: Grossly intact via light touch bilateral.   Musculoskeletal: No gross boney pedal deformities bilateral. No pain, crepitus, or limitation noted with foot and ankle range of motion bilateral. Muscular strength 5/5 in all groups tested bilateral.  Gait: Unassisted, Nonantalgic.       Assessment:   Symptomatic onychomycosis, hyperkeratotic lesions    Plan:  -Treatment options discussed including all alternatives, risks, and complications -Etiology of symptoms were discussed -Nails debrided x10 without any complications or bleeding -Calluses debrided x2 without any complications or bleeding. -Discussed daily foot inspection  Return in about 3 months (around 06/22/2019).  Trula Slade DPM

## 2019-04-06 DIAGNOSIS — K51219 Ulcerative (chronic) proctitis with unspecified complications: Secondary | ICD-10-CM | POA: Diagnosis not present

## 2019-05-14 ENCOUNTER — Encounter

## 2019-05-14 ENCOUNTER — Ambulatory Visit (INDEPENDENT_AMBULATORY_CARE_PROVIDER_SITE_OTHER): Payer: Medicare Other | Admitting: Cardiology

## 2019-05-14 ENCOUNTER — Ambulatory Visit (HOSPITAL_COMMUNITY): Payer: Medicare Other | Attending: Cardiology

## 2019-05-14 ENCOUNTER — Other Ambulatory Visit: Payer: Self-pay

## 2019-05-14 ENCOUNTER — Encounter: Payer: Self-pay | Admitting: Cardiology

## 2019-05-14 VITALS — BP 164/60 | HR 80 | Ht 71.0 in | Wt 183.0 lb

## 2019-05-14 DIAGNOSIS — I442 Atrioventricular block, complete: Secondary | ICD-10-CM

## 2019-05-14 DIAGNOSIS — Z952 Presence of prosthetic heart valve: Secondary | ICD-10-CM | POA: Diagnosis not present

## 2019-05-14 DIAGNOSIS — Z95828 Presence of other vascular implants and grafts: Secondary | ICD-10-CM | POA: Diagnosis not present

## 2019-05-14 DIAGNOSIS — I10 Essential (primary) hypertension: Secondary | ICD-10-CM

## 2019-05-14 NOTE — Progress Notes (Unsigned)
Patient would not authorize the administration of Definity.

## 2019-05-14 NOTE — Progress Notes (Signed)
Cardiology Office Note:    Date:  05/14/2019   ID:  EZIO WIECK, DOB 08/22/1923, MRN [id number]  PCP:  Lajean Manes, MD  Cardiologist:  Candee Furbish, MD  Electrophysiologist:  None   Referring MD: Lajean Manes, MD     History of Present Illness:    Dalton Townsend is a 85 y.o. male withTAVR 26 mm Edwards AP and 3 valve on August 2019, transapical approach, pacemaker, paroxysmal atrial fibrillation 10% burden. Had pacemaker placed. His peripheral arterial disease femorofemoral bypass was an emergent bypass, Dr. Scot Dock, following attempt at transfemoral TAVR  post CABG in 1999 with catheterization 03/2018 showing all patent grafts with carotid artery disease 99% restenosis of right internal carotid artery,   Stairs to 1st floor, mildly increased breathing. NYHA 1.  Overall doing very well.  Very pleasant.  No chest pain bleeding, fevers.  Past Medical History:  Diagnosis Date  . BPH (benign prostatic hyperplasia)   . CAD (coronary artery disease)    a. s/p CABG x6V in 1999, cath 03/2018 showed patent 6/6 bypass grafts  . Carotid artery disease ([place])    a. s/p R CEA, 80-99% restenosis of RICA, to be address after TAVR  . Dyslipidemia   . Essential hypertension   . GAD (generalized anxiety disorder)   . Intestinal infection due to Clostridium difficile 03/2010  . PAF (paroxysmal atrial fibrillation) (HCC)    a. on Eliquis  . Presence of permanent cardiac pacemaker   . PVD (peripheral vascular disease) ([place])   . S/P femoral-femoral bypass surgery    a. emergent fem fem bypass due to iliac complication during first attempt TAVR placement on 04/11/18 by Dr. Scot Dock  . S/P TAVR (transcatheter aortic valve replacement)    a. 04/18/18: s/p succesful TAVR with a [place] 3 THV  . Severe aortic stenosis   . Skin cancer   . Ulcerative colitis     Past Surgical History:  Procedure Laterality Date  . BALLOON AORTIC VALVE VALVULOPLASTY N/A 04/11/2018   Procedure:  BALLOON AORTIC VALVE VALVULOPLASTY.  RIGHT EXTERNAL ILIAC ARTERY OVERSEW.;  Surgeon: Sherren Mocha, MD;  Location: [facility];  Service: Open Heart Surgery;  Laterality: N/A;  . CARDIAC CATHETERIZATION  1999  . CAROTID ENDARTERECTOMY Right 2004  . CATARACT EXTRACTION, BILATERAL  2011  . CORONARY ARTERY BYPASS GRAFT  04/1998   3 vessel CAD  . ENDARTERECTOMY FEMORAL Right 04/11/2018   Procedure: ENDARTERECTOMY FEMORAL;  Surgeon: Sherren Mocha, MD;  Location: [facility];  Service: Open Heart Surgery;  Laterality: Right;  . FEMORAL-FEMORAL BYPASS GRAFT N/A 04/11/2018   Procedure: LEFT TO RIGHT BYPASS GRAFT FEMORAL-FEMORAL ARTERY;  Surgeon: Sherren Mocha, MD;  Location: [facility];  Service: Open Heart Surgery;  Laterality: N/A;  . INCISION AND DRAINAGE OF WOUND Left 05/04/2016   Procedure: SURGICAL PREP FOR GRAFTING LEFT LEG WITH THERA SKIN APPLICATION;  Surgeon: Irene Limbo, MD;  Location: [facility];  Service: Plastics;  Laterality: Left;  . INTRAOPERATIVE ARTERIOGRAM  06/2000  . INTRAOPERATIVE TRANSTHORACIC ECHOCARDIOGRAM  04/11/2018   Procedure: INTRAOPERATIVE TRANSTHORACIC ECHOCARDIOGRAM;  Surgeon: Sherren Mocha, MD;  [facility];  Service: Open Heart Surgery;;  . PACEMAKER INSERTION  03/25/2010  . RIGHT/LEFT HEART CATH AND CORONARY/GRAFT ANGIOGRAPHY N/A 04/05/2018   Procedure: RIGHT/LEFT HEART CATH AND CORONARY/GRAFT ANGIOGRAPHY;  Surgeon: Burnell Blanks, MD;  Location: [facility];  Service: Cardiovascular;  Laterality: N/A;  . TEE WITHOUT CARDIOVERSION N/A 04/18/2018   Procedure: TRANSESOPHAGEAL ECHOCARDIOGRAM (TEE);  Surgeon: Burt Knack,  Legrand Como, MD;  Location: [facility];  Service: Open Heart Surgery;  Laterality: N/A;  . TOTAL KNEE ARTHROPLASTY  07/16/09  . TRANSCATHETER AORTIC VALVE REPLACEMENT, TRANSAPICAL N/A 04/18/2018   Procedure: TRANSCATHETER AORTIC VALVE REPLACEMENT, TRANSAPICAL. 52m EDWARDS SAPIEN 3 TRANSCATHETER HEART VALVE.;  Surgeon: CSherren Mocha MD;  Location: [facility]  Service:  Open Heart Surgery;  Laterality: N/A;  . TRANSURETHRAL RESECTION OF PROSTATE  1998    Current Medications: Current Meds  Medication Sig  . acetaminophen (TYLENOL) 500 MG tablet Take 1,000 mg by mouth at bedtime.  .Marland KitchenamLODipine (NORVASC) 5 MG tablet Take 5 mg by mouth daily.  .Marland Kitchenamoxicillin (AMOXIL) 500 MG capsule Take 500 mg by mouth 4 (four) times daily as needed. Patient reports taking 4 capsules before any dental procedure.  .Marland Kitchenaspirin 81 MG chewable tablet Chew 1 tablet (81 mg total) by mouth daily.  .Marland Kitchenatorvastatin (LIPITOR) 20 MG tablet Take 20 mg by mouth every other day.   . balsalazide (COLAZAL) 750 MG capsule Take 2,250 mg by mouth 3 (three) times daily.  .Marland KitchenELIQUIS 5 MG TABS tablet TAKE 1 TABLET TWICE A DAY  . escitalopram (LEXAPRO) 10 MG tablet Take 10 mg by mouth daily.    . finasteride (PROSCAR) 5 MG tablet Take 5 mg by mouth See admin instructions. Take one tablet (5 mg) by mouth every day except Wednesdays  . furosemide (LASIX) 40 MG tablet Take 1 tablet (40 mg total) by mouth daily.  .Marland Kitchenlatanoprost (XALATAN) 0.005 % ophthalmic solution Place 1 drop into both eyes at bedtime.   .Marland Kitchenlosartan (COZAAR) 100 MG tablet Take 1 tablet (100 mg total) by mouth daily.  . mesalamine (CANASA) 1000 MG suppository Place 1,000 mg rectally at bedtime as needed (ulcerative colitis).  . metoprolol succinate (TOPROL-XL) 50 MG 24 hr tablet Take 50 mg by mouth daily. Take with or immediately following a meal.  . Multiple Vitamin (MULTIVITAMIN WITH MINERALS) TABS tablet Take 1 tablet by mouth daily.     Allergies:   No known allergies   Social History   Socioeconomic History  . Marital status: Married    Spouse name: Not on file  . Number of children: 3  . Years of education: Not on file  . Highest education level: Not on file  Occupational History  . Occupation: retired  SScientific laboratory technician . Financial resource strain: Not on file  . Food insecurity    Worry: Not on file    Inability: Not on file   . Transportation needs    Medical: Not on file    Non-medical: Not on file  Tobacco Use  . Smoking status: Former Smoker    Years: 10.00    Types: Cigarettes    Quit date: 08/30/1952    Years since quitting: 66.7  . Smokeless tobacco: Former UNetwork engineerand Sexual Activity  . Alcohol use: No  . Drug use: No  . Sexual activity: Not on file  Lifestyle  . Physical activity    Days per week: Not on file    Minutes per session: Not on file  . Stress: Not on file  Relationships  . Social [place] phone: Not on file    Gets together: Not on file    Attends religious service: Not on file    Active member of club or organization: Not on file    Attends meetings of clubs or organizations: Not on file    Relationship status:  Not on file  Other Topics Concern  . Not on file  Social History Narrative  . Not on file     Family History: The patient's family history includes Cancer in his unknown relative; Congestive Heart Failure in his mother; Heart failure in his unknown relative; Leukemia in his father and unknown relative.  ROS:   Please see the history of present illness.     All other systems reviewed and are negative.  EKGs/Labs/Other Studies Reviewed:    The following studies were reviewed today: Prior studies reviewed echocardiogram today pending  EKG: No new EKG  Recent Labs: 05/17/2018: NT-Pro BNP 3,239 09/07/2018: ALT 11; BUN 27; Creatinine, Ser 1.00; Hemoglobin 12.2; Platelets 143; Potassium 3.6; Sodium 138  Recent Lipid Panel No results found for: CHOL, TRIG, HDL, CHOLHDL, VLDL, LDLCALC, LDLDIRECT  Physical Exam:    VS:  BP (!) 164/60   Pulse 80   Ht 5' 11"  (1.803 m)   Wt 183 lb (83 kg)   SpO2 98%   BMI 25.52 kg/m     Wt Readings from Last 3 Encounters:  05/14/19 183 lb (83 kg)  09/07/18 175 lb (79.4 kg)  08/18/18 180 lb 1.9 oz (81.7 kg)     GEN:  Well nourished, well developed in no acute distress HEENT: Normal NECK: No JVD; No  carotid bruits LYMPHATICS: No lymphadenopathy CARDIAC: RRR, no murmurs, rubs, gallops RESPIRATORY:  Clear to auscultation without rales, wheezing or rhonchi  ABDOMEN: Soft, non-tender, non-distended MUSCULOSKELETAL:  No edema; No deformity  SKIN: Warm and dry NEUROLOGIC:  Alert and oriented x 3 PSYCHIATRIC:  Normal affect   ASSESSMENT:    1. S/P TAVR (transcatheter aortic valve replacement)   2. Essential hypertension   3. Complete heart block ([place])   4. S/P femoral-femoral bypass surgery    PLAN:    In order of problems listed above:  TAVR -NYHA class I.  Doing well.  Paroxysmal atrial fibrillation -On Eliquis, aspirin 81.  No significant bleeding.  Pacemaker -Functioning well.  Dr. Lovena Le.  Peripheral vascular disease - Emergent bypass.  Femorofemoral bypass.  CAD post CABG - 6 out of 6 patent bypass grafts.  Pulmonary nodules - Seen on TAVR work-up.  No need for future CT.  Low risk.  He only smoked for 2 years in his youth.  Carotid artery disease:pre TAVR dopplers showed 80-99% RICA stenosis where he previously had R CEA. Dr. Scot Dock following. ASA 81.  6 months with Cecille Rubin 12 months with me  Medication Adjustments/Labs and Tests Ordered: Current medicines are reviewed at length with the patient today.  Concerns regarding medicines are outlined above.  No orders of the defined types were placed in this encounter.  No orders of the defined types were placed in this encounter.   Patient Instructions  Your physician recommends that you continue on your current medications as directed. Please refer to the Current Medication list given to you today.   Your physician wants you to follow-up in: [place] will receive a reminder letter in the mail two months in advance. If you don't receive a letter, please call our office to schedule the follow-up appointment.     Signed, Candee Furbish, MD  05/14/2019 11:03 AM     [place]

## 2019-05-14 NOTE — Patient Instructions (Addendum)
Your physician recommends that you continue on your current medications as directed. Please refer to the Current Medication list given to you today.   Your physician wants you to follow-up in: [place] will receive a reminder letter in the mail two months in advance. If you don't receive a letter, please call our office to schedule the follow-up appointment.

## 2019-05-23 DIAGNOSIS — Z961 Presence of intraocular lens: Secondary | ICD-10-CM | POA: Diagnosis not present

## 2019-05-23 DIAGNOSIS — H5340 Unspecified visual field defects: Secondary | ICD-10-CM | POA: Diagnosis not present

## 2019-05-23 DIAGNOSIS — H401131 Primary open-angle glaucoma, bilateral, mild stage: Secondary | ICD-10-CM | POA: Diagnosis not present

## 2019-05-23 DIAGNOSIS — H26491 Other secondary cataract, right eye: Secondary | ICD-10-CM | POA: Diagnosis not present

## 2019-05-29 ENCOUNTER — Ambulatory Visit (INDEPENDENT_AMBULATORY_CARE_PROVIDER_SITE_OTHER): Payer: Medicare Other | Admitting: *Deleted

## 2019-05-29 DIAGNOSIS — I5033 Acute on chronic diastolic (congestive) heart failure: Secondary | ICD-10-CM

## 2019-05-29 DIAGNOSIS — I480 Paroxysmal atrial fibrillation: Secondary | ICD-10-CM

## 2019-05-29 LAB — CUP PACEART REMOTE DEVICE CHECK
Battery Impedance: 1371 Ohm
Battery Remaining Longevity: 46 mo
Battery Voltage: 2.77 V
Brady Statistic AP VP Percent: 72 %
Brady Statistic AP VS Percent: 1 %
Brady Statistic AS VP Percent: 24 %
Brady Statistic AS VS Percent: 3 %
Date Time Interrogation Session: 20200929171744
Implantable Lead Implant Date: 20110725
Implantable Lead Implant Date: 20110725
Implantable Lead Location: 753859
Implantable Lead Location: 753860
Implantable Lead Model: 5076
Implantable Lead Model: 5076
Implantable Pulse Generator Implant Date: 20110725
Lead Channel Impedance Value: 495 Ohm
Lead Channel Impedance Value: 650 Ohm
Lead Channel Pacing Threshold Amplitude: 0.5 V
Lead Channel Pacing Threshold Amplitude: 0.625 V
Lead Channel Pacing Threshold Pulse Width: 0.4 ms
Lead Channel Pacing Threshold Pulse Width: 0.4 ms
Lead Channel Setting Pacing Amplitude: 2 V
Lead Channel Setting Pacing Amplitude: 2.5 V
Lead Channel Setting Pacing Pulse Width: 0.4 ms
Lead Channel Setting Sensing Sensitivity: 2.8 mV

## 2019-06-08 NOTE — Progress Notes (Signed)
Remote pacemaker transmission.   

## 2019-06-15 ENCOUNTER — Telehealth: Payer: Self-pay | Admitting: *Deleted

## 2019-06-15 DIAGNOSIS — Z23 Encounter for immunization: Secondary | ICD-10-CM | POA: Diagnosis not present

## 2019-06-15 NOTE — Telephone Encounter (Signed)
Returned patient's call at [phone] (home #) to confirm their appointment.  Next appointment:  Wednesday, June 20, 2019 11:30 am Dr.Gallaway  For routine foot care  I called patient to let them know that they did have the right day and time and we would see him then. He thanked me for calling and said he would wear his mask.

## 2019-06-20 ENCOUNTER — Other Ambulatory Visit: Payer: Self-pay

## 2019-06-20 ENCOUNTER — Ambulatory Visit (INDEPENDENT_AMBULATORY_CARE_PROVIDER_SITE_OTHER): Payer: Medicare Other | Admitting: Podiatry

## 2019-06-20 ENCOUNTER — Encounter: Payer: Self-pay | Admitting: Podiatry

## 2019-06-20 DIAGNOSIS — B351 Tinea unguium: Secondary | ICD-10-CM

## 2019-06-20 DIAGNOSIS — Z9229 Personal history of other drug therapy: Secondary | ICD-10-CM

## 2019-06-20 DIAGNOSIS — L84 Corns and callosities: Secondary | ICD-10-CM

## 2019-06-20 DIAGNOSIS — M79675 Pain in left toe(s): Secondary | ICD-10-CM | POA: Diagnosis not present

## 2019-06-20 DIAGNOSIS — M79674 Pain in right toe(s): Secondary | ICD-10-CM

## 2019-06-20 NOTE — Patient Instructions (Signed)

## 2019-06-24 NOTE — Progress Notes (Signed)
Subjective: Dalton Townsend is seen today for follow up painful corns and elongated, thickened toenails 1-5 b/l feet that he cannot cut. Pain interferes with daily activities. Aggravating factor includes wearing enclosed shoe gear and relieved with periodic debridement.  He is on blood thinner, Eliquis.  Current Outpatient Medications on File Prior to Visit  Medication Sig  . acetaminophen (TYLENOL) 500 MG tablet Take 1,000 mg by mouth at bedtime.  [name] amLODipine (NORVASC) 5 MG tablet Take 5 mg by mouth daily.  [name] amoxicillin (AMOXIL) 500 MG capsule Take 500 mg by mouth 4 (four) times daily as needed. Patient reports taking 4 capsules before any dental procedure.  [name] aspirin 81 MG chewable tablet Chew 1 tablet (81 mg total) by mouth daily.  [name] atorvastatin (LIPITOR) 20 MG tablet Take 20 mg by mouth every other day.   . balsalazide (COLAZAL) 750 MG capsule Take 2,250 mg by mouth 3 (three) times daily.  [name] ELIQUIS 5 MG TABS tablet TAKE 1 TABLET TWICE A DAY  . escitalopram (LEXAPRO) 10 MG tablet Take 10 mg by mouth daily.    . finasteride (PROSCAR) 5 MG tablet Take 5 mg by mouth See admin instructions. Take one tablet (5 mg) by mouth every day except Wednesdays  . furosemide (LASIX) 40 MG tablet Take 1 tablet (40 mg total) by mouth daily.  [name] latanoprost (XALATAN) 0.005 % ophthalmic solution Place 1 drop into both eyes at bedtime.   [name] losartan (COZAAR) 100 MG tablet Take 1 tablet (100 mg total) by mouth daily.  . mesalamine (CANASA) 1000 MG suppository Place 1,000 mg rectally at bedtime as needed (ulcerative colitis).  . metoprolol succinate (TOPROL-XL) 50 MG 24 hr tablet Take 50 mg by mouth daily. Take with or immediately following a meal.  . Multiple Vitamin (MULTIVITAMIN WITH MINERALS) TABS tablet Take 1 tablet by mouth daily.   No current facility-administered medications on file prior to visit.      Allergies  Allergen Reactions  . No Known Allergies      Objective:  Vascular  Examination: Capillary refill time immediate x 10 digits.  Dorsalis pedis present b/l.  Posterior tibial pulses present b/l.  Digital hair sparse b/l.  Skin temperature gradient WNL b/l.   Dermatological Examination: Skin with normal turgor, texture and tone b/l.  Toenails 2-5 b/l discolored, thick, dystrophic with subungual debris and pain with palpation to nailbeds due to thickness of nails.  Anonychia b/l great toe(s) with evidence of permanent total nail avulsion. Nailbed(s) completely epithelialized and intact.  Porokeratotic lesion distal tip right 2nd digit and left 3rd digit . No erythema, no edema, no drainage, no flocculence noted.  Musculoskeletal: Muscle strength 5/5 to all LE muscle groups b/l.  No pain, crepitus or joint limitation noted with ROM.   Neurological Examination: Protective sensation intact with 10 gram monofilament bilaterally.  Assessment: Painful onychomycosis toenails 2-5 b/l  Corn distal tip right 2nd digit and left 3rd digit Pt on long term blood thinner  Plan: 1. Toenails 2-5 b/l were debrided in length and girth without iatrogenic bleeding. 2. Corn(s) pared right 2nd digit and left 3rd digit utilizing sterile scalpel blade without incident.Patient to continue soft, supportive shoe gear. 3. Patient to report any pedal injuries to medical professional immediately. 4. Follow up 3 months.  5. Patient/POA to call should there be a concern in the interim.

## 2019-06-25 ENCOUNTER — Other Ambulatory Visit: Payer: Self-pay | Admitting: Cardiology

## 2019-06-26 NOTE — Telephone Encounter (Signed)
Pt last saw Dr Marlou Porch 05/14/19, last labs 09/07/18 Creat 1.0, age 85, weight 83kg, based on specified criteria pt is on appropriate dosage of Eliquis 15m BID.  Will refill rx.

## 2019-07-12 ENCOUNTER — Telehealth: Payer: Self-pay

## 2019-08-28 ENCOUNTER — Ambulatory Visit (INDEPENDENT_AMBULATORY_CARE_PROVIDER_SITE_OTHER): Payer: Medicare Other | Admitting: *Deleted

## 2019-08-28 DIAGNOSIS — I480 Paroxysmal atrial fibrillation: Secondary | ICD-10-CM | POA: Diagnosis not present

## 2019-08-28 LAB — CUP PACEART REMOTE DEVICE CHECK
Battery Impedance: 1481 Ohm
Battery Remaining Longevity: 43 mo
Battery Voltage: 2.76 V
Brady Statistic AP VP Percent: 68 %
Brady Statistic AP VS Percent: 1 %
Brady Statistic AS VP Percent: 29 %
Brady Statistic AS VS Percent: 2 %
Date Time Interrogation Session: 20201229152023
Implantable Lead Implant Date: 20110725
Implantable Lead Implant Date: 20110725
Implantable Lead Location: 753859
Implantable Lead Location: 753860
Implantable Lead Model: 5076
Implantable Lead Model: 5076
Implantable Pulse Generator Implant Date: 20110725
Lead Channel Impedance Value: 488 Ohm
Lead Channel Impedance Value: 652 Ohm
Lead Channel Pacing Threshold Amplitude: 0.375 V
Lead Channel Pacing Threshold Amplitude: 0.75 V
Lead Channel Pacing Threshold Pulse Width: 0.4 ms
Lead Channel Pacing Threshold Pulse Width: 0.4 ms
Lead Channel Setting Pacing Amplitude: 2 V
Lead Channel Setting Pacing Amplitude: 2.5 V
Lead Channel Setting Pacing Pulse Width: 0.4 ms
Lead Channel Setting Sensing Sensitivity: 2.8 mV

## 2019-09-07 ENCOUNTER — Encounter: Payer: Self-pay | Admitting: *Deleted

## 2019-09-07 ENCOUNTER — Other Ambulatory Visit: Payer: Self-pay | Admitting: *Deleted

## 2019-09-07 NOTE — Patient Outreach (Signed)
[facility]) Care Management  [place]  09/07/2019   HOSEY BURMESTER [id number]   RN Health Coach telephone call to patient.  [employer] compliance verified. Successful outreach: Patient agreeable to participate in program. Patient currently lives at home with wife for support. Patient states that he weighs weekly. Patient explained that he does have slight swelling in rt leg. He wears compression hose daily and elevates legs mostly when sitting. Reports that he had eaten quite a bit of high sodium foods during the holiday but will get back on low sodium diet. Patient has agreed to further outreach calls.  Encounter Medications:  Outpatient Encounter Medications as of 09/07/2019  Medication Sig  . acetaminophen (TYLENOL) 500 MG tablet Take 1,000 mg by mouth at bedtime.  [name] amLODipine (NORVASC) 5 MG tablet Take 5 mg by mouth daily.  [name] amoxicillin (AMOXIL) 500 MG capsule Take 500 mg by mouth 4 (four) times daily as needed. Patient reports taking 4 capsules before any dental procedure.  [name] aspirin 81 MG chewable tablet Chew 1 tablet (81 mg total) by mouth daily.  [name] atorvastatin (LIPITOR) 20 MG tablet Take 20 mg by mouth every other day.   . balsalazide (COLAZAL) 750 MG capsule Take 2,250 mg by mouth 3 (three) times daily.  [name] ELIQUIS 5 MG TABS tablet TAKE 1 TABLET TWICE A DAY  . escitalopram (LEXAPRO) 10 MG tablet Take 10 mg by mouth daily.    . finasteride (PROSCAR) 5 MG tablet Take 5 mg by mouth See admin instructions. Take one tablet (5 mg) by mouth every day except Wednesdays  . furosemide (LASIX) 40 MG tablet Take 1 tablet (40 mg total) by mouth daily.  [name] latanoprost (XALATAN) 0.005 % ophthalmic solution Place 1 drop into both eyes at bedtime.   [name] losartan (COZAAR) 100 MG tablet Take 1 tablet (100 mg total) by mouth daily.  . mesalamine (CANASA) 1000 MG suppository Place 1,000 mg rectally at bedtime as needed (ulcerative colitis).  . metoprolol succinate  (TOPROL-XL) 50 MG 24 hr tablet Take 50 mg by mouth daily. Take with or immediately following a meal.  . Multiple Vitamin (MULTIVITAMIN WITH MINERALS) TABS tablet Take 1 tablet by mouth daily.   No facility-administered encounter medications on file as of 09/07/2019.    Functional Status:  In your present state of health, do you have any difficulty performing the following activities: 09/07/2019  Hearing? N  Vision? N  Difficulty concentrating or making decisions? N  Walking or climbing stairs? Y  Comment uses a walker and has some swelling in left leg  Dressing or bathing? N  Doing errands, shopping? Y  Preparing Food and eating ? Y  Comment wife assists  Using the Toilet? N  In the past six months, have you accidently leaked urine? N  Comment followed by urologist and on medications  Do you have problems with loss of bowel control? N  Comment followed by gastroenterologist  Managing your Medications? N  Managing your Finances? N  Housekeeping or managing your Housekeeping? Y  Some recent data might be hidden    Fall/Depression Screening: Fall Risk  09/07/2019  Falls in the past year? 1  Number falls in past yr: 0  Injury with Fall? 1  Risk for fall due to : History of fall(s);Impaired balance/gait;Impaired mobility  Follow up Falls evaluation completed;Falls prevention discussed   No flowsheet data found. [facility] CM Care Plan Problem One     Most Recent Value  Care  Plan Problem One  Knowledge Deficit in Self management of Congestive Heart Failure  Role Documenting the Problem One  [place] for Problem One  Active  [facility] Long Term Goal   Patient will not have any CHF exacerbation within the next 90 days  [facility] Long Term Goal Start Date  09/07/19  Interventions for Problem One Long Term Goal  RN discussed CHF. RN will send CHF packet. RN will follow up with further discussion  [facility] CM Short Term Goal #1   Patient will verbalize weighing and document weights within the next  30 days  [facility] CM Short Term Goal #1 Start Date  09/07/19  Interventions for Short Term Goal #1  Rn educated the importance of weighing . [place] will send a 2021 Calendar book for recording. RN will follow up for compliance  [facility] CM Short Term Goal #2   Patient will verbalize having a better understanding of foods high and low in sodium within the next 30 days  [facility] CM Short Term Goal #2 Start Date  09/07/19  Interventions for Short Term Goal #2  RN discussed low sodium diet with patient. RN sent a picture sheet of foods high and low in sodium      Assessment:  Patient weigh self weekly Reports eating heart healthy and low sodium diet Patient agreed to further outreach calls Patient will benefit from [place] telephonic outreach for education and support for congestive heart failure self management.  Plan:  RN discussed low sodium diet RN discussed weighing RN discussed CHF RN will send a 2021 calendar for documentation RN will send a CHF packet RN will send a Welcome packet RN will send a picture sheet of foods low and high in sodium RN will send barrier letter and assessment to PCP RN will follow up within the month of April  Neizan Debruhl [place] Care Management [phone]

## 2019-09-09 ENCOUNTER — Other Ambulatory Visit: Payer: Self-pay

## 2019-09-09 ENCOUNTER — Ambulatory Visit: Payer: Medicare Other | Attending: Internal Medicine

## 2019-09-09 DIAGNOSIS — Z23 Encounter for immunization: Secondary | ICD-10-CM | POA: Diagnosis not present

## 2019-09-09 NOTE — Progress Notes (Signed)
   Covid-19 Vaccination Clinic  Name:  Dalton Townsend    MRN: [id number] DOB: 10-12-1922  09/09/2019  Dalton Townsend was observed post Covid-19 immunization for 30 minutes based on pre-vaccination screening without incidence. He was provided with Vaccine Information Sheet and instruction to access the V-Safe system.   Dalton Townsend was instructed to call 911 with any severe reactions post vaccine: [name] Difficulty breathing  . Swelling of your face and throat  . A fast heartbeat  . A bad rash all over your body  . Dizziness and weakness    Immunizations Administered    Name Date Dose VIS Date Route   Pfizer COVID-19 Vaccine 09/09/2019 12:16 PM 0.3 mL 08/10/2019 Intramuscular   Manufacturer: [employer]   Lot: [id number]   [place]: [id number]

## 2019-09-28 ENCOUNTER — Ambulatory Visit (INDEPENDENT_AMBULATORY_CARE_PROVIDER_SITE_OTHER): Payer: Medicare Other | Admitting: Podiatry

## 2019-09-28 ENCOUNTER — Encounter: Payer: Self-pay | Admitting: Podiatry

## 2019-09-28 ENCOUNTER — Other Ambulatory Visit: Payer: Self-pay

## 2019-09-28 DIAGNOSIS — M79675 Pain in left toe(s): Secondary | ICD-10-CM

## 2019-09-28 DIAGNOSIS — M79674 Pain in right toe(s): Secondary | ICD-10-CM | POA: Diagnosis not present

## 2019-09-28 DIAGNOSIS — M79672 Pain in left foot: Secondary | ICD-10-CM

## 2019-09-28 DIAGNOSIS — M79671 Pain in right foot: Secondary | ICD-10-CM

## 2019-09-28 DIAGNOSIS — L84 Corns and callosities: Secondary | ICD-10-CM

## 2019-09-28 DIAGNOSIS — B351 Tinea unguium: Secondary | ICD-10-CM

## 2019-09-28 DIAGNOSIS — Z7901 Long term (current) use of anticoagulants: Secondary | ICD-10-CM | POA: Diagnosis not present

## 2019-09-28 NOTE — Patient Instructions (Addendum)
APPLY NEOSPORIN CREAM TO LEFT 3RD DIGIT ONCE DAILY AND LEAVE OPEN TO AIR FOR ONE WEEK.    PLEASE START WEARING YOUR NEW HOUSE SLIPPERS ON TODAY.  Diabetes Mellitus and Foot Care Foot care is an important part of your health, especially when you have diabetes. Diabetes may cause you to have problems because of poor blood flow (circulation) to your feet and legs, which can cause your skin to:  Become thinner and drier.  Break more easily.  Heal more slowly.  Peel and crack. You may also have nerve damage (neuropathy) in your legs and feet, causing decreased feeling in them. This means that you may not notice minor injuries to your feet that could lead to more serious problems. Noticing and addressing any potential problems early is the best way to prevent future foot problems. How to care for your feet Foot hygiene  Wash your feet daily with warm water and mild soap. Do not use hot water. Then, pat your feet and the areas between your toes until they are completely dry. Do not soak your feet as this can dry your skin.  Trim your toenails straight across. Do not dig under them or around the cuticle. File the edges of your nails with an emery board or nail file.  Apply a moisturizing lotion or petroleum jelly to the skin on your feet and to dry, brittle toenails. Use lotion that does not contain alcohol and is unscented. Do not apply lotion between your toes. Shoes and socks  Wear clean socks or stockings every day. Make sure they are not too tight. Do not wear knee-high stockings since they may decrease blood flow to your legs.  Wear shoes that fit properly and have enough cushioning. Always look in your shoes before you put them on to be sure there are no objects inside.  To break in new shoes, wear them for just a few hours a day. This prevents injuries on your feet. Wounds, scrapes, corns, and calluses  Check your feet daily for blisters, cuts, bruises, sores, and redness. If you  cannot see the bottom of your feet, use a mirror or ask someone for help.  Do not cut corns or calluses or try to remove them with medicine.  If you find a minor scrape, cut, or break in the skin on your feet, keep it and the skin around it clean and dry. You may clean these areas with mild soap and water. Do not clean the area with peroxide, alcohol, or iodine.  If you have a wound, scrape, corn, or callus on your foot, look at it several times a day to make sure it is healing and not infected. Check for: ? Redness, swelling, or pain. ? Fluid or blood. ? Warmth. ? Pus or a bad smell. General instructions  Do not cross your legs. This may decrease blood flow to your feet.  Do not use heating pads or hot water bottles on your feet. They may burn your skin. If you have lost feeling in your feet or legs, you may not know this is happening until it is too late.  Protect your feet from hot and cold by wearing shoes, such as at the beach or on hot pavement.  Schedule a complete foot exam at least once a year (annually) or more often if you have foot problems. If you have foot problems, report any cuts, sores, or bruises to your health care provider immediately. Contact a health care provider if:  You have a medical condition that increases your risk of infection and you have any cuts, sores, or bruises on your feet.  You have an injury that is not healing.  You have redness on your legs or feet.  You feel burning or tingling in your legs or feet.  You have pain or cramps in your legs and feet.  Your legs or feet are numb.  Your feet always feel cold.  You have pain around a toenail. Get help right away if:  You have a wound, scrape, corn, or callus on your foot and: ? You have pain, swelling, or redness that gets worse. ? You have fluid or blood coming from the wound, scrape, corn, or callus. ? Your wound, scrape, corn, or callus feels warm to the touch. ? You have pus or a bad  smell coming from the wound, scrape, corn, or callus. ? You have a fever. ? You have a red line going up your leg. Summary  Check your feet every day for cuts, sores, red spots, swelling, and blisters.  Moisturize feet and legs daily.  Wear shoes that fit properly and have enough cushioning.  If you have foot problems, report any cuts, sores, or bruises to your health care provider immediately.  Schedule a complete foot exam at least once a year (annually) or more often if you have foot problems. This information is not intended to replace advice given to you by your health care provider. Make sure you discuss any questions you have with your health care provider. Document Revised: 05/09/2019 Document Reviewed: 09/17/2016 Elsevier Patient Education  [place].

## 2019-09-29 NOTE — Progress Notes (Signed)
Subjective: Dalton Townsend presents today for follow up of painful mycotic nails b/l.  Pain interferes with ambulation. Aggravating factors include wearing enclosed shoe gear. Pt also presents with painful corn(s) distal tip right 2nd digit and left 3rd digit.  He states his corns have gotten better. Patient complains of painful callus plantar aspect of left hallux.  Allergies  Allergen Reactions  . No Known Allergies     Objective: There were no vitals filed for this visit.  Vascular Examination:  Capillary refill time to digits immediate b/l, palpable DP pulses b/l, palpable PT pulses b/l, pedal hair present b/l and skin temperature gradient within normal limits b/l  Dermatological Examination: Pedal skin with normal turgor, texture and tone bilaterally, no open wounds bilaterally, no interdigital macerations bilaterally and toenails 2-5 b/l elongated, dystrophic, thickened, crumbly with subungual debris.  Porokeratotic lesion distal tip left 3rd digit and right 2nd digit. No erythema, no edema, no drainage, no flocculence noted.   Anonychia b/l great toe(s) with evidence of permanent total nail avulsion. Nailbed(s) completely epithelialized and intact.  Musculoskeletal: Normal muscle strength 5/5 to all lower extremity muscle groups bilaterally, no pain crepitus or joint limitation noted with ROM b/l and hammertoes noted to the 2-5 bilaterally  Neurological: Protective sensation intact 5/5 intact bilaterally with 10g monofilament b/l  Assessment: 1. Pain due to onychomycosis of toenails of both feet   2. Corns and callosities   3. Chronic anticoagulation   4. Pain in both feet     Plan: -Toenails 2-5 b/l were debrided in length and girth without iatrogenic bleeding. -corns and calluses were debrided without incident. Total number debrided = distal tip right 2nd,  left 3rd digit and plantar left heel. Pt to apply Neosporin Cream to left 3rd digit once daily for one  week. -Patient to continue soft, supportive shoe gear daily. -Patient to report any pedal injuries to medical professional immediately. -Patient/POA to call should there be question/concern in the interim.  Return in about 3 months (around 12/27/2019) for diabetic nail and callus trim/ Eliquis.

## 2019-09-30 ENCOUNTER — Ambulatory Visit: Payer: Medicare Other | Attending: Internal Medicine

## 2019-09-30 DIAGNOSIS — Z23 Encounter for immunization: Secondary | ICD-10-CM

## 2019-09-30 IMAGING — CT CT ANGIO CHEST
1 of 5 series · 1 of 28 positions shown · IV contrast (iopamidol)
Comparison: CT the abdomen and pelvis 05/14/2010.

CLINICAL DATA: [AGE] male with history of severe aortic
stenosis. Preprocedural study prior to potential transcatheter
aortic valve replacement (TAVR).

EXAM:
CT ANGIOGRAPHY CHEST, ABDOMEN AND PELVIS
TECHNIQUE: Multidetector CT imaging through the chest, abdomen and pelvis was
performed using the standard protocol during bolus administration of
intravenous contrast. Multiplanar reconstructed images and MIPs were
obtained and reviewed to evaluate the vascular anatomy.
CONTRAST:  100mL 5ZNVIL-LCK IOPAMIDOL (5ZNVIL-LCK) INJECTION 76%

[Series 286: — · 0.50mm/px · 1 of 8 slices shown]
[im 5/8]
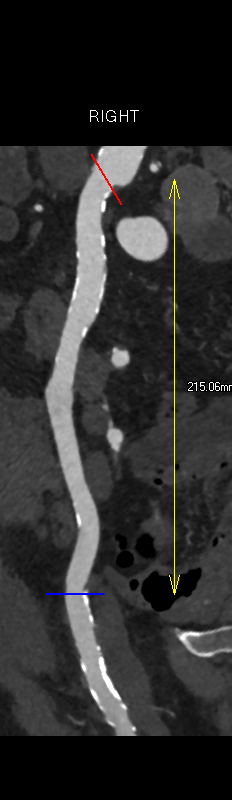

[1 of 28 positions shown; findings below may reference images not displayed]

FINDINGS: CTA CHEST FINDINGS

Cardiovascular: Heart size is mildly enlarged. There is no
significant pericardial fluid, thickening or pericardial
calcification. There is aortic atherosclerosis, as well as
atherosclerosis of the great vessels of the mediastinum and the
coronary arteries, including calcified atherosclerotic plaque in the
left main, left anterior descending, left circumflex and right
coronary arteries. Status post median sternotomy for CABG including
[REDACTED] to the LAD. Severe thickening calcification of the aortic
valve. Left-sided pacemaker with lead tips terminating in the right
atrial appendage and near the right ventricular apex.

Mediastinum/Lymph Nodes: No pathologically enlarged mediastinal or
hilar lymph nodes. Esophagus is unremarkable in appearance. No
axillary lymphadenopathy.

Lungs/Pleura: Small right and trace left pleural effusions lying
dependently. No acute consolidative airspace disease. Two tiny
pulmonary nodules are noted in the right lung, largest of which
measures 4 mm in the lateral segment of the right middle lobe (axial
image 51 of series 15). No larger more suspicious appearing
pulmonary nodules or masses are noted.

Musculoskeletal/Soft Tissues: There are no aggressive appearing
lytic or blastic lesions noted in the visualized portions of the
skeleton. Median sternotomy wires.

CTA ABDOMEN AND PELVIS FINDINGS

Hepatobiliary: 11 mm low-attenuation lesion in segment 4A of the
liver, compatible with a simple cyst. No other larger more
suspicious appearing hepatic lesions are noted. No intra or
extrahepatic biliary ductal dilatation. Gallbladder is moderately
distended, but otherwise unremarkable in appearance.

Pancreas: No pancreatic mass. No pancreatic ductal dilatation. No
pancreatic or peripancreatic fluid or inflammatory changes.

Spleen: Unremarkable.

Adrenals/Urinary Tract: Multiple low-attenuation lesions in both
kidneys, compatible with simple cysts, measuring up to 19 mm in
diameter in the upper and lower poles of the right kidney. 7 mm
nonobstructive calculus in the upper pole collecting system of the
left kidney. No hydroureteronephrosis. Urinary bladder is
unremarkable in appearance.

Stomach/Bowel: Normal appearance of the stomach. No pathologic
dilatation of small bowel or colon. A few scattered colonic
diverticulae are noted, particularly in the sigmoid colon, without
surrounding inflammatory changes to suggest an acute diverticulitis
at this time. Normal appendix.

Vascular/Lymphatic: Aortic atherosclerosis with vascular findings
and measurements pertinent to potential TAVR procedure, as detailed
below. There is also aneurysmal dilatation of the left common iliac
artery which measures up to 2.9 cm in diameter. Separate origin of
the common hepatic artery directly off the aorta, with moderate
ostial narrowing of this vessel where there is a mean diameter of
only 5 mm. Remaining portions of the celiac axis are otherwise
widely patent without hemodynamically significant stenosis. There is
also moderate narrowing of the ostium of the superior mesenteric
artery which also has a mean diameter 5 mm. Inferior mesenteric
artery is widely patent without hemodynamically significant
stenosis. Two right-sided renal arteries, the larger of which
demonstrates very mild ostial narrowing. Single left renal artery is
widely patent without hemodynamically significant stenosis.

Reproductive: Prostate gland and seminal vesicles are unremarkable
in appearance.

Other: No significant volume of ascites.  No pneumoperitoneum.

Musculoskeletal: There are no aggressive appearing lytic or blastic
lesions noted in the visualized portions of the skeleton.

VASCULAR MEASUREMENTS PERTINENT TO TAVR:

AORTA:

Minimal Aortic Liameter-VT x 16 mm

Severity of Aortic Calcification-severe

RIGHT PELVIS:

Right Common Iliac Artery -

Minimal Jiameter-II.1 x 10.4 mm

Tortuosity-moderate

Calcification-moderate

Right External Iliac Artery -

Minimal Niameter-R.A x 8.1 mm

Tortuosity-severe

Calcification-mild

Right Common Femoral Artery -

Minimal Piameter-3.8 x 6.6 mm

Tortuosity-mild

Calcification-severe

LEFT PELVIS:

Left Common Iliac Artery -

Minimal 5iameter-IR.N x 5.0 mm

Tortuosity-severe

Calcification-moderate

Left External Iliac Artery -

Minimal 3iameter-10.0 x 8.5 mm

Tortuosity-severe

Calcification-mild

Left Common Femoral Artery -

Minimal Hiameter-S.L x 8.3 mm

Tortuosity-mild

Calcification-severe

Review of the MIP images confirms the above findings.
IMPRESSION: 1. Vascular findings and measurements pertinent to potential TAVR
procedure, as detailed above.
2. Severe thickening calcification of the aortic valve, compatible
with the reported clinical history of severe aortic stenosis.
3. Aortic atherosclerosis, in addition to left main and 3 vessel
coronary artery disease. Status post median sternotomy for CABG
including [REDACTED] to the LAD.
4. Aneurysmal dilatation of the left common iliac artery which
measures up to 2.9 cm in diameter.
5. Moderate ostial narrowing of the common hepatic artery (direct
origin from the abdominal aorta) and the superior mesenteric artery,
both of which measure 5 mm in diameter.
6. Colonic diverticulosis without evidence of acute diverticulitis
at this time.
7. 7 mm nonobstructive calculus in the upper pole collecting system
of left kidney.
8. Small right and trace left pleural effusions lying dependently.
9. Small pulmonary nodules in the right lung, largest of which
measures only 4 mm in the right middle lobe. No follow-up needed if
patient is low-risk (and has no known or suspected primary
neoplasm). Non-contrast chest CT can be considered in 12 months if
patient is high-risk. This recommendation follows the consensus
statement: Guidelines for Management of Incidental Pulmonary Nodules
Detected on CT Images: From the [HOSPITAL] 3570; Radiology
3570; [DATE].
10. Additional incidental findings, as above.

Aortic Atherosclerosis ([id number]-ZG1.1).

## 2019-09-30 NOTE — Progress Notes (Signed)
   Covid-19 Vaccination Clinic  Name:  Dalton Townsend    MRN: [id number] DOB: 04-21-1923  09/30/2019  [name] was observed post Covid-19 immunization for 15 minutes without incidence. He was provided with Vaccine Information Sheet and instruction to access the V-Safe system.   [name] was instructed to call 911 with any severe reactions post vaccine: [name] Difficulty breathing  . Swelling of your face and throat  . A fast heartbeat  . A bad rash all over your body  . Dizziness and weakness    Immunizations Administered    Name Date Dose VIS Date Route   Pfizer COVID-19 Vaccine 09/30/2019 11:32 AM 0.3 mL 08/10/2019 Intramuscular   Manufacturer: [place]   Lot: [id number]   [place]: [id number]

## 2019-11-20 DIAGNOSIS — H52203 Unspecified astigmatism, bilateral: Secondary | ICD-10-CM | POA: Diagnosis not present

## 2019-11-20 DIAGNOSIS — Z961 Presence of intraocular lens: Secondary | ICD-10-CM | POA: Diagnosis not present

## 2019-11-20 DIAGNOSIS — H401131 Primary open-angle glaucoma, bilateral, mild stage: Secondary | ICD-10-CM | POA: Diagnosis not present

## 2019-11-21 NOTE — Progress Notes (Signed)
CARDIOLOGY OFFICE NOTE  Date:  11/28/2019    Dalton Townsend Date of Birth: [birth year] Medical Record #[id number]  PCP:  Lajean Manes, MD  Cardiologist:  [facility] Chief Complaint  Patient presents with  . Follow-up    Seen for Dr. Marlou Porch    History of Present Illness: Dalton Townsend is a 86 y.o. male who presents today for a follow up visit. Seen for Dr. Marlou Porch.   He has a history of CAD with remote CABG in 1999 - last cath in 03/2018 showing all grafts patent but with carotid disease noted of 99% on the R, severe AS - s/p TAVR in 2019 via trans apical approach, underlying PPM, PAF, PAD with emergent bypass following attempt at transfemoral TAVR.   Last seen in September by Dr. Marlou Porch - felt to be doing well.   The patient does not have symptoms concerning for COVID-19 infection (fever, chills, cough, or new shortness of breath).   Comes in today. Here with his son - Dalton Townsend. He has had both vaccines. He has been staying in for the past year. [facility] Not as active. Weight is up. No real swelling. He thought he felt more shortness of breath a few weeks ago with the weather change - says that is better now. BP typically better at home - but only checks about once a month. No chest pain. No falls. Tolerating his Eliquis. His lab from December is noted.    Past Medical History:  Diagnosis Date  . BPH (benign prostatic hyperplasia)   . CAD (coronary artery disease)    a. s/p CABG x6V in 1999, cath 03/2018 showed patent 6/6 bypass grafts  . Carotid artery disease ([place])    a. s/p R CEA, 80-99% restenosis of RICA, to be address after TAVR  . Dyslipidemia   . Essential hypertension   . GAD (generalized anxiety disorder)   . Intestinal infection due to Clostridium difficile 03/2010  . PAF (paroxysmal atrial fibrillation) (HCC)    a. on Eliquis  . Presence of permanent cardiac pacemaker   . PVD (peripheral vascular disease) ([place])   . S/P femoral-femoral bypass  surgery    a. emergent fem fem bypass due to iliac complication during first attempt TAVR placement on 04/11/18 by Dr. Scot Dock  . S/P TAVR (transcatheter aortic valve replacement)    a. 04/18/18: s/p succesful TAVR with a [place] 3 THV  . Severe aortic stenosis   . Skin cancer   . Ulcerative colitis     Past Surgical History:  Procedure Laterality Date  . BALLOON AORTIC VALVE VALVULOPLASTY N/A 04/11/2018   Procedure: BALLOON AORTIC VALVE VALVULOPLASTY.  RIGHT EXTERNAL ILIAC ARTERY OVERSEW.;  Surgeon: Sherren Mocha, MD;  Location: [place];  Service: Open Heart Surgery;  Laterality: N/A;  . CARDIAC CATHETERIZATION  1999  . CAROTID ENDARTERECTOMY Right 2004  . CATARACT EXTRACTION, BILATERAL  2011  . CORONARY ARTERY BYPASS GRAFT  04/1998   3 vessel CAD  . ENDARTERECTOMY FEMORAL Right 04/11/2018   Procedure: ENDARTERECTOMY FEMORAL;  Surgeon: Sherren Mocha, MD;  Location: [place];  Service: Open Heart Surgery;  Laterality: Right;  . FEMORAL-FEMORAL BYPASS GRAFT N/A 04/11/2018   Procedure: LEFT TO RIGHT BYPASS GRAFT FEMORAL-FEMORAL ARTERY;  Surgeon: Sherren Mocha, MD;  Location: [place];  Service: Open Heart Surgery;  Laterality: N/A;  . INCISION AND DRAINAGE OF WOUND Left 05/04/2016   Procedure: SURGICAL PREP FOR GRAFTING LEFT LEG WITH THERA SKIN APPLICATION;  Surgeon: Irene Limbo, MD;  Location: [place];  Service: Plastics;  Laterality: Left;  . INTRAOPERATIVE ARTERIOGRAM  06/2000  . INTRAOPERATIVE TRANSTHORACIC ECHOCARDIOGRAM  04/11/2018   Procedure: INTRAOPERATIVE TRANSTHORACIC ECHOCARDIOGRAM;  Surgeon: Sherren Mocha, MD;  Location: [facility] OR;  Service: Open Heart Surgery;;  . PACEMAKER INSERTION  03/25/2010  . RIGHT/LEFT HEART CATH AND CORONARY/GRAFT ANGIOGRAPHY N/A 04/05/2018   Procedure: RIGHT/LEFT HEART CATH AND CORONARY/GRAFT ANGIOGRAPHY;  Surgeon: Burnell Blanks, MD;  Location: [place] [facility];  Service: Cardiovascular;  Laterality: N/A;  . TEE WITHOUT CARDIOVERSION N/A  04/18/2018   Procedure: TRANSESOPHAGEAL ECHOCARDIOGRAM (TEE);  Surgeon: Sherren Mocha, MD;  Location: [place];  Service: Open Heart Surgery;  Laterality: N/A;  . TOTAL KNEE ARTHROPLASTY  07/16/09  . TRANSCATHETER AORTIC VALVE REPLACEMENT, TRANSAPICAL N/A 04/18/2018   Procedure: TRANSCATHETER AORTIC VALVE REPLACEMENT, TRANSAPICAL. 34m EDWARDS SAPIEN 3 TRANSCATHETER HEART VALVE.;  Surgeon: CSherren Mocha MD;  Location: [place]  Service: Open Heart Surgery;  Laterality: N/A;  . TRANSURETHRAL RESECTION OF PROSTATE  1998     Medications: Current Meds  Medication Sig  . acetaminophen (TYLENOL) 500 MG tablet Take 1,000 mg by mouth at bedtime.  .Marland KitchenamLODipine (NORVASC) 5 MG tablet Take 5 mg by mouth daily.  .Marland Kitchenamoxicillin (AMOXIL) 500 MG capsule Take 500 mg by mouth 4 (four) times daily as needed. Patient reports taking 4 capsules before any dental procedure.  .Marland Kitchenaspirin 81 MG chewable tablet Chew 1 tablet (81 mg total) by mouth daily.  .Marland Kitchenatorvastatin (LIPITOR) 20 MG tablet Take 20 mg by mouth every other day.   . balsalazide (COLAZAL) 750 MG capsule Take 2,250 mg by mouth 3 (three) times daily.  .Marland KitchenELIQUIS 5 MG TABS tablet TAKE 1 TABLET TWICE A DAY  . escitalopram (LEXAPRO) 10 MG tablet Take 10 mg by mouth daily.    . finasteride (PROSCAR) 5 MG tablet Take 5 mg by mouth See admin instructions. Take one tablet (5 mg) by mouth every day except Wednesdays  . furosemide (LASIX) 40 MG tablet Take 1 tablet (40 mg total) by mouth daily.  .Marland Kitchenlatanoprost (XALATAN) 0.005 % ophthalmic solution Place 1 drop into both eyes at bedtime.   .Marland Kitchenlosartan (COZAAR) 100 MG tablet Take 1 tablet (100 mg total) by mouth daily.  . mesalamine (CANASA) 1000 MG suppository Place 1,000 mg rectally at bedtime as needed (ulcerative colitis).  . metoprolol succinate (TOPROL-XL) 50 MG 24 hr tablet Take 50 mg by mouth daily. Take with or immediately following a meal.  . Multiple Vitamin (MULTIVITAMIN WITH MINERALS) TABS tablet Take 1  tablet by mouth daily.     Allergies: Allergies  Allergen Reactions  . No Known Allergies     Social History: The patient  reports that he quit smoking about 67 years ago. His smoking use included cigarettes. He quit after 10.00 years of use. He has quit using smokeless tobacco. He reports that he does not drink alcohol or use drugs.   Family History: The patient's family history includes Cancer in an other family member; Congestive Heart Failure in his mother; Heart failure in an other family member; Leukemia in his father and another family member.   Review of Systems: Please see the history of present illness.   All other systems are reviewed and negative.   Physical Exam: VS:  BP (!) 160/86   Pulse 68   Ht 5' 11"  (1.803 m)   Wt 196 lb 12.8 oz (89.3 kg)   SpO2 96%  BMI 27.45 kg/m  .  BMI Body mass index is 27.45 kg/m.  Wt Readings from Last 3 Encounters:  11/28/19 196 lb 12.8 oz (89.3 kg)  05/14/19 183 lb (83 kg)  09/07/18 175 lb (79.4 kg)    General: Pleasant. Looks younger than his stated age. He is alert and in no acute distress. His weight is up.   Cardiac: Regular rate and rhythm. Soft outflow murmur.  No significant edema.  Respiratory:  Lungs are fairly clear to auscultation bilaterally with normal work of breathing.  GI: Soft and nontender.  MS: No deformity or atrophy. Gait and ROM intact. He is using a walker.  Skin: Warm and dry. Color is normal.  Neuro:  Strength and sensation are intact and no gross focal deficits noted.  Psych: Alert, appropriate and with normal affect.   LABORATORY DATA:  EKG:  EKG is not ordered today.    Lab Results  Component Value Date   WBC 6.2 09/07/2018   HGB 12.2 (L) 09/07/2018   HCT 36.0 (L) 09/07/2018   PLT 143 (L) 09/07/2018   GLUCOSE 120 (H) 09/07/2018   ALT 11 09/07/2018   AST 20 09/07/2018   NA 138 09/07/2018   K 3.6 09/07/2018   CL 103 09/07/2018   CREATININE 1.00 09/07/2018   BUN 27 (H) 09/07/2018   CO2  23 09/07/2018   INR 1.11 09/07/2018   HGBA1C 5.4 04/07/2018        BNP (last 3 results) No results for input(s): BNP in the last 8760 hours.  ProBNP (last 3 results) No results for input(s): PROBNP in the last 8760 hours.   Other Studies Reviewed Today:  ECHO IMPRESSIONS 05/2019  1. The left ventricle has normal systolic function with an ejection  fraction of 60-65%. The cavity size was normal. There is moderate  asymmetric left ventricular hypertrophy of the septal wall. Left  ventricular diastolic Doppler parameters are  indeterminate. There is abnormal septal motion consistent with RV  pacemaker. No evidence of left ventricular regional wall motion  abnormalities.  2. The right ventricle has moderately reduced systolic function. The  cavity was normal. There is no increase in right ventricular wall  thickness. Right ventricular systolic pressure is normal.  3. Left atrial size was mildly dilated.  4. There is mild to moderate mitral annular calcification present.  5. A 26 an Edwards Edwards Sapien bioprosthetic aortic valve (TAVR) valve  is present in the aortic position. Procedure Date: 04/18/2018 Echo findings  are consistent with perivalvular leak of the aortic prosthesis.  6. Aortic valve regurgitation mild perivalvular.  7. The aorta is normal unless otherwise noted.  8. The aortic root and ascending aorta are normal in size and structure.   SUMMARY    S/P Redo TAVR 04/18/2018 with 26 mm Edwards Sapien 3 valve via  transapical approach. Trivial to mild perivalvular leak. Gradients  stable to improved from prior study (which was 1 mos post TAVR).  Study otherwise unchanged from prior    RIGHT/LEFT HEART CATH AND CORONARY/GRAFT ANGIOGRAPHY 03/2018  Conclusion    Mid LM to Ost LAD lesion is 100% stenosed.  Ost Cx to Prox Cx lesion is 100% stenosed.  LIMA graft was visualized by angiography and is normal in caliber.  SVG graft was visualized by  angiography and is normal in caliber.  SVG graft was visualized by angiography and is normal in caliber.  Prox RCA lesion is 50% stenosed.  Dist RCA lesion is 100% stenosed.  SVG  graft was visualized by angiography and is normal in caliber.  Hemodynamic findings consistent with mild pulmonary hypertension.   1. Severe triple vessel CAD s/p 6VCABG with 6 patent grafts.  2. Chronic occlusion proximal LAD and proximal Circumflex, mid to distal RCA 3. Severe aortic stenosis by echo. Unable to advance catheters into the LV due to tortuosity in the left subclavian artery. Coronary angiography was complete and I did not think it would be worthwhile to gain femoral artery access just to cross his valve. No LV pressures or aortic valve data obtained today by cath.   Recommendations: Will continue workup for TAVR.      Assessment/Plan:  1. CAD - remote CABG - doing well clinically. Little shortness of breath that is probably more related to less activity and weight gain - they will monitor. He says this is improving.   2. Prior TAVR for severe AS - stable echo from September noted.   3. HTN - BP 160/80 by me - he will monitor daily. For now, no change in his medicines.   4. HLD - on statin - labs from December noted.   5. PAD - not discussed.   6. CHB - has underlying PPM - sees Dr. Lovena Le - had remote check yesterday. That result is pending review.   7. PAF - in sinus by exam - remains on Eliquis.   8. Chronic anticoagulation - no problems noted. Recent labs from December from [facility] noted.   9. Prior lung nodules - noted on his work up for TAVR - no further work up felt to be needed.   10. Carotid artery disease:pre TAVR dopplers showed 80-99% RICA stenosis where he previously had R CEA. Dr. Margarita Mail. Not discussed today.   53. COVID-19 Education: The signs and symptoms of COVID-19 were discussed with the patient and how to seek care for testing (follow up with PCP or  arrange E-visit).  The importance of social distancing, staying at home, hand hygiene and wearing a mask when out in public were discussed today. He has had both vaccines.   Current medicines are reviewed with the patient today.  The patient does not have concerns regarding medicines other than what has been noted above.  The following changes have been made:  See above.  Labs/ tests ordered today include:   No orders of the defined types were placed in this encounter.    Disposition:   FU with [place] in 2 to 3 months.    Patient is agreeable to this plan and will call if any problems develop in the interim.   SignedTruitt Merle, NP  11/28/2019 12:15 PM  [place] [address] [place], [place]  [address] Phone: [phone] Fax: [phone]

## 2019-11-27 ENCOUNTER — Ambulatory Visit (INDEPENDENT_AMBULATORY_CARE_PROVIDER_SITE_OTHER): Payer: Medicare Other | Admitting: *Deleted

## 2019-11-27 DIAGNOSIS — Z950 Presence of cardiac pacemaker: Secondary | ICD-10-CM

## 2019-11-27 LAB — CUP PACEART REMOTE DEVICE CHECK
Battery Impedance: 1592 Ohm
Battery Remaining Longevity: 40 mo
Battery Voltage: 2.77 V
Brady Statistic AP VP Percent: 67 %
Brady Statistic AP VS Percent: 1 %
Brady Statistic AS VP Percent: 30 %
Brady Statistic AS VS Percent: 2 %
Date Time Interrogation Session: 20210330164604
Implantable Lead Implant Date: 20110725
Implantable Lead Implant Date: 20110725
Implantable Lead Location: 753859
Implantable Lead Location: 753860
Implantable Lead Model: 5076
Implantable Lead Model: 5076
Implantable Pulse Generator Implant Date: 20110725
Lead Channel Impedance Value: 462 Ohm
Lead Channel Impedance Value: 559 Ohm
Lead Channel Pacing Threshold Amplitude: 0.375 V
Lead Channel Pacing Threshold Amplitude: 0.625 V
Lead Channel Pacing Threshold Pulse Width: 0.4 ms
Lead Channel Pacing Threshold Pulse Width: 0.4 ms
Lead Channel Setting Pacing Amplitude: 2 V
Lead Channel Setting Pacing Amplitude: 2.5 V
Lead Channel Setting Pacing Pulse Width: 0.4 ms
Lead Channel Setting Sensing Sensitivity: 2.8 mV

## 2019-11-27 NOTE — Progress Notes (Signed)
PPM remote 

## 2019-11-28 ENCOUNTER — Encounter: Payer: Self-pay | Admitting: Nurse Practitioner

## 2019-11-28 ENCOUNTER — Ambulatory Visit (INDEPENDENT_AMBULATORY_CARE_PROVIDER_SITE_OTHER): Payer: Medicare Other | Admitting: Nurse Practitioner

## 2019-11-28 ENCOUNTER — Other Ambulatory Visit: Payer: Self-pay

## 2019-11-28 VITALS — BP 160/86 | HR 68 | Ht 71.0 in | Wt 196.8 lb

## 2019-11-28 DIAGNOSIS — Z952 Presence of prosthetic heart valve: Secondary | ICD-10-CM | POA: Diagnosis not present

## 2019-11-28 DIAGNOSIS — I5032 Chronic diastolic (congestive) heart failure: Secondary | ICD-10-CM | POA: Diagnosis not present

## 2019-11-28 DIAGNOSIS — I480 Paroxysmal atrial fibrillation: Secondary | ICD-10-CM

## 2019-11-28 DIAGNOSIS — Z7189 Other specified counseling: Secondary | ICD-10-CM | POA: Diagnosis not present

## 2019-11-28 DIAGNOSIS — I10 Essential (primary) hypertension: Secondary | ICD-10-CM

## 2019-11-28 DIAGNOSIS — Z950 Presence of cardiac pacemaker: Secondary | ICD-10-CM | POA: Diagnosis not present

## 2019-11-28 NOTE — Patient Instructions (Addendum)
After Visit Summary:  We will be checking the following labs today - NONE   Medication Instructions:    Continue with your current medicines.    If you need a refill on your cardiac medications before your next appointment, please call your pharmacy.     Testing/Procedures To Be Arranged:  N/A  Follow-Up:   See Dr. Marlou Porch or me in about 2 to 3 months.     At [facility], you and your health needs are our priority.  As part of our continuing mission to provide you with exceptional heart care, we have created designated Provider Care Teams.  These Care Teams include your primary Cardiologist (physician) and Advanced Practice Providers (APPs -  Physician Assistants and Nurse Practitioners) who all work together to provide you with the care you need, when you need it.  Special Instructions:  . Stay safe, stay home, wash your hands for at least 20 seconds and wear a mask when out in public.  . It was good to talk with you today.  . Check your blood pressure daily and keep a record - if it is staying above 160 for the top number - let us know.  . Try to move around more as best you can - this will help.  . Let us know if you feel your shortness of breath is not improving.    Call the [place] office at [phone] if you have any questions, problems or concerns.

## 2019-12-06 ENCOUNTER — Other Ambulatory Visit: Payer: Self-pay | Admitting: *Deleted

## 2019-12-06 ENCOUNTER — Telehealth: Payer: Self-pay

## 2019-12-06 NOTE — Patient Outreach (Signed)
[facility]) Care Management  [place]  12/06/2019   Dalton Townsend Nov 13, 1922 [id number]  RN Health Coach telephone call to patient.  [employer] compliance verified. Per patient he is weighing weekly. Patient stated that he doesn't not see the need to weigh daily. Patient is monitoring his sodium intake. Patient is doing a home routine of exercises and has not been able to return to the [facility]. He was unaware of virtual classes on line. Patient has received the educational information sent by RN Health Coach. Patient is taking medications as prescribed. He has received his COVID vaccine. Patient has agreed to further outreach calls.   Encounter Medications:  Outpatient Encounter Medications as of 12/06/2019  Medication Sig  . acetaminophen (TYLENOL) 500 MG tablet Take 1,000 mg by mouth at bedtime.  [name] amLODipine (NORVASC) 5 MG tablet Take 5 mg by mouth daily.  [name] amoxicillin (AMOXIL) 500 MG capsule Take 500 mg by mouth 4 (four) times daily as needed. Patient reports taking 4 capsules before any dental procedure.  [name] aspirin 81 MG chewable tablet Chew 1 tablet (81 mg total) by mouth daily.  [name] atorvastatin (LIPITOR) 20 MG tablet Take 20 mg by mouth every other day.   . balsalazide (COLAZAL) 750 MG capsule Take 2,250 mg by mouth 3 (three) times daily.  [name] ELIQUIS 5 MG TABS tablet TAKE 1 TABLET TWICE A DAY  . escitalopram (LEXAPRO) 10 MG tablet Take 10 mg by mouth daily.    . finasteride (PROSCAR) 5 MG tablet Take 5 mg by mouth See admin instructions. Take one tablet (5 mg) by mouth every day except Wednesdays  . furosemide (LASIX) 40 MG tablet Take 1 tablet (40 mg total) by mouth daily.  [name] latanoprost (XALATAN) 0.005 % ophthalmic solution Place 1 drop into both eyes at bedtime.   [name] losartan (COZAAR) 100 MG tablet Take 1 tablet (100 mg total) by mouth daily.  . mesalamine (CANASA) 1000 MG suppository Place 1,000 mg rectally at bedtime as needed (ulcerative colitis).  .  metoprolol succinate (TOPROL-XL) 50 MG 24 hr tablet Take 50 mg by mouth daily. Take with or immediately following a meal.  . Multiple Vitamin (MULTIVITAMIN WITH MINERALS) TABS tablet Take 1 tablet by mouth daily.   No facility-administered encounter medications on file as of 12/06/2019.    Functional Status:  In your present state of health, do you have any difficulty performing the following activities: 09/07/2019  Hearing? N  Vision? N  Difficulty concentrating or making decisions? N  Walking or climbing stairs? Y  Comment uses a walker and has some swelling in left leg  Dressing or bathing? N  Doing errands, shopping? Y  Preparing Food and eating ? Y  Comment wife assists  Using the Toilet? N  In the past six months, have you accidently leaked urine? N  Comment followed by urologist and on medications  Do you have problems with loss of bowel control? N  Comment followed by gastroenterologist  Managing your Medications? N  Managing your Finances? N  Housekeeping or managing your Housekeeping? Y  Some recent data might be hidden    Fall/Depression Screening: Fall Risk  12/06/2019 09/07/2019  Falls in the past year? 1 1  Number falls in past yr: 0 0  Injury with Fall? 1 1  Risk for fall due to : History of fall(s);Impaired balance/gait;Impaired mobility History of fall(s);Impaired balance/gait;Impaired mobility  Follow up Falls evaluation completed Falls evaluation completed;Falls prevention discussed   PHQ 2/9  Scores 12/06/2019  PHQ - 2 Score 0   THN CM Care Plan Problem One     Most Recent Value  Care Plan Problem One  Knowledge Deficit in Self management of Congestive Heart Failure  Role Documenting the Problem One  [place] for Problem One  Active  THN Long Term Goal   Patient will not have any CHF exacerbation within the next 90 days  Interventions for Problem One Long Term Goal  RN reiterates taking medications as prescribed. RN reiterates CHF zones and action  plan. RN will follow up for further reinforcement  THN CM Short Term Goal #1   Patient will verbalize weighing and document weights within the next 30 days  Interventions for Short Term Goal #1  RN reiterated with the patient about the importance of weighing. Patient has received his 2021 Calendar and educational material. RN will follow up with further discussion  THN CM Short Term Goal #2   Patient will verbalize having a better understanding of foods high and low in sodium within the next 30 days  THN CM Short Term Goal #2 Met Date  12/06/19  [facility] CM Short Term Goal #3  Patient will verbalize understanding the signs and symptoms of Stroke and heart attack within the next 90 days  THN CM Short Term Goal #3 Start Date  12/06/19  Interventions for Short Tern Goal #3  RN discussed the symptoms of heart attack and stroke. RN discussed the The acronym of FAST. RN sent the patient educational material. RN will follow up for further discussion.      Assessment:  Received his COVID vaccine Weighs weekly Adheres to diet Wears compression hose Uses walker Does balancing and strengthening exercises  Plan:  RN discussed CHF zones and action plan RN reiterated weighing RN discussed Heart Attack symptoms RN sent education material to reinforce heart attack symptoms RN discussed Stroke symptoms RN sent educational material to reinforce stroke symptoms and FAST acronym RN discussed exercise routine and [employer] virtual classes  [place] [facility] [phone]

## 2019-12-06 NOTE — Telephone Encounter (Signed)
Outreach made to Pt.  Advised Pt that he had been in afib since 11/14/19.  Advised Dr. Lovena Le wanted to check with Pt to see if he was having any symptoms.  Pt states he recently saw LG and thinks he is doing ok.  She advised him to lose some weight and be more active.  He is actively working on this and doesn't want to make any med changes at this time.  Agreed with Pt plan.  No action needed.

## 2019-12-06 NOTE — Telephone Encounter (Signed)
-----   Message from Evans Lance, MD sent at 11/27/2019  9:24 PM EDT ----- Remote device check reviewed. Histograms appropriate. Leads and battery stable for patient. Follow up as outlined above. No recommended changes. Call and see if symptomatic in atrial fib. If not watchful waiting.

## 2019-12-13 ENCOUNTER — Other Ambulatory Visit: Payer: Self-pay | Admitting: Cardiology

## 2019-12-14 NOTE — Telephone Encounter (Signed)
Prescription refill request for Eliquis received.  Last office visit: Servando Snare 11/28/2019 Scr: 1.04 08/16/2019 via [facility] Age:  86 y.o. Weight: 89.3 kg   Prescription refill sent.

## 2019-12-28 ENCOUNTER — Ambulatory Visit (INDEPENDENT_AMBULATORY_CARE_PROVIDER_SITE_OTHER): Payer: Medicare Other | Admitting: Podiatry

## 2019-12-28 ENCOUNTER — Encounter: Payer: Self-pay | Admitting: Podiatry

## 2019-12-28 ENCOUNTER — Other Ambulatory Visit: Payer: Self-pay

## 2019-12-28 DIAGNOSIS — Z9229 Personal history of other drug therapy: Secondary | ICD-10-CM

## 2019-12-28 DIAGNOSIS — M79674 Pain in right toe(s): Secondary | ICD-10-CM | POA: Diagnosis not present

## 2019-12-28 DIAGNOSIS — M79675 Pain in left toe(s): Secondary | ICD-10-CM

## 2019-12-28 DIAGNOSIS — B351 Tinea unguium: Secondary | ICD-10-CM

## 2019-12-28 DIAGNOSIS — Q828 Other specified congenital malformations of skin: Secondary | ICD-10-CM

## 2019-12-28 DIAGNOSIS — L84 Corns and callosities: Secondary | ICD-10-CM

## 2019-12-28 NOTE — Patient Instructions (Signed)
Corns and Calluses Corns are small areas of thickened skin that occur on the top, sides, or tip of a toe. They contain a cone-shaped core with a point that can press on a nerve below. This causes pain.  Calluses are areas of thickened skin that can occur anywhere on the body, including the hands, fingers, palms, soles of the feet, and heels. Calluses are usually larger than corns. What are the causes? Corns and calluses are caused by rubbing (friction) or pressure, such as from shoes that are too tight or do not fit properly. What increases the risk? Corns are more likely to develop in people who have misshapen toes (toe deformities), such as hammer toes. Calluses can occur with friction to any area of the skin. They are more likely to develop in people who:  Work with their hands.  Wear shoes that fit poorly, are too tight, or are high-heeled.  Have toe deformities. What are the signs or symptoms? Symptoms of a corn or callus include:  A hard growth on the skin.  Pain or tenderness under the skin.  Redness and swelling.  Increased discomfort while wearing tight-fitting shoes, if your feet are affected. If a corn or callus becomes infected, symptoms may include:  Redness and swelling that gets worse.  Pain.  Fluid, blood, or pus draining from the corn or callus. How is this diagnosed? Corns and calluses may be diagnosed based on your symptoms, your medical history, and a physical exam. How is this treated? Treatment for corns and calluses may include:  Removing the cause of the friction or pressure. This may involve: ? Changing your shoes. ? Wearing shoe inserts (orthotics) or other protective layers in your shoes, such as a corn pad. ? Wearing gloves.  Applying medicine to the skin (topical medicine) to help soften skin in the hardened, thickened areas.  Removing layers of dead skin with a file to reduce the size of the corn or callus.  Removing the corn or callus with a  scalpel or laser.  Taking antibiotic medicines, if your corn or callus is infected.  Having surgery, if a toe deformity is the cause. Follow these instructions at home:   Take over-the-counter and prescription medicines only as told by your health care provider.  If you were prescribed an antibiotic, take it as told by your health care provider. Do not stop taking it even if your condition starts to improve.  Wear shoes that fit well. Avoid wearing high-heeled shoes and shoes that are too tight or too loose.  Wear any padding, protective layers, gloves, or orthotics as told by your health care provider.  Soak your hands or feet and then use a file or pumice stone to soften your corn or callus. Do this as told by your health care provider.  Check your corn or callus every day for symptoms of infection. Contact a health care provider if you:  Notice that your symptoms do not improve with treatment.  Have redness or swelling that gets worse.  Notice that your corn or callus becomes painful.  Have fluid, blood, or pus coming from your corn or callus.  Have new symptoms. Summary  Corns are small areas of thickened skin that occur on the top, sides, or tip of a toe.  Calluses are areas of thickened skin that can occur anywhere on the body, including the hands, fingers, palms, and soles of the feet. Calluses are usually larger than corns.  Corns and calluses are caused by   rubbing (friction) or pressure, such as from shoes that are too tight or do not fit properly.  Treatment may include wearing any padding, protective layers, gloves, or orthotics as told by your health care provider. This information is not intended to replace advice given to you by your health care provider. Make sure you discuss any questions you have with your health care provider. Document Revised: 12/06/2018 Document Reviewed: 06/29/2017 Elsevier Patient Education  2020 [employer]  Onychomycosis/Fungal  Toenails  WHAT IS IT? An infection that lies within the keratin of your nail plate that is caused by a fungus.  WHY ME? Fungal infections affect all ages, sexes, races, and creeds.  There may be many factors that predispose you to a fungal infection such as age, coexisting medical conditions such as diabetes, or an autoimmune disease; stress, medications, fatigue, genetics, etc.  Bottom line: fungus thrives in a warm, moist environment and your shoes offer such a location.  IS IT CONTAGIOUS? Theoretically, yes.  You do not want to share shoes, nail clippers or files with someone who has fungal toenails.  Walking around barefoot in the same room or sleeping in the same bed is unlikely to transfer the organism.  It is important to realize, however, that fungus can spread easily from one nail to the next on the same foot.  HOW DO WE TREAT THIS?  There are several ways to treat this condition.  Treatment may depend on many factors such as age, medications, pregnancy, liver and kidney conditions, etc.  It is best to ask your doctor which options are available to you.  4. No treatment.   Unlike many other medical concerns, you can live with this condition.  However for many people this can be a painful condition and may lead to ingrown toenails or a bacterial infection.  It is recommended that you keep the nails cut short to help reduce the amount of fungal nail. 5. Topical treatment.  These range from herbal remedies to prescription strength nail lacquers.  About 40-50% effective, topicals require twice daily application for approximately 9 to 12 months or until an entirely new nail has grown out.  The most effective topicals are medical grade medications available through physicians offices. 6. Oral antifungal medications.  With an 80-90% cure rate, the most common oral medication requires 3 to 4 months of therapy and stays in your system for a year as the new nail grows out.  Oral antifungal medications do  require blood work to make sure it is a safe drug for you.  A liver function panel will be performed prior to starting the medication and after the first month of treatment.  It is important to have the blood work performed to avoid any harmful side effects.  In general, this medication safe but blood work is required. 7. Laser Therapy.  This treatment is performed by applying a specialized laser to the affected nail plate.  This therapy is noninvasive, fast, and non-painful.  It is not covered by insurance and is therefore, out of pocket.  The results have been very good with a 80-95% cure rate.  [facility] is the only practice in the area to offer this therapy. 8. Permanent Nail Avulsion.  Removing the entire nail so that a new nail will not grow back. 

## 2019-12-31 NOTE — Progress Notes (Signed)
Subjective: Dalton Townsend is a 86 y.o. male patient seen today corn(s) b/l feet and painful mycotic toenails b/l that are difficult to trim. Pain interferes with ambulation. Aggravating factors include wearing enclosed shoe gear. Pain is relieved with periodic professional debridement.  Patient Active Problem List   Diagnosis Date Noted  . 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 S/P femoral-femoral bypass surgery   . Aortic stenosis 04/11/2018  . Presence of permanent cardiac pacemaker   . PAF (paroxysmal atrial fibrillation) ([place])   . Carotid artery disease ([place])   . CAD (coronary artery disease)   . BPH (benign prostatic hyperplasia)   . GAD (generalized anxiety disorder)   . Acute on chronic congestive heart failure ([place]) 04/02/2018  . Severe aortic stenosis 01/04/2014  . HTN (hypertension) 06/24/2010  . Ulcerative colitis ([place]) 06/24/2010    Current Outpatient Medications on File Prior to Visit  Medication Sig Dispense Refill  . acetaminophen (TYLENOL) 500 MG tablet Take 1,000 mg by mouth at bedtime.    [name] amLODipine (NORVASC) 5 MG tablet Take 5 mg by mouth daily.    [name] amoxicillin (AMOXIL) 500 MG capsule Take 500 mg by mouth 4 (four) times daily as needed. Patient reports taking 4 capsules before any dental procedure.    [name] aspirin 81 MG chewable tablet Chew 1 tablet (81 mg total) by mouth daily.    [name] atorvastatin (LIPITOR) 20 MG tablet Take 20 mg by mouth every other day.     . balsalazide (COLAZAL) 750 MG capsule Take 2,250 mg by mouth 3 (three) times daily.    [name] ELIQUIS 5 MG TABS tablet TAKE 1 TABLET TWICE A DAY 180 tablet 1  . escitalopram (LEXAPRO) 10 MG tablet Take 10 mg by mouth daily.      . finasteride (PROSCAR) 5 MG tablet Take 5 mg by mouth See admin instructions. Take one tablet (5 mg) by mouth every day except Wednesdays    . furosemide (LASIX) 40 MG tablet Take 1 tablet (40 mg total) by mouth daily. 30 tablet 1  . latanoprost (XALATAN)  0.005 % ophthalmic solution Place 1 drop into both eyes at bedtime.     [name] losartan (COZAAR) 100 MG tablet Take 1 tablet (100 mg total) by mouth daily.  3  . mesalamine (CANASA) 1000 MG suppository Place 1,000 mg rectally at bedtime as needed (ulcerative colitis).    . metoprolol succinate (TOPROL-XL) 50 MG 24 hr tablet Take 50 mg by mouth daily. Take with or immediately following a meal.    . Multiple Vitamin (MULTIVITAMIN WITH MINERALS) TABS tablet Take 1 tablet by mouth daily.     No current facility-administered medications on file prior to visit.    Allergies  Allergen Reactions  . No Known Allergies     Objective: Physical Exam  General: Well developed, nourished, no acute distress, awake, alert and oriented x 3  Vascular:  Dorsalis pedis artery 2/4 bilateral. Posterior tibial artery 2/4 bilateral. Skin temperature gradient warm to warm proximal to distal bilateral lower extremities. No varicosities b/l. Pedal hair present bilateral.  Neurological: Gross sensation present via light touch bilateral.   Dermatological: Skin is warm, dry, and supple bilateral. Nails 2-5 b/l are tender, long, thick, and discolored with subungal debris. No interdigital macerations present bilateral. No open lesions present bilateral. Porokeratotic lesions distal tip left 3rd digit and right 2nd digit and plantar left heel with tenderness to palpation. No erythema, no edema, no drainage, no flocculence. Anonychia b/l  great toe(s) with evidence of permanent total nail avulsion. Nailbed(s) completely epithelialized and intact.  Musculoskeletal: No symptomatic bony deformities noted bilateral. Muscular strength within normal limits without pain on range of motion. No pain with calf compression bilateral. Hammertoes 2-5 b/l.  Assessment and Plan:  Painful mycotic toenails 2-5 b/l Porokeratosis left 3rd digit, left heel and right 2nd digit Pt on long term blood thinner  -Examined patient.  -No new  findings. No new orders. -Discussed treatment options for painful mycotic nails. -Mechanically debrided and reduced mycotic nails x 8 with sterile nail nipper and dremel nail file without incident. -Painful porokeratotic lesion(s) L heel, L 3rd toe and R 2nd toe pared and enucleated with sterile scalpel blade without incident. -Patient to continue soft, supportive shoe gear daily. -Patient to report any pedal injuries to medical professional immediately. -Patient/POA to call should there be question/concern in the interim.  Return in about 3 months (around 03/28/2020) for nail and callus trim.  Marzetta Board, DPM

## 2020-01-30 NOTE — Progress Notes (Signed)
CARDIOLOGY OFFICE NOTE  Date:  02/13/2020    Dalton Townsend Date of Birth: 1923-07-09 Medical Record #[id number]  PCP:  Lajean Manes, MD  Cardiologist:  Marisa Cyphers   Chief Complaint  Patient presents with  . Follow-up    History of Present Illness: Dalton Townsend is a 86 y.o. male who presents today for a follow up visit. Seen for Dr. Marlou Porch.   He has a history of CAD with remote CABG in 1999 - last cath in 03/2018 showing all grafts patent but with carotid disease noted of 99% on the R, severe AS - s/p TAVR in 2019 via trans apical approach, underlying PPM, PAF, PAD with emergent bypass following attempt at transfemoral TAVR.   Last seen in September by Dr. Marlou Porch - felt to be doing well. I saw him in March - overall felt to be ok. He had gotten more sedentary with the pandemic - some associated worsening of his dyspnea but felt like he was improving - they were to monitor.   The patient does not have symptoms concerning for COVID-19 infection (fever, chills, cough, or new shortness of breath).   Comes in today. Here with his family - daughter Dalton Townsend Warehouse manager) who I used to work with at [employer] many years ago. Has had increasing burden of AF noted on his device remote check from March. He remains on Eliquis - full dose and aspirin. He first says that his breathing is ok. Peggy - augments the history - she notes that he is now sleeping in the recliner due to getting more short of breath with lying down in the bed. Swelling has gotten worse. He thought he lost weight - he has gained. No chest pain. He has had a recent fall - fell down his basement steps - family had to come and help him get up. PT is going to come out and work with him on his balance - amazingly, he was not injured. BP diary from home looks ok. Still not really getting out - staying in. Has been vaccinated.   Past Medical History:  Diagnosis Date  . BPH (benign prostatic hyperplasia)   .  CAD (coronary artery disease)    a. s/p CABG x6V in 1999, cath 03/2018 showed patent 6/6 bypass grafts  . Carotid artery disease ([place])    a. s/p R CEA, 80-99% restenosis of RICA, to be address after TAVR  . Dyslipidemia   . Essential hypertension   . GAD (generalized anxiety disorder)   . Intestinal infection due to Clostridium difficile 03/2010  . PAF (paroxysmal atrial fibrillation) (H[facility])    a. on Eliquis  . Presence of permanent cardiac pacemaker   . PVD (peripheral vascular disease) ([place])   . S/P femoral-femoral bypass surgery    a. emergent fem fem bypass due to iliac complication during first attempt TAVR placement on 04/11/18 by Dr. Scot Dock  . S/P TAVR (transcatheter aortic valve replacement)    a. 04/18/18: s/p succesful TAVR with a [place] 3 THV  . Severe aortic stenosis   . Skin cancer   . Ulcerative colitis     Past Surgical History:  Procedure Laterality Date  . BALLOON AORTIC VALVE VALVULOPLASTY N/A 04/11/2018   Procedure: BALLOON AORTIC VALVE VALVULOPLASTY.  RIGHT EXTERNAL ILIAC ARTERY OVERSEW.;  Surgeon: Sherren Mocha, MD;  Location: [facility];  Service: Open Heart Surgery;  Laterality: N/A;  . CARDIAC CATHETERIZATION  1999  . CAROTID ENDARTERECTOMY Right 2004  .  CATARACT EXTRACTION, BILATERAL  2011  . CORONARY ARTERY BYPASS GRAFT  04/1998   3 vessel CAD  . ENDARTERECTOMY FEMORAL Right 04/11/2018   Procedure: ENDARTERECTOMY FEMORAL;  Surgeon: Sherren Mocha, MD;  Location: [facility];  Service: Open Heart Surgery;  Laterality: Right;  . FEMORAL-FEMORAL BYPASS GRAFT N/A 04/11/2018   Procedure: LEFT TO RIGHT BYPASS GRAFT FEMORAL-FEMORAL ARTERY;  Surgeon: Sherren Mocha, MD;  Location: [facility];  Service: Open Heart Surgery;  Laterality: N/A;  . INCISION AND DRAINAGE OF WOUND Left 05/04/2016   Procedure: SURGICAL PREP FOR GRAFTING LEFT LEG WITH THERA SKIN APPLICATION;  Surgeon: Irene Limbo, MD;  Location: [facility];  Service: Plastics;  Laterality: Left;  . INTRAOPERATIVE  ARTERIOGRAM  06/2000  . INTRAOPERATIVE TRANSTHORACIC ECHOCARDIOGRAM  04/11/2018   Procedure: INTRAOPERATIVE TRANSTHORACIC ECHOCARDIOGRAM;  Surgeon: Sherren Mocha, MD;  [facility];  Service: Open Heart Surgery;;  . PACEMAKER INSERTION  03/25/2010  . RIGHT/LEFT HEART CATH AND CORONARY/GRAFT ANGIOGRAPHY N/A 04/05/2018   Procedure: RIGHT/LEFT HEART CATH AND CORONARY/GRAFT ANGIOGRAPHY;  Surgeon: Burnell Blanks, MD;  Location: [facility];  Service: Cardiovascular;  Laterality: N/A;  . TEE WITHOUT CARDIOVERSION N/A 04/18/2018   Procedure: TRANSESOPHAGEAL ECHOCARDIOGRAM (TEE);  Surgeon: Sherren Mocha, MD;  Location: [facility];  Service: Open Heart Surgery;  Laterality: N/A;  . TOTAL KNEE ARTHROPLASTY  07/16/09  . TRANSCATHETER AORTIC VALVE REPLACEMENT, TRANSAPICAL N/A 04/18/2018   Procedure: TRANSCATHETER AORTIC VALVE REPLACEMENT, TRANSAPICAL. 50m EDWARDS SAPIEN 3 TRANSCATHETER HEART VALVE.;  Surgeon: CSherren Mocha MD;  [place] [place]  Service: Open Heart Surgery;  Laterality: N/A;  . TRANSURETHRAL RESECTION OF PROSTATE  1998     Medications: Current Meds  Medication Sig  . acetaminophen (TYLENOL) 500 MG tablet Take 1,000 mg by mouth at bedtime.  .Marland KitchenamLODipine (NORVASC) 5 MG tablet Take 5 mg by mouth daily.  .Marland Kitchenamoxicillin (AMOXIL) 500 MG capsule Take 500 mg by mouth 4 (four) times daily as needed. Patient reports taking 4 capsules before any dental procedure.  .Marland Kitchenaspirin 81 MG chewable tablet Chew 1 tablet (81 mg total) by mouth daily.  .Marland Kitchenatorvastatin (LIPITOR) 20 MG tablet Take 20 mg by mouth every other day.   . balsalazide (COLAZAL) 750 MG capsule Take 2,250 mg by mouth 3 (three) times daily.  .Marland KitchenELIQUIS 5 MG TABS tablet TAKE 1 TABLET TWICE A DAY  . escitalopram (LEXAPRO) 10 MG tablet Take 10 mg by mouth daily.    . finasteride (PROSCAR) 5 MG tablet Take 5 mg by mouth See admin instructions. Take one tablet (5 mg) by mouth every day except Wednesdays  . furosemide (LASIX) 40  MG tablet Take 2 pills (80 mg) for 5 days and then a pill and a half thereafter (60 mg)  . latanoprost (XALATAN) 0.005 % ophthalmic solution Place 1 drop into both eyes at bedtime.   .Marland Kitchenlosartan (COZAAR) 100 MG tablet Take 1 tablet (100 mg total) by mouth daily.  . mesalamine (CANASA) 1000 MG suppository Place 1,000 mg rectally at bedtime as needed (ulcerative colitis).  . metoprolol succinate (TOPROL-XL) 50 MG 24 hr tablet Take 50 mg by mouth daily. Take with or immediately following a meal.  . Multiple Vitamin (MULTIVITAMIN WITH MINERALS) TABS tablet Take 1 tablet by mouth daily.  . [DISCONTINUED] furosemide (LASIX) 40 MG tablet Take 1 tablet (40 mg total) by mouth daily.     Allergies: Allergies  Allergen Reactions  . No Known Allergies     Social History: The patient  reports that he quit smoking about 67 years ago. His smoking use included cigarettes. He quit after 10.00 years of use. He has quit using smokeless tobacco. He reports that he does not drink alcohol and does not use drugs.   Family History: The patient's family history includes Cancer in an other family member; Congestive Heart Failure in his mother; Heart failure in an other family member; Leukemia in his father and another family member.   Review of Systems: Please see the history of present illness.   All other systems are reviewed and negative.   Physical Exam: VS:  BP 120/62   Pulse 66   Ht 5' 11"  (1.803 m)   Wt 204 lb 12.8 oz (92.9 kg)   SpO2 96%   BMI 28.56 kg/m  .  BMI Body mass index is 28.56 kg/m.  Wt Readings from Last 3 Encounters:  02/13/20 204 lb 12.8 oz (92.9 kg)  11/28/19 196 lb 12.8 oz (89.3 kg)  05/14/19 183 lb (83 kg)    General: Pleasant. Elderly. Alert and in no acute distress.   Cardiac: Regular rate and rhythm but paced - harsh outflow murmur. He has trace edema - worse in the right leg.  Respiratory:  Lungs are clear to auscultation bilaterally with normal work of breathing.  GI:  Soft and nontender.  MS: No deformity or atrophy. Gait and ROM intact.  Skin: Warm and dry. Color is normal.  Neuro:  Strength and sensation are intact and no gross focal deficits noted.  Psych: Alert, appropriate and with normal affect.   LABORATORY DATA:  EKG:  EKG is ordered today.  Personally reviewed by me. This demonstrates V pacing. Looks to have underlying AF to me.   Lab Results  Component Value Date   WBC 6.2 09/07/2018   HGB 12.2 (L) 09/07/2018   HCT 36.0 (L) 09/07/2018   PLT 143 (L) 09/07/2018   GLUCOSE 120 (H) 09/07/2018   ALT 11 09/07/2018   AST 20 09/07/2018   NA 138 09/07/2018   K 3.6 09/07/2018   CL 103 09/07/2018   CREATININE 1.00 09/07/2018   BUN 27 (H) 09/07/2018   CO2 23 09/07/2018   INR 1.11 09/07/2018   HGBA1C 5.4 04/07/2018       BNP (last 3 results) No results for input(s): BNP in the last 8760 hours.  ProBNP (last 3 results) No results for input(s): PROBNP in the last 8760 hours.   Other Studies Reviewed Today:  ECHO IMPRESSIONS 05/2019  1. The left ventricle has normal systolic function with an ejection  fraction of 60-65%. The cavity size was normal. There is moderate  asymmetric left ventricular hypertrophy of the septal wall. Left  ventricular diastolic Doppler parameters are  indeterminate. There is abnormal septal motion consistent with RV  pacemaker. No evidence of left ventricular regional wall motion  abnormalities.  2. The right ventricle has moderately reduced systolic function. The  cavity was normal. There is no increase in right ventricular wall  thickness. Right ventricular systolic pressure is normal.  3. Left atrial size was mildly dilated.  4. There is mild to moderate mitral annular calcification present.  5. A 26 an Edwards Edwards Sapien bioprosthetic aortic valve (TAVR) valve  is present in the aortic position. Procedure Date: 04/18/2018 Echo findings  are consistent with perivalvular leak of the aortic  prosthesis.  6. Aortic valve regurgitation mild perivalvular.  7. The aorta is normal unless otherwise noted.  8. The aortic root and ascending aorta  are normal in size and structure.   SUMMARY    S/P Redo TAVR 04/18/2018 with 26 mm Edwards Sapien 3 valve via  transapical approach. Trivial to mild perivalvular leak. Gradients  stable to improved from prior study (which was 1 mos post TAVR).  Study otherwise unchanged from prior    RIGHT/LEFT HEART CATH AND CORONARY/GRAFT ANGIOGRAPHY 03/2018  Conclusion    Mid LM to Ost LAD lesion is 100% stenosed.  Ost Cx to Prox Cx lesion is 100% stenosed.  LIMA graft was visualized by angiography and is normal in caliber.  SVG graft was visualized by angiography and is normal in caliber.  SVG graft was visualized by angiography and is normal in caliber.  Prox RCA lesion is 50% stenosed.  Dist RCA lesion is 100% stenosed.  SVG graft was visualized by angiography and is normal in caliber.  Hemodynamic findings consistent with mild pulmonary hypertension.  1. Severe triple vessel CAD s/p 6VCABG with 6 patent grafts.  2. Chronic occlusion proximal LAD and proximal Circumflex, mid to distal RCA 3. Severe aortic stenosis by echo. Unable to advance catheters into the LV due to tortuosity in the left subclavian artery. Coronary angiography was complete and I did not think it would be worthwhile to gain femoral artery access just to cross his valve. No LV pressures or aortic valve data obtained today by cath.   Recommendations: Will continue workup for TAVR.      Assessment/Plan:  1. Acute on chronic diastolic HF - suspect this correlates to more AF that has been noted - weight is up. Will increase his diuretics to 80 mg for 5 days and then to 60 mg as a new maintenance dose. Lab today.   2. Persistent AF - noted increasing burden on his last device check - for repeat check later this month - on Toprol and anticoagulated - see  above - I suspect this may turn into his chronic rhythm.   3. CAD - remote CABG - no active chest pain noted.   4. Prior TAVR for AS - last echo noted.   5. HTN - BP ok here and at home. No changes made today.   6. HLD - on statin  7. PAD  8. CHB - has underlying PPM - followed remotely - need to get him back to see Dr. Lovena Le this summer - will address on return.   9. Chronic anticoagulation - recent fall - lab today. Fortunately, he did not get hurt.   10. Carotid artery disease:pre TAVR dopplers showed 80-99% RICA stenosis where he previously had R CEA. Dr. Margarita Mail.Not discussed today.    Current medicines are reviewed with the patient today.  The patient does not have concerns regarding medicines other than what has been noted above.  The following changes have been made:  See above.  Labs/ tests ordered today include:    Orders Placed This Encounter  Procedures  . Basic metabolic panel  . CBC  . Hepatic function panel  . EKG 12-Lead     Disposition:   FU with me in about a month. Lasix is increased today. Lab today. Will see what the next remote shows.    Patient is agreeable to this plan and will call if any problems develop in the interim.   SignedTruitt Merle, NP  02/13/2020 12:34 PM  [place] [address] [place],   [address] Phone: [phone] Fax: [phone]

## 2020-02-13 ENCOUNTER — Ambulatory Visit (INDEPENDENT_AMBULATORY_CARE_PROVIDER_SITE_OTHER): Payer: Medicare Other | Admitting: Nurse Practitioner

## 2020-02-13 ENCOUNTER — Encounter: Payer: Self-pay | Admitting: Nurse Practitioner

## 2020-02-13 ENCOUNTER — Other Ambulatory Visit: Payer: Self-pay

## 2020-02-13 VITALS — BP 120/62 | HR 66 | Ht 71.0 in | Wt 204.8 lb

## 2020-02-13 DIAGNOSIS — I5033 Acute on chronic diastolic (congestive) heart failure: Secondary | ICD-10-CM

## 2020-02-13 DIAGNOSIS — Z950 Presence of cardiac pacemaker: Secondary | ICD-10-CM

## 2020-02-13 DIAGNOSIS — I480 Paroxysmal atrial fibrillation: Secondary | ICD-10-CM | POA: Diagnosis not present

## 2020-02-13 LAB — BASIC METABOLIC PANEL
BUN/Creatinine Ratio: 22 (ref 10–24)
BUN: 28 mg/dL (ref 10–36)
CO2: 23 mmol/L (ref 20–29)
Calcium: 9.2 mg/dL (ref 8.6–10.2)
Chloride: 102 mmol/L (ref 96–106)
Creatinine, Ser: 1.25 mg/dL (ref 0.76–1.27)
GFR calc Af Amer: 57 mL/min/{1.73_m2} — ABNORMAL LOW (ref >59)
GFR calc non Af Amer: 49 mL/min/{1.73_m2} — ABNORMAL LOW (ref >59)
Glucose: 99 mg/dL (ref 65–99)
Potassium: 4.2 mmol/L (ref 3.5–5.2)
Sodium: 141 mmol/L (ref 134–144)

## 2020-02-13 LAB — CBC
Hematocrit: 26.1 % — ABNORMAL LOW (ref 37.5–51.0)
Hemoglobin: 8.4 g/dL — ABNORMAL LOW (ref 13.0–17.7)
MCH: 30.5 pg (ref 26.6–33.0)
MCHC: 32.2 g/dL (ref 31.5–35.7)
MCV: 95 fL (ref 79–97)
Platelets: 176 10*3/uL (ref 150–450)
RBC: 2.75 x10E6/uL — ABNORMAL LOW (ref 4.14–5.80)
RDW: 13.7 % (ref 11.6–15.4)
WBC: 8.9 10*3/uL (ref 3.4–10.8)

## 2020-02-13 LAB — HEPATIC FUNCTION PANEL
ALT: 6 IU/L (ref 0–44)
AST: 18 IU/L (ref 0–40)
Albumin: 3.9 g/dL (ref 3.5–4.6)
Alkaline Phosphatase: 40 IU/L — ABNORMAL LOW (ref 48–121)
Bilirubin Total: 1.1 mg/dL (ref 0.0–1.2)
Bilirubin, Direct: 0.32 mg/dL (ref 0.00–0.40)
Total Protein: 5.8 g/dL — ABNORMAL LOW (ref 6.0–8.5)

## 2020-02-13 MED ORDER — FUROSEMIDE 40 MG PO TABS: ORAL_TABLET | ORAL | 1 refills | Status: DC

## 2020-02-13 NOTE — Patient Instructions (Addendum)
After Visit Summary:  We will be checking the following labs today - BMET, HPF and CBC   Medication Instructions:    Continue with your current medicines. BUT  I am increasing the Lasix to 80 mg (2 pills) for 5 days - then down to 60 mg (1 1/2 pills) each day   If you need a refill on your cardiac medications before your next appointment, please call your pharmacy.     Testing/Procedures To Be Arranged:  N/A  Follow-Up:   See me in about a month    At [facility], you and your health needs are our priority.  As part of our continuing mission to provide you with exceptional heart care, we have created designated Provider Care Teams.  These Care Teams include your primary Cardiologist (physician) and Advanced Practice Providers (APPs -  Physician Assistants and Nurse Practitioners) who all work together to provide you with the care you need, when you need it.  Special Instructions:  . Stay safe, wash your hands for at least 20 seconds and wear a mask when needed.  . It was good to talk with you today.    Call the [place] office at [phone] if you have any questions, problems or concerns.

## 2020-02-26 ENCOUNTER — Ambulatory Visit (INDEPENDENT_AMBULATORY_CARE_PROVIDER_SITE_OTHER): Payer: Medicare Other | Admitting: *Deleted

## 2020-02-26 DIAGNOSIS — Z950 Presence of cardiac pacemaker: Secondary | ICD-10-CM | POA: Diagnosis not present

## 2020-02-26 LAB — CUP PACEART REMOTE DEVICE CHECK
Battery Impedance: 1650 Ohm
Battery Remaining Longevity: 38 mo
Battery Voltage: 2.76 V
Brady Statistic AP VP Percent: 67 %
Brady Statistic AP VS Percent: 1 %
Brady Statistic AS VP Percent: 30 %
Brady Statistic AS VS Percent: 2 %
Date Time Interrogation Session: 20210629135632
Implantable Lead Implant Date: 20110725
Implantable Lead Implant Date: 20110725
Implantable Lead Location: 753859
Implantable Lead Location: 753860
Implantable Lead Model: 5076
Implantable Lead Model: 5076
Implantable Pulse Generator Implant Date: 20110725
Lead Channel Impedance Value: 462 Ohm
Lead Channel Impedance Value: 530 Ohm
Lead Channel Pacing Threshold Amplitude: 0.375 V
Lead Channel Pacing Threshold Amplitude: 0.75 V
Lead Channel Pacing Threshold Pulse Width: 0.4 ms
Lead Channel Pacing Threshold Pulse Width: 0.4 ms
Lead Channel Setting Pacing Amplitude: 2 V
Lead Channel Setting Pacing Amplitude: 2.5 V
Lead Channel Setting Pacing Pulse Width: 0.4 ms
Lead Channel Setting Sensing Sensitivity: 2 mV

## 2020-02-27 NOTE — Progress Notes (Signed)
Remote pacemaker transmission.   

## 2020-03-06 ENCOUNTER — Other Ambulatory Visit: Payer: Self-pay | Admitting: *Deleted

## 2020-03-06 NOTE — Patient Outreach (Signed)
[facility]) Care Management  03/06/2020  Dalton Townsend 10/18/1922 [id number]  RN Health Coach attempted follow up outreach call to patient.  Patient was unavailable. Per person answering stated that he is busy now and could he be called back later. Plan: RN will call patient again within 30 days.  [facility] Care Management [phone]

## 2020-03-17 DIAGNOSIS — Z9181 History of falling: Secondary | ICD-10-CM | POA: Diagnosis not present

## 2020-03-17 DIAGNOSIS — M6281 Muscle weakness (generalized): Secondary | ICD-10-CM | POA: Diagnosis not present

## 2020-03-17 DIAGNOSIS — R2681 Unsteadiness on feet: Secondary | ICD-10-CM | POA: Diagnosis not present

## 2020-03-17 DIAGNOSIS — R262 Difficulty in walking, not elsewhere classified: Secondary | ICD-10-CM | POA: Diagnosis not present

## 2020-03-18 NOTE — Progress Notes (Signed)
CARDIOLOGY OFFICE NOTE  Date:  03/25/2020    Dalton Townsend Date of Birth: [birth year] Medical Record #[id number]  PCP:  Dalton Manes, MD  Cardiologist:  Dalton Townsend   Chief Complaint  Patient presents with  . Follow-up    History of Present Illness: Dalton Townsend is a 86 y.o. male who presents today for a 6 week check. Seen for Dr. Marlou Townsend.   He has a history of CAD with remote CABG in 1999 - last cath in 03/2018 showing all grafts patent but with carotid disease noted of 99% on the R, severe AS - s/p TAVR in 2019 via trans apical approach, underlying PPM, PAF, PAD with emergent bypass following attempt at transfemoral TAVR.   Last seen in September by Dr. Marlou Townsend - felt to be doing well.I saw him in March - overall felt to be ok. He had gotten more sedentary with the pandemic - some associated worsening of his dyspnea but felt like he was improving - they were to monitor. Last seen about 6 weeks ago - his daughter Dalton Townsend is a Software engineer at [employer] that I used to work with. Increased AF burden noted. Swelling was worse. Had had recent fall. Very anemic as well.   Comes in today. Here with his daughter Dalton Townsend - he is doing better. Now on iron daily - was just once or twice a week. Remains on both aspirin and Eliquis. He is hopeful to stay on both. His swelling has improved significantly. Weight is down 9 pounds. Less shortness of breath. Seeing PCP in about 3 weeks.   Past Medical History:  Diagnosis Date  . Acute on chronic congestive heart failure ([place]) 04/02/2018  . Aortic stenosis 04/11/2018  . BPH (benign prostatic hyperplasia)   . CAD (coronary artery disease)    a. s/p CABG x6V in 1999, cath 03/2018 showed patent 6/6 bypass grafts  . Carotid artery disease ([place])    a. s/p R CEA, 80-99% restenosis of RICA, to be address after TAVR  . Dyslipidemia   . Essential hypertension   . GAD (generalized anxiety disorder)   . HTN (hypertension) 06/24/2010   Qualifier:  Diagnosis of  By: Johnnye Sima MD, Dellis Filbert    . Intestinal infection due to Clostridium difficile 03/2010  . PAF (paroxysmal atrial fibrillation) (HCC)    a. on Eliquis  . Presence of permanent cardiac pacemaker   . PVD (peripheral vascular disease) ([place])   . S/P femoral-femoral bypass surgery    a. emergent fem fem bypass due to iliac complication during first attempt TAVR placement on 04/11/18 by Dr. Scot Dock  . S/P TAVR (transcatheter aortic valve replacement)    a. 04/18/18: s/p succesful TAVR with a [place] 3 THV  . Severe aortic stenosis   . Skin cancer   . Ulcerative colitis   . Ulcerative colitis ([place]) 06/24/2010   Qualifier: Diagnosis of  By: Johnnye Sima MD, Dellis Filbert      Past Surgical History:  Procedure Laterality Date  . BALLOON AORTIC VALVE VALVULOPLASTY N/A 04/11/2018   Procedure: BALLOON AORTIC VALVE VALVULOPLASTY.  RIGHT EXTERNAL ILIAC ARTERY OVERSEW.;  Surgeon: Sherren Mocha, MD;  Location: [facility];  Service: Open Heart Surgery;  Laterality: N/A;  . CARDIAC CATHETERIZATION  1999  . CAROTID ENDARTERECTOMY Right 2004  . CATARACT EXTRACTION, BILATERAL  2011  . CORONARY ARTERY BYPASS GRAFT  04/1998   3 vessel CAD  . ENDARTERECTOMY FEMORAL Right 04/11/2018   Procedure: ENDARTERECTOMY FEMORAL;  Surgeon: Burt Knack,  Legrand Como, MD;  Location: [place] [facility];  Service: Open Heart Surgery;  Laterality: Right;  . FEMORAL-FEMORAL BYPASS GRAFT N/A 04/11/2018   Procedure: LEFT TO RIGHT BYPASS GRAFT FEMORAL-FEMORAL ARTERY;  Surgeon: Sherren Mocha, MD;  Location: [facility];  Service: Open Heart Surgery;  Laterality: N/A;  . INCISION AND DRAINAGE OF WOUND Left 05/04/2016   Procedure: SURGICAL PREP FOR GRAFTING LEFT LEG WITH THERA SKIN APPLICATION;  Surgeon: Irene Limbo, MD;  Location: [facility];  Service: Plastics;  Laterality: Left;  . INTRAOPERATIVE ARTERIOGRAM  06/2000  . INTRAOPERATIVE TRANSTHORACIC ECHOCARDIOGRAM  04/11/2018   Procedure: INTRAOPERATIVE TRANSTHORACIC ECHOCARDIOGRAM;  Surgeon: Sherren Mocha, MD;  [facility];  Service: Open Heart Surgery;;  . PACEMAKER INSERTION  03/25/2010  . RIGHT/LEFT HEART CATH AND CORONARY/GRAFT ANGIOGRAPHY N/A 04/05/2018   Procedure: RIGHT/LEFT HEART CATH AND CORONARY/GRAFT ANGIOGRAPHY;  Surgeon: Burnell Blanks, MD;  Location: [facility];  Service: Cardiovascular;  Laterality: N/A;  . TEE WITHOUT CARDIOVERSION N/A 04/18/2018   Procedure: TRANSESOPHAGEAL ECHOCARDIOGRAM (TEE);  Surgeon: Sherren Mocha, MD;  Location: [facility];  Service: Open Heart Surgery;  Laterality: N/A;  . TOTAL KNEE ARTHROPLASTY  07/16/09  . TRANSCATHETER AORTIC VALVE REPLACEMENT, TRANSAPICAL N/A 04/18/2018   Procedure: TRANSCATHETER AORTIC VALVE REPLACEMENT, TRANSAPICAL. 28m EDWARDS SAPIEN 3 TRANSCATHETER HEART VALVE.;  Surgeon: CSherren Mocha MD;  Location: [facility]  Service: Open Heart Surgery;  Laterality: N/A;  . TRANSURETHRAL RESECTION OF PROSTATE  1998     Medications: Current Meds  Medication Sig  . acetaminophen (TYLENOL) 500 MG tablet Take 1,000 mg by mouth at bedtime.  .Marland KitchenamLODipine (NORVASC) 5 MG tablet Take 5 mg by mouth daily.  .Marland Kitchenamoxicillin (AMOXIL) 500 MG capsule Take 500 mg by mouth 4 (four) times daily as needed. Patient reports taking 4 capsules before any dental procedure.  .Marland Kitchenaspirin 81 MG chewable tablet Chew 1 tablet (81 mg total) by mouth daily.  .Marland Kitchenatorvastatin (LIPITOR) 20 MG tablet Take 20 mg by mouth every other day.   . balsalazide (COLAZAL) 750 MG capsule Take 2,250 mg by mouth 3 (three) times daily.  .Marland KitchenELIQUIS 5 MG TABS tablet TAKE 1 TABLET TWICE A DAY  . escitalopram (LEXAPRO) 10 MG tablet Take 10 mg by mouth daily.    . ferrous sulfate 325 (65 FE) MG tablet Take 325 mg by mouth daily.  . finasteride (PROSCAR) 5 MG tablet Take 5 mg by mouth See admin instructions. Take one tablet (5 mg) by mouth every day except Wednesdays  . furosemide (LASIX) 40 MG tablet Take 2 pills (80 mg) for 5 days and then a pill and a half thereafter (60 mg)   . latanoprost (XALATAN) 0.005 % ophthalmic solution Place 1 drop into both eyes at bedtime.   .Marland Kitchenlosartan (COZAAR) 100 MG tablet Take 1 tablet (100 mg total) by mouth daily.  . mesalamine (CANASA) 1000 MG suppository Place 1,000 mg rectally at bedtime as needed (ulcerative colitis).  . metoprolol succinate (TOPROL-XL) 50 MG 24 hr tablet Take 50 mg by mouth daily. Take with or immediately following a meal.  . Multiple Vitamin (MULTIVITAMIN WITH MINERALS) TABS tablet Take 1 tablet by mouth daily.     Allergies: Allergies  Allergen Reactions  . No Known Allergies     Social History: The patient  reports that he quit smoking about 67 years ago. His smoking use included cigarettes. He quit after 10.00 years of use. He has quit using smokeless tobacco. He reports that he does not drink  alcohol and does not use drugs.   Family History: The patient's family history includes Cancer in an other family member; Congestive Heart Failure in his mother; Heart failure in an other family member; Leukemia in his father and another family member.   Review of Systems: Please see the history of present illness.   All other systems are reviewed and negative.   Physical Exam: VS:  BP (!) 150/72   Pulse 61   Ht 5' 11"  (1.803 m)   Wt 195 lb 9.6 oz (88.7 kg)   SpO2 96%   BMI 27.28 kg/m  .  BMI Body mass index is 27.28 kg/m.  Wt Readings from Last 3 Encounters:  03/25/20 195 lb 9.6 oz (88.7 kg)  02/13/20 204 lb 12.8 oz (92.9 kg)  11/28/19 196 lb 12.8 oz (89.3 kg)    General: Pleasant. Elderly. Alert and in no acute distress. Looks better today.    Cardiac: Fairly regular. Less edema. Respiratory:  Lungs are clear to auscultation bilaterally with normal work of breathing.  GI: Soft and nontender.  MS: No deformity or atrophy. Gait and ROM intact. Using a walker.  Skin: Warm and dry. Color is normal.   Neuro:  Strength and sensation are intact and no gross focal deficits noted.  Psych: Alert,  appropriate and with normal affect.   LABORATORY DATA:  EKG:  EKG is not ordered today.    Lab Results  Component Value Date   WBC 8.9 02/13/2020   HGB 8.4 (L) 02/13/2020   HCT 26.1 (L) 02/13/2020   PLT 176 02/13/2020   GLUCOSE 99 02/13/2020   ALT 6 02/13/2020   AST 18 02/13/2020   NA 141 02/13/2020   K 4.2 02/13/2020   CL 102 02/13/2020   CREATININE 1.25 02/13/2020   BUN 28 02/13/2020   CO2 23 02/13/2020   INR 1.11 09/07/2018   HGBA1C 5.4 04/07/2018       BNP (last 3 results) No results for input(s): BNP in the last 8760 hours.  ProBNP (last 3 results) No results for input(s): PROBNP in the last 8760 hours.   Other Studies Reviewed Today:  ECHOIMPRESSIONS9/2020  1. The left ventricle has normal systolic function with an ejection  fraction of 60-65%. The cavity size was normal. There is moderate  asymmetric left ventricular hypertrophy of the septal wall. Left  ventricular diastolic Doppler parameters are  indeterminate. There is abnormal septal motion consistent with RV  pacemaker. No evidence of left ventricular regional wall motion  abnormalities.  2. The right ventricle has moderately reduced systolic function. The  cavity was normal. There is no increase in right ventricular wall  thickness. Right ventricular systolic pressure is normal.  3. Left atrial size was mildly dilated.  4. There is mild to moderate mitral annular calcification present.  5. A 26 an Edwards Edwards Sapien bioprosthetic aortic valve (TAVR) valve  is present in the aortic position. Procedure Date: 04/18/2018 Echo findings  are consistent with perivalvular leak of the aortic prosthesis.  6. Aortic valve regurgitation mild perivalvular.  7. The aorta is normal unless otherwise noted.  8. The aortic root and ascending aorta are normal in size and structure.   SUMMARY    S/P Redo TAVR 04/18/2018 with 26 mm Edwards Sapien 3 valve via  transapical approach. Trivial to mild  perivalvular leak. Gradients  stable to improved from prior study (which was 1 mos post TAVR).  Study otherwise unchanged from prior    RIGHT/LEFT HEART CATH AND  CORONARY/GRAFT ANGIOGRAPHY8/2019  Conclusion    Mid LM to Ost LAD lesion is 100% stenosed.  Ost Cx to Prox Cx lesion is 100% stenosed.  LIMA graft was visualized by angiography and is normal in caliber.  SVG graft was visualized by angiography and is normal in caliber.  SVG graft was visualized by angiography and is normal in caliber.  Prox RCA lesion is 50% stenosed.  Dist RCA lesion is 100% stenosed.  SVG graft was visualized by angiography and is normal in caliber.  Hemodynamic findings consistent with mild pulmonary hypertension.  1. Severe triple vessel CAD s/p 6VCABG with 6 patent grafts.  2. Chronic occlusion proximal LAD and proximal Circumflex, mid to distal RCA 3. Severe aortic stenosis by echo. Unable to advance catheters into the LV due to tortuosity in the left subclavian artery. Coronary angiography was complete and I did not think it would be worthwhile to gain femoral artery access just to cross his valve. No LV pressures or aortic valve data obtained today by cath.   Recommendations: Will continue workup for TAVR.     Assessment/Plan:  1. Chronic diastolic HF with recent acute flare - suspect due to worsening anemia and increased burden of AF. Will keep on higher dose of Lasix. Lab today.   2. Persistent AF - remains on Toprol and anticoagulation - underlying PPM.   3. CAD - remote CABG - no active chest pain. Remains on aspirin. He is hopeful to continue.   4. Prior TAVR for AS - last echo noted.   5. Worsening anemia - unclear etiology -would not be inclined to pursue GI work up - recheck lab today - if no real improvement he is agreeable to stopping aspirin. Then may have to address the Eliquis. Lab is pending.   6. HTN - Bp fair - no changes made.   7. PAD   8. CHB - has  underlying PPM - seeing EP later this summer.   9. Chronic anticoagulation - repeating lab today - see above.   10. Carotid artery disease:pre TAVR dopplers showed 80-99% RICA stenosis where he previously had R CEA. Dr. Margarita Mail.Not discussed today.   Current medicines are reviewed with the patient today.  The patient does not have concerns regarding medicines other than what has been noted above.  The following changes have been made:  See above.  Labs/ tests ordered today include:    Orders Placed This Encounter  Procedures  . Basic metabolic panel  . CBC     Disposition:   FU with me in 3 months.   Patient is agreeable to this plan and will call if any problems develop in the interim.   SignedTruitt Merle, NP  03/25/2020 12:10 PM  [facility] [address] [place], [place]  [address] Phone: [phone] Fax: [phone]

## 2020-03-19 DIAGNOSIS — M6281 Muscle weakness (generalized): Secondary | ICD-10-CM | POA: Diagnosis not present

## 2020-03-19 DIAGNOSIS — R262 Difficulty in walking, not elsewhere classified: Secondary | ICD-10-CM | POA: Diagnosis not present

## 2020-03-19 DIAGNOSIS — R2681 Unsteadiness on feet: Secondary | ICD-10-CM | POA: Diagnosis not present

## 2020-03-19 DIAGNOSIS — Z9181 History of falling: Secondary | ICD-10-CM | POA: Diagnosis not present

## 2020-03-25 ENCOUNTER — Other Ambulatory Visit: Payer: Self-pay

## 2020-03-25 ENCOUNTER — Ambulatory Visit (INDEPENDENT_AMBULATORY_CARE_PROVIDER_SITE_OTHER): Payer: Medicare Other | Admitting: Nurse Practitioner

## 2020-03-25 ENCOUNTER — Encounter: Payer: Self-pay | Admitting: Nurse Practitioner

## 2020-03-25 VITALS — BP 150/72 | HR 61 | Ht 71.0 in | Wt 195.6 lb

## 2020-03-25 DIAGNOSIS — I10 Essential (primary) hypertension: Secondary | ICD-10-CM | POA: Diagnosis not present

## 2020-03-25 DIAGNOSIS — I5033 Acute on chronic diastolic (congestive) heart failure: Secondary | ICD-10-CM

## 2020-03-25 DIAGNOSIS — I480 Paroxysmal atrial fibrillation: Secondary | ICD-10-CM | POA: Diagnosis not present

## 2020-03-25 LAB — BASIC METABOLIC PANEL
BUN/Creatinine Ratio: 16 (ref 10–24)
BUN: 18 mg/dL (ref 10–36)
CO2: 22 mmol/L (ref 20–29)
Calcium: 9.6 mg/dL (ref 8.6–10.2)
Chloride: 105 mmol/L (ref 96–106)
Creatinine, Ser: 1.11 mg/dL (ref 0.76–1.27)
GFR calc Af Amer: 65 mL/min/{1.73_m2} (ref >59)
GFR calc non Af Amer: 57 mL/min/{1.73_m2} — ABNORMAL LOW (ref >59)
Glucose: 92 mg/dL (ref 65–99)
Potassium: 4.2 mmol/L (ref 3.5–5.2)
Sodium: 142 mmol/L (ref 134–144)

## 2020-03-25 LAB — CBC
Hematocrit: 33.8 % — ABNORMAL LOW (ref 37.5–51.0)
Hemoglobin: 10.9 g/dL — ABNORMAL LOW (ref 13.0–17.7)
MCH: 30.7 pg (ref 26.6–33.0)
MCHC: 32.2 g/dL (ref 31.5–35.7)
MCV: 95 fL (ref 79–97)
Platelets: 153 10*3/uL (ref 150–450)
RBC: 3.55 x10E6/uL — ABNORMAL LOW (ref 4.14–5.80)
RDW: 14.5 % (ref 11.6–15.4)
WBC: 6.7 10*3/uL (ref 3.4–10.8)

## 2020-03-25 NOTE — Patient Instructions (Signed)
After Visit Summary:  We will be checking the following labs today - BMET and CBC   Medication Instructions:    Continue with your current medicines.    If you need a refill on your cardiac medications before your next appointment, please call your pharmacy.     Testing/Procedures To Be Arranged:  N/A  Follow-Up:   See me in 3 months    At [facility], you and your health needs are our priority.  As part of our continuing mission to provide you with exceptional heart care, we have created designated Provider Care Teams.  These Care Teams include your primary Cardiologist (physician) and Advanced Practice Providers (APPs -  Physician Assistants and Nurse Practitioners) who all work together to provide you with the care you need, when you need it.  Special Instructions:  . Stay safe, wash your hands for at least 20 seconds and wear a mask when needed.  . It was good to talk with you today.    Call the [facility] office at [phone] if you have any questions, problems or concerns.

## 2020-03-28 ENCOUNTER — Encounter: Payer: Self-pay | Admitting: Podiatry

## 2020-03-28 ENCOUNTER — Ambulatory Visit (INDEPENDENT_AMBULATORY_CARE_PROVIDER_SITE_OTHER): Payer: Medicare Other | Admitting: Podiatry

## 2020-03-28 ENCOUNTER — Other Ambulatory Visit: Payer: Self-pay

## 2020-03-28 DIAGNOSIS — M79674 Pain in right toe(s): Secondary | ICD-10-CM | POA: Diagnosis not present

## 2020-03-28 DIAGNOSIS — M79675 Pain in left toe(s): Secondary | ICD-10-CM | POA: Diagnosis not present

## 2020-03-28 DIAGNOSIS — B351 Tinea unguium: Secondary | ICD-10-CM | POA: Diagnosis not present

## 2020-03-28 DIAGNOSIS — L84 Corns and callosities: Secondary | ICD-10-CM

## 2020-03-28 DIAGNOSIS — Q828 Other specified congenital malformations of skin: Secondary | ICD-10-CM | POA: Diagnosis not present

## 2020-03-28 DIAGNOSIS — L601 Onycholysis: Secondary | ICD-10-CM | POA: Diagnosis not present

## 2020-03-28 NOTE — Patient Instructions (Signed)
Mrs. Dalton Townsend,  Apply antibiotic cream to left 2nd toe nailbed once daily for about 2 weeks. He should get a new toenail growing in about 3-4 months.

## 2020-03-29 NOTE — Progress Notes (Signed)
Subjective: Dalton Townsend is a 86 y.o. male patient seen today corn(s) b/l feet and painful mycotic toenails b/l that are difficult to trim. Pain interferes with ambulation. Aggravating factors include wearing enclosed shoe gear. Pain is relieved with periodic professional debridement.   Dalton Townsend states some of Dalton corns have improved.  He is not having as much pain as he has had in the past.  Patient states he may have injured Dalton left second toe.  States the toenail has blood underneath it.  He does not remember injuring Dalton toenail.  Dalton Townsend is present during today's visit.  Townsend voices no other pedal concerns on today's visit.  Patient Active Problem List   Diagnosis Date Noted  . 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 S/P femoral-femoral bypass surgery   . Aortic stenosis 04/11/2018  . Presence of permanent cardiac pacemaker   . PAF (paroxysmal atrial fibrillation) ([place])   . Carotid artery disease ([place])   . CAD (coronary artery disease)   . BPH (benign prostatic hyperplasia)   . GAD (generalized anxiety disorder)   . Acute on chronic congestive heart failure ([place]) 04/02/2018  . Severe aortic stenosis 01/04/2014  . HTN (hypertension) 06/24/2010  . Ulcerative colitis ([place]) 06/24/2010    Current Outpatient Medications on File Prior to Visit  Medication Sig Dispense Refill  . acetaminophen (TYLENOL) 500 MG tablet Take 1,000 mg by mouth at bedtime.    [name] amLODipine (NORVASC) 5 MG tablet Take 5 mg by mouth daily.    [name] amoxicillin (AMOXIL) 500 MG capsule Take 500 mg by mouth 4 (four) times daily as needed. Patient reports taking 4 capsules before any dental procedure.    [name] aspirin 81 MG chewable tablet Chew 1 tablet (81 mg total) by mouth daily.    [name] atorvastatin (LIPITOR) 20 MG tablet Take 20 mg by mouth every other day.     . balsalazide (COLAZAL) 750 MG capsule Take 2,250 mg by mouth 3 (three) times daily.    [name] ELIQUIS 5 MG TABS tablet TAKE 1 TABLET  TWICE A DAY 180 tablet 1  . escitalopram (LEXAPRO) 10 MG tablet Take 10 mg by mouth daily.      . ferrous sulfate 325 (65 FE) MG tablet Take 325 mg by mouth daily.    . finasteride (PROSCAR) 5 MG tablet Take 5 mg by mouth See admin instructions. Take one tablet (5 mg) by mouth every day except Wednesdays    . furosemide (LASIX) 40 MG tablet Take 2 pills (80 mg) for 5 days and then a pill and a half thereafter (60 mg) 30 tablet 1  . latanoprost (XALATAN) 0.005 % ophthalmic solution Place 1 drop into both eyes at bedtime.     [name] losartan (COZAAR) 100 MG tablet Take 1 tablet (100 mg total) by mouth daily.  3  . mesalamine (CANASA) 1000 MG suppository Place 1,000 mg rectally at bedtime as needed (ulcerative colitis).    . metoprolol succinate (TOPROL-XL) 50 MG 24 hr tablet Take 50 mg by mouth daily. Take with or immediately following a meal.    . Multiple Vitamin (MULTIVITAMIN WITH MINERALS) TABS tablet Take 1 tablet by mouth daily.     No current facility-administered medications on file prior to visit.    Allergies  Allergen Reactions  . No Known Allergies     Objective: Physical Exam  General: Well developed, nourished, no acute distress, awake, alert and oriented x 3  Vascular:  Dorsalis pedis  artery 2/4 bilateral. Posterior tibial artery 2/4 bilateral. Skin temperature gradient warm to warm proximal to distal bilateral lower extremities. No varicosities b/l. Pedal hair present bilateral.  Neurological: Gross sensation present via light touch bilateral.   Dermatological: Skin is warm, dry, and supple bilateral. Nails 2-5 b/l are tender, long, thick, and discolored with subungal debris. No interdigital macerations present bilateral.  Resolved porokeratotic lesion noted distal tip of left third digit.  There is a porokeratotic lesion digit and right 2nd digit and plantar left heel with tenderness to palpation. No erythema, no edema, no drainage, no flocculence.   Left second digit  significant for onycholysis of the entire nail plate with visible subungual hematoma.  There is blood oozing from the proximal nail fold when palpated.  There is no purulence nor ascending cellulitis noted surrounding the digit.  Anonychia b/l great toe(s) with evidence of permanent total nail avulsion. Nailbed(s) completely epithelialized and intact.  Musculoskeletal: No symptomatic bony deformities noted bilateral. Muscular strength within normal limits without pain on range of motion. No pain with calf compression bilateral. Hammertoes 2-5 b/l.  Assessment and Plan:  Painful mycotic toenails 2-5 b/l Subungual hematoma with onycholysis of entire nail plate left second digit Porokeratosis right 2nd digit Pt on long term blood thinner  -Examined patient.  -Mechanically debrided and reduced mycotic nails x 7 with sterile nail nipper and dremel nail file without incident. -Painful porokeratotic lesion(s) L heel, L 3rd toe and R 2nd toe pared and enucleated with sterile scalpel blade without incident. -Without need for local anesthetic, left second digit nail plate gently removed from its remaining attachment without pain.  Nail bed cleansed with alcohol.  Silvadene cream was applied to the nail.  Followed by a fabric Band-Aid. -Written instructions were sent home to Dalton wife to apply antibiotic cream to the left second toe nailbed once daily for about 2 weeks. -Inform patient and Dalton Townsend that he should get a new toenail within 3 to 4 months.  They related understanding. -Patient to continue soft, supportive shoe gear daily. -Patient to report any pedal injuries to medical professional immediately. -Patient/POA to call should there be question/concern in the interim.  Return in about 3 months (around 06/28/2020) for nail trim/ Eliquis.  Marzetta Board, DPM

## 2020-04-03 ENCOUNTER — Other Ambulatory Visit: Payer: Self-pay | Admitting: *Deleted

## 2020-04-03 NOTE — Patient Outreach (Signed)
[facility]) Care Management  04/03/2020  Dalton Townsend 10/12/[id number]  RN Health Coach attempted follow up outreach call to patient.  Patient was unavailable. HIPPA compliance voicemail message left with return callback number.  Plan: RN will call patient again within 30 days. Unsuccessful outreach letter sent  [facility] [phone]

## 2020-04-17 DIAGNOSIS — Z950 Presence of cardiac pacemaker: Secondary | ICD-10-CM | POA: Diagnosis not present

## 2020-04-17 DIAGNOSIS — I10 Essential (primary) hypertension: Secondary | ICD-10-CM | POA: Diagnosis not present

## 2020-04-17 DIAGNOSIS — I480 Paroxysmal atrial fibrillation: Secondary | ICD-10-CM | POA: Diagnosis not present

## 2020-04-17 DIAGNOSIS — Q253 Supravalvular aortic stenosis: Secondary | ICD-10-CM | POA: Diagnosis not present

## 2020-04-17 DIAGNOSIS — D6869 Other thrombophilia: Secondary | ICD-10-CM | POA: Diagnosis not present

## 2020-04-17 DIAGNOSIS — R0600 Dyspnea, unspecified: Secondary | ICD-10-CM | POA: Diagnosis not present

## 2020-04-18 ENCOUNTER — Telehealth: Payer: Self-pay | Admitting: *Deleted

## 2020-04-18 NOTE — Telephone Encounter (Signed)
Dalton Pain, MD  Shellia Cleverly, RN I spoke to Dr. Felipa Eth on phone. Recently has had some increased shortness of breath. BNP was checked and was 600 elevated. He has been on Lasix 60 mg once a day. I asked him to increase the Lasix to 60 mg twice daily for the next 4 days.   Can you see if we can get him in next week or so, either myself or Cecille Rubin perhaps.   Thank you   Mark    Atppt scheduled for 8/25 at 10:45 am with Truitt Merle, NP.  Pt is aware and agreeable.

## 2020-04-18 NOTE — Progress Notes (Signed)
CARDIOLOGY OFFICE NOTE  Date:  04/23/2020    Dalton Townsend Date of Birth: 24-Mar-1923 Medical Record #[id number]  PCP:  Dalton Manes, MD  Cardiologist:  Dalton Townsend  Chief Complaint  Patient presents with  . Congestive Heart Failure    Work in visit - seen for Dr. Marlou Townsend    History of Present Illness: Dalton Townsend is a 86 y.o. male who presents today for a work in visit. Seen for Dr. Marlou Townsend.   He has a history of CAD with remote CABG in 1999 - last cath in 03/2018 showing all grafts patent but with carotid disease noted of 99% on the R, severe AS - s/p TAVR in 2019 via trans apical approach, underlying PPM, PAF, PAD with emergent bypass following attempt at transfemoral TAVR.   Last seen in September 2020 by Dr. Marlou Townsend - felt to be doing well.I saw him in March of 2021 - overall felt to be ok. He had gotten more sedentary with the pandemic - some associated worsening of his dyspnea but felt like he was improving - they were to monitor.Then seen in mid June - his daughter Dalton Townsend is a Software engineer at [employer] that I used to work with was with him - his swelling was worse - his AF burden had increased - he was also very anemic. Sent back to PCP - placed on iron. He was much better on follow up 4 weeks ago.  Phone call last week - Dalton Pain, MD  Dalton Cleverly, RN I spoke to Dr. Felipa Townsend on phone. Recently has had some increased shortness of breath. BNP was checked and was 600 elevated. He has been on Lasix 60 mg once a day. I asked him to increase the Lasix to 60 mg twice daily for the next 4 days."  Thus added to my schedule for today.   Comes in today. Here with Dalton Townsend his daughter. Seems to be doing ok. She feels like he is doing pretty well. Weight continues to drop - down 17 pounds since June. No swelling and he reports none as well. Going back to just 60 mg of Lasix daily as of today. Breathing may have been more short - this seems to have improved  with the extra dosing of 4 days. He does not tolerate BID diuretics as a general rule. Probably getting take out about 2 times a week. He is not dizzy. He has no chest Townsend. Has really no Townsend anywhere.   Past Medical History:  Diagnosis Date  . Acute on chronic congestive heart failure ([place]) 04/02/2018  . Aortic stenosis 04/11/2018  . BPH (benign prostatic hyperplasia)   . CAD (coronary artery disease)    a. s/p CABG x6V in 1999, cath 03/2018 showed patent 6/6 bypass grafts  . Carotid artery disease ([place])    a. s/p R CEA, 80-99% restenosis of RICA, to be address after TAVR  . Dyslipidemia   . Essential hypertension   . GAD (generalized anxiety disorder)   . HTN (hypertension) 06/24/2010   Qualifier: Diagnosis of  By: Johnnye Sima MD, Dellis Filbert    . Intestinal infection due to Clostridium difficile 03/2010  . PAF (paroxysmal atrial fibrillation) (HCC)    a. on Eliquis  . Presence of permanent cardiac pacemaker   . PVD (peripheral vascular disease) ([place])   . S/P femoral-femoral bypass surgery    a. emergent fem fem bypass due to iliac complication during first attempt TAVR placement on 04/11/18 by  Dr. Scot Dock  . S/P TAVR (transcatheter aortic valve replacement)    a. 04/18/18: s/p succesful TAVR with a [place] 3 THV  . Severe aortic stenosis   . Skin cancer   . Ulcerative colitis   . Ulcerative colitis ([place]) 06/24/2010   Qualifier: Diagnosis of  By: Johnnye Sima MD, Dellis Filbert      Past Surgical History:  Procedure Laterality Date  . BALLOON AORTIC VALVE VALVULOPLASTY N/A 04/11/2018   Procedure: BALLOON AORTIC VALVE VALVULOPLASTY.  RIGHT EXTERNAL ILIAC ARTERY OVERSEW.;  Surgeon: Sherren Mocha, MD;  Location: [facility];  Service: Open Heart Surgery;  Laterality: N/A;  . CARDIAC CATHETERIZATION  1999  . CAROTID ENDARTERECTOMY Right 2004  . CATARACT EXTRACTION, BILATERAL  2011  . CORONARY ARTERY BYPASS GRAFT  04/1998   3 vessel CAD  . ENDARTERECTOMY FEMORAL Right 04/11/2018   Procedure:  ENDARTERECTOMY FEMORAL;  Surgeon: Sherren Mocha, MD;  Location: [facility];  Service: Open Heart Surgery;  Laterality: Right;  . FEMORAL-FEMORAL BYPASS GRAFT N/A 04/11/2018   Procedure: LEFT TO RIGHT BYPASS GRAFT FEMORAL-FEMORAL ARTERY;  Surgeon: Sherren Mocha, MD;  Location: [facility];  Service: Open Heart Surgery;  Laterality: N/A;  . INCISION AND DRAINAGE OF WOUND Left 05/04/2016   Procedure: SURGICAL PREP FOR GRAFTING LEFT LEG WITH THERA SKIN APPLICATION;  Surgeon: Irene Limbo, MD;  Location: [facility];  Service: Plastics;  Laterality: Left;  . INTRAOPERATIVE ARTERIOGRAM  06/2000  . INTRAOPERATIVE TRANSTHORACIC ECHOCARDIOGRAM  04/11/2018   Procedure: INTRAOPERATIVE TRANSTHORACIC ECHOCARDIOGRAM;  Surgeon: Sherren Mocha, MD;  Location: [facility];  Service: Open Heart Surgery;;  . PACEMAKER INSERTION  03/25/2010  . RIGHT/LEFT HEART CATH AND CORONARY/GRAFT ANGIOGRAPHY N/A 04/05/2018   Procedure: RIGHT/LEFT HEART CATH AND CORONARY/GRAFT ANGIOGRAPHY;  Surgeon: Burnell Blanks, MD;  Location: [facility];  Service: Cardiovascular;  Laterality: N/A;  . TEE WITHOUT CARDIOVERSION N/A 04/18/2018   Procedure: TRANSESOPHAGEAL ECHOCARDIOGRAM (TEE);  Surgeon: Sherren Mocha, MD;  Location: [facility];  Service: Open Heart Surgery;  Laterality: N/A;  . TOTAL KNEE ARTHROPLASTY  07/16/09  . TRANSCATHETER AORTIC VALVE REPLACEMENT, TRANSAPICAL N/A 04/18/2018   Procedure: TRANSCATHETER AORTIC VALVE REPLACEMENT, TRANSAPICAL. 20m EDWARDS SAPIEN 3 TRANSCATHETER HEART VALVE.;  Surgeon: CSherren Mocha MD;  Location[place] [facility]  Service: Open Heart Surgery;  Laterality: N/A;  . TRANSURETHRAL RESECTION OF PROSTATE  1998     Medications: Current Meds  Medication Sig  . acetaminophen (TYLENOL) 500 MG tablet Take 1,000 mg by mouth at bedtime.  .Marland KitchenamLODipine (NORVASC) 5 MG tablet Take 5 mg by mouth daily.  .Marland Kitchenamoxicillin (AMOXIL) 500 MG capsule Take 500 mg by mouth 4 (four) times daily as needed. Patient reports taking 4  capsules before any dental procedure.  .Marland Kitchenaspirin 81 MG chewable tablet Chew 1 tablet (81 mg total) by mouth daily.  .Marland Kitchenatorvastatin (LIPITOR) 20 MG tablet Take 20 mg by mouth every other day.   . balsalazide (COLAZAL) 750 MG capsule Take 2,250 mg by mouth 3 (three) times daily.  .Marland KitchenELIQUIS 5 MG TABS tablet TAKE 1 TABLET TWICE A DAY  . escitalopram (LEXAPRO) 10 MG tablet Take 10 mg by mouth daily.    . ferrous sulfate 325 (65 FE) MG tablet Take 325 mg by mouth daily.  . finasteride (PROSCAR) 5 MG tablet Take 5 mg by mouth See admin instructions. Take one tablet (5 mg) by mouth every day except Wednesdays  . furosemide (LASIX) 40 MG tablet Take 60 mg by mouth daily. One tablet and one  half daily (60 mg )  . latanoprost (XALATAN) 0.005 % ophthalmic solution Place 1 drop into both eyes at bedtime.   [name] losartan (COZAAR) 100 MG tablet Take 1 tablet (100 mg total) by mouth daily.  . mesalamine (CANASA) 1000 MG suppository Place 1,000 mg rectally at bedtime as needed (ulcerative colitis).  . metoprolol succinate (TOPROL-XL) 50 MG 24 hr tablet Take 50 mg by mouth daily. Take with or immediately following a meal.  . Multiple Vitamin (MULTIVITAMIN WITH MINERALS) TABS tablet Take 1 tablet by mouth daily.     Allergies: Allergies  Allergen Reactions  . No Known Allergies     Social History: The patient  reports that he quit smoking about 67 years ago. His smoking use included cigarettes. He quit after 10.00 years of use. He has quit using smokeless tobacco. He reports that he does not drink alcohol and does not use drugs.   Family History: The patient's family history includes Cancer in an other family member; Congestive Heart Failure in his mother; Heart failure in an other family member; Leukemia in his father and another family member.   Review of Systems: Please see the history of present illness.   All other systems are reviewed and negative.   Physical Exam: VS:  BP (!) 160/70 (BP Location:  Left Arm, Patient Position: Sitting, Cuff Size: Normal)   Pulse 60   Ht 5' 11"  (1.803 m)   Wt 187 lb 12.8 oz (85.2 kg)   SpO2 96% Comment: at rest  BMI 26.19 kg/m  .  BMI Body mass index is 26.19 kg/m.  Wt Readings from Last 3 Encounters:  04/23/20 187 lb 12.8 oz (85.2 kg)  03/25/20 195 lb 9.6 oz (88.7 kg)  02/13/20 204 lb 12.8 oz (92.9 kg)   BP is 150/70 by me.   General: Elderly. Alert and in no acute distress. His weight continues to drop. His weight is down 17 pounds since June.    Cardiac: Pretty regular - probably paced. Outflow murmur noted.  No edema. He has support stockings on.  Respiratory:  Lungs are clear to auscultation bilaterally with normal work of breathing.  GI: Soft and nontender.  MS: No deformity or atrophy. Gait and ROM intact. Using a walker.  Skin: Warm and dry. Color is normal.  Neuro:  Strength and sensation are intact and no gross focal deficits noted.  Psych: Alert, appropriate and with normal affect.   LABORATORY DATA:  EKG:  EKG is not ordered today.   Lab Results  Component Value Date   WBC 6.7 03/25/2020   HGB 10.9 (L) 03/25/2020   HCT 33.8 (L) 03/25/2020   PLT 153 03/25/2020   GLUCOSE 92 03/25/2020   ALT 6 02/13/2020   AST 18 02/13/2020   NA 142 03/25/2020   K 4.2 03/25/2020   CL 105 03/25/2020   CREATININE 1.11 03/25/2020   BUN 18 03/25/2020   CO2 22 03/25/2020   INR 1.11 09/07/2018   HGBA1C 5.4 04/07/2018       BNP (last 3 results) No results for input(s): BNP in the last 8760 hours.  ProBNP (last 3 results) No results for input(s): PROBNP in the last 8760 hours.   Other Studies Reviewed Today:  ECHOIMPRESSIONS9/2020  1. The left ventricle has normal systolic function with an ejection  fraction of 60-65%. The cavity size was normal. There is moderate  asymmetric left ventricular hypertrophy of the septal wall. Left  ventricular diastolic Doppler parameters are  indeterminate. There  is abnormal septal motion  consistent with RV  pacemaker. No evidence of left ventricular regional wall motion  abnormalities.  2. The right ventricle has moderately reduced systolic function. The  cavity was normal. There is no increase in right ventricular wall  thickness. Right ventricular systolic pressure is normal.  3. Left atrial size was mildly dilated.  4. There is mild to moderate mitral annular calcification present.  5. A 26 an Edwards Edwards Sapien bioprosthetic aortic valve (TAVR) valve  is present in the aortic position. Procedure Date: 04/18/2018 Echo findings  are consistent with perivalvular leak of the aortic prosthesis.  6. Aortic valve regurgitation mild perivalvular.  7. The aorta is normal unless otherwise noted.  8. The aortic root and ascending aorta are normal in size and structure.   SUMMARY    S/P Redo TAVR 04/18/2018 with 26 mm Edwards Sapien 3 valve via  transapical approach. Trivial to mild perivalvular leak. Gradients  stable to improved from prior study (which was 1 mos post TAVR).  Study otherwise unchanged from prior    RIGHT/LEFT HEART CATH AND CORONARY/GRAFT ANGIOGRAPHY8/2019  Conclusion    Mid LM to Ost LAD lesion is 100% stenosed.  Ost Cx to Prox Cx lesion is 100% stenosed.  LIMA graft was visualized by angiography and is normal in caliber.  SVG graft was visualized by angiography and is normal in caliber.  SVG graft was visualized by angiography and is normal in caliber.  Prox RCA lesion is 50% stenosed.  Dist RCA lesion is 100% stenosed.  SVG graft was visualized by angiography and is normal in caliber.  Hemodynamic findings consistent with mild pulmonary hypertension.  1. Severe triple vessel CAD s/p 6VCABG with 6 patent grafts.  2. Chronic occlusion proximal LAD and proximal Circumflex, mid to distal RCA 3. Severe aortic stenosis by echo. Unable to advance catheters into the LV due to tortuosity in the left subclavian artery. Coronary  angiography was complete and I did not think it would be worthwhile to gain femoral artery access just to cross his valve. No LV pressures or aortic valve data obtained today by cath.   Recommendations: Will continue workup for TAVR.     Assessment/Plan:  1. Chronic diastolic HF - some recent flares - probably exacerbated by anemia that his new, increasing AF burden, advancing age, etc. Will recheck lab today. Favor conservative management with goal of safety. May need periodic increase in diuretics - he does not like BID dosing.   2. Persistent AF - remains on Toprol and anticoagulation with underlying PPM in place.   3. HTN - BP is ok given his age - I have left him on his current regimen.   4. CAD - remote CABG - no active chest Townsend.   5. Prior TAVR for AS - last echo noted.   6. Anemia - unclear etiology - he is not a candidate for evaluation. Does remain on aspirin and Eliquis.   7. PAD - not discussed.   8. Advancing age - goal is safety.   9. Underlying PPM  10. Carotid artery disease:pre TAVR dopplers showed 80-99% RICA stenosis where he previously had R CEA. Dr. Margarita Mail.Not discussed today.   Current medicines are reviewed with the patient today.  The patient does not have concerns regarding medicines other than what has been noted above.  The following changes have been made:  See above.  Labs/ tests ordered today include:    Orders Placed This Encounter  Procedures  .  Basic metabolic panel  . CBC  . Pro b natriuretic peptide (BNP)     Disposition:   FU with me as planned in November.     Patient is agreeable to this plan and will call if any problems develop in the interim.   SignedTruitt Merle, NP  04/23/2020 11:18 AM  [place] [address] [place], [place]  [address] Phone: [phone] Fax: [phone]

## 2020-04-23 ENCOUNTER — Encounter: Payer: Self-pay | Admitting: Nurse Practitioner

## 2020-04-23 ENCOUNTER — Ambulatory Visit (INDEPENDENT_AMBULATORY_CARE_PROVIDER_SITE_OTHER): Payer: Medicare Other | Admitting: Nurse Practitioner

## 2020-04-23 ENCOUNTER — Other Ambulatory Visit: Payer: Self-pay

## 2020-04-23 VITALS — BP 160/70 | HR 60 | Ht 71.0 in | Wt 187.8 lb

## 2020-04-23 DIAGNOSIS — I480 Paroxysmal atrial fibrillation: Secondary | ICD-10-CM | POA: Diagnosis not present

## 2020-04-23 DIAGNOSIS — Z950 Presence of cardiac pacemaker: Secondary | ICD-10-CM

## 2020-04-23 DIAGNOSIS — Z952 Presence of prosthetic heart valve: Secondary | ICD-10-CM | POA: Diagnosis not present

## 2020-04-23 DIAGNOSIS — I5033 Acute on chronic diastolic (congestive) heart failure: Secondary | ICD-10-CM | POA: Diagnosis not present

## 2020-04-23 DIAGNOSIS — I10 Essential (primary) hypertension: Secondary | ICD-10-CM

## 2020-04-23 DIAGNOSIS — I5032 Chronic diastolic (congestive) heart failure: Secondary | ICD-10-CM

## 2020-04-23 NOTE — Patient Instructions (Addendum)
After Visit Summary:  We will be checking the following labs today - BMET, CBC and BNP   Medication Instructions:    Continue with your current medicines.   We will stay on the Lasix 60 mg once a day for now   If you need a refill on your cardiac medications before your next appointment, please call your pharmacy.     Testing/Procedures To Be Arranged:  N/A  Follow-Up:   See me as planned in November    At [facility], you and your health needs are our priority.  As part of our continuing mission to provide you with exceptional heart care, we have created designated Provider Care Teams.  These Care Teams include your primary Cardiologist (physician) and Advanced Practice Providers (APPs -  Physician Assistants and Nurse Practitioners) who all work together to provide you with the care you need, when you need it.  Special Instructions:  . Stay safe, wash your hands for at least 20 seconds and wear a mask when needed.  . It was good to talk with you today.    Call the [employer] office at [phone] if you have any questions, problems or concerns.

## 2020-04-24 ENCOUNTER — Other Ambulatory Visit: Payer: Self-pay | Admitting: *Deleted

## 2020-04-24 DIAGNOSIS — I5033 Acute on chronic diastolic (congestive) heart failure: Secondary | ICD-10-CM

## 2020-04-24 LAB — BASIC METABOLIC PANEL
BUN/Creatinine Ratio: 15 (ref 10–24)
BUN: 17 mg/dL (ref 10–36)
CO2: 25 mmol/L (ref 20–29)
Calcium: 9.4 mg/dL (ref 8.6–10.2)
Chloride: 102 mmol/L (ref 96–106)
Creatinine, Ser: 1.16 mg/dL (ref 0.76–1.27)
GFR calc Af Amer: 62 mL/min/{1.73_m2} (ref >59)
GFR calc non Af Amer: 54 mL/min/{1.73_m2} — ABNORMAL LOW (ref >59)
Glucose: 86 mg/dL (ref 65–99)
Potassium: 4 mmol/L (ref 3.5–5.2)
Sodium: 141 mmol/L (ref 134–144)

## 2020-04-24 LAB — CBC
Hematocrit: 33 % — ABNORMAL LOW (ref 37.5–51.0)
Hemoglobin: 10.9 g/dL — ABNORMAL LOW (ref 13.0–17.7)
MCH: 30.4 pg (ref 26.6–33.0)
MCHC: 33 g/dL (ref 31.5–35.7)
MCV: 92 fL (ref 79–97)
Platelets: 143 10*3/uL — ABNORMAL LOW (ref 150–450)
RBC: 3.59 x10E6/uL — ABNORMAL LOW (ref 4.14–5.80)
RDW: 14.5 % (ref 11.6–15.4)
WBC: 6 10*3/uL (ref 3.4–10.8)

## 2020-04-24 LAB — PRO B NATRIURETIC PEPTIDE: NT-Pro BNP: 7476 pg/mL — ABNORMAL HIGH (ref 0–486)

## 2020-04-24 MED ORDER — FUROSEMIDE 40 MG PO TABS: 80.0000 mg | ORAL_TABLET | Freq: Every day | ORAL | 9 refills | Status: DC

## 2020-04-30 ENCOUNTER — Encounter: Payer: Self-pay | Admitting: *Deleted

## 2020-05-02 NOTE — Telephone Encounter (Signed)
This encounter was created in error - please disregard.

## 2020-05-08 ENCOUNTER — Other Ambulatory Visit: Payer: Self-pay | Admitting: *Deleted

## 2020-05-08 NOTE — Patient Outreach (Signed)
[facility]) Care Management  [place]  05/08/2020   Dalton Townsend 1922/12/14 [id number]  RN Health Coach telephone call to patient.  [employer] compliance verified. Per patient he is doing good. Patient has no swelling in lower extremities at this time.  The shortness of breath has decreased since the cardiologist increased his lasix.  Per patient his weight today was 184 pounds. Per patient he is taking 80 mg lasix a day as per ordered. Patient has received his COVID vaccine. Per patient he has stopped driving and family takes him to appointments. Patient has agreed to follow up outreach calls.  Encounter Medications:  Outpatient Encounter Medications as of 05/08/2020  Medication Sig  . acetaminophen (TYLENOL) 500 MG tablet Take 1,000 mg by mouth at bedtime.  [name] amLODipine (NORVASC) 5 MG tablet Take 5 mg by mouth daily.  [name] amoxicillin (AMOXIL) 500 MG capsule Take 500 mg by mouth 4 (four) times daily as needed. Patient reports taking 4 capsules before any dental procedure.  [name] aspirin 81 MG chewable tablet Chew 1 tablet (81 mg total) by mouth daily.  [name] atorvastatin (LIPITOR) 20 MG tablet Take 20 mg by mouth every other day.   . balsalazide (COLAZAL) 750 MG capsule Take 2,250 mg by mouth 3 (three) times daily.  [name] ELIQUIS 5 MG TABS tablet TAKE 1 TABLET TWICE A DAY  . escitalopram (LEXAPRO) 10 MG tablet Take 10 mg by mouth daily.    . ferrous sulfate 325 (65 FE) MG tablet Take 325 mg by mouth daily.  . finasteride (PROSCAR) 5 MG tablet Take 5 mg by mouth See admin instructions. Take one tablet (5 mg) by mouth every day except Wednesdays  . furosemide (LASIX) 40 MG tablet Take 2 tablets (80 mg total) by mouth daily.  [name] latanoprost (XALATAN) 0.005 % ophthalmic solution Place 1 drop into both eyes at bedtime.   [name] losartan (COZAAR) 100 MG tablet Take 1 tablet (100 mg total) by mouth daily.  . mesalamine (CANASA) 1000 MG suppository Place 1,000 mg rectally at bedtime as  needed (ulcerative colitis).  . metoprolol succinate (TOPROL-XL) 50 MG 24 hr tablet Take 50 mg by mouth daily. Take with or immediately following a meal.  . Multiple Vitamin (MULTIVITAMIN WITH MINERALS) TABS tablet Take 1 tablet by mouth daily.   No facility-administered encounter medications on file as of 05/08/2020.    Functional Status:  In your present state of health, do you have any difficulty performing the following activities: 09/07/2019  Hearing? N  Vision? N  Difficulty concentrating or making decisions? N  Walking or climbing stairs? Y  Comment uses a walker and has some swelling in left leg  Dressing or bathing? N  Doing errands, shopping? Y  Preparing Food and eating ? Y  Comment wife assists  Using the Toilet? N  In the past six months, have you accidently leaked urine? N  Comment followed by urologist and on medications  Do you have problems with loss of bowel control? N  Comment followed by gastroenterologist  Managing your Medications? N  Managing your Finances? N  Housekeeping or managing your Housekeeping? Y  Some recent data might be hidden    Fall/Depression Screening: Fall Risk  12/06/2019 09/07/2019  Falls in the past year? 1 1  Number falls in past yr: 0 0  Injury with Fall? 1 1  Risk for fall due to : History of fall(s);Impaired balance/gait;Impaired mobility History of fall(s);Impaired balance/gait;Impaired mobility  Follow up Falls  evaluation completed Falls evaluation completed;Falls prevention discussed   PHQ 2/9 Scores 12/06/2019  PHQ - 2 Score 0   Goals Addressed            This Visit's Progress   . Client verbalize knowledge of Heart Failure disease self management skills bymonitoring weight, medication adherence and no admisssions for CHF exacerbations       CARE PLAN ENTRY (see longtitudinal plan of care for additional care plan information)   Current Barriers:  [name] Knowledge deficit related to basic heart failure pathophysiology and self  care management  Case Manager Clinical Goal(s):  [name] Over the next 90 days, patient will verbalize understanding of Heart Failure Action Plan and when to call doctor . Over the next 90 days, patient will take all Heart Failure mediations as prescribed . Over the next 90 days, patient will weigh daily and record (notifying MD of 3 lb weight gain over night or 5 lb in a week) . Patient will verbalize two symptoms of CHF exacerbation within the next 90 days.   . Patient will verbalize receiving annual flu vaccine within the next 90 days.   Interventions:  . Basic overview and discussion of pathophysiology of Heart Failure reviewed  . Provided verbal education on low sodium diet . Provided written education on low sodium diet . Discussed importance of daily weight and advised patient to weigh and record daily . Reviewed role of diuretics in prevention of fluid overload and management of heart failure  Patient Self Care Activities:  . Takes Heart Failure Medications as prescribed . Weighs daily and record (notifying MD of 3 lb weight gain over night or 5 lb in a week) . Verbalizes understanding of and follows CHF Action Plan . Adheres to low sodium diet  Initial goal documentation         Assessment:  Wt 184 Has received COVID vaccine Taking Lasix 80 mg qd Per patient he is trying to adhere to diet Plan:  Discussed low sodium diet Patient will follow up with COVID booster Patient will follow up future appointments Patient will follow up with flu vaccine Provided Exercise activity booklet Patient will continue to monitor weight RN sent update assessment to PCP RN Health Coach will follow up within the month of December  Fionna [facility] [facility] [phone]

## 2020-05-14 ENCOUNTER — Other Ambulatory Visit: Payer: Self-pay

## 2020-05-14 ENCOUNTER — Other Ambulatory Visit: Payer: Medicare Other | Admitting: *Deleted

## 2020-05-14 DIAGNOSIS — I5033 Acute on chronic diastolic (congestive) heart failure: Secondary | ICD-10-CM

## 2020-05-14 LAB — BASIC METABOLIC PANEL
BUN/Creatinine Ratio: 21 (ref 10–24)
BUN: 25 mg/dL (ref 10–36)
CO2: 26 mmol/L (ref 20–29)
Calcium: 9.6 mg/dL (ref 8.6–10.2)
Chloride: 101 mmol/L (ref 96–106)
Creatinine, Ser: 1.18 mg/dL (ref 0.76–1.27)
GFR calc Af Amer: 61 mL/min/{1.73_m2} (ref >59)
GFR calc non Af Amer: 53 mL/min/{1.73_m2} — ABNORMAL LOW (ref >59)
Glucose: 85 mg/dL (ref 65–99)
Potassium: 3.9 mmol/L (ref 3.5–5.2)
Sodium: 140 mmol/L (ref 134–144)

## 2020-05-15 ENCOUNTER — Other Ambulatory Visit: Payer: Medicare Other

## 2020-05-19 NOTE — Telephone Encounter (Signed)
Per Truitt Merle, NP pt will stay on lasix two tablets by mouth (80 mg) daily.

## 2020-05-27 ENCOUNTER — Ambulatory Visit (INDEPENDENT_AMBULATORY_CARE_PROVIDER_SITE_OTHER): Payer: Medicare Other | Admitting: Emergency Medicine

## 2020-05-27 DIAGNOSIS — I442 Atrioventricular block, complete: Secondary | ICD-10-CM

## 2020-05-27 LAB — CUP PACEART REMOTE DEVICE CHECK
Battery Impedance: 1736 Ohm
Battery Remaining Longevity: 37 mo
Battery Voltage: 2.75 V
Brady Statistic AP VP Percent: 67 %
Brady Statistic AP VS Percent: 1 %
Brady Statistic AS VP Percent: 30 %
Brady Statistic AS VS Percent: 2 %
Date Time Interrogation Session: 20210928135526
Implantable Lead Implant Date: 20110725
Implantable Lead Implant Date: 20110725
Implantable Lead Location: 753859
Implantable Lead Location: 753860
Implantable Lead Model: 5076
Implantable Lead Model: 5076
Implantable Pulse Generator Implant Date: 20110725
Lead Channel Impedance Value: 488 Ohm
Lead Channel Impedance Value: 567 Ohm
Lead Channel Pacing Threshold Amplitude: 0.375 V
Lead Channel Pacing Threshold Amplitude: 0.625 V
Lead Channel Pacing Threshold Pulse Width: 0.4 ms
Lead Channel Pacing Threshold Pulse Width: 0.4 ms
Lead Channel Setting Pacing Amplitude: 2 V
Lead Channel Setting Pacing Amplitude: 2.5 V
Lead Channel Setting Pacing Pulse Width: 0.4 ms
Lead Channel Setting Sensing Sensitivity: 2 mV

## 2020-05-29 NOTE — Progress Notes (Signed)
Remote pacemaker transmission.   

## 2020-06-01 DIAGNOSIS — Z23 Encounter for immunization: Secondary | ICD-10-CM | POA: Diagnosis not present

## 2020-06-10 ENCOUNTER — Other Ambulatory Visit: Payer: Self-pay | Admitting: Cardiology

## 2020-06-11 NOTE — Telephone Encounter (Signed)
Eliquis 82m refill request received. Patient is 86years old, weight-85.2kg, Crea-1.18 on 05/14/2020, Diagnosis-Afib, and last seen by LTruitt Merleon 04/23/2020. Dose is appropriate based on dosing criteria. Will send in refill to requested pharmacy.

## 2020-06-12 DIAGNOSIS — Z23 Encounter for immunization: Secondary | ICD-10-CM | POA: Diagnosis not present

## 2020-06-18 NOTE — Progress Notes (Signed)
CARDIOLOGY OFFICE NOTE  Date:  07/01/2020    Dalton Townsend Date of Birth: [birth year] Medical Record #[id number]  PCP:  Lajean Manes, MD  Cardiologist:  Marisa Cyphers  Chief Complaint  Patient presents with  . Follow-up    Seen for Dr. Marlou Porch    History of Present Illness: Dalton Townsend is a 86 y.o. male who presents today for a follow up visit. Seen for Dr. Marlou Porch.   He has a history of CAD with remote CABG in 1999 - last cath in 03/2018 showing all grafts patent but with carotid disease noted of 99% on the R, severe AS - s/p TAVR in 2019 via trans apical approach, underlying PPM, PAF, PAD with emergent bypass following attempt at transfemoral TAVR.   Last seen in September 2020 by Dr. Marlou Porch - felt to be doing well.I saw him in March of 2021 - overall felt to be ok. He had gotten more sedentary with the pandemic - some associated worsening of his dyspnea but felt like he was improving - they were to monitor.Then seen in mid June - his daughter [name] is a Software engineer at [employer] that I used to work with was with him - his swelling was worse - his AF burden had increased - he was also very anemic. Sent back to PCP - placed on iron. He was much better on follow up 4 weeks ago.  Seen again as a work in back in August - felt to be doing ok. We elected to leave him on higher doses of diuretics to keep him "dry".   Comes in today. Here with [name] his daughter and also a Software engineer with [employer]. He is doing well - they both agree. No swelling. Breathing seems stable. No falls. Does endorse urinary frequency. No chest pain noted. Remains on his iron therapy as well. No real concerns today.   Past Medical History:  Diagnosis Date  . Acute on chronic congestive heart failure ([place]) 04/02/2018  . Aortic stenosis 04/11/2018  . BPH (benign prostatic hyperplasia)   . CAD (coronary artery disease)    a. s/p CABG x6V in 1999, cath 03/2018 showed patent 6/6 bypass grafts  . Carotid  artery disease ([place])    a. s/p R CEA, 80-99% restenosis of RICA, to be address after TAVR  . Dyslipidemia   . Essential hypertension   . GAD (generalized anxiety disorder)   . HTN (hypertension) 06/24/2010   Qualifier: Diagnosis of  By: Johnnye Sima MD, Dellis Filbert    . Intestinal infection due to Clostridium difficile 03/2010  . PAF (paroxysmal atrial fibrillation) ([facility])    a. on Eliquis  . Presence of permanent cardiac pacemaker   . PVD (peripheral vascular disease) ([place])   . S/P femoral-femoral bypass surgery    a. emergent fem fem bypass due to iliac complication during first attempt TAVR placement on 04/11/18 by Dr. Scot Dock  . S/P TAVR (transcatheter aortic valve replacement)    a. 04/18/18: s/p succesful TAVR with a Crum 3 THV  . Severe aortic stenosis   . Skin cancer   . Ulcerative colitis   . Ulcerative colitis ([place]) 06/24/2010   Qualifier: Diagnosis of  By: Johnnye Sima MD, Dellis Filbert      Past Surgical History:  Procedure Laterality Date  . BALLOON AORTIC VALVE VALVULOPLASTY N/A 04/11/2018   Procedure: BALLOON AORTIC VALVE VALVULOPLASTY.  RIGHT EXTERNAL ILIAC ARTERY OVERSEW.;  Surgeon: Sherren Mocha, MD;  Location: [facility];  Service: Open Heart  Surgery;  Laterality: N/A;  . CARDIAC CATHETERIZATION  1999  . CAROTID ENDARTERECTOMY Right 2004  . CATARACT EXTRACTION, BILATERAL  2011  . CORONARY ARTERY BYPASS GRAFT  04/1998   3 vessel CAD  . ENDARTERECTOMY FEMORAL Right 04/11/2018   Procedure: ENDARTERECTOMY FEMORAL;  Surgeon: Sherren Mocha, MD;  Location: [facility];  Service: Open Heart Surgery;  Laterality: Right;  . FEMORAL-FEMORAL BYPASS GRAFT N/A 04/11/2018   Procedure: LEFT TO RIGHT BYPASS GRAFT FEMORAL-FEMORAL ARTERY;  Surgeon: Sherren Mocha, MD;  Location: [facility];  Service: Open Heart Surgery;  Laterality: N/A;  . INCISION AND DRAINAGE OF WOUND Left 05/04/2016   Procedure: SURGICAL PREP FOR GRAFTING LEFT LEG WITH THERA SKIN APPLICATION;  Surgeon: Irene Limbo, MD;  Location:  [facility];  Service: Plastics;  Laterality: Left;  . INTRAOPERATIVE ARTERIOGRAM  06/2000  . INTRAOPERATIVE TRANSTHORACIC ECHOCARDIOGRAM  04/11/2018   Procedure: INTRAOPERATIVE TRANSTHORACIC ECHOCARDIOGRAM;  Surgeon: Sherren Mocha, MD;  [facility];  Service: Open Heart Surgery;;  . PACEMAKER INSERTION  03/25/2010  . RIGHT/LEFT HEART CATH AND CORONARY/GRAFT ANGIOGRAPHY N/A 04/05/2018   Procedure: RIGHT/LEFT HEART CATH AND CORONARY/GRAFT ANGIOGRAPHY;  Surgeon: Burnell Blanks, MD;  Location: [facility];  Service: Cardiovascular;  Laterality: N/A;  . TEE WITHOUT CARDIOVERSION N/A 04/18/2018   Procedure: TRANSESOPHAGEAL ECHOCARDIOGRAM (TEE);  Surgeon: Sherren Mocha, MD;  Location: [facility];  Service: Open Heart Surgery;  Laterality: N/A;  . TOTAL KNEE ARTHROPLASTY  07/16/09  . TRANSCATHETER AORTIC VALVE REPLACEMENT, TRANSAPICAL N/A 04/18/2018   Procedure: TRANSCATHETER AORTIC VALVE REPLACEMENT, TRANSAPICAL. 83m EDWARDS SAPIEN 3 TRANSCATHETER HEART VALVE.;  Surgeon: CSherren Mocha MD;  Location: [facility]  Service: Open Heart Surgery;  Laterality: N/A;  . TRANSURETHRAL RESECTION OF PROSTATE  1998     Medications: Current Meds  Medication Sig  . acetaminophen (TYLENOL) 500 MG tablet Take 1,000 mg by mouth at bedtime.  .Marland KitchenamLODipine (NORVASC) 5 MG tablet Take 5 mg by mouth daily.  .Marland Kitchenamoxicillin (AMOXIL) 500 MG capsule Take 500 mg by mouth 4 (four) times daily as needed. Patient reports taking 4 capsules before any dental procedure.  .Marland Kitchenaspirin 81 MG chewable tablet Chew 1 tablet (81 mg total) by mouth daily.  .Marland Kitchenatorvastatin (LIPITOR) 20 MG tablet Take 20 mg by mouth every other day.   . balsalazide (COLAZAL) 750 MG capsule Take 2,250 mg by mouth 3 (three) times daily.  .Marland KitchenELIQUIS 5 MG TABS tablet TAKE 1 TABLET TWICE A DAY  . escitalopram (LEXAPRO) 10 MG tablet Take 10 mg by mouth daily.    . ferrous sulfate 325 (65 FE) MG tablet Take 325 mg by mouth daily.  . finasteride (PROSCAR) 5 MG  tablet Take 5 mg by mouth See admin instructions. Take one tablet (5 mg) by mouth every day except Wednesdays  . furosemide (LASIX) 40 MG tablet Take 2 tablets (80 mg total) by mouth daily.  .Marland Kitchenlatanoprost (XALATAN) 0.005 % ophthalmic solution Place 1 drop into both eyes at bedtime.   .Marland Kitchenlosartan (COZAAR) 100 MG tablet Take 1 tablet (100 mg total) by mouth daily.  . mesalamine (CANASA) 1000 MG suppository Place 1,000 mg rectally at bedtime as needed (ulcerative colitis).  . metoprolol succinate (TOPROL-XL) 50 MG 24 hr tablet Take 50 mg by mouth daily. Take with or immediately following a meal.  . Multiple Vitamin (MULTIVITAMIN WITH MINERALS) TABS tablet Take 1 tablet by mouth daily.     Allergies: Allergies  Allergen Reactions  . No Known Allergies  Social History: The patient  reports that he quit smoking about 67 years ago. His smoking use included cigarettes. He quit after 10.00 years of use. He has quit using smokeless tobacco. He reports that he does not drink alcohol and does not use drugs.   Family History: The patient's family history includes Cancer in an other family member; Congestive Heart Failure in his mother; Heart failure in an other family member; Leukemia in his father and another family member.   Review of Systems: Please see the history of present illness.   All other systems are reviewed and negative.   Physical Exam: VS:  BP (!) 152/88   Pulse 78   Ht 5' 11"  (1.803 m)   Wt 186 lb 6.4 oz (84.6 kg)   SpO2 92%   BMI 26.00 kg/m  .  BMI Body mass index is 26 kg/m.  Wt Readings from Last 3 Encounters:  07/01/20 186 lb 6.4 oz (84.6 kg)  04/23/20 187 lb 12.8 oz (85.2 kg)  03/25/20 195 lb 9.6 oz (88.7 kg)    General: Elderly. Alert and in no acute distress.   Cardiac: Irregular irregular rhythm. Rate is fine.  No edema. He has support stockings in place.  Respiratory:  Lungs are clear to auscultation bilaterally with normal work of breathing.  GI: Soft and  nontender.  MS: No deformity or atrophy. Gait and ROM intact. He is using a walker.  Skin: Warm and dry. Color is normal.  Neuro:  Strength and sensation are intact and no gross focal deficits noted.  Psych: Alert, appropriate and with normal affect.   LABORATORY DATA:  EKG:  EKG is not ordered today.    Lab Results  Component Value Date   WBC 6.0 04/23/2020   HGB 10.9 (L) 04/23/2020   HCT 33.0 (L) 04/23/2020   PLT 143 (L) 04/23/2020   GLUCOSE 85 05/14/2020   ALT 6 02/13/2020   AST 18 02/13/2020   NA 140 05/14/2020   K 3.9 05/14/2020   CL 101 05/14/2020   CREATININE 1.18 05/14/2020   BUN 25 05/14/2020   CO2 26 05/14/2020   INR 1.11 09/07/2018   HGBA1C 5.4 04/07/2018         BNP (last 3 results) No results for input(s): BNP in the last 8760 hours.  ProBNP (last 3 results) Recent Labs    04/23/20 1118  PROBNP 7,476*     Other Studies Reviewed Today:  ECHOIMPRESSIONS9/2020  1. The left ventricle has normal systolic function with an ejection  fraction of 60-65%. The cavity size was normal. There is moderate  asymmetric left ventricular hypertrophy of the septal wall. Left  ventricular diastolic Doppler parameters are  indeterminate. There is abnormal septal motion consistent with RV  pacemaker. No evidence of left ventricular regional wall motion  abnormalities.  2. The right ventricle has moderately reduced systolic function. The  cavity was normal. There is no increase in right ventricular wall  thickness. Right ventricular systolic pressure is normal.  3. Left atrial size was mildly dilated.  4. There is mild to moderate mitral annular calcification present.  5. A 26 an Edwards Edwards Sapien bioprosthetic aortic valve (TAVR) valve  is present in the aortic position. Procedure Date: 04/18/2018 Echo findings  are consistent with perivalvular leak of the aortic prosthesis.  6. Aortic valve regurgitation mild perivalvular.  7. The aorta is normal  unless otherwise noted.  8. The aortic root and ascending aorta are normal in size and structure.  SUMMARY    S/P Redo TAVR 04/18/2018 with 26 mm Edwards Sapien 3 valve via  transapical approach. Trivial to mild perivalvular leak. Gradients  stable to improved from prior study (which was 1 mos post TAVR).  Study otherwise unchanged from prior    RIGHT/LEFT HEART CATH AND CORONARY/GRAFT ANGIOGRAPHY8/2019  Conclusion    Mid LM to Ost LAD lesion is 100% stenosed.  Ost Cx to Prox Cx lesion is 100% stenosed.  LIMA graft was visualized by angiography and is normal in caliber.  SVG graft was visualized by angiography and is normal in caliber.  SVG graft was visualized by angiography and is normal in caliber.  Prox RCA lesion is 50% stenosed.  Dist RCA lesion is 100% stenosed.  SVG graft was visualized by angiography and is normal in caliber.  Hemodynamic findings consistent with mild pulmonary hypertension.  1. Severe triple vessel CAD s/p 6VCABG with 6 patent grafts.  2. Chronic occlusion proximal LAD and proximal Circumflex, mid to distal RCA 3. Severe aortic stenosis by echo. Unable to advance catheters into the LV due to tortuosity in the left subclavian artery. Coronary angiography was complete and I did not think it would be worthwhile to gain femoral artery access just to cross his valve. No LV pressures or aortic valve data obtained today by cath.   Recommendations: Will continue workup for TAVR.     Assessment/Plan:  1. Chronic diastolic HF - seems to be setting down - he has been maintained on 80 mg of Lasix - recheck his labs today - would favor continuing this dose as long as his kidney function and potassium are ok. He does not like BID dosing.   2. Persistent AF - remains on Toprol and anticoagulation with underlying PPM in place. His rate is fine. No issues with his Eliquis.   3. HTN - would follow.   4. CAD - remote CABG - no active chest pain -  would favor conservative management.   5. Prior TAVR for AS  6. Anemia - unclear etiology - not candidate for evaluation - on iron. Rechecking lab today.   7. PAD - not discussed.   8. Underlying PPM  9. Advancing age  42.Carotid artery disease:pre TAVR dopplers showed 80-99% RICA stenosis where he previously had R CEA. Dr. Margarita Mail.Not discussed today.   Current medicines are reviewed with the patient today.  The patient does not have concerns regarding medicines other than what has been noted above.  The following changes have been made:  See above.  Labs/ tests ordered today include:    Orders Placed This Encounter  Procedures  . Basic metabolic panel  . CBC     Disposition:   FU with Dr. Marlou Porch in March. He is aware that I am leaving soon.    Patient is agreeable to this plan and will call if any problems develop in the interim.   SignedTruitt Merle, NP  07/01/2020 12:40 PM  [facility] [address] [place], [place]  [address] Phone: [phone] Fax: [phone]

## 2020-06-25 ENCOUNTER — Ambulatory Visit: Payer: Medicare Other | Admitting: Nurse Practitioner

## 2020-07-01 ENCOUNTER — Other Ambulatory Visit: Payer: Self-pay

## 2020-07-01 ENCOUNTER — Encounter: Payer: Self-pay | Admitting: Nurse Practitioner

## 2020-07-01 ENCOUNTER — Ambulatory Visit (INDEPENDENT_AMBULATORY_CARE_PROVIDER_SITE_OTHER): Payer: Medicare Other | Admitting: Nurse Practitioner

## 2020-07-01 VITALS — BP 152/88 | HR 78 | Ht 71.0 in | Wt 186.4 lb

## 2020-07-01 DIAGNOSIS — I5032 Chronic diastolic (congestive) heart failure: Secondary | ICD-10-CM | POA: Diagnosis not present

## 2020-07-01 DIAGNOSIS — I10 Essential (primary) hypertension: Secondary | ICD-10-CM

## 2020-07-01 DIAGNOSIS — Z952 Presence of prosthetic heart valve: Secondary | ICD-10-CM

## 2020-07-01 DIAGNOSIS — I480 Paroxysmal atrial fibrillation: Secondary | ICD-10-CM

## 2020-07-01 DIAGNOSIS — Z950 Presence of cardiac pacemaker: Secondary | ICD-10-CM

## 2020-07-01 LAB — BASIC METABOLIC PANEL
BUN/Creatinine Ratio: 18 (ref 10–24)
BUN: 19 mg/dL (ref 10–36)
CO2: 24 mmol/L (ref 20–29)
Calcium: 9.5 mg/dL (ref 8.6–10.2)
Chloride: 102 mmol/L (ref 96–106)
Creatinine, Ser: 1.06 mg/dL (ref 0.76–1.27)
GFR calc Af Amer: 69 mL/min/{1.73_m2} (ref >59)
GFR calc non Af Amer: 59 mL/min/{1.73_m2} — ABNORMAL LOW (ref >59)
Glucose: 84 mg/dL (ref 65–99)
Potassium: 4.2 mmol/L (ref 3.5–5.2)
Sodium: 139 mmol/L (ref 134–144)

## 2020-07-01 LAB — CBC
Hematocrit: 33.1 % — ABNORMAL LOW (ref 37.5–51.0)
Hemoglobin: 10.7 g/dL — ABNORMAL LOW (ref 13.0–17.7)
MCH: 29.4 pg (ref 26.6–33.0)
MCHC: 32.3 g/dL (ref 31.5–35.7)
MCV: 91 fL (ref 79–97)
Platelets: 158 10*3/uL (ref 150–450)
RBC: 3.64 x10E6/uL — ABNORMAL LOW (ref 4.14–5.80)
RDW: 15.3 % (ref 11.6–15.4)
WBC: 6.8 10*3/uL (ref 3.4–10.8)

## 2020-07-01 NOTE — Patient Instructions (Addendum)
After Visit Summary:  We will be checking the following labs today - BMET and CBC   Medication Instructions:    Continue with your current medicines.    If you need a refill on your cardiac medications before your next appointment, please call your pharmacy.     Testing/Procedures To Be Arranged:  N/A  Follow-Up:  See Dr. Marlou Porch in March   At [facility], you and your health needs are our priority.  As part of our continuing mission to provide you with exceptional heart care, we have created designated Provider Care Teams.  These Care Teams include your primary Cardiologist (physician) and Advanced Practice Providers (APPs -  Physician Assistants and Nurse Practitioners) who all work together to provide you with the care you need, when you need it.  Special Instructions:  . Stay safe, wash your hands for at least 20 seconds and wear a mask when needed.  . It was good to talk with you today.    Call the [place] office at [phone] if you have any questions, problems or concerns.

## 2020-07-02 ENCOUNTER — Encounter: Payer: Self-pay | Admitting: Podiatry

## 2020-07-02 ENCOUNTER — Ambulatory Visit (INDEPENDENT_AMBULATORY_CARE_PROVIDER_SITE_OTHER): Payer: Medicare Other | Admitting: Podiatry

## 2020-07-02 DIAGNOSIS — B351 Tinea unguium: Secondary | ICD-10-CM | POA: Diagnosis not present

## 2020-07-02 DIAGNOSIS — Z9229 Personal history of other drug therapy: Secondary | ICD-10-CM

## 2020-07-02 DIAGNOSIS — Q828 Other specified congenital malformations of skin: Secondary | ICD-10-CM | POA: Diagnosis not present

## 2020-07-02 DIAGNOSIS — M79674 Pain in right toe(s): Secondary | ICD-10-CM

## 2020-07-02 DIAGNOSIS — M79675 Pain in left toe(s): Secondary | ICD-10-CM

## 2020-07-06 NOTE — Progress Notes (Signed)
Subjective: Dalton Townsend is a 86 y.o. male patient seen today corn(s) b/l feet and painful mycotic toenails b/l that are difficult to trim. Pain interferes with ambulation. Aggravating factors include wearing enclosed shoe gear. Pain is relieved with periodic professional debridement.   Dalton. Townsend states some of his corns have improved.    His son in law is present during today's visit.   Patient Active Problem List   Diagnosis Date Noted  . 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 S/P femoral-femoral bypass surgery   . Aortic stenosis 04/11/2018  . Presence of permanent cardiac pacemaker   . PAF (paroxysmal atrial fibrillation) ([place])   . Carotid artery disease ([place])   . CAD (coronary artery disease)   . BPH (benign prostatic hyperplasia)   . GAD (generalized anxiety disorder)   . Acute on chronic congestive heart failure ([place]) 04/02/2018  . Severe aortic stenosis 01/04/2014  . HTN (hypertension) 06/24/2010  . Ulcerative colitis ([place]) 06/24/2010    Current Outpatient Medications on File Prior to Visit  Medication Sig Dispense Refill  . acetaminophen (TYLENOL) 500 MG tablet Take 1,000 mg by mouth at bedtime.    [name] amLODipine (NORVASC) 5 MG tablet Take 5 mg by mouth daily.    [name] amoxicillin (AMOXIL) 500 MG capsule Take 500 mg by mouth 4 (four) times daily as needed. Patient reports taking 4 capsules before any dental procedure.    [name] aspirin 81 MG chewable tablet Chew 1 tablet (81 mg total) by mouth daily.    [name] atorvastatin (LIPITOR) 20 MG tablet Take 20 mg by mouth every other day.     . balsalazide (COLAZAL) 750 MG capsule Take 2,250 mg by mouth 3 (three) times daily.    [name] ELIQUIS 5 MG TABS tablet TAKE 1 TABLET TWICE A DAY 180 tablet 2  . escitalopram (LEXAPRO) 10 MG tablet Take 10 mg by mouth daily.      . ferrous sulfate 325 (65 FE) MG tablet Take 325 mg by mouth daily.    . finasteride (PROSCAR) 5 MG tablet Take 5 mg by mouth See admin instructions.  Take one tablet (5 mg) by mouth every day except Wednesdays    . furosemide (LASIX) 40 MG tablet Take 2 tablets (80 mg total) by mouth daily. 60 tablet 9  . latanoprost (XALATAN) 0.005 % ophthalmic solution Place 1 drop into both eyes at bedtime.     [name] losartan (COZAAR) 100 MG tablet Take 1 tablet (100 mg total) by mouth daily.  3  . mesalamine (CANASA) 1000 MG suppository Place 1,000 mg rectally at bedtime as needed (ulcerative colitis).    . metoprolol succinate (TOPROL-XL) 50 MG 24 hr tablet Take 50 mg by mouth daily. Take with or immediately following a meal.    . Multiple Vitamin (MULTIVITAMIN WITH MINERALS) TABS tablet Take 1 tablet by mouth daily.     No current facility-administered medications on file prior to visit.    Allergies  Allergen Reactions  . No Known Allergies     Objective: Physical Exam  General: Well developed, nourished, no acute distress, awake, alert and oriented x 3  Vascular:  Dorsalis pedis artery 2/4 bilateral. Posterior tibial artery 2/4 bilateral. Skin temperature gradient warm to warm proximal to distal bilateral lower extremities. No varicosities b/l. Pedal hair present bilateral. Edema b/l LE.  Neurological: Gross sensation present via light touch bilateral.   Dermatological: Skin is warm, dry, and supple bilateral. Nails right hallux, 3-5 b/l are tender,  long, thick, and discolored with subungal debris. No interdigital macerations present bilateral.  Porokeratotic lesion noted distal tip of left third digit.  There is a porokeratotic lesion digit and b/l 3rd digit with tenderness to palpation. No erythema, no edema, no drainage, no flocculence.   Anonychia left hallux and b/l 2nd digits with evidence of permanent total nail avulsion. Nailbed(s) completely epithelialized and intact.  Musculoskeletal: No symptomatic bony deformities noted bilateral. Muscular strength within normal limits without pain on range of motion. No pain with calf compression  bilateral. Hammertoes 2-5 b/l.  Assessment and Plan:  Painful mycotic toenails right hallux and nails 3-5 b/l Porokeratosis b/l 3rd digit Pt on long term blood thinner  -Examined patient.  -Mechanically debrided and reduced mycotic nails x 7 with sterile nail nipper and dremel nail file without incident. -Painful porokeratotic lesion(s) b/l 3rd digits  pared and enucleated with sterile scalpel blade without incident.. -Patient to continue soft, supportive shoe gear daily. -Patient to report any pedal injuries to medical professional immediately. -Patient/POA to call should there be question/concern in the interim.  Return in about 3 months (around 10/02/2020).  Marzetta Board, DPM

## 2020-08-18 ENCOUNTER — Other Ambulatory Visit: Payer: Self-pay | Admitting: *Deleted

## 2020-08-18 NOTE — Patient Outreach (Signed)
[facility]) Care Management  08/18/2020  Dalton Townsend 21-Sep-1922 [id number]  RN Health Coach attempted follow up outreach call to patient.  Patient was unavailable. HIPPA compliance voicemail message left with return callback number.  Plan: RN will call patient again within 30 days.  [facility] Care Management [phone]

## 2020-08-26 ENCOUNTER — Ambulatory Visit (INDEPENDENT_AMBULATORY_CARE_PROVIDER_SITE_OTHER): Payer: Medicare Other

## 2020-08-26 DIAGNOSIS — I442 Atrioventricular block, complete: Secondary | ICD-10-CM

## 2020-08-27 LAB — CUP PACEART REMOTE DEVICE CHECK
Battery Impedance: 1851 Ohm
Battery Remaining Longevity: 35 mo
Battery Voltage: 2.75 V
Brady Statistic AP VP Percent: 67 %
Brady Statistic AP VS Percent: 1 %
Brady Statistic AS VP Percent: 30 %
Brady Statistic AS VS Percent: 2 %
Date Time Interrogation Session: 20211228151519
Implantable Lead Implant Date: 20110725
Implantable Lead Implant Date: 20110725
Implantable Lead Location: 753859
Implantable Lead Location: 753860
Implantable Lead Model: 5076
Implantable Lead Model: 5076
Implantable Pulse Generator Implant Date: 20110725
Lead Channel Impedance Value: 469 Ohm
Lead Channel Impedance Value: 549 Ohm
Lead Channel Pacing Threshold Amplitude: 0.375 V
Lead Channel Pacing Threshold Amplitude: 0.75 V
Lead Channel Pacing Threshold Pulse Width: 0.4 ms
Lead Channel Pacing Threshold Pulse Width: 0.4 ms
Lead Channel Setting Pacing Amplitude: 2 V
Lead Channel Setting Pacing Amplitude: 2.5 V
Lead Channel Setting Pacing Pulse Width: 0.4 ms
Lead Channel Setting Sensing Sensitivity: 2 mV

## 2020-09-02 DIAGNOSIS — Z0000 Encounter for general adult medical examination without abnormal findings: Secondary | ICD-10-CM | POA: Diagnosis not present

## 2020-09-02 DIAGNOSIS — E7800 Pure hypercholesterolemia, unspecified: Secondary | ICD-10-CM | POA: Diagnosis not present

## 2020-09-02 DIAGNOSIS — Q253 Supravalvular aortic stenosis: Secondary | ICD-10-CM | POA: Diagnosis not present

## 2020-09-02 DIAGNOSIS — I700 Atherosclerosis of aorta: Secondary | ICD-10-CM | POA: Diagnosis not present

## 2020-09-02 DIAGNOSIS — D6869 Other thrombophilia: Secondary | ICD-10-CM | POA: Diagnosis not present

## 2020-09-02 DIAGNOSIS — Z1389 Encounter for screening for other disorder: Secondary | ICD-10-CM | POA: Diagnosis not present

## 2020-09-02 DIAGNOSIS — D692 Other nonthrombocytopenic purpura: Secondary | ICD-10-CM | POA: Diagnosis not present

## 2020-09-02 DIAGNOSIS — I10 Essential (primary) hypertension: Secondary | ICD-10-CM | POA: Diagnosis not present

## 2020-09-02 DIAGNOSIS — Z79899 Other long term (current) drug therapy: Secondary | ICD-10-CM | POA: Diagnosis not present

## 2020-09-02 DIAGNOSIS — I5030 Unspecified diastolic (congestive) heart failure: Secondary | ICD-10-CM | POA: Diagnosis not present

## 2020-09-02 DIAGNOSIS — M542 Cervicalgia: Secondary | ICD-10-CM | POA: Diagnosis not present

## 2020-09-02 DIAGNOSIS — I480 Paroxysmal atrial fibrillation: Secondary | ICD-10-CM | POA: Diagnosis not present

## 2020-09-09 NOTE — Progress Notes (Signed)
Remote pacemaker transmission.   

## 2020-09-18 DIAGNOSIS — M542 Cervicalgia: Secondary | ICD-10-CM | POA: Diagnosis not present

## 2020-09-24 DIAGNOSIS — H401131 Primary open-angle glaucoma, bilateral, mild stage: Secondary | ICD-10-CM | POA: Diagnosis not present

## 2020-09-24 DIAGNOSIS — H5340 Unspecified visual field defects: Secondary | ICD-10-CM | POA: Diagnosis not present

## 2020-09-26 ENCOUNTER — Other Ambulatory Visit: Payer: Self-pay | Admitting: *Deleted

## 2020-09-27 NOTE — Patient Instructions (Signed)
Goals Addressed            This Visit's Progress   . (THN)Track and Manage Activity and Exertion-Heart Failure       Timeframe:  Long-Range Goal Priority:  Medium Start Date: 18299371                            Expected End Date:       69678938                Follow Up Date  [id number]   - avoid heavy exercise on very hot days - drink water to stay hydrated during exercise - follow activity or exercise plan - make an activity or exercise plan - meet with physical therapist - track symptoms during activity in diary    Why is this important?    Exercising is very important when managing your heart failure.   It will help your heart get stronger.    Notes:  09/26/2020 Patient meets with Physical therapy once a week    . (THN)Track and Manage Fluids and Swelling-Heart Failure       Timeframe:  Long-Range Goal Priority:  Medium Start Date:     [id number]                        Expected End Date:    [id number]                   Follow Up Date [id number]   - call office if I gain more than 2 pounds in one day or 5 pounds in one week - track weight in diary - use salt in moderation - watch for swelling in feet, ankles and legs every day - weigh myself daily    Why is this important?    It is important to check your weight daily and watch how much salt and liquids you have.   It will help you to manage your heart failure.    Notes:  09/26/2020 Patient weigh every other day. He takes his diuretics as per ordered    . (THN)Track and Manage Symptoms-Heart Failure       Timeframe:  Long-Range Goal Priority:  High Start Date:  [id number]                           Expected End Date:     32671245              Follow Up Date 80998338   - develop a rescue plan - eat more whole grains, fruits and vegetables, lean meats and healthy fats - follow rescue plan if symptoms flare-up - know when to call the doctor - track symptoms and what helps feel better or worse    Why is this  important?    You will be able to handle your symptoms better if you keep track of them.   Making some simple changes to your lifestyle will help.   Eating healthy is one thing you can do to take good care of yourself.    Notes:

## 2020-09-27 NOTE — Patient Outreach (Signed)
[facility]) Care Management  [place]  [phone]  Dalton Townsend 07/01/1923 [id number]  RN Health Coach telephone call to patient.  Hipaa compliance verified. Per patient He is doing good. Patient stated that weighs every other day. He takes his medications as per ordered. He is watching for swelling in his legs and ankles. No swelling at this time. Patient has been having pain in neck and physical therapy comes once a week. Patient is doing a home exercise routine of walking. Patient has agreed to further outreach calls.   Encounter Medications:  Outpatient Encounter Medications as of 09/26/2020  Medication Sig  . acetaminophen (TYLENOL) 500 MG tablet Take 1,000 mg by mouth at bedtime.  [name] amLODipine (NORVASC) 5 MG tablet Take 5 mg by mouth daily.  [name] amoxicillin (AMOXIL) 500 MG capsule Take 500 mg by mouth 4 (four) times daily as needed. Patient reports taking 4 capsules before any dental procedure.  [name] aspirin 81 MG chewable tablet Chew 1 tablet (81 mg total) by mouth daily.  [name] atorvastatin (LIPITOR) 20 MG tablet Take 20 mg by mouth every other day.   . balsalazide (COLAZAL) 750 MG capsule Take 2,250 mg by mouth 3 (three) times daily.  [name] ELIQUIS 5 MG TABS tablet TAKE 1 TABLET TWICE A DAY  . escitalopram (LEXAPRO) 10 MG tablet Take 10 mg by mouth daily.    . ferrous sulfate 325 (65 FE) MG tablet Take 325 mg by mouth daily.  . finasteride (PROSCAR) 5 MG tablet Take 5 mg by mouth See admin instructions. Take one tablet (5 mg) by mouth every day except Wednesdays  . furosemide (LASIX) 40 MG tablet Take 2 tablets (80 mg total) by mouth daily.  [name] latanoprost (XALATAN) 0.005 % ophthalmic solution Place 1 drop into both eyes at bedtime.   [name] losartan (COZAAR) 100 MG tablet Take 1 tablet (100 mg total) by mouth daily.  . mesalamine (CANASA) 1000 MG suppository Place 1,000 mg rectally at bedtime as needed (ulcerative colitis).  . metoprolol succinate (TOPROL-XL) 50 MG 24  hr tablet Take 50 mg by mouth daily. Take with or immediately following a meal.  . Multiple Vitamin (MULTIVITAMIN WITH MINERALS) TABS tablet Take 1 tablet by mouth daily.   No facility-administered encounter medications on file as of 09/26/2020.    Functional Status:  No flowsheet data found.  Fall/Depression Screening: Fall Risk  09/26/2020 12/06/2019 09/07/2019  Falls in the past year? 1 1 1   Number falls in past yr: 0 0 0  Injury with Fall? 1 1 1   Risk for fall due to : History of fall(s);Impaired balance/gait;Impaired mobility History of fall(s);Impaired balance/gait;Impaired mobility History of fall(s);Impaired balance/gait;Impaired mobility  Follow up Falls evaluation completed Falls evaluation completed Falls evaluation completed;Falls prevention discussed   PHQ 2/9 Scores 12/06/2019  PHQ - 2 Score 0    Assessment:  Goals Addressed            This Visit's Progress   . (THN)Track and Manage Activity and Exertion-Heart Failure       Timeframe:  Long-Range Goal Priority:  Medium Start Date: 71062694                            Expected End Date:       [id number]                Follow Up Date  [id number]   - avoid heavy exercise on very  hot days - drink water to stay hydrated during exercise - follow activity or exercise plan - make an activity or exercise plan - meet with physical therapist - track symptoms during activity in diary    Why is this important?    Exercising is very important when managing your heart failure.   It will help your heart get stronger.    Notes:  09/26/2020 Patient meets with Physical therapy once a week    . (THN)Track and Manage Fluids and Swelling-Heart Failure       Timeframe:  Long-Range Goal Priority:  Medium Start Date:     [id number]                        Expected End Date:    29021115                   Follow Up Date [id number]   - call office if I gain more than 2 pounds in one day or 5 pounds in one week - track weight in diary -  use salt in moderation - watch for swelling in feet, ankles and legs every day - weigh myself daily    Why is this important?    It is important to check your weight daily and watch how much salt and liquids you have.   It will help you to manage your heart failure.    Notes:  09/26/2020 Patient weigh every other day. He takes his diuretics as per ordered    . (THN)Track and Manage Symptoms-Heart Failure       Timeframe:  Long-Range Goal Priority:  High Start Date:  36122449                           Expected End Date:     75300511              Follow Up Date 02111735   - develop a rescue plan - eat more whole grains, fruits and vegetables, lean meats and healthy fats - follow rescue plan if symptoms flare-up - know when to call the doctor - track symptoms and what helps feel better or worse    Why is this important?    You will be able to handle your symptoms better if you keep track of them.   Making some simple changes to your lifestyle will help.   Eating healthy is one thing you can do to take good care of yourself.    Notes:        Plan:  Follow-up:  Patient agrees to Care Plan and Follow-up. Patient will continue weighing every other day Provided educational material on F.A.S.T. acronym picture sheet for stroke Provided calendar book for documenting weights RN sent update assessment to PCP RN will follow up within the month of April Appointment with Dr Marlou Porch Cardiology [phone] Appointment scheduled with Dr Consuelo Pandy Podiatry 10/13/2020  [place] Management [phone]

## 2020-10-02 DIAGNOSIS — M542 Cervicalgia: Secondary | ICD-10-CM | POA: Diagnosis not present

## 2020-10-09 DIAGNOSIS — M542 Cervicalgia: Secondary | ICD-10-CM | POA: Diagnosis not present

## 2020-10-13 ENCOUNTER — Ambulatory Visit (INDEPENDENT_AMBULATORY_CARE_PROVIDER_SITE_OTHER): Payer: Medicare Other | Admitting: Podiatry

## 2020-10-13 ENCOUNTER — Other Ambulatory Visit: Payer: Self-pay

## 2020-10-13 DIAGNOSIS — Q828 Other specified congenital malformations of skin: Secondary | ICD-10-CM

## 2020-10-13 DIAGNOSIS — M79675 Pain in left toe(s): Secondary | ICD-10-CM | POA: Diagnosis not present

## 2020-10-13 DIAGNOSIS — M79674 Pain in right toe(s): Secondary | ICD-10-CM

## 2020-10-13 DIAGNOSIS — B351 Tinea unguium: Secondary | ICD-10-CM

## 2020-10-13 DIAGNOSIS — Z9229 Personal history of other drug therapy: Secondary | ICD-10-CM

## 2020-10-13 NOTE — Progress Notes (Signed)
Subjective: Dalton Townsend is a 87 y.o. male patient seen today corn(s) b/l feet and painful mycotic toenails b/l that are difficult to trim. Pain interferes with ambulation. Aggravating factors include wearing enclosed shoe gear. Pain is relieved with periodic professional debridement. He relates no new pedal concerns on today's visit.  His son in law is present during today's visit.  PCP is Dr. Lajean Manes. Last visit was 09/02/2020.  Allergies  Allergen Reactions  . No Known Allergies    Objective: Physical Exam  General: Well developed, nourished, no acute distress, awake, alert and oriented x 3  Vascular:  Dorsalis pedis artery 2/4 bilateral. Posterior tibial artery 2/4 bilateral. Skin temperature gradient warm to cool proximal to distal bilateral lower extremities. No varicosities b/l. Pedal hair diminished b/l/. Edema b/l LE.  Neurological: Gross sensation present via light touch bilateral.   Dermatological: Pedal skin thin and atrophic b/l.  Toenails right hallux, 3-5 b/l are tender, long, thick, and discolored with subungal debris. No interdigital macerations present bilateral.  Minimal porokeratotic lesion digit and b/l 3rd digit with tenderness to palpation. No erythema, no edema, no drainage, no flocculence.   Anonychia left hallux and b/l 2nd digits with evidence of permanent total nail avulsion. Nailbed(s) completely epithelialized and intact.  Musculoskeletal: No symptomatic bony deformities noted bilateral. Muscular strength within normal limits without pain on range of motion. No pain with calf compression bilateral. Hammertoes 2-5 b/l.  Assessment and Plan:  Painful mycotic toenails right hallux and nails 3-5 b/l Porokeratosis b/l 3rd digit  -Examined patient.  -Mechanically debrided and reduced mycotic nails x 7 with sterile nail nipper and dremel nail file without incident. -Continue offloading b/l 3rd digits daily. -Patient to continue soft, supportive  shoe gear daily. -Patient to report any pedal injuries to medical professional immediately. -Patient/POA to call should there be question/concern in the interim.  Return in about 3 months (around 01/10/2021).  Marzetta Board, DPM

## 2020-10-14 DIAGNOSIS — M542 Cervicalgia: Secondary | ICD-10-CM | POA: Diagnosis not present

## 2020-10-19 ENCOUNTER — Encounter: Payer: Self-pay | Admitting: Podiatry

## 2020-10-23 DIAGNOSIS — M542 Cervicalgia: Secondary | ICD-10-CM | POA: Diagnosis not present

## 2020-10-29 ENCOUNTER — Ambulatory Visit: Payer: Medicare Other | Admitting: Cardiology

## 2020-10-30 DIAGNOSIS — M542 Cervicalgia: Secondary | ICD-10-CM | POA: Diagnosis not present

## 2020-10-31 ENCOUNTER — Other Ambulatory Visit: Payer: Self-pay

## 2020-10-31 ENCOUNTER — Ambulatory Visit (INDEPENDENT_AMBULATORY_CARE_PROVIDER_SITE_OTHER): Payer: Medicare Other | Admitting: Cardiology

## 2020-10-31 ENCOUNTER — Encounter: Payer: Self-pay | Admitting: Cardiology

## 2020-10-31 VITALS — BP 124/60 | HR 61 | Ht 71.0 in | Wt 180.0 lb

## 2020-10-31 DIAGNOSIS — Z952 Presence of prosthetic heart valve: Secondary | ICD-10-CM

## 2020-10-31 DIAGNOSIS — I5032 Chronic diastolic (congestive) heart failure: Secondary | ICD-10-CM

## 2020-10-31 DIAGNOSIS — I480 Paroxysmal atrial fibrillation: Secondary | ICD-10-CM | POA: Diagnosis not present

## 2020-10-31 LAB — BASIC METABOLIC PANEL
BUN/Creatinine Ratio: 18 (ref 10–24)
BUN: 23 mg/dL (ref 10–36)
CO2: 21 mmol/L (ref 20–29)
Calcium: 9.6 mg/dL (ref 8.6–10.2)
Chloride: 103 mmol/L (ref 96–106)
Creatinine, Ser: 1.28 mg/dL — ABNORMAL HIGH (ref 0.76–1.27)
Glucose: 84 mg/dL (ref 65–99)
Potassium: 4.2 mmol/L (ref 3.5–5.2)
Sodium: 139 mmol/L (ref 134–144)
eGFR: 52 mL/min/{1.73_m2} — ABNORMAL LOW (ref >59)

## 2020-10-31 LAB — CBC
Hematocrit: 33.5 % — ABNORMAL LOW (ref 37.5–51.0)
Hemoglobin: 11 g/dL — ABNORMAL LOW (ref 13.0–17.7)
MCH: 30 pg (ref 26.6–33.0)
MCHC: 32.8 g/dL (ref 31.5–35.7)
MCV: 91 fL (ref 79–97)
Platelets: 171 10*3/uL (ref 150–450)
RBC: 3.67 x10E6/uL — ABNORMAL LOW (ref 4.14–5.80)
RDW: 14.6 % (ref 11.6–15.4)
WBC: 6.4 10*3/uL (ref 3.4–10.8)

## 2020-10-31 NOTE — Patient Instructions (Signed)
Medication Instructions:  Your physician recommends that you continue on your current medications as directed. Please refer to the Current Medication list given to you today. *If you need a refill on your cardiac medications before your next appointment, please call your pharmacy*   Lab Work: Today: CBC, BMET If you have labs (blood work) drawn today and your tests are completely normal, you will receive your results only by: [name] MyChart Message (if you have MyChart) OR . A paper copy in the mail If you have any lab test that is abnormal or we need to change your treatment, we will call you to review the results.   Follow-Up: At [facility], you and your health needs are our priority.  As part of our continuing mission to provide you with exceptional heart care, we have created designated Provider Care Teams.  These Care Teams include your primary Cardiologist (physician) and Advanced Practice Providers (APPs -  Physician Assistants and Nurse Practitioners) who all work together to provide you with the care you need, when you need it.  We recommend signing up for the patient portal called "MyChart".  Sign up information is provided on this After Visit Summary.  MyChart is used to connect with patients for Virtual Visits (Telemedicine).  Patients are able to view lab/test results, encounter notes, upcoming appointments, etc.  Non-urgent messages can be sent to your provider as well.   To learn more about what you can do with MyChart, go to [email or web address].    Your next appointment:   6 month(s)  The format for your next appointment:   In Person  Provider:   You may see Candee Furbish, MD or one of the following Advanced Practice Providers on your designated Care Team:    Kathyrn Drown, NP

## 2020-10-31 NOTE — Progress Notes (Signed)
Cardiology Office Note:    Date:  10/31/2020   ID:  Dalton Townsend, DOB [birth year], MRN [id number]  PCP:  Lajean Manes, MD   [place]  Cardiologist:  Candee Furbish, MD  Advanced Practice Provider:  No care team member to display Electrophysiologist:  None       Referring MD: Lajean Manes, MD     History of Present Illness:    Dalton Townsend is a 87 y.o. male here for follow-up TAVR 2019 transapical approach, pacemaker paroxysmal atrial relation peripheral arterial disease with emergent bypass of lower extremity following transfemoral TAVR attempt.  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  Doing quite well.  Has some urinary frequency.  No real chest pain.  Breathing is stable.    Past Medical History:  Diagnosis Date  . Acute on chronic congestive heart failure ([place]) 04/02/2018  . Aortic stenosis 04/11/2018  . BPH (benign prostatic hyperplasia)   . CAD (coronary artery disease)    a. s/p CABG x6V in 1999, cath 03/2018 showed patent 6/6 bypass grafts  . Carotid artery disease ([place])    a. s/p R CEA, 80-99% restenosis of RICA, to be address after TAVR  . Dyslipidemia   . Essential hypertension   . GAD (generalized anxiety disorder)   . HTN (hypertension) 06/24/2010   Qualifier: Diagnosis of  By: Johnnye Sima MD, Dellis Filbert    . Intestinal infection due to Clostridium difficile 03/2010  . PAF (paroxysmal atrial fibrillation) (HCC)    a. on Eliquis  . Presence of permanent cardiac pacemaker   . PVD (peripheral vascular disease) ([place])   . S/P femoral-femoral bypass surgery    a. emergent fem fem bypass due to iliac complication during first attempt TAVR placement on 04/11/18 by Dr. Scot Dock  . S/P TAVR (transcatheter aortic valve replacement)    a. 04/18/18: s/p succesful TAVR with a [place] 3 THV  . Severe aortic stenosis   . Skin cancer   . Ulcerative colitis   . Ulcerative colitis ([place]) 06/24/2010   Qualifier: Diagnosis of  By: Johnnye Sima  MD, Dellis Filbert      Past Surgical History:  Procedure Laterality Date  . BALLOON AORTIC VALVE VALVULOPLASTY N/A 04/11/2018   Procedure: BALLOON AORTIC VALVE VALVULOPLASTY.  RIGHT EXTERNAL ILIAC ARTERY OVERSEW.;  Surgeon: Sherren Mocha, MD;  Location: [place];  Service: Open Heart Surgery;  Laterality: N/A;  . CARDIAC CATHETERIZATION  1999  . CAROTID ENDARTERECTOMY Right 2004  . CATARACT EXTRACTION, BILATERAL  2011  . CORONARY ARTERY BYPASS GRAFT  04/1998   3 vessel CAD  . ENDARTERECTOMY FEMORAL Right 04/11/2018   Procedure: ENDARTERECTOMY FEMORAL;  Surgeon: Sherren Mocha, MD;  Location: [facility];  Service: Open Heart Surgery;  Laterality: Right;  . FEMORAL-FEMORAL BYPASS GRAFT N/A 04/11/2018   Procedure: LEFT TO RIGHT BYPASS GRAFT FEMORAL-FEMORAL ARTERY;  Surgeon: Sherren Mocha, MD;  Location: [place];  Service: Open Heart Surgery;  Laterality: N/A;  . INCISION AND DRAINAGE OF WOUND Left 05/04/2016   Procedure: SURGICAL PREP FOR GRAFTING LEFT LEG WITH THERA SKIN APPLICATION;  Surgeon: Irene Limbo, MD;  Location: [place];  Service: Plastics;  Laterality: Left;  . INTRAOPERATIVE ARTERIOGRAM  06/2000  . INTRAOPERATIVE TRANSTHORACIC ECHOCARDIOGRAM  04/11/2018   Procedure: INTRAOPERATIVE TRANSTHORACIC ECHOCARDIOGRAM;  Surgeon: Sherren Mocha, MD;  [facility]: [facility];  Service: Open Heart Surgery;;  . PACEMAKER INSERTION  03/25/2010  . RIGHT/LEFT HEART CATH AND CORONARY/GRAFT ANGIOGRAPHY N/A 04/05/2018   Procedure: RIGHT/LEFT HEART CATH AND  CORONARY/GRAFT ANGIOGRAPHY;  Surgeon: Burnell Blanks, MD;  Location: [place] CV LAB;  Service: Cardiovascular;  Laterality: N/A;  . TEE WITHOUT CARDIOVERSION N/A 04/18/2018   Procedure: TRANSESOPHAGEAL ECHOCARDIOGRAM (TEE);  Surgeon: Sherren Mocha, MD;  Location: [place];  Service: Open Heart Surgery;  Laterality: N/A;  . TOTAL KNEE ARTHROPLASTY  07/16/09  . TRANSCATHETER AORTIC VALVE REPLACEMENT, TRANSAPICAL N/A 04/18/2018   Procedure: TRANSCATHETER AORTIC  VALVE REPLACEMENT, TRANSAPICAL. 63m EDWARDS SAPIEN 3 TRANSCATHETER HEART VALVE.;  Surgeon: CSherren Mocha MD;  [place]  Service: Open Heart Surgery;  Laterality: N/A;  . TRANSURETHRAL RESECTION OF PROSTATE  1998    Current Medications: Current Meds  Medication Sig  . acetaminophen (TYLENOL) 500 MG tablet Take 1,000 mg by mouth at bedtime.  .Marland KitchenamLODipine (NORVASC) 5 MG tablet Take 5 mg by mouth daily.  .Marland Kitchenamoxicillin (AMOXIL) 500 MG capsule Take 500 mg by mouth 4 (four) times daily as needed. Patient reports taking 4 capsules before any dental procedure.  .Marland Kitchenaspirin 81 MG chewable tablet Chew 1 tablet (81 mg total) by mouth daily.  .Marland Kitchenatorvastatin (LIPITOR) 20 MG tablet Take 20 mg by mouth every other day.   . balsalazide (COLAZAL) 750 MG capsule Take 2,250 mg by mouth 3 (three) times daily.  .Marland KitchenELIQUIS 5 MG TABS tablet TAKE 1 TABLET TWICE A DAY  . escitalopram (LEXAPRO) 10 MG tablet Take 10 mg by mouth daily.  . ferrous sulfate 325 (65 FE) MG tablet Take 325 mg by mouth daily.  . finasteride (PROSCAR) 5 MG tablet Take 5 mg by mouth See admin instructions. Take one tablet (5 mg) by mouth every day except Wednesdays  . furosemide (LASIX) 40 MG tablet Take 2 tablets (80 mg total) by mouth daily.  .Marland Kitchenlatanoprost (XALATAN) 0.005 % ophthalmic solution Place 1 drop into both eyes at bedtime.   .Marland Kitchenlosartan (COZAAR) 100 MG tablet Take 1 tablet (100 mg total) by mouth daily.  . mesalamine (CANASA) 1000 MG suppository Place 1,000 mg rectally at bedtime as needed (ulcerative colitis).  . metoprolol succinate (TOPROL-XL) 50 MG 24 hr tablet Take 50 mg by mouth daily. Take with or immediately following a meal.  . Multiple Vitamin (MULTIVITAMIN WITH MINERALS) TABS tablet Take 1 tablet by mouth daily.     Allergies:   No known allergies   Social History   Socioeconomic History  . Marital status: Married    Spouse name: Not on file  . Number of children: 3  . Years of education: Not on file  .  Highest education level: Not on file  Occupational History  . Occupation: retired  Tobacco Use  . Smoking status: Former Smoker    Years: 10.00    Types: Cigarettes    Quit date: 08/30/1952    Years since quitting: 68.2  . Smokeless tobacco: Former UNetwork engineer . Vaping Use: Never used  Substance and Sexual Activity  . Alcohol use: No  . Drug use: No  . Sexual activity: Not on file  Other Topics Concern  . Not on file  Social History Narrative  . Not on file   Social Determinants of Health   Financial Resource Strain: Not on file  Food Insecurity: No Food Insecurity  . Worried About RCharity fundraiserin the Last Year: Never true  . Ran Out of Food in the Last Year: Never true  Transportation Needs: No Transportation Needs  . Lack of Transportation (Medical): No  . Lack  of Transportation (Non-Medical): No  Physical Activity: Not on file  Stress: Not on file  Social Connections: Not on file     Family History: The patient's family history includes Cancer in an other family member; Congestive Heart Failure in his mother; Heart failure in an other family member; Leukemia in his father and another family member.  ROS:   Please see the history of present illness.     All other systems reviewed and are negative.  EKGs/Labs/Other Studies Reviewed:    The following studies were reviewed today: Echocardiogram 05/2019 -EF 60 to 65% 26 mm Edwards CPN bioprosthetic aortic valve reassuring.  Right and left heart catheterization 2019 -Severe triple-vessel CAD status post 6 vessel CABG with 6 patent grafts.  EKG:  EKG is  ordered today.  The ekg ordered today demonstrates paced 61.  Recent Labs: 02/13/2020: ALT 6 04/23/2020: NT-Pro BNP 7,476 07/01/2020: BUN 19; Creatinine, Ser 1.06; Hemoglobin 10.7; Platelets 158; Potassium 4.2; Sodium 139  Recent Lipid Panel No results found for: CHOL, TRIG, HDL, CHOLHDL, VLDL, LDLCALC, LDLDIRECT   Risk Assessment/Calculations:       Physical Exam:    VS:  BP 124/60 (BP Location: Left Arm, Patient Position: Sitting, Cuff Size: Normal)   Pulse 61   Ht 5' 11"  (1.803 m)   Wt 180 lb (81.6 kg)   SpO2 95%   BMI 25.10 kg/m     Wt Readings from Last 3 Encounters:  10/31/20 180 lb (81.6 kg)  07/01/20 186 lb 6.4 oz (84.6 kg)  04/23/20 187 lb 12.8 oz (85.2 kg)     GEN:  Well nourished, well developed in no acute distress HEENT: Normal NECK: No JVD; No carotid bruits LYMPHATICS: No lymphadenopathy CARDIAC: RRR, soft S murmur, rubs, gallops RESPIRATORY:  Clear to auscultation without rales, wheezing or rhonchi  ABDOMEN: Soft, non-tender, non-distended MUSCULOSKELETAL:  No edema; No deformity  SKIN: Warm and dry NEUROLOGIC:  Alert and oriented x 3 PSYCHIATRIC:  Normal affect   ASSESSMENT:    1. PAF (paroxysmal atrial fibrillation) ([place])   2. Chronic diastolic CHF (congestive heart failure) ([place])   3. S/P TAVR (transcatheter aortic valve replacement)    PLAN:    In order of problems listed above:  TAVR aortic stenosis -Stable on echocardiogram as reviewed above.  Continuing with aspirin 81.  No excessive bleeding.  Rare nosebleed.  Persistent atrial fibrillation -Good rate control.  Has pacemaker in place for bradycardia.  Also on Eliquis 5 mg.  Coronary artery disease -Remote CABG.  Excellent 6 grafts are patent.  No anginal symptoms.  Goal-directed medical therapy.  Anemia -On iron supplementation.  Checking a CBC.  Chronically low.  Last hemoglobin 10.8.  Pacemaker -Functioning well.  Carotid artery disease -Prior right carotid endarterectomy by Dr. Scot Dock.       Medication Adjustments/Labs and Tests Ordered: Current medicines are reviewed at length with the patient today.  Concerns regarding medicines are outlined above.  Orders Placed This Encounter  Procedures  . Basic metabolic panel  . CBC  . EKG 12-Lead   No orders of the defined types were placed in this  encounter.   Patient Instructions  Medication Instructions:  Your physician recommends that you continue on your current medications as directed. Please refer to the Current Medication list given to you today. *If you need a refill on your cardiac medications before your next appointment, please call your pharmacy*   Lab Work: Today: CBC, BMET If you have labs (blood work) drawn today  and your tests are completely normal, you will receive your results only by: [name] MyChart Message (if you have MyChart) OR . A paper copy in the mail If you have any lab test that is abnormal or we need to change your treatment, we will call you to review the results.   Follow-Up: At [facility], you and your health needs are our priority.  As part of our continuing mission to provide you with exceptional heart care, we have created designated Provider Care Teams.  These Care Teams include your primary Cardiologist (physician) and Advanced Practice Providers (APPs -  Physician Assistants and Nurse Practitioners) who all work together to provide you with the care you need, when you need it.  We recommend signing up for the patient portal called "MyChart".  Sign up information is provided on this After Visit Summary.  MyChart is used to connect with patients for Virtual Visits (Telemedicine).  Patients are able to view lab/test results, encounter notes, upcoming appointments, etc.  Non-urgent messages can be sent to your provider as well.   To learn more about what you can do with MyChart, go to [email or web address].    Your next appointment:   6 month(s)  The format for your next appointment:   In Person  Provider:   You may see Candee Furbish, MD or one of the following Advanced Practice Providers on your designated Care Team:    Kathyrn Drown, NP        Signed, Candee Furbish, MD  10/31/2020 12:05 PM    [place]

## 2020-11-06 DIAGNOSIS — M542 Cervicalgia: Secondary | ICD-10-CM | POA: Diagnosis not present

## 2020-11-07 ENCOUNTER — Telehealth: Payer: Self-pay

## 2020-11-07 NOTE — Telephone Encounter (Signed)
-----   Message from Liliane Shi, [place] sent at 11/06/2020  4:36 PM EST ----- Regarding: Call back I spoke to his daughter today (she works with [employer]). The patient has a question about his Lasix dose. It was to be reduced at his visit last week but he was already taking the lower dose. He has called in to the office but has not heard anything.  Can someone look in his chart and give him a call back? Thanks [employer]

## 2020-11-07 NOTE — Telephone Encounter (Signed)
Spoke with pt and to discuss pt's question re: Lasix.  Pt states he received a call yesterday form someone with [facility] and advised per Dr Marlou Porch to continue on Lasix 22m daily.  This is call from 11/06/2020 is noted under the lab result note.  Pt thanked RTherapist, sportsfor the call.

## 2020-11-20 DIAGNOSIS — M542 Cervicalgia: Secondary | ICD-10-CM | POA: Diagnosis not present

## 2020-11-25 ENCOUNTER — Ambulatory Visit (INDEPENDENT_AMBULATORY_CARE_PROVIDER_SITE_OTHER): Payer: Medicare Other

## 2020-11-25 DIAGNOSIS — I442 Atrioventricular block, complete: Secondary | ICD-10-CM | POA: Diagnosis not present

## 2020-11-26 LAB — CUP PACEART REMOTE DEVICE CHECK
Battery Impedance: 1907 Ohm
Battery Remaining Longevity: 34 mo
Battery Voltage: 2.75 V
Brady Statistic AP VP Percent: 66 %
Brady Statistic AP VS Percent: 1 %
Brady Statistic AS VP Percent: 30 %
Brady Statistic AS VS Percent: 3 %
Date Time Interrogation Session: 20220329143422
Implantable Lead Implant Date: 20110725
Implantable Lead Implant Date: 20110725
Implantable Lead Location: 753859
Implantable Lead Location: 753860
Implantable Lead Model: 5076
Implantable Lead Model: 5076
Implantable Pulse Generator Implant Date: 20110725
Lead Channel Impedance Value: 490 Ohm
Lead Channel Impedance Value: 530 Ohm
Lead Channel Pacing Threshold Amplitude: 0.375 V
Lead Channel Pacing Threshold Amplitude: 0.625 V
Lead Channel Pacing Threshold Pulse Width: 0.4 ms
Lead Channel Pacing Threshold Pulse Width: 0.4 ms
Lead Channel Setting Pacing Amplitude: 2 V
Lead Channel Setting Pacing Amplitude: 2.5 V
Lead Channel Setting Pacing Pulse Width: 0.4 ms
Lead Channel Setting Sensing Sensitivity: 2 mV

## 2020-12-03 DIAGNOSIS — M542 Cervicalgia: Secondary | ICD-10-CM | POA: Diagnosis not present

## 2020-12-09 NOTE — Progress Notes (Signed)
Remote pacemaker transmission.   

## 2020-12-17 DIAGNOSIS — M542 Cervicalgia: Secondary | ICD-10-CM | POA: Diagnosis not present

## 2020-12-25 ENCOUNTER — Other Ambulatory Visit: Payer: Self-pay | Admitting: *Deleted

## 2020-12-25 NOTE — Patient Outreach (Signed)
[facility]) Care Management  12/25/2020  Dalton Townsend 24-Feb-1923 [id number]   RN Health Coach attempted follow up outreach call to patient.  Patient was unavailable. HIPPA compliance voicemail message left with return callback number.  Plan: RN will call patient again within 30 days.  [place] Care Management [phone]

## 2021-01-20 ENCOUNTER — Other Ambulatory Visit: Payer: Self-pay | Admitting: *Deleted

## 2021-01-20 NOTE — Patient Outreach (Signed)
[facility]) Care Management  01/20/2021  Dalton Townsend 26-Mar-1923 [id number]   RN Health Coach attempted follow up outreach call to patient. Per [place] patient wife he is unavailable. She requested call back at another time.   Plan: RN will call patient again within 30 days.  [place] Care Management [phone]

## 2021-01-21 ENCOUNTER — Ambulatory Visit: Payer: Medicare Other | Admitting: Podiatry

## 2021-01-22 ENCOUNTER — Inpatient Hospital Stay (HOSPITAL_COMMUNITY)
Admission: EM | Admit: 2021-01-22 | Discharge: 2021-01-29 | DRG: 086 | Disposition: A | Discharge status: Skilled Nursing Facility | Payer: Medicare Other | Attending: Internal Medicine | Admitting: Internal Medicine

## 2021-01-22 ENCOUNTER — Other Ambulatory Visit: Payer: Self-pay

## 2021-01-22 DIAGNOSIS — Z8249 Family history of ischemic heart disease and other diseases of the circulatory system: Secondary | ICD-10-CM

## 2021-01-22 DIAGNOSIS — W19XXXA Unspecified fall, initial encounter: Secondary | ICD-10-CM | POA: Diagnosis not present

## 2021-01-22 DIAGNOSIS — R531 Weakness: Secondary | ICD-10-CM

## 2021-01-22 DIAGNOSIS — I10 Essential (primary) hypertension: Secondary | ICD-10-CM | POA: Diagnosis present

## 2021-01-22 DIAGNOSIS — Z96659 Presence of unspecified artificial knee joint: Secondary | ICD-10-CM | POA: Diagnosis present

## 2021-01-22 DIAGNOSIS — D631 Anemia in chronic kidney disease: Secondary | ICD-10-CM | POA: Diagnosis present

## 2021-01-22 DIAGNOSIS — W010XXA Fall on same level from slipping, tripping and stumbling without subsequent striking against object, initial encounter: Secondary | ICD-10-CM | POA: Diagnosis present

## 2021-01-22 DIAGNOSIS — Z951 Presence of aortocoronary bypass graft: Secondary | ICD-10-CM

## 2021-01-22 DIAGNOSIS — Z515 Encounter for palliative care: Secondary | ICD-10-CM

## 2021-01-22 DIAGNOSIS — Z87891 Personal history of nicotine dependence: Secondary | ICD-10-CM

## 2021-01-22 DIAGNOSIS — Z7901 Long term (current) use of anticoagulants: Secondary | ICD-10-CM

## 2021-01-22 DIAGNOSIS — K5190 Ulcerative colitis, unspecified, without complications: Secondary | ICD-10-CM | POA: Diagnosis present

## 2021-01-22 DIAGNOSIS — R0602 Shortness of breath: Secondary | ICD-10-CM | POA: Diagnosis not present

## 2021-01-22 DIAGNOSIS — I480 Paroxysmal atrial fibrillation: Secondary | ICD-10-CM | POA: Diagnosis present

## 2021-01-22 DIAGNOSIS — K8020 Calculus of gallbladder without cholecystitis without obstruction: Secondary | ICD-10-CM | POA: Diagnosis not present

## 2021-01-22 DIAGNOSIS — A0472 Enterocolitis due to Clostridium difficile, not specified as recurrent: Secondary | ICD-10-CM | POA: Diagnosis present

## 2021-01-22 DIAGNOSIS — S065XAA Traumatic subdural hemorrhage with loss of consciousness status unknown, initial encounter: Secondary | ICD-10-CM

## 2021-01-22 DIAGNOSIS — F411 Generalized anxiety disorder: Secondary | ICD-10-CM | POA: Diagnosis present

## 2021-01-22 DIAGNOSIS — I5032 Chronic diastolic (congestive) heart failure: Secondary | ICD-10-CM | POA: Diagnosis present

## 2021-01-22 DIAGNOSIS — R296 Repeated falls: Secondary | ICD-10-CM | POA: Diagnosis not present

## 2021-01-22 DIAGNOSIS — I442 Atrioventricular block, complete: Secondary | ICD-10-CM | POA: Diagnosis present

## 2021-01-22 DIAGNOSIS — S300XXA Contusion of lower back and pelvis, initial encounter: Secondary | ICD-10-CM | POA: Diagnosis not present

## 2021-01-22 DIAGNOSIS — E876 Hypokalemia: Secondary | ICD-10-CM | POA: Diagnosis present

## 2021-01-22 DIAGNOSIS — I350 Nonrheumatic aortic (valve) stenosis: Secondary | ICD-10-CM

## 2021-01-22 DIAGNOSIS — R5381 Other malaise: Secondary | ICD-10-CM | POA: Diagnosis present

## 2021-01-22 DIAGNOSIS — D638 Anemia in other chronic diseases classified elsewhere: Secondary | ICD-10-CM | POA: Diagnosis present

## 2021-01-22 DIAGNOSIS — N179 Acute kidney failure, unspecified: Secondary | ICD-10-CM | POA: Diagnosis present

## 2021-01-22 DIAGNOSIS — Z20822 Contact with and (suspected) exposure to covid-19: Secondary | ICD-10-CM | POA: Diagnosis not present

## 2021-01-22 DIAGNOSIS — E785 Hyperlipidemia, unspecified: Secondary | ICD-10-CM | POA: Diagnosis present

## 2021-01-22 DIAGNOSIS — I959 Hypotension, unspecified: Secondary | ICD-10-CM | POA: Diagnosis not present

## 2021-01-22 DIAGNOSIS — N400 Enlarged prostate without lower urinary tract symptoms: Secondary | ICD-10-CM | POA: Diagnosis present

## 2021-01-22 DIAGNOSIS — Z66 Do not resuscitate: Secondary | ICD-10-CM | POA: Diagnosis not present

## 2021-01-22 DIAGNOSIS — I130 Hypertensive heart and chronic kidney disease with heart failure and stage 1 through stage 4 chronic kidney disease, or unspecified chronic kidney disease: Secondary | ICD-10-CM | POA: Diagnosis not present

## 2021-01-22 DIAGNOSIS — M4316 Spondylolisthesis, lumbar region: Secondary | ICD-10-CM | POA: Diagnosis not present

## 2021-01-22 DIAGNOSIS — J90 Pleural effusion, not elsewhere classified: Secondary | ICD-10-CM

## 2021-01-22 DIAGNOSIS — S065X0A Traumatic subdural hemorrhage without loss of consciousness, initial encounter: Principal | ICD-10-CM | POA: Diagnosis present

## 2021-01-22 DIAGNOSIS — Z7189 Other specified counseling: Secondary | ICD-10-CM

## 2021-01-22 DIAGNOSIS — E860 Dehydration: Secondary | ICD-10-CM | POA: Diagnosis present

## 2021-01-22 DIAGNOSIS — K6289 Other specified diseases of anus and rectum: Secondary | ICD-10-CM | POA: Diagnosis present

## 2021-01-22 DIAGNOSIS — Z7982 Long term (current) use of aspirin: Secondary | ICD-10-CM

## 2021-01-22 DIAGNOSIS — Z85828 Personal history of other malignant neoplasm of skin: Secondary | ICD-10-CM

## 2021-01-22 DIAGNOSIS — R079 Chest pain, unspecified: Secondary | ICD-10-CM

## 2021-01-22 DIAGNOSIS — Z79899 Other long term (current) drug therapy: Secondary | ICD-10-CM

## 2021-01-22 DIAGNOSIS — I739 Peripheral vascular disease, unspecified: Secondary | ICD-10-CM | POA: Diagnosis present

## 2021-01-22 DIAGNOSIS — Z950 Presence of cardiac pacemaker: Secondary | ICD-10-CM

## 2021-01-22 DIAGNOSIS — S065X9A Traumatic subdural hemorrhage with loss of consciousness of unspecified duration, initial encounter: Secondary | ICD-10-CM

## 2021-01-22 DIAGNOSIS — Z952 Presence of prosthetic heart valve: Secondary | ICD-10-CM

## 2021-01-22 DIAGNOSIS — Z9079 Acquired absence of other genital organ(s): Secondary | ICD-10-CM

## 2021-01-22 DIAGNOSIS — N182 Chronic kidney disease, stage 2 (mild): Secondary | ICD-10-CM | POA: Diagnosis present

## 2021-01-22 DIAGNOSIS — Z043 Encounter for examination and observation following other accident: Secondary | ICD-10-CM | POA: Diagnosis not present

## 2021-01-22 DIAGNOSIS — D649 Anemia, unspecified: Secondary | ICD-10-CM | POA: Diagnosis present

## 2021-01-22 DIAGNOSIS — E8809 Other disorders of plasma-protein metabolism, not elsewhere classified: Secondary | ICD-10-CM | POA: Diagnosis present

## 2021-01-22 DIAGNOSIS — Z9889 Other specified postprocedural states: Secondary | ICD-10-CM

## 2021-01-22 DIAGNOSIS — I2510 Atherosclerotic heart disease of native coronary artery without angina pectoris: Secondary | ICD-10-CM | POA: Diagnosis present

## 2021-01-22 DIAGNOSIS — M4186 Other forms of scoliosis, lumbar region: Secondary | ICD-10-CM | POA: Diagnosis not present

## 2021-01-22 NOTE — ED Triage Notes (Signed)
Arrives from by home by EMS, increase in generalized weakness, per family, 2 recent minor mechanical falls. A&O x4. Pt had fall today on tailbone, not complaining of pain. On bloodthinners.

## 2021-01-23 ENCOUNTER — Inpatient Hospital Stay (HOSPITAL_COMMUNITY): Payer: Medicare Other

## 2021-01-23 ENCOUNTER — Other Ambulatory Visit (HOSPITAL_COMMUNITY): Payer: Self-pay

## 2021-01-23 ENCOUNTER — Emergency Department (HOSPITAL_COMMUNITY): Payer: Medicare Other

## 2021-01-23 DIAGNOSIS — S065X0A Traumatic subdural hemorrhage without loss of consciousness, initial encounter: Secondary | ICD-10-CM | POA: Diagnosis not present

## 2021-01-23 DIAGNOSIS — D638 Anemia in other chronic diseases classified elsewhere: Secondary | ICD-10-CM | POA: Diagnosis present

## 2021-01-23 DIAGNOSIS — E876 Hypokalemia: Secondary | ICD-10-CM | POA: Diagnosis present

## 2021-01-23 DIAGNOSIS — K5180 Other ulcerative colitis without complications: Secondary | ICD-10-CM | POA: Diagnosis not present

## 2021-01-23 DIAGNOSIS — I209 Angina pectoris, unspecified: Secondary | ICD-10-CM | POA: Diagnosis not present

## 2021-01-23 DIAGNOSIS — N182 Chronic kidney disease, stage 2 (mild): Secondary | ICD-10-CM | POA: Diagnosis present

## 2021-01-23 DIAGNOSIS — Z20822 Contact with and (suspected) exposure to covid-19: Secondary | ICD-10-CM | POA: Diagnosis present

## 2021-01-23 DIAGNOSIS — R410 Disorientation, unspecified: Secondary | ICD-10-CM | POA: Diagnosis not present

## 2021-01-23 DIAGNOSIS — S300XXA Contusion of lower back and pelvis, initial encounter: Secondary | ICD-10-CM | POA: Diagnosis not present

## 2021-01-23 DIAGNOSIS — I442 Atrioventricular block, complete: Secondary | ICD-10-CM | POA: Diagnosis present

## 2021-01-23 DIAGNOSIS — S065X9A Traumatic subdural hemorrhage with loss of consciousness of unspecified duration, initial encounter: Secondary | ICD-10-CM

## 2021-01-23 DIAGNOSIS — J811 Chronic pulmonary edema: Secondary | ICD-10-CM | POA: Diagnosis not present

## 2021-01-23 DIAGNOSIS — F339 Major depressive disorder, recurrent, unspecified: Secondary | ICD-10-CM | POA: Diagnosis not present

## 2021-01-23 DIAGNOSIS — E860 Dehydration: Secondary | ICD-10-CM | POA: Diagnosis present

## 2021-01-23 DIAGNOSIS — N179 Acute kidney failure, unspecified: Secondary | ICD-10-CM | POA: Diagnosis present

## 2021-01-23 DIAGNOSIS — Z515 Encounter for palliative care: Secondary | ICD-10-CM | POA: Diagnosis not present

## 2021-01-23 DIAGNOSIS — R21 Rash and other nonspecific skin eruption: Secondary | ICD-10-CM | POA: Diagnosis not present

## 2021-01-23 DIAGNOSIS — I5030 Unspecified diastolic (congestive) heart failure: Secondary | ICD-10-CM | POA: Diagnosis not present

## 2021-01-23 DIAGNOSIS — I2510 Atherosclerotic heart disease of native coronary artery without angina pectoris: Secondary | ICD-10-CM | POA: Diagnosis not present

## 2021-01-23 DIAGNOSIS — R609 Edema, unspecified: Secondary | ICD-10-CM | POA: Diagnosis not present

## 2021-01-23 DIAGNOSIS — I5032 Chronic diastolic (congestive) heart failure: Secondary | ICD-10-CM | POA: Diagnosis not present

## 2021-01-23 DIAGNOSIS — S299XXA Unspecified injury of thorax, initial encounter: Secondary | ICD-10-CM | POA: Diagnosis not present

## 2021-01-23 DIAGNOSIS — F411 Generalized anxiety disorder: Secondary | ICD-10-CM | POA: Diagnosis present

## 2021-01-23 DIAGNOSIS — I672 Cerebral atherosclerosis: Secondary | ICD-10-CM | POA: Diagnosis not present

## 2021-01-23 DIAGNOSIS — D631 Anemia in chronic kidney disease: Secondary | ICD-10-CM | POA: Diagnosis present

## 2021-01-23 DIAGNOSIS — M4316 Spondylolisthesis, lumbar region: Secondary | ICD-10-CM | POA: Diagnosis not present

## 2021-01-23 DIAGNOSIS — R600 Localized edema: Secondary | ICD-10-CM | POA: Diagnosis not present

## 2021-01-23 DIAGNOSIS — N400 Enlarged prostate without lower urinary tract symptoms: Secondary | ICD-10-CM | POA: Diagnosis present

## 2021-01-23 DIAGNOSIS — R531 Weakness: Secondary | ICD-10-CM | POA: Diagnosis not present

## 2021-01-23 DIAGNOSIS — R091 Pleurisy: Secondary | ICD-10-CM | POA: Diagnosis not present

## 2021-01-23 DIAGNOSIS — Z66 Do not resuscitate: Secondary | ICD-10-CM | POA: Diagnosis present

## 2021-01-23 DIAGNOSIS — I480 Paroxysmal atrial fibrillation: Secondary | ICD-10-CM | POA: Diagnosis present

## 2021-01-23 DIAGNOSIS — A0472 Enterocolitis due to Clostridium difficile, not specified as recurrent: Secondary | ICD-10-CM | POA: Diagnosis present

## 2021-01-23 DIAGNOSIS — Z952 Presence of prosthetic heart valve: Secondary | ICD-10-CM | POA: Diagnosis not present

## 2021-01-23 DIAGNOSIS — E8809 Other disorders of plasma-protein metabolism, not elsewhere classified: Secondary | ICD-10-CM | POA: Diagnosis not present

## 2021-01-23 DIAGNOSIS — E509 Vitamin A deficiency, unspecified: Secondary | ICD-10-CM | POA: Diagnosis not present

## 2021-01-23 DIAGNOSIS — Z950 Presence of cardiac pacemaker: Secondary | ICD-10-CM | POA: Diagnosis not present

## 2021-01-23 DIAGNOSIS — I6389 Other cerebral infarction: Secondary | ICD-10-CM | POA: Diagnosis not present

## 2021-01-23 DIAGNOSIS — W19XXXA Unspecified fall, initial encounter: Secondary | ICD-10-CM | POA: Diagnosis not present

## 2021-01-23 DIAGNOSIS — M7989 Other specified soft tissue disorders: Secondary | ICD-10-CM | POA: Diagnosis not present

## 2021-01-23 DIAGNOSIS — D649 Anemia, unspecified: Secondary | ICD-10-CM | POA: Diagnosis not present

## 2021-01-23 DIAGNOSIS — E785 Hyperlipidemia, unspecified: Secondary | ICD-10-CM | POA: Diagnosis present

## 2021-01-23 DIAGNOSIS — J9811 Atelectasis: Secondary | ICD-10-CM | POA: Diagnosis not present

## 2021-01-23 DIAGNOSIS — K8020 Calculus of gallbladder without cholecystitis without obstruction: Secondary | ICD-10-CM | POA: Diagnosis not present

## 2021-01-23 DIAGNOSIS — Z043 Encounter for examination and observation following other accident: Secondary | ICD-10-CM | POA: Diagnosis not present

## 2021-01-23 DIAGNOSIS — Z7189 Other specified counseling: Secondary | ICD-10-CM | POA: Diagnosis not present

## 2021-01-23 DIAGNOSIS — R5381 Other malaise: Secondary | ICD-10-CM | POA: Diagnosis present

## 2021-01-23 DIAGNOSIS — I130 Hypertensive heart and chronic kidney disease with heart failure and stage 1 through stage 4 chronic kidney disease, or unspecified chronic kidney disease: Secondary | ICD-10-CM | POA: Diagnosis present

## 2021-01-23 DIAGNOSIS — R296 Repeated falls: Secondary | ICD-10-CM | POA: Diagnosis present

## 2021-01-23 DIAGNOSIS — M4186 Other forms of scoliosis, lumbar region: Secondary | ICD-10-CM | POA: Diagnosis not present

## 2021-01-23 DIAGNOSIS — I739 Peripheral vascular disease, unspecified: Secondary | ICD-10-CM | POA: Diagnosis present

## 2021-01-23 DIAGNOSIS — K6289 Other specified diseases of anus and rectum: Secondary | ICD-10-CM | POA: Diagnosis present

## 2021-01-23 DIAGNOSIS — S065XAA Traumatic subdural hemorrhage with loss of consciousness status unknown, initial encounter: Secondary | ICD-10-CM

## 2021-01-23 DIAGNOSIS — I350 Nonrheumatic aortic (valve) stenosis: Secondary | ICD-10-CM | POA: Diagnosis not present

## 2021-01-23 DIAGNOSIS — I10 Essential (primary) hypertension: Secondary | ICD-10-CM | POA: Diagnosis not present

## 2021-01-23 DIAGNOSIS — K5190 Ulcerative colitis, unspecified, without complications: Secondary | ICD-10-CM | POA: Diagnosis present

## 2021-01-23 DIAGNOSIS — W010XXA Fall on same level from slipping, tripping and stumbling without subsequent striking against object, initial encounter: Secondary | ICD-10-CM | POA: Diagnosis present

## 2021-01-23 DIAGNOSIS — Z9181 History of falling: Secondary | ICD-10-CM | POA: Diagnosis not present

## 2021-01-23 DIAGNOSIS — M6281 Muscle weakness (generalized): Secondary | ICD-10-CM | POA: Diagnosis not present

## 2021-01-23 DIAGNOSIS — R5383 Other fatigue: Secondary | ICD-10-CM | POA: Diagnosis not present

## 2021-01-23 DIAGNOSIS — J90 Pleural effusion, not elsewhere classified: Secondary | ICD-10-CM | POA: Diagnosis not present

## 2021-01-23 DIAGNOSIS — H44513 Absolute glaucoma, bilateral: Secondary | ICD-10-CM | POA: Diagnosis not present

## 2021-01-23 DIAGNOSIS — I517 Cardiomegaly: Secondary | ICD-10-CM | POA: Diagnosis not present

## 2021-01-23 LAB — RENAL FUNCTION PANEL
Albumin: 2.3 g/dL — ABNORMAL LOW (ref 3.5–5.0)
Anion gap: 7 (ref 5–15)
BUN: 35 mg/dL — ABNORMAL HIGH (ref 8–23)
CO2: 24 mmol/L (ref 22–32)
Calcium: 8.2 mg/dL — ABNORMAL LOW (ref 8.9–10.3)
Chloride: 108 mmol/L (ref 98–111)
Creatinine, Ser: 1.46 mg/dL — ABNORMAL HIGH (ref 0.61–1.24)
GFR, Estimated: 44 mL/min — ABNORMAL LOW (ref >60)
Glucose, Bld: 132 mg/dL — ABNORMAL HIGH (ref 70–99)
Phosphorus: 2.7 mg/dL (ref 2.5–4.6)
Potassium: 3.6 mmol/L (ref 3.5–5.1)
Sodium: 139 mmol/L (ref 135–145)

## 2021-01-23 LAB — RESP PANEL BY RT-PCR (FLU A&B, COVID) ARPGX2
Influenza A by PCR: NEGATIVE
Influenza B by PCR: NEGATIVE
SARS Coronavirus 2 by RT PCR: NEGATIVE

## 2021-01-23 LAB — C DIFFICILE QUICK SCREEN W PCR REFLEX
C Diff antigen: POSITIVE — AB
C Diff interpretation: DETECTED
C Diff toxin: POSITIVE — AB

## 2021-01-23 LAB — CBC
HCT: 33.4 % — ABNORMAL LOW (ref 39.0–52.0)
Hemoglobin: 10.5 g/dL — ABNORMAL LOW (ref 13.0–17.0)
MCH: 29.6 pg (ref 26.0–34.0)
MCHC: 31.4 g/dL (ref 30.0–36.0)
MCV: 94.1 fL (ref 80.0–100.0)
Platelets: 205 10*3/uL (ref 150–400)
RBC: 3.55 MIL/uL — ABNORMAL LOW (ref 4.22–5.81)
RDW: 17.3 % — ABNORMAL HIGH (ref 11.5–15.5)
WBC: 9.6 10*3/uL (ref 4.0–10.5)
nRBC: 0 % (ref 0.0–0.2)

## 2021-01-23 LAB — URINALYSIS, ROUTINE W REFLEX MICROSCOPIC
Bacteria, UA: NONE SEEN
Bilirubin Urine: NEGATIVE
Glucose, UA: NEGATIVE mg/dL
Hgb urine dipstick: NEGATIVE
Ketones, ur: NEGATIVE mg/dL
Leukocytes,Ua: NEGATIVE
Nitrite: NEGATIVE
Protein, ur: 30 mg/dL — AB
Specific Gravity, Urine: 1.019 (ref 1.005–1.030)
pH: 5 (ref 5.0–8.0)

## 2021-01-23 LAB — HEPATIC FUNCTION PANEL
ALT: 13 U/L (ref 0–44)
AST: 20 U/L (ref 15–41)
Albumin: 2.6 g/dL — ABNORMAL LOW (ref 3.5–5.0)
Alkaline Phosphatase: 58 U/L (ref 38–126)
Bilirubin, Direct: 0.1 mg/dL (ref 0.0–0.2)
Indirect Bilirubin: 0.8 mg/dL (ref 0.3–0.9)
Total Bilirubin: 0.9 mg/dL (ref 0.3–1.2)
Total Protein: 5.9 g/dL — ABNORMAL LOW (ref 6.5–8.1)

## 2021-01-23 LAB — LIPASE, BLOOD: Lipase: 20 U/L (ref 11–51)

## 2021-01-23 LAB — BASIC METABOLIC PANEL
Anion gap: 7 (ref 5–15)
BUN: 39 mg/dL — ABNORMAL HIGH (ref 8–23)
CO2: 27 mmol/L (ref 22–32)
Calcium: 8.9 mg/dL (ref 8.9–10.3)
Chloride: 106 mmol/L (ref 98–111)
Creatinine, Ser: 1.33 mg/dL — ABNORMAL HIGH (ref 0.61–1.24)
GFR, Estimated: 49 mL/min — ABNORMAL LOW (ref >60)
Glucose, Bld: 116 mg/dL — ABNORMAL HIGH (ref 70–99)
Potassium: 2.8 mmol/L — ABNORMAL LOW (ref 3.5–5.1)
Sodium: 140 mmol/L (ref 135–145)

## 2021-01-23 LAB — BRAIN NATRIURETIC PEPTIDE: B Natriuretic Peptide: 742.1 pg/mL — ABNORMAL HIGH (ref 0.0–100.0)

## 2021-01-23 LAB — TROPONIN I (HIGH SENSITIVITY)
Troponin I (High Sensitivity): 14 ng/L (ref <18)
Troponin I (High Sensitivity): 12 ng/L (ref <18)

## 2021-01-23 LAB — MAGNESIUM: Magnesium: 2 mg/dL (ref 1.7–2.4)

## 2021-01-23 LAB — MRSA PCR SCREENING: MRSA by PCR: NEGATIVE

## 2021-01-23 LAB — CBG MONITORING, ED: Glucose-Capillary: 107 mg/dL — ABNORMAL HIGH (ref 70–99)

## 2021-01-23 MED ORDER — ATORVASTATIN CALCIUM 20 MG PO TABS
20.0000 mg | ORAL_TABLET | ORAL | Status: DC
  Administered 2021-01-24 – 2021-01-28 (×3): 20 mg via ORAL
  Filled 2021-01-23: qty 2
  Filled 2021-01-23: qty 1
  Filled 2021-01-23 (×3): qty 2

## 2021-01-23 MED ORDER — AMLODIPINE BESYLATE 5 MG PO TABS
2.5000 mg | ORAL_TABLET | Freq: Every day | ORAL | Status: DC
  Administered 2021-01-23 – 2021-01-25 (×3): 2.5 mg via ORAL
  Filled 2021-01-23 (×3): qty 1

## 2021-01-23 MED ORDER — POTASSIUM CHLORIDE 10 MEQ/100ML IV SOLN
10.0000 meq | INTRAVENOUS | Status: AC
  Administered 2021-01-23 (×2): 10 meq via INTRAVENOUS
  Filled 2021-01-23 (×2): qty 100

## 2021-01-23 MED ORDER — ESCITALOPRAM OXALATE 10 MG PO TABS
10.0000 mg | ORAL_TABLET | Freq: Every day | ORAL | Status: DC
  Administered 2021-01-23 – 2021-01-29 (×7): 10 mg via ORAL
  Filled 2021-01-23 (×7): qty 1

## 2021-01-23 MED ORDER — FERROUS SULFATE 325 (65 FE) MG PO TABS
325.0000 mg | ORAL_TABLET | Freq: Every day | ORAL | Status: DC
  Administered 2021-01-23 – 2021-01-29 (×7): 325 mg via ORAL
  Filled 2021-01-23 (×7): qty 1

## 2021-01-23 MED ORDER — ACETAMINOPHEN 325 MG PO TABS
650.0000 mg | ORAL_TABLET | Freq: Four times a day (QID) | ORAL | Status: DC | PRN
  Administered 2021-01-23 – 2021-01-27 (×8): 650 mg via ORAL
  Filled 2021-01-23 (×9): qty 2

## 2021-01-23 MED ORDER — HYDRALAZINE HCL 20 MG/ML IJ SOLN
5.0000 mg | Freq: Four times a day (QID) | INTRAMUSCULAR | Status: DC | PRN
  Administered 2021-01-23: 5 mg via INTRAVENOUS
  Filled 2021-01-23: qty 1

## 2021-01-23 MED ORDER — HYDROCERIN EX CREA
TOPICAL_CREAM | Freq: Every day | CUTANEOUS | Status: DC
  Filled 2021-01-23: qty 113

## 2021-01-23 MED ORDER — FINASTERIDE 5 MG PO TABS
5.0000 mg | ORAL_TABLET | ORAL | Status: DC
  Administered 2021-01-23 – 2021-01-29 (×6): 5 mg via ORAL
  Filled 2021-01-23 (×6): qty 1

## 2021-01-23 MED ORDER — ADULT MULTIVITAMIN W/MINERALS CH
1.0000 | ORAL_TABLET | Freq: Every day | ORAL | Status: DC
  Administered 2021-01-23 – 2021-01-29 (×7): 1 via ORAL
  Filled 2021-01-23 (×7): qty 1

## 2021-01-23 MED ORDER — METOPROLOL SUCCINATE ER 50 MG PO TB24
50.0000 mg | ORAL_TABLET | Freq: Every day | ORAL | Status: DC
  Administered 2021-01-23 – 2021-01-29 (×7): 50 mg via ORAL
  Filled 2021-01-23 (×2): qty 2
  Filled 2021-01-23 (×2): qty 1
  Filled 2021-01-23 (×3): qty 2

## 2021-01-23 MED ORDER — VANCOMYCIN HCL 125 MG PO CAPS
125.0000 mg | ORAL_CAPSULE | Freq: Four times a day (QID) | ORAL | Status: DC
  Administered 2021-01-23 – 2021-01-29 (×25): 125 mg via ORAL
  Filled 2021-01-23 (×29): qty 1

## 2021-01-23 MED ORDER — LATANOPROST 0.005 % OP SOLN
1.0000 [drp] | Freq: Every day | OPHTHALMIC | Status: DC
  Administered 2021-01-23 – 2021-01-28 (×6): 1 [drp] via OPHTHALMIC
  Filled 2021-01-23: qty 2.5

## 2021-01-23 MED ORDER — ORAL CARE MOUTH RINSE
15.0000 mL | Freq: Two times a day (BID) | OROMUCOSAL | Status: DC
  Administered 2021-01-23 – 2021-01-29 (×13): 15 mL via OROMUCOSAL

## 2021-01-23 MED ORDER — ACETAMINOPHEN 650 MG RE SUPP: 650.0000 mg | Freq: Four times a day (QID) | RECTAL | Status: DC | PRN

## 2021-01-23 MED ORDER — POTASSIUM CHLORIDE 10 MEQ/100ML IV SOLN
10.0000 meq | INTRAVENOUS | Status: AC
  Administered 2021-01-23 (×4): 10 meq via INTRAVENOUS
  Filled 2021-01-23 (×4): qty 100

## 2021-01-23 MED ORDER — CHLORHEXIDINE GLUCONATE CLOTH 2 % EX PADS
6.0000 | MEDICATED_PAD | Freq: Every day | CUTANEOUS | Status: DC
  Administered 2021-01-23 – 2021-01-29 (×7): 6 via TOPICAL

## 2021-01-23 MED ORDER — POTASSIUM CHLORIDE CRYS ER 20 MEQ PO TBCR
20.0000 meq | EXTENDED_RELEASE_TABLET | Freq: Two times a day (BID) | ORAL | Status: AC
  Administered 2021-01-23 (×2): 20 meq via ORAL
  Filled 2021-01-23 (×2): qty 1

## 2021-01-23 MED ORDER — SODIUM CHLORIDE 0.9 % IV BOLUS
500.0000 mL | Freq: Once | INTRAVENOUS | Status: AC
  Administered 2021-01-23: 500 mL via INTRAVENOUS

## 2021-01-23 NOTE — Progress Notes (Signed)
[facility] [address] [employer] [facility]) Hospital Liaison note:  This is a pending outpatient-based Palliative Care patient. Will continue to follow for disposition.  Please call with any outpatient palliative questions or concerns.  Thank you, Lorelee Market, LPN [facility] Liaison [phone]

## 2021-01-23 NOTE — ED Notes (Signed)
[employer] paperwork given to MD.

## 2021-01-23 NOTE — ED Notes (Signed)
Interrogated [employer] pacemaker, information sent to [employer]. Awaiting fax.

## 2021-01-23 NOTE — Evaluation (Signed)
Physical Therapy Evaluation Patient Details Name: Dalton Townsend MRN: [id number] DOB: 06-25-1923 Today's Date: 01/23/2021   History of Present Illness  87 y.o. male admitted on 01/22/21 for falls, multiple with resultant SDH per CT scan.  Pt also with c-diff diarrhea, hypokalemia.  Pt with significant PMH of ulcerative colitis, s/p TAVR, s/p fem-fem bypass, PVD, pacemaker, HTN, essential HTN, CAD, aortic stenosis, chronic CHF, skin CA, L TKA, CABG.  Clinical Impression  Pt is much weaker than his described baseline (mod I with RW).  He required min/mod two person assist to get up and to the chair today, reporting being sore "all over" from his falls.  He is at a very high risk of falling and I believe he would benefit from SNF level rehab before returning home with his wife, Dalton Townsend. I spoke to [facility] who doesn't know what to do. She does not believe her daughters can come stay with them to help out.  I asked Rose to speak with them and speak with Dalton Townsend as I don't think he is strong enough to return home without someone who can provide physical assist to him when he is up on his feet.  She is not sure he will go for SNF for rehab, though.   PT to follow acutely for deficits listed below.    Follow Up Recommendations SNF    Equipment Recommendations  Wheelchair (measurements PT);Wheelchair cushion (measurements PT)    Recommendations for Other Services OT consult     Precautions / Restrictions Precautions Precautions: Fall Precaution Comments: multiple falls over the past two weeks      Mobility  Bed Mobility Overal bed mobility: Needs Assistance Bed Mobility: Rolling;Supine to Sit Rolling: Mod assist   Supine to sit: Mod assist     General bed mobility comments: Mod assist to get his hips/pelvis all the way to side lying, he was able to help control his own trunk with use of bed rail.  Rolled multiple times for pericare due to incontnent bowel in the bed from c-diff.  Two  incontinent episodes with me.  Mod assist to support trunk when coming to stand.    Transfers Overall transfer level: Needs assistance Equipment used: Rolling walker (2 wheeled) Transfers: Sit to/from [facility] Sit to Stand: Mod assist;+2 physical assistance;From elevated surface Stand pivot transfers: Min assist;+2 physical assistance       General transfer comment: one person and then two person mod assist to come to standing from elevated bed.  He could not stand from a lower bed, but reports he stays in a lift chair that normally helps him stand at home.  Min assist of two people once up on his feet to pivot and turn to the recliner chair.  Mod assist of one person to lower to low chair slowly without crashing.  Ambulation/Gait                Stairs            Wheelchair Mobility    Modified Rankin (Stroke Patients Only)       Balance Overall balance assessment: Needs assistance Sitting-balance support: Feet supported;Bilateral upper extremity supported Sitting balance-Leahy Scale: Poor Sitting balance - Comments: leans on his right elbow and eventually falls back with fatiuge.  Very weak postural muscles. Postural control: Right lateral lean;Posterior lean Standing balance support: Bilateral upper extremity supported Standing balance-Leahy Scale: Poor Standing balance comment: min assist once standing support from RW and therapist.  Pertinent Vitals/Pain Pain Assessment: Faces Faces Pain Scale: Hurts even more Pain Location: generalized "all over" Pain Descriptors / Indicators: Grimacing;Guarding Pain Intervention(s): Limited activity within patient's tolerance;Monitored during session;Repositioned    Home Living Family/patient expects to be discharged to:: Private residence Living Arrangements: Spouse/significant other Available Help at Discharge: Family;Available PRN/intermittently (wife likely  cannot physically assist pt, he has two daughters that live close by.) Type of Home: House Home Access: Stairs to enter Entrance Stairs-Rails: Right Entrance Stairs-Number of Steps: 3 Home Layout: One level;Laundry or work area in basement (is no longer allowed in the basement) Home Equipment: Environmental consultant - 2 wheels;Walker - 4 wheels;Other (comment) (lift recliner chair that he normally sleep in at night.)      Prior Function Level of Independence: Needs assistance   Gait / Transfers Assistance Needed: walks with RW in the home, uses the rollator for MD appointments  ADL's / Homemaking Assistance Needed: reports he does his own sponge bath and very rarely does he get into the shower.        Hand Dominance   Dominant Hand: Right    Extremity/Trunk Assessment   Upper Extremity Assessment Upper Extremity Assessment: Generalized weakness    Lower Extremity Assessment Lower Extremity Assessment: Generalized weakness    Cervical / Trunk Assessment Cervical / Trunk Assessment: Kyphotic (significant with associated forward head, near torticollis)  Communication   Communication: HOH  Cognition Arousal/Alertness: Awake/alert Behavior During Therapy: WFL for tasks assessed/performed Overall Cognitive Status: Impaired/Different from baseline Area of Impairment: Orientation;Memory;Awareness                 Orientation Level: Time (June, Thurs, was able to report year and situation)   Memory: Decreased short-term memory     Awareness: Intellectual   General Comments: Pt having difficulty connecting his weakness with it not being safe for him to return home yet.      General Comments      Exercises     Assessment/Plan    PT Assessment Patient needs continued PT services  PT Problem List Decreased strength;Decreased activity tolerance;Decreased balance;Decreased mobility;Decreased cognition;Decreased knowledge of use of DME;Decreased safety awareness;Pain;Decreased skin  integrity       PT Treatment Interventions DME instruction;Gait training;Stair training;Functional mobility training;Therapeutic activities;Therapeutic exercise;Balance training;Cognitive remediation;Patient/family education;Wheelchair mobility training    PT Goals (Current goals can be found in the Care Plan section)  Acute Rehab PT Goals Patient Stated Goal: to go home tomorrow PT Goal Formulation: With patient/family Time For Goal Achievement: 02/06/21 Potential to Achieve Goals: Good    Frequency Min 2X/week   Barriers to discharge Decreased caregiver support lives with elderly wife, ? if two daughters can help/stay with them.    Co-evaluation               AM-PAC PT "6 Clicks" Mobility  Outcome Measure Help needed turning from your back to your side while in a flat bed without using bedrails?: A Lot Help needed moving from lying on your back to sitting on the side of a flat bed without using bedrails?: A Lot Help needed moving to and from a bed to a chair (including a wheelchair)?: Total Help needed standing up from a chair using your arms (e.g., wheelchair or bedside chair)?: Total Help needed to walk in hospital room?: Total Help needed climbing 3-5 steps with a railing? : Total 6 Click Score: 8    End of Session   Activity Tolerance: Patient limited by fatigue;Patient limited by pain Patient left: in  chair;with call bell/phone within reach;with chair alarm set Nurse Communication: Other (comment) (RN assisting in mobility) PT Visit Diagnosis: Muscle weakness (generalized) (M62.81);Difficulty in walking, not elsewhere classified (R26.2);Pain Pain - Right/Left:  (generalized) Pain - part of body:  (generalized)    Time: 4818-5631 PT Time Calculation (min) (ACUTE ONLY): 60 min   Charges:   PT Evaluation $PT Eval Moderate Complexity: 1 Mod PT Treatments $Therapeutic Activity: 38-52 mins       Verdene Lennert, PT, DPT  Acute Rehabilitation [phone]  pager [phone]) [phone] office

## 2021-01-23 NOTE — H&P (Addendum)
History and Physical    Dalton Townsend HCW:[id number] DOB: 13-Dec-1922 DOA: 01/22/2021  PCP: Lajean Manes, MD  Patient coming from: home  Chief Complaint: fall  HPI: Dalton Townsend is a 87 y.o. male with medical history significant of a fib on anticoagulation, complete heart block s/p pacer placement, HTN, UC. Presenting after fall. Patient reports that over the last couple of days he's had a couple of falls and a near fall. He reports that they generally have happened in the same fashion. He has gone to his bathroom and tried to maneuver in a tight space. As he shifts his weight to turn, he losses balance. He falls to the floor on his rear end and will have his back fall towards the side of the toilet. He has not hit his any in either of the falls. He didn't has LOC in either of the falls. With his one near miss, he was able to catch himself on his walker. He reports that in general, he walks around with his walker. When he needs to get out of the house, he uses a rollator. With his falls, he has been unable to get up on his own, so he has called for help from family members. With his last fall -- yesterday evening around 6p -- his family noted that he was a little more confused. They were concerned and brought him to the ED for evaluation.   Of note, both patient and family reports recent chronic diarrhea. Patient has a history of UC. He is followed by Dr. Delma Officer. They discussed with Dr. Cristina Gong a few days ago how the patient continues to have diarrhea and is becoming weaker, presumably, because of it. Dr. Delma Officer recommended evaluation in the ED.    ED Course: He was found to have a small SDH. Neurosurgery was consulted. They rec'd rpt CTH in 6 hours and was ok to stay at [facility]. Pt was found to be c diff positive. [facility] was called for admission.   Review of Systems:  Denies CP, palpitations, dyspnea, lightheadedness, dizziness, N/V, fevers. Reports diarrhea, general weakness. Review of  systems is otherwise negative for all not mentioned in HPI.   PMHx Past Medical History:  Diagnosis Date  . Acute on chronic congestive heart failure ([place]) 04/02/2018  . Aortic stenosis 04/11/2018  . BPH (benign prostatic hyperplasia)   . CAD (coronary artery disease)    a. s/p CABG x6V in 1999, cath 03/2018 showed patent 6/6 bypass grafts  . Carotid artery disease ([place])    a. s/p R CEA, 80-99% restenosis of RICA, to be address after TAVR  . Dyslipidemia   . Essential hypertension   . GAD (generalized anxiety disorder)   . HTN (hypertension) 06/24/2010   Qualifier: Diagnosis of  By: Johnnye Sima MD, Dellis Filbert    . Intestinal infection due to Clostridium difficile 03/2010  . PAF (paroxysmal atrial fibrillation) (HCC)    a. on Eliquis  . Presence of permanent cardiac pacemaker   . PVD (peripheral vascular disease) ([place])   . S/P femoral-femoral bypass surgery    a. emergent fem fem bypass due to iliac complication during first attempt TAVR placement on 04/11/18 by Dr. Scot Dock  . S/P TAVR (transcatheter aortic valve replacement)    a. 04/18/18: s/p succesful TAVR with a [place] 3 THV  . Severe aortic stenosis   . Skin cancer   . Ulcerative colitis   . Ulcerative colitis ([place]) 06/24/2010   Qualifier: Diagnosis of  By: Johnnye Sima  MD, Dorothe Pea Past Surgical History:  Procedure Laterality Date  . BALLOON AORTIC VALVE VALVULOPLASTY N/A 04/11/2018   Procedure: BALLOON AORTIC VALVE VALVULOPLASTY.  RIGHT EXTERNAL ILIAC ARTERY OVERSEW.;  Surgeon: Sherren Mocha, MD;  Location: [place];  Service: Open Heart Surgery;  Laterality: N/A;  . CARDIAC CATHETERIZATION  1999  . CAROTID ENDARTERECTOMY Right 2004  . CATARACT EXTRACTION, BILATERAL  2011  . CORONARY ARTERY BYPASS GRAFT  04/1998   3 vessel CAD  . ENDARTERECTOMY FEMORAL Right 04/11/2018   Procedure: ENDARTERECTOMY FEMORAL;  Surgeon: Sherren Mocha, MD;  Location: [place];  Service: Open Heart Surgery;  Laterality: Right;  .  FEMORAL-FEMORAL BYPASS GRAFT N/A 04/11/2018   Procedure: LEFT TO RIGHT BYPASS GRAFT FEMORAL-FEMORAL ARTERY;  Surgeon: Sherren Mocha, MD;  Location: [place];  Service: Open Heart Surgery;  Laterality: N/A;  . INCISION AND DRAINAGE OF WOUND Left 05/04/2016   Procedure: SURGICAL PREP FOR GRAFTING LEFT LEG WITH THERA SKIN APPLICATION;  Surgeon: Irene Limbo, MD;  Location: [place];  Service: Plastics;  Laterality: Left;  . INTRAOPERATIVE ARTERIOGRAM  06/2000  . INTRAOPERATIVE TRANSTHORACIC ECHOCARDIOGRAM  04/11/2018   Procedure: INTRAOPERATIVE TRANSTHORACIC ECHOCARDIOGRAM;  Surgeon: Sherren Mocha, MD;  Location[facility];  Service: Open Heart Surgery;;  . PACEMAKER INSERTION  03/25/2010  . RIGHT/LEFT HEART CATH AND CORONARY/GRAFT ANGIOGRAPHY N/A 04/05/2018   Procedure: RIGHT/LEFT HEART CATH AND CORONARY/GRAFT ANGIOGRAPHY;  Surgeon: Burnell Blanks, MD;  Location: [place] [facility];  Service: Cardiovascular;  Laterality: N/A;  . TEE WITHOUT CARDIOVERSION N/A 04/18/2018   Procedure: TRANSESOPHAGEAL ECHOCARDIOGRAM (TEE);  Surgeon: Sherren Mocha, MD;  Location: [place];  Service: Open Heart Surgery;  Laterality: N/A;  . TOTAL KNEE ARTHROPLASTY  07/16/09  . TRANSCATHETER AORTIC VALVE REPLACEMENT, TRANSAPICAL N/A 04/18/2018   Procedure: TRANSCATHETER AORTIC VALVE REPLACEMENT, TRANSAPICAL. 50m EDWARDS SAPIEN 3 TRANSCATHETER HEART VALVE.;  Surgeon: CSherren Mocha MD;  Location: [place]  Service: Open Heart Surgery;  Laterality: N/A;  . TRANSURETHRAL RESECTION OF PROSTATE  1998    SocHx  reports that he quit smoking about 68 years ago. His smoking use included cigarettes. He quit after 10.00 years of use. He has quit using smokeless tobacco. He reports that he does not drink alcohol and does not use drugs.  Allergies  Allergen Reactions  . No Known Allergies     FamHx Family History  Problem Relation Age of Onset  . Congestive Heart Failure Mother   . Leukemia Father   . Leukemia Other   .  Heart failure Other   . Cancer Other     Prior to Admission medications   Medication Sig Start Date End Date Taking? Authorizing Provider  acetaminophen (TYLENOL) 500 MG tablet Take 1,000 mg by mouth at bedtime.    [provider]  amLODipine (NORVASC) 5 MG tablet Take 5 mg by mouth daily.    [provider]  amoxicillin (AMOXIL) 500 MG capsule Take 500 mg by mouth 4 (four) times daily as needed. Patient reports taking 4 capsules before any dental procedure.    [provider]  aspirin 81 MG chewable tablet Chew 1 tablet (81 mg total) by mouth daily. 04/22/18   Barrett, Erin R, PA-C  atorvastatin (LIPITOR) 20 MG tablet Take 20 mg by mouth every other day.     [provider]  balsalazide (COLAZAL) 750 MG capsule Take 2,250 mg by mouth 3 (three) times daily.    [provider]  ELIQUIS 5 MG TABS  tablet TAKE 1 TABLET TWICE A DAY 06/11/20   Jerline Pain, MD  escitalopram (LEXAPRO) 10 MG tablet Take 10 mg by mouth daily.    [provider]  ferrous sulfate 325 (65 FE) MG tablet Take 325 mg by mouth daily.    [provider]  finasteride (PROSCAR) 5 MG tablet Take 5 mg by mouth See admin instructions. Take one tablet (5 mg) by mouth every day except Wednesdays 01/13/15   [provider]  furosemide (LASIX) 40 MG tablet Take 2 tablets (80 mg total) by mouth daily. 04/24/20   Burtis Junes, NP  latanoprost (XALATAN) 0.005 % ophthalmic solution Place 1 drop into both eyes at bedtime.     [provider]  losartan (COZAAR) 100 MG tablet Take 1 tablet (100 mg total) by mouth daily. 05/17/18   Eileen Stanford, PA-C  mesalamine (CANASA) 1000 MG suppository Place 1,000 mg rectally at bedtime as needed (ulcerative colitis).    [provider]  metoprolol succinate (TOPROL-XL) 50 MG 24 hr tablet Take 50 mg by mouth daily. Take with or immediately following a meal.    [provider]  Multiple Vitamin  (MULTIVITAMIN WITH MINERALS) TABS tablet Take 1 tablet by mouth daily.    [provider]    Physical Exam: Vitals:   01/23/21 0445 01/23/21 0545 01/23/21 0615 01/23/21 0700  BP: (!) 160/55 (!) 143/49 (!) 160/66 (!) 175/65  Pulse: (!) 58 (!) 59 60 (!) 53  Resp: 20 (!) 23 15 20   Temp:    97.8 F (36.6 C)  TempSrc:    Oral  SpO2: 92% 97% 99% 99%  Weight:    77 kg  Height:    5' 11"  (1.803 m)    General: 87 y.o. male resting in bed in NAD Eyes: PERRL, normal sclera ENMT: Nares patent w/o discharge, orophaynx clear, dentition normal, ears w/o discharge/lesions/ulcers Neck: Supple, trachea midline Cardiovascular: RRR, +S1, S2, no m/g/r, equal pulses throughout Respiratory: CTABL, no w/r/r, normal WOB GI: BS+, NDNT, no masses noted, no organomegaly noted MSK: No e/c/c Skin: chronic venous changes BLE, flaking of skin, chronic bruising on BUE Neuro: A&O x 3, no focal deficits Psyc: Appropriate interaction and affect, calm/cooperative  Labs on Admission: I have personally reviewed following labs and imaging studies  CBC: Recent Labs  Lab 01/23/21 0255  WBC 9.6  HGB 10.5*  HCT 33.4*  MCV 94.1  PLT 149   Basic Metabolic Panel: Recent Labs  Lab 01/23/21 0255  NA 140  K 2.8*  CL 106  CO2 27  GLUCOSE 116*  BUN 39*  CREATININE 1.33*  CALCIUM 8.9   GFR: Estimated Creatinine Clearance: 35.4 mL/min (A) (by C-G formula based on SCr of 1.33 mg/dL (H)). Liver Function Tests: Recent Labs  Lab 01/23/21 0255  AST 20  ALT 13  ALKPHOS 58  BILITOT 0.9  PROT 5.9*  ALBUMIN 2.6*   Recent Labs  Lab 01/23/21 0255  LIPASE 20   No results for input(s): AMMONIA in the last 168 hours. Coagulation Profile: No results for input(s): INR, PROTIME in the last 168 hours. Cardiac Enzymes: No results for input(s): CKTOTAL, CKMB, CKMBINDEX, TROPONINI in the last 168 hours. BNP (last 3 results) Recent Labs    04/23/20 1118  PROBNP 7,476*   HbA1C: No results for  input(s): HGBA1C in the last 72 hours. CBG: Recent Labs  Lab 01/23/21 0200  GLUCAP 107*   Lipid Profile: No results for input(s): CHOL, HDL, LDLCALC,  TRIG, CHOLHDL, LDLDIRECT in the last 72 hours. Thyroid Function Tests: No results for input(s): TSH, T4TOTAL, FREET4, T3FREE, THYROIDAB in the last 72 hours. Anemia Panel: No results for input(s): VITAMINB12, FOLATE, FERRITIN, TIBC, IRON, RETICCTPCT in the last 72 hours. Urine analysis:    Component Value Date/Time   COLORURINE YELLOW 01/23/2021 0420   APPEARANCEUR CLEAR 01/23/2021 0420   LABSPEC 1.019 01/23/2021 0420   PHURINE 5.0 01/23/2021 0420   GLUCOSEU NEGATIVE 01/23/2021 0420   HGBUR NEGATIVE 01/23/2021 0420   BILIRUBINUR NEGATIVE 01/23/2021 0420   KETONESUR NEGATIVE 01/23/2021 0420   PROTEINUR 30 (A) 01/23/2021 0420   UROBILINOGEN 0.2 05/14/2010 1353   NITRITE NEGATIVE 01/23/2021 0420   LEUKOCYTESUR NEGATIVE 01/23/2021 0420    Radiological Exams on Admission: CT ABDOMEN PELVIS WO CONTRAST  Result Date: 01/23/2021 CLINICAL DATA:  Generalized weakness 2 recent falls. EXAM: CT ABDOMEN AND PELVIS WITHOUT CONTRAST TECHNIQUE: Multidetector CT imaging of the abdomen and pelvis was performed following the standard protocol without IV contrast. COMPARISON:  04/04/2018 FINDINGS: Lower chest: Cardiomegaly. Partially covered transcatheter aortic valve replacement and EKG leads. Large right and small left pleural effusions with atelectasis. Hepatobiliary: No focal liver abnormality.Very dilated gallbladder with layering high-density material. No regional inflammation or ductal dilatation. Pancreas: Unremarkable. Spleen: Unremarkable. Adrenals/Urinary Tract: Negative adrenals. No hydronephrosis or ureteral stone. 9 mm left upper pole renal calculus. Symmetric renal thinning. Unremarkable bladder. Stomach/Bowel: Thickening of the rectum with submucosal low-density appearance. There is mild stool distending the rectum. Sigmoid diverticulosis.  Vascular/Lymphatic: No acute vascular abnormality. 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 Atheromatous plaque is extensive. Bifemoral bypass which is unremarkable. No mass or adenopathy. Reproductive:Symmetric enlargement with dystrophic calcification. Other: No ascites or pneumoperitoneum. Musculoskeletal: 2 cm hematoma in the medial and lower right gluteus maximus. Advanced spinal degeneration with scoliosis. There is separate dedicated reformats. IMPRESSION: 1. Proctitis. 2. Related to fall, there is a small hematoma in the right gluteus maximus. 3. Chronic pleural effusions, currently large on the right and small on the left. 4. Cholelithiasis with very dilated gallbladder but no acute inflammation or ductal dilatation. 5. Additional chronic findings described above. Electronically Signed   By: Monte Fantasia M.D.   On: 01/23/2021 04:31   CT Head Wo Contrast  Result Date: 01/23/2021 CLINICAL DATA:  General weakness with 2 recent falls per family, pt on blood thinners, hx of HTN and skin ca, prev ct head and c-spine 09/07/18 EXAM: CT HEAD WITHOUT CONTRAST CT CERVICAL SPINE WITHOUT CONTRAST TECHNIQUE: Multidetector CT imaging of the head and cervical spine was performed following the standard protocol without intravenous contrast. Multiplanar CT image reconstructions of the cervical spine were also generated. COMPARISON:  None. FINDINGS: CT HEAD FINDINGS Brain: Patchy and confluent areas of decreased attenuation are noted throughout the deep and periventricular white matter of the cerebral hemispheres bilaterally, compatible with chronic microvascular ischemic disease. No evidence of large-territorial acute infarction. No parenchymal hemorrhage. No mass lesion. No extra-axial collection. No mass effect or midline shift. No hydrocephalus. Basilar cisterns are patent. Hyper to iso dense thickening along the left falx cerebri measuring up to 3 mm (5:11). Vascular: No hyperdense vessel. Skull: No acute  fracture or focal lesion. Atherosclerotic calcifications are present within the cavernous internal carotid and vertebral arteries. Sinuses/Orbits: Paranasal sinuses and mastoid air cells are clear. Bilateral trace pleural effusions. Orbits are unremarkable. Other: None. CT CERVICAL SPINE FINDINGS Alignment: Grade 1 anterolisthesis of C3 on C4. Skull base and vertebrae: Multilevel degenerative changes of the spine. No  acute fracture. No aggressive appearing focal osseous lesion or focal pathologic process. Soft tissues and spinal canal: No prevertebral fluid or swelling. No visible canal hematoma. Upper chest: At least moderate volume right pleural effusion. Other: None. IMPRESSION: 1. Question acute to subacute 3 mm left parafalcine subdural hematoma. Recommend follow-up CT in 6 hours. 2. No acute displaced fracture or traumatic listhesis of the cervical spine. 3. At least moderate volume right pleural effusion. These results will be called to the ordering clinician or representative by the Radiologist Assistant, and communication documented in the PACS or [employer]. Electronically Signed   By: Iven Finn M.D.   On: 01/23/2021 04:23   CT Chest Wo Contrast  Result Date: 01/23/2021 CLINICAL DATA:  Chest trauma with pleural effusion. On blood thinners, rule out hemothorax. EXAM: CT CHEST WITHOUT CONTRAST TECHNIQUE: Multidetector CT imaging of the chest was performed following the standard protocol without IV contrast. COMPARISON:  None. FINDINGS: Cardiovascular: Mildly enlarged heart size. CABG, transcatheter aortic valve replacement, and pacer implant. Extensive atheromatous calcification. Mediastinum/Nodes: No hematoma or pneumomediastinum. Lungs/Pleura: Large right and small left pleural effusions. There is extensive artifact related to arms down positioning but both effusions are layering and appear fluid density where least affected by artifact. Dependent atelectasis. Upper Abdomen: Partially  covered left nephrolithiasis. Musculoskeletal: Diffuse spinal degeneration with scoliosis. No acute finding. IMPRESSION: 1. No visible injury to the chest. 2. Large right and small left pleural effusions which are layering and simple appearing. Electronically Signed   By: Monte Fantasia M.D.   On: 01/23/2021 06:32   CT Cervical Spine Wo Contrast  Result Date: 01/23/2021 CLINICAL DATA:  General weakness with 2 recent falls per family, pt on blood thinners, hx of HTN and skin ca, prev ct head and c-spine 09/07/18 EXAM: CT HEAD WITHOUT CONTRAST CT CERVICAL SPINE WITHOUT CONTRAST TECHNIQUE: Multidetector CT imaging of the head and cervical spine was performed following the standard protocol without intravenous contrast. Multiplanar CT image reconstructions of the cervical spine were also generated. COMPARISON:  None. FINDINGS: CT HEAD FINDINGS Brain: Patchy and confluent areas of decreased attenuation are noted throughout the deep and periventricular white matter of the cerebral hemispheres bilaterally, compatible with chronic microvascular ischemic disease. No evidence of large-territorial acute infarction. No parenchymal hemorrhage. No mass lesion. No extra-axial collection. No mass effect or midline shift. No hydrocephalus. Basilar cisterns are patent. Hyper to iso dense thickening along the left falx cerebri measuring up to 3 mm (5:11). Vascular: No hyperdense vessel. Skull: No acute fracture or focal lesion. Atherosclerotic calcifications are present within the cavernous internal carotid and vertebral arteries. Sinuses/Orbits: Paranasal sinuses and mastoid air cells are clear. Bilateral trace pleural effusions. Orbits are unremarkable. Other: None. CT CERVICAL SPINE FINDINGS Alignment: Grade 1 anterolisthesis of C3 on C4. Skull base and vertebrae: Multilevel degenerative changes of the spine. No acute fracture. No aggressive appearing focal osseous lesion or focal pathologic process. Soft tissues and spinal  canal: No prevertebral fluid or swelling. No visible canal hematoma. Upper chest: At least moderate volume right pleural effusion. Other: None. IMPRESSION: 1. Question acute to subacute 3 mm left parafalcine subdural hematoma. Recommend follow-up CT in 6 hours. 2. No acute displaced fracture or traumatic listhesis of the cervical spine. 3. At least moderate volume right pleural effusion. These results will be called to the ordering clinician or representative by the Radiologist Assistant, and communication documented in the PACS or [employer]. Electronically Signed   By: Iven Finn M.D.   On:  01/23/2021 04:23   CT Lumbar Spine Wo Contrast  Result Date: 01/23/2021 CLINICAL DATA:  Weakness and falls. EXAM: CT LUMBAR SPINE WITHOUT CONTRAST TECHNIQUE: Reformats of the lumbar spine were generated from CT of the abdomen and pelvis. COMPARISON:  None. FINDINGS: Segmentation: 5 lumbar type vertebrae. Alignment: Scoliosis and L4-5 anterolisthesis. Vertebrae: No evidence of fracture or bone lesion. Paraspinal and other soft tissues: Reported separately Disc levels: Generalized disc narrowing, endplate ridging, and disc fissuring. Multilevel facet osteoarthritis with spurring. Notable foraminal impingement on the left at L4-5. Likely moderate spinal stenosis at L2-3 to L4-5. IMPRESSION: 1. Negative for lumbar spine fracture. 2. Advanced spinal degeneration with scoliosis and L4-5 anterolisthesis. Electronically Signed   By: Monte Fantasia M.D.   On: 01/23/2021 04:34   DG Chest Portable 1 View  Addendum Date: 01/23/2021   ADDENDUM REPORT: 01/23/2021 04:16 ADDENDUM: These results will be called to the ordering clinician or representative by the Radiologist Assistant, and communication documented in the PACS or [employer]. Electronically Signed   By: Iven Finn M.D.   On: 01/23/2021 04:16   Addendum Date: 01/23/2021   ADDENDUM REPORT: 01/23/2021 04:10 ADDENDUM: Right pleural effusion likely of at  least moderate volume. Electronically Signed   By: Iven Finn M.D.   On: 01/23/2021 04:10   Result Date: 01/23/2021 CLINICAL DATA:  Status post multiple falls. EXAM: PORTABLE CHEST 1 VIEW.  Patient is rotated. COMPARISON:  Chest x-ray 09/07/2018 FINDINGS: The heart size and mediastinal contours are unchanged. Aortic arch calcification. Status post aortic valve replacement. Prominence of the hilar vasculature. Limited evaluation of the right apex due to overlying mandible. Bibasilar airspace opacities. No pulmonary edema. Interval development of bilateral trace, right greater than left, pleural effusions. No pneumothorax. No acute osseous abnormality. IMPRESSION: Interval development of bilateral trace, right greater than left, pleural effusions. Bibasilar airspace opacities likely represent atelectasis with superimposed infection/inflammation not excluded. Electronically Signed: By: Iven Finn M.D. On: 01/23/2021 03:24    EKG: Independently reviewed. Dual paced rhythm  Assessment/Plan Multiple falls Subdural hematoma     - admitted to inpt, SDU     - EDP spoke with neurosurgery, rec'd rpt CTH in 6 hours; it is ordered     - mentation is good right now (A&O x 3); he's able to tell the entire story from the last 3 days. There was no LOC. No syncope w/u now     - will get PT/OT to eval     - hold eliquis d/t SDH; SCDs for DVT PPx  C diff diarrhea     - per pt/family, he has had diarrhea for some time now, w/o any abx exposure or known exposure to c diff positive patients     - start PO vanc per c. diff  Hx of ulcerative colitis     - continue home regimen     - family has asked that I notify Dr. Delma Officer that the pt is here; Dr. Delma Officer is aware and is graciously agreeing to courtesy visit; appreciated  Hypokalemia     - K+ 2.8. 20 mEq K+ IV ordered in ED; order additional 40 mEq IV and 20 mEq BID; recheck BMP at 1600 hours, check Mg2+   HTN       - continue home regimen  Complete  heart block Paroxysmal a fib     - holding eliquis for now d/t SDH     - continue other home meds  DVT prophylaxis: SCDs  Code Status: DNR,  confirmed w/ pt and dtr  Family Communication: w/ dtr in conference  Consults called: EDP spoke neurosurgery   Status is: Inpatient  Remains inpatient appropriate because:Inpatient level of care appropriate due to severity of illness   Dispo: The patient is from: Home              Anticipated d/c is to: TBD              Patient currently is not medically stable to d/c.   Difficult to place patient No  Time spent coordinating admission: 69 nminutes  [facility]  If 7PM-7AM, please contact night-coverage [email or web address]  01/23/2021, 8:37 AM

## 2021-01-23 NOTE — ED Provider Notes (Signed)
[facility] DEPT Provider Note   CSN: [id number] Arrival date & time: 01/22/21  2329     History Chief Complaint  Patient presents with  . Weakness    Dalton Townsend is a 87 y.o. male.  Patient from home where he lives with his wife.  He has a history of ulcerative colitis, aortic valve replacement, CAD, CHF on Eliquis, atrial fibrillation.  States he had a fall today while in the bathroom.  He normally walks with a walker but did not take into the bathroom due to space constraints.  States he lost balance and fell onto his buttocks.  Did not hit his head or lose consciousness.  Denies any pain in his back or buttocks at this time and feels improved.  There is no pain going down his legs.  There is no change in his chronic neck pain.  Denies hitting his head.  No chest pain or shortness of breath.  Family at bedside reports patient's been increasingly weak over the past several days and has had multiple falls.  They called his GI doctor earlier in the week due to increased diarrhea and concern for dehydration.  They are going to bring him to the ER later this week but given the fall today they wanted to expedite his visit.  Patient states he has had diarrhea for several weeks has been nonbloody.  It is happening 3-4 times a day.  No nausea vomiting or fever.  No chest pain.  Daughter reports of increased shortness of breath as well.  No fever or recent travel.  No recent antibiotic use.  The history is provided by the patient and a relative. The history is limited by the condition of the patient.  Weakness Associated symptoms: arthralgias, myalgias and shortness of breath   Associated symptoms: no abdominal pain, no chest pain, no dizziness, no dysuria, no fever, no headaches, no nausea and no vomiting        Past Medical History:  Diagnosis Date  . Acute on chronic congestive heart failure ([place]) 04/02/2018  . Aortic stenosis 04/11/2018  . BPH (benign  prostatic hyperplasia)   . CAD (coronary artery disease)    a. s/p CABG x6V in 1999, cath 03/2018 showed patent 6/6 bypass grafts  . Carotid artery disease ([place])    a. s/p R CEA, 80-99% restenosis of RICA, to be address after TAVR  . Dyslipidemia   . Essential hypertension   . GAD (generalized anxiety disorder)   . HTN (hypertension) 06/24/2010   Qualifier: Diagnosis of  By: Johnnye Sima MD, Dellis Filbert    . Intestinal infection due to Clostridium difficile 03/2010  . PAF (paroxysmal atrial fibrillation) (H[facility])    a. on Eliquis  . Presence of permanent cardiac pacemaker   . PVD (peripheral vascular disease) ([place])   . S/P femoral-femoral bypass surgery    a. emergent fem fem bypass due to iliac complication during first attempt TAVR placement on 04/11/18 by Dr. Scot Dock  . S/P TAVR (transcatheter aortic valve replacement)    a. 04/18/18: s/p succesful TAVR with a [place] 3 THV  . Severe aortic stenosis   . Skin cancer   . Ulcerative colitis   . Ulcerative colitis ([place]) 06/24/2010   Qualifier: Diagnosis of  By: Johnnye Sima MD, Dellis Filbert      Patient Active Problem List   Diagnosis Date Noted  . 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 S/P femoral-femoral bypass surgery   . Aortic stenosis  04/11/2018  . Presence of permanent cardiac pacemaker   . PAF (paroxysmal atrial fibrillation) ([place])   . Carotid artery disease ([place])   . CAD (coronary artery disease)   . BPH (benign prostatic hyperplasia)   . GAD (generalized anxiety disorder)   . Acute on chronic congestive heart failure ([place]) 04/02/2018  . Severe aortic stenosis 01/04/2014  . HTN (hypertension) 06/24/2010  . Ulcerative colitis ([place]) 06/24/2010    Past Surgical History:  Procedure Laterality Date  . BALLOON AORTIC VALVE VALVULOPLASTY N/A 04/11/2018   Procedure: BALLOON AORTIC VALVE VALVULOPLASTY.  RIGHT EXTERNAL ILIAC ARTERY OVERSEW.;  Surgeon: Sherren Mocha, MD;  Location: [facility];  Service: Open Heart  Surgery;  Laterality: N/A;  . CARDIAC CATHETERIZATION  1999  . CAROTID ENDARTERECTOMY Right 2004  . CATARACT EXTRACTION, BILATERAL  2011  . CORONARY ARTERY BYPASS GRAFT  04/1998   3 vessel CAD  . ENDARTERECTOMY FEMORAL Right 04/11/2018   Procedure: ENDARTERECTOMY FEMORAL;  Surgeon: Sherren Mocha, MD;  Location: [facility];  Service: Open Heart Surgery;  Laterality: Right;  . FEMORAL-FEMORAL BYPASS GRAFT N/A 04/11/2018   Procedure: LEFT TO RIGHT BYPASS GRAFT FEMORAL-FEMORAL ARTERY;  Surgeon: Sherren Mocha, MD;  Location: [facility];  Service: Open Heart Surgery;  Laterality: N/A;  . INCISION AND DRAINAGE OF WOUND Left 05/04/2016   Procedure: SURGICAL PREP FOR GRAFTING LEFT LEG WITH THERA SKIN APPLICATION;  Surgeon: Irene Limbo, MD;  Location: [facility] [place];  Service: Plastics;  Laterality: Left;  . INTRAOPERATIVE ARTERIOGRAM  06/2000  . INTRAOPERATIVE TRANSTHORACIC ECHOCARDIOGRAM  04/11/2018   Procedure: INTRAOPERATIVE TRANSTHORACIC ECHOCARDIOGRAM;  Surgeon: Sherren Mocha, MD;  Location[facility];  Service: Open Heart Surgery;;  . PACEMAKER INSERTION  03/25/2010  . RIGHT/LEFT HEART CATH AND CORONARY/GRAFT ANGIOGRAPHY N/A 04/05/2018   Procedure: RIGHT/LEFT HEART CATH AND CORONARY/GRAFT ANGIOGRAPHY;  Surgeon: Burnell Blanks, MD;  Location: [facility];  Service: Cardiovascular;  Laterality: N/A;  . TEE WITHOUT CARDIOVERSION N/A 04/18/2018   Procedure: TRANSESOPHAGEAL ECHOCARDIOGRAM (TEE);  Surgeon: Sherren Mocha, MD;  Location: [facility];  Service: Open Heart Surgery;  Laterality: N/A;  . TOTAL KNEE ARTHROPLASTY  07/16/09  . TRANSCATHETER AORTIC VALVE REPLACEMENT, TRANSAPICAL N/A 04/18/2018   Procedure: TRANSCATHETER AORTIC VALVE REPLACEMENT, TRANSAPICAL. 63m EDWARDS SAPIEN 3 TRANSCATHETER HEART VALVE.;  Surgeon: CSherren Mocha MD;  Location: [facility]  Service: Open Heart Surgery;  Laterality: N/A;  . TRANSURETHRAL RESECTION OF PROSTATE  1998       Family History  Problem Relation Age of Onset   . Congestive Heart Failure Mother   . Leukemia Father   . Leukemia Other   . Heart failure Other   . Cancer Other     Social History   Tobacco Use  . Smoking status: Former Smoker    Years: 10.00    Types: Cigarettes    Quit date: 08/30/1952    Years since quitting: 68.4  . Smokeless tobacco: Former UNetwork engineer . Vaping Use: Never used  Substance Use Topics  . Alcohol use: No  . Drug use: No    Home Medications Prior to Admission medications   Medication Sig Start Date End Date Taking? Authorizing Provider  acetaminophen (TYLENOL) 500 MG tablet Take 1,000 mg by mouth at bedtime.    [provider]  amLODipine (NORVASC) 5 MG tablet Take 5 mg by mouth daily.    [provider]  amoxicillin (AMOXIL) 500 MG capsule Take 500 mg by mouth 4 (four) times daily as needed. Patient reports  taking 4 capsules before any dental procedure.    [provider]  aspirin 81 MG chewable tablet Chew 1 tablet (81 mg total) by mouth daily. 04/22/18   Barrett, Erin R, PA-C  atorvastatin (LIPITOR) 20 MG tablet Take 20 mg by mouth every other day.     [provider]  balsalazide (COLAZAL) 750 MG capsule Take 2,250 mg by mouth 3 (three) times daily.    [provider]  ELIQUIS 5 MG TABS tablet TAKE 1 TABLET TWICE A DAY 06/11/20   Jerline Pain, MD  escitalopram (LEXAPRO) 10 MG tablet Take 10 mg by mouth daily.    [provider]  ferrous sulfate 325 (65 FE) MG tablet Take 325 mg by mouth daily.    [provider]  finasteride (PROSCAR) 5 MG tablet Take 5 mg by mouth See admin instructions. Take one tablet (5 mg) by mouth every day except Wednesdays 01/13/15   [provider]  furosemide (LASIX) 40 MG tablet Take 2 tablets (80 mg total) by mouth daily. 04/24/20   Burtis Junes, NP  latanoprost (XALATAN) 0.005 % ophthalmic solution Place 1 drop into both eyes at bedtime.     [provider]  losartan (COZAAR) 100 MG  tablet Take 1 tablet (100 mg total) by mouth daily. 05/17/18   Eileen Stanford, PA-C  mesalamine (CANASA) 1000 MG suppository Place 1,000 mg rectally at bedtime as needed (ulcerative colitis).    [provider]  metoprolol succinate (TOPROL-XL) 50 MG 24 hr tablet Take 50 mg by mouth daily. Take with or immediately following a meal.    [provider]  Multiple Vitamin (MULTIVITAMIN WITH MINERALS) TABS tablet Take 1 tablet by mouth daily.    [provider]    Allergies    No known allergies  Review of Systems   Review of Systems  Constitutional: Positive for activity change, appetite change and fatigue. Negative for fever.  HENT: Negative for congestion and rhinorrhea.   Respiratory: Positive for shortness of breath. Negative for chest tightness.   Cardiovascular: Negative for chest pain.  Gastrointestinal: Negative for abdominal pain, nausea and vomiting.  Genitourinary: Negative for dysuria and hematuria.  Musculoskeletal: Positive for arthralgias, back pain and myalgias.  Neurological: Positive for weakness. Negative for dizziness, light-headedness and headaches.   all other systems are negative except as noted in the HPI and PMH.    Physical Exam Updated Vital Signs BP (!) 144/64   Pulse 61   Temp 99.2 F (37.3 C) (Oral)   Resp (!) 22   Ht 5' 11"  (1.803 m)   Wt 81 kg   SpO2 95%   BMI 24.91 kg/m   Physical Exam Vitals and nursing note reviewed.  Constitutional:      General: He is not in acute distress.    Appearance: He is well-developed.  HENT:     Head: Normocephalic and atraumatic.     Mouth/Throat:     Pharynx: No oropharyngeal exudate.  Eyes:     Conjunctiva/sclera: Conjunctivae normal.     Pupils: Pupils are equal, round, and reactive to light.  Neck:     Comments: Diffuse paraspinal C spine tenderness. No midline tenderness Cardiovascular:     Rate and Rhythm: Normal rate and regular rhythm.     Heart sounds: Normal heart  sounds. No murmur heard.   Pulmonary:     Effort: Pulmonary effort is normal. No respiratory distress.     Breath sounds: Normal breath sounds.  Abdominal:     Palpations: Abdomen is soft.     Tenderness: There is no abdominal tenderness. There is no guarding or rebound.  Musculoskeletal:        General: No tenderness. Normal range of motion.     Cervical back: Normal range of motion.     Right lower leg: Edema present.     Left lower leg: Edema present.     Comments: No T or L spine tenderness. FROM hips without pain.  Skin:    General: Skin is warm.  Neurological:     Mental Status: He is alert and oriented to person, place, and time.     Cranial Nerves: No cranial nerve deficit.     Motor: No abnormal muscle tone.     Coordination: Coordination normal.     Comments:  5/5 strength throughout. CN 2-12 intact.Equal grip strength.   Psychiatric:        Behavior: Behavior normal.     ED Results / Procedures / Treatments   Labs (all labs ordered are listed, but only abnormal results are displayed) Labs Reviewed  C DIFFICILE QUICK SCREEN W PCR REFLEX - Abnormal; Notable for the following components:      Result Value   C Diff antigen POSITIVE (*)    C Diff toxin POSITIVE (*)    All other components within normal limits  BASIC METABOLIC PANEL - Abnormal; Notable for the following components:   Potassium 2.8 (*)    Glucose, Bld 116 (*)    BUN 39 (*)    Creatinine, Ser 1.33 (*)    GFR, Estimated 49 (*)    All other components within normal limits  CBC - Abnormal; Notable for the following components:   RBC 3.55 (*)    Hemoglobin 10.5 (*)    HCT 33.4 (*)    RDW 17.3 (*)    All other components within normal limits  URINALYSIS, ROUTINE W REFLEX MICROSCOPIC - Abnormal; Notable for the following components:   Protein, ur 30 (*)    All other components within normal limits  HEPATIC FUNCTION PANEL - Abnormal; Notable for the following components:   Total Protein 5.9 (*)     Albumin 2.6 (*)    All other components within normal limits  BRAIN NATRIURETIC PEPTIDE - Abnormal; Notable for the following components:   B Natriuretic Peptide 742.1 (*)    All other components within normal limits  CBG MONITORING, ED - Abnormal; Notable for the following components:   Glucose-Capillary 107 (*)    All other components within normal limits  RESP PANEL BY RT-PCR (FLU A&B, COVID) ARPGX2  MRSA PCR SCREENING  LIPASE, BLOOD  TROPONIN I (HIGH SENSITIVITY)  TROPONIN I (HIGH SENSITIVITY)    EKG EKG Interpretation  Date/Time:  Friday Jan 23 2021 01:04:03 EDT Ventricular Rate:  60 PR Interval:  72 QRS Duration: 201 QT Interval:  528 QTC Calculation: 528 R Axis:   -74 Text Interpretation: A-V dual-paced rhythm with some inhibition No further analysis attempted due to paced rhythm No significant change was found Confirmed by Ezequiel Essex [phone]) on 01/23/2021 3:07:19 AM   Radiology CT Head Wo Contrast  Result Date: 01/23/2021 CLINICAL DATA:  General weakness with 2 recent falls per family, pt on blood thinners, hx of HTN and skin ca, prev ct head and c-spine 09/07/18 EXAM: CT HEAD WITHOUT CONTRAST CT CERVICAL SPINE WITHOUT CONTRAST TECHNIQUE: Multidetector CT imaging of the head and cervical spine was performed following the standard protocol  without intravenous contrast. Multiplanar CT image reconstructions of the cervical spine were also generated. COMPARISON:  None. FINDINGS: CT HEAD FINDINGS Brain: Patchy and confluent areas of decreased attenuation are noted throughout the deep and periventricular white matter of the cerebral hemispheres bilaterally, compatible with chronic microvascular ischemic disease. No evidence of large-territorial acute infarction. No parenchymal hemorrhage. No mass lesion. No extra-axial collection. No mass effect or midline shift. No hydrocephalus. Basilar cisterns are patent. Hyper to iso dense thickening along the left falx cerebri measuring  up to 3 mm (5:11). Vascular: No hyperdense vessel. Skull: No acute fracture or focal lesion. Atherosclerotic calcifications are present within the cavernous internal carotid and vertebral arteries. Sinuses/Orbits: Paranasal sinuses and mastoid air cells are clear. Bilateral trace pleural effusions. Orbits are unremarkable. Other: None. CT CERVICAL SPINE FINDINGS Alignment: Grade 1 anterolisthesis of C3 on C4. Skull base and vertebrae: Multilevel degenerative changes of the spine. No acute fracture. No aggressive appearing focal osseous lesion or focal pathologic process. Soft tissues and spinal canal: No prevertebral fluid or swelling. No visible canal hematoma. Upper chest: At least moderate volume right pleural effusion. Other: None. IMPRESSION: 1. Question acute to subacute 3 mm left parafalcine subdural hematoma. Recommend follow-up CT in 6 hours. 2. No acute displaced fracture or traumatic listhesis of the cervical spine. 3. At least moderate volume right pleural effusion. These results will be called to the ordering clinician or representative by the Radiologist Assistant, and communication documented in the PACS or [employer]. Electronically Signed   By: Iven Finn M.D.   On: 01/23/2021 04:23   CT Cervical Spine Wo Contrast  Result Date: 01/23/2021 CLINICAL DATA:  General weakness with 2 recent falls per family, pt on blood thinners, hx of HTN and skin ca, prev ct head and c-spine 09/07/18 EXAM: CT HEAD WITHOUT CONTRAST CT CERVICAL SPINE WITHOUT CONTRAST TECHNIQUE: Multidetector CT imaging of the head and cervical spine was performed following the standard protocol without intravenous contrast. Multiplanar CT image reconstructions of the cervical spine were also generated. COMPARISON:  None. FINDINGS: CT HEAD FINDINGS Brain: Patchy and confluent areas of decreased attenuation are noted throughout the deep and periventricular white matter of the cerebral hemispheres bilaterally, compatible with  chronic microvascular ischemic disease. No evidence of large-territorial acute infarction. No parenchymal hemorrhage. No mass lesion. No extra-axial collection. No mass effect or midline shift. No hydrocephalus. Basilar cisterns are patent. Hyper to iso dense thickening along the left falx cerebri measuring up to 3 mm (5:11). Vascular: No hyperdense vessel. Skull: No acute fracture or focal lesion. Atherosclerotic calcifications are present within the cavernous internal carotid and vertebral arteries. Sinuses/Orbits: Paranasal sinuses and mastoid air cells are clear. Bilateral trace pleural effusions. Orbits are unremarkable. Other: None. CT CERVICAL SPINE FINDINGS Alignment: Grade 1 anterolisthesis of C3 on C4. Skull base and vertebrae: Multilevel degenerative changes of the spine. No acute fracture. No aggressive appearing focal osseous lesion or focal pathologic process. Soft tissues and spinal canal: No prevertebral fluid or swelling. No visible canal hematoma. Upper chest: At least moderate volume right pleural effusion. Other: None. IMPRESSION: 1. Question acute to subacute 3 mm left parafalcine subdural hematoma. Recommend follow-up CT in 6 hours. 2. No acute displaced fracture or traumatic listhesis of the cervical spine. 3. At least moderate volume right pleural effusion. These results will be called to the ordering clinician or representative by the Radiologist Assistant, and communication documented in the PACS or [employer]. Electronically Signed   By: Clelia Croft.D.  On: 01/23/2021 04:23   DG Chest Portable 1 View  Addendum Date: 01/23/2021   ADDENDUM REPORT: 01/23/2021 04:16 ADDENDUM: These results will be called to the ordering clinician or representative by the Radiologist Assistant, and communication documented in the PACS or [employer]. Electronically Signed   By: Iven Finn M.D.   On: 01/23/2021 04:16   Addendum Date: 01/23/2021   ADDENDUM REPORT: 01/23/2021 04:10  ADDENDUM: Right pleural effusion likely of at least moderate volume. Electronically Signed   By: Iven Finn M.D.   On: 01/23/2021 04:10   Result Date: 01/23/2021 CLINICAL DATA:  Status post multiple falls. EXAM: PORTABLE CHEST 1 VIEW.  Patient is rotated. COMPARISON:  Chest x-ray 09/07/2018 FINDINGS: The heart size and mediastinal contours are unchanged. Aortic arch calcification. Status post aortic valve replacement. Prominence of the hilar vasculature. Limited evaluation of the right apex due to overlying mandible. Bibasilar airspace opacities. No pulmonary edema. Interval development of bilateral trace, right greater than left, pleural effusions. No pneumothorax. No acute osseous abnormality. IMPRESSION: Interval development of bilateral trace, right greater than left, pleural effusions. Bibasilar airspace opacities likely represent atelectasis with superimposed infection/inflammation not excluded. Electronically Signed: By: Iven Finn M.D. On: 01/23/2021 03:24    Procedures .Critical Care Performed by: Ezequiel Essex, MD Authorized by: Ezequiel Essex, MD   Critical care provider statement:    Critical care time (minutes):  45   Critical care was necessary to treat or prevent imminent or life-threatening deterioration of the following conditions:  CNS failure or compromise and dehydration   Critical care was time spent personally by me on the following activities:  Discussions with consultants, evaluation of patient's response to treatment, examination of patient, ordering and performing treatments and interventions, ordering and review of laboratory studies, ordering and review of radiographic studies, pulse oximetry, re-evaluation of patient's condition, obtaining history from patient or surrogate and review of old charts     Medications Ordered in ED Medications  sodium chloride 0.9 % bolus 500 mL (has no administration in time range)    ED Course  I have reviewed the  triage vital signs and the nursing notes.  Pertinent labs & imaging results that were available during my care of the patient were reviewed by me and considered in my medical decision making (see chart for details).    MDM Rules/Calculators/A&P                         Fall with several weeks of generalized weakness with worsening diarrhea and concern for dehydration. Patient denies any injury from the fall.  Denies hitting his head.  He does take anticoagulation.  CT head shows a 3 mm parafalcine subdural hematoma. Patient does take Eliquis but is awake and alert.  Will discuss with neurosurgery.  D/w Dr. Reatha Armour of neurosurgery.  He recommends holding Eliquis but no need for emergent reversal as this is likely subacute.  Patient can stay here at [facility] long.  Traumatic imaging is otherwise negative.  There is a gluteal hematoma. Labs show stable hemoglobin but stable creatinine but hypokalemia.  Chest x-ray shows right pleural effusion. Discussed with Dr. Pascal Lux of radiology.  He does not favor blood products at this point as effusion looks simple and not loculated. Does agree CT chest would be helpul to confirm no hemothorax  CT chest shows no traumatic injury.  Pleural effusions do not appear to be blood.  Vitals remained stable.  Patient found to have dehydration  with AKI and hypokalemia.  He is gently hydrated.  Already has some effusions on his chest x-ray. Incidentally found to be C. difficile positive.  Admission discussed with Dr. Sidney Ace.  Final Clinical Impression(s) / ED Diagnoses Final diagnoses:  Weakness  Dehydration  SDH (subdural hematoma) ([place])    Rx / DC Orders ED Discharge Orders    None       Jaina Morin, Annie Main, MD 01/23/21 0740

## 2021-01-23 NOTE — Progress Notes (Signed)
Called by EDMD about small parafalcine SDH while on aspirin and eliquis. Neuro intact. Had some minor recent falls without LOC.  I recommended repeat CT without reversal of agents.  Repeat CT grossly stable today  Does not need neurosurgical intervention  Recommend evaluation for anticoag due to falls.  Hold aspirin and eliquis for 2 weeks then ok to resume.  Can followup as outpatient.   Elwin Sleight, DO Neurosurgeon

## 2021-01-23 NOTE — Plan of Care (Signed)
Pt will continue to carefully monitor patient's progression of care plan.

## 2021-01-23 NOTE — Consult Note (Signed)
WOC Nurse Consult Note: Reason for Consult: hyperkeratosis to bilateral LEs  And also scattered full and partial thickness circular wounds consistent with presentation of arterial insufficiency Right LE>Left LE. Skin tear on right buttock from traumatic fall in bathroom, also bruising (ecchymosis) at lower right buttock/ischial tuberosity region. Bilateral heels intact.  Wound type: PAD, trauma Pressure Injury POA: N/A Measurement: RLE:  15cm x 3cm area with multiple full and partial thickness open areas, most are circular, punctate with pale pink wound bed and serous exudate in a small tao moderate amount. Bilateral hyperkeratoses. Left LE with fewer, but similar lesions. Right buttock with 5cm x 1.6cm x 0.2cm area of full thickness skin loss secondary to fall/trauma. Small amount serous exudate Right IT:  5cm x 7cm area of ecchymosis Perineal: erythema secondary to multiple loose stools Wound bed:As noted above Drainage (amount, consistency, odor) As noted above Periwound: pale, dry Dressing procedure/placement/frequency: Patient is on a mattress replacement with low air loss feature and is bring turned and repositioned from side to side. Bedside RN is a Wound Treatment Associate (WTA) and she is instrumental in my assessment and visit today. Bilateral pressure redistribution heel boots are provided today, topical care for the open areas to the LEs and buttock will be with xeroform gauze (antimicrobial, nonadherent).    [facility] nursing team will not follow, but will remain available to this patient, the nursing and medical teams.  Please re-consult if needed. Thanks, Maudie Flakes, MSN, RN, [place], Arther Abbott  Pager# [phone]

## 2021-01-24 ENCOUNTER — Inpatient Hospital Stay (HOSPITAL_COMMUNITY): Payer: Medicare Other

## 2021-01-24 DIAGNOSIS — N400 Enlarged prostate without lower urinary tract symptoms: Secondary | ICD-10-CM | POA: Diagnosis not present

## 2021-01-24 DIAGNOSIS — Z950 Presence of cardiac pacemaker: Secondary | ICD-10-CM

## 2021-01-24 DIAGNOSIS — I480 Paroxysmal atrial fibrillation: Secondary | ICD-10-CM

## 2021-01-24 DIAGNOSIS — S065X9A Traumatic subdural hemorrhage with loss of consciousness of unspecified duration, initial encounter: Secondary | ICD-10-CM | POA: Diagnosis not present

## 2021-01-24 DIAGNOSIS — A0472 Enterocolitis due to Clostridium difficile, not specified as recurrent: Secondary | ICD-10-CM

## 2021-01-24 DIAGNOSIS — J90 Pleural effusion, not elsewhere classified: Secondary | ICD-10-CM

## 2021-01-24 DIAGNOSIS — D649 Anemia, unspecified: Secondary | ICD-10-CM | POA: Diagnosis not present

## 2021-01-24 DIAGNOSIS — I10 Essential (primary) hypertension: Secondary | ICD-10-CM

## 2021-01-24 DIAGNOSIS — I350 Nonrheumatic aortic (valve) stenosis: Secondary | ICD-10-CM

## 2021-01-24 DIAGNOSIS — I2510 Atherosclerotic heart disease of native coronary artery without angina pectoris: Secondary | ICD-10-CM

## 2021-01-24 DIAGNOSIS — Z952 Presence of prosthetic heart valve: Secondary | ICD-10-CM

## 2021-01-24 LAB — AMMONIA: Ammonia: 36 umol/L — ABNORMAL HIGH (ref 9–35)

## 2021-01-24 LAB — COMPREHENSIVE METABOLIC PANEL
ALT: 13 U/L (ref 0–44)
AST: 27 U/L (ref 15–41)
Albumin: 2.1 g/dL — ABNORMAL LOW (ref 3.5–5.0)
Alkaline Phosphatase: 50 U/L (ref 38–126)
Anion gap: 7 (ref 5–15)
BUN: 35 mg/dL — ABNORMAL HIGH (ref 8–23)
CO2: 23 mmol/L (ref 22–32)
Calcium: 8.2 mg/dL — ABNORMAL LOW (ref 8.9–10.3)
Chloride: 107 mmol/L (ref 98–111)
Creatinine, Ser: 1.11 mg/dL (ref 0.61–1.24)
GFR, Estimated: >60 mL/min (ref >60)
Glucose, Bld: 115 mg/dL — ABNORMAL HIGH (ref 70–99)
Potassium: 3.8 mmol/L (ref 3.5–5.1)
Sodium: 137 mmol/L (ref 135–145)
Total Bilirubin: 0.5 mg/dL (ref 0.3–1.2)
Total Protein: 5.2 g/dL — ABNORMAL LOW (ref 6.5–8.1)

## 2021-01-24 LAB — CBC
HCT: 31 % — ABNORMAL LOW (ref 39.0–52.0)
Hemoglobin: 9.9 g/dL — ABNORMAL LOW (ref 13.0–17.0)
MCH: 30 pg (ref 26.0–34.0)
MCHC: 31.9 g/dL (ref 30.0–36.0)
MCV: 93.9 fL (ref 80.0–100.0)
Platelets: 219 10*3/uL (ref 150–400)
RBC: 3.3 MIL/uL — ABNORMAL LOW (ref 4.22–5.81)
RDW: 17.7 % — ABNORMAL HIGH (ref 11.5–15.5)
WBC: 7.7 10*3/uL (ref 4.0–10.5)
nRBC: 0 % (ref 0.0–0.2)

## 2021-01-24 LAB — PROTIME-INR
INR: 1.2 (ref 0.8–1.2)
Prothrombin Time: 15.2 seconds (ref 11.4–15.2)

## 2021-01-24 MED ORDER — FUROSEMIDE 40 MG PO TABS
40.0000 mg | ORAL_TABLET | Freq: Every day | ORAL | Status: DC
  Administered 2021-01-24: 40 mg via ORAL
  Filled 2021-01-24: qty 1

## 2021-01-24 NOTE — Evaluation (Signed)
Occupational Therapy Evaluation Patient Details Name: Dalton Townsend MRN: [id number] DOB: Apr 26, 1923 Today's Date: 01/24/2021    History of Present Illness 87 y.o. male admitted on 01/22/21 for falls, multiple with resultant SDH per CT scan.  Pt also with c-diff diarrhea, hypokalemia.  Pt with significant PMH of ulcerative colitis, s/p TAVR, s/p fem-fem bypass, PVD, pacemaker, HTN, essential HTN, CAD, aortic stenosis, chronic CHF, skin CA, L TKA, CABG.   Clinical Impression   Limited evaluation due to pt with lethargy. Responds, but has difficulty staying alert today. Pt requiring total assist for bed level mobility for pericare, change of linen and to change soiled gown. At his baseline, he ambulated household distances with a walker and was able to perform self care independently. He lives with his 68 year old wife who cannot provide physical assistance. He has excellent support of his daughters. Will follow acutely. Recommending SNF for ST rehab prior to return home.     Follow Up Recommendations  SNF;Supervision/Assistance - 24 hour    Equipment Recommendations  Hospital bed;Wheelchair (measurements OT);Wheelchair cushion (measurements OT);3 in 1 bedside commode    Recommendations for Other Services       Precautions / Restrictions Precautions Precautions: Fall Precaution Comments: multiple falls, bowel incontinence      Mobility Bed Mobility Overal bed mobility: Needs Assistance Bed Mobility: Rolling Rolling: Total assist         General bed mobility comments: assisted to roll for pericare and change of linen    Transfers                 General transfer comment: deferred    Balance                                           ADL either performed or assessed with clinical judgement   ADL                                         General ADL Comments: requiring total assist this visit due to lethargy     Vision  Patient Visual Report: No change from baseline       Perception     Praxis      Pertinent Vitals/Pain Pain Assessment: Faces Faces Pain Scale: Hurts little more Pain Location: buttocks with pericare Pain Descriptors / Indicators: Grimacing;Guarding Pain Intervention(s): Monitored during session;Repositioned     Hand Dominance Right   Extremity/Trunk Assessment Upper Extremity Assessment Upper Extremity Assessment: Generalized weakness   Lower Extremity Assessment Lower Extremity Assessment: Defer to PT evaluation       Communication Communication Communication: HOH   Cognition Arousal/Alertness: Awake/alert Behavior During Therapy: WFL for tasks assessed/performed Overall Cognitive Status: Difficult to assess                                     General Comments       Exercises     Shoulder Instructions      Home Living Family/patient expects to be discharged to:: Skilled nursing facility Living Arrangements: Spouse/significant other Available Help at Discharge: Family;Available 24 hours/day (wife cannot physically assist) Type of Home: House Home Access: Stairs to enter [employer] of Steps: 3 Entrance Stairs-Rails:  Right Home Layout: One level;Laundry or work area in [place] Shower/Tub: Teacher, early years/pre: [place]: Environmental consultant - 2 wheels;Walker - 4 wheels;Other (comment) (lift chair)          Prior Functioning/Environment Level of Independence: Needs assistance  Gait / Transfers Assistance Needed: walks with RW in the home, uses the rollator for MD appointments, sleeps in lift chair ADL's / Homemaking Assistance Needed: reports he does his own sponge bath and very rarely does he get into the shower.   Comments: family reports pt sleeps much of the day lately        OT Problem List: Decreased strength;Decreased activity tolerance;Pain;Decreased knowledge of use of DME or  AE;Decreased cognition      OT Treatment/Interventions: Self-care/ADL training;DME and/or AE instruction;Therapeutic activities;Patient/family education;Balance training;Cognitive remediation/compensation    OT Goals(Current goals can be found in the care plan section) Acute Rehab OT Goals Patient Stated Goal: did not state OT Goal Formulation: With family Time For Goal Achievement: 02/07/21 Potential to Achieve Goals: Fair ADL Goals Pt Will Perform Eating: with set-up;sitting Pt Will Perform Grooming: with set-up;sitting Pt Will Perform Upper Body Dressing: with min assist;sitting Pt Will Transfer to Toilet: with max assist;stand pivot transfer;bedside commode Additional ADL Goal #1: Pt will perform bed mobility with mod assist in preparation for ADL, for repositioning and skin integrity.  OT Frequency: Min 2X/week   Barriers to D/C: Decreased caregiver support          Co-evaluation              AM-PAC OT "6 Clicks" Daily Activity     Outcome Measure Help from another person eating meals?: Total Help from another person taking care of personal grooming?: Total Help from another person toileting, which includes using toliet, bedpan, or urinal?: Total Help from another person bathing (including washing, rinsing, drying)?: Total Help from another person to put on and taking off regular upper body clothing?: Total Help from another person to put on and taking off regular lower body clothing?: Total 6 Click Score: 6   End of Session Nurse Communication: Mobility status  Activity Tolerance: Patient limited by lethargy Patient left: in bed;with call bell/phone within reach;with bed alarm set  OT Visit Diagnosis: Muscle weakness (generalized) (M62.81);Pain                Time: 1459-1535 OT Time Calculation (min): 36 min Charges:  OT General Charges $OT Visit: 1 Visit OT Evaluation $OT Eval Moderate Complexity: 1 Mod OT Treatments $Self Care/Home Management : 8-22  mins  Nestor Lewandowsky, OTR/L Acute Rehabilitation Services Pager: [phone] Office: [phone]  Malka So 01/24/2021, 4:20 PM

## 2021-01-24 NOTE — TOC Initial Note (Addendum)
Transition of Care [facility]) - Initial/Assessment Note    Patient Details  Name: Dalton Townsend MRN: [id number] Date of Birth: 26-Apr-1923  [facility] CM/SW Contact:    Iona Beard, [place] Phone Number: 01/24/2021, 11:22 AM  Clinical Narrative:                 PT is recommending SNF for pt. CSW called into pts room, pts daughter answered. CSW inquired about pts interest in going to a SNF for rehab. She states that pt understands he needs this to get stronger and they would like a referral sent out. Family would prefer [facility]. CSW to complete Fl2 and send out pts referral. Pts PASRR is [phone] A. TOC to follow.   Expected Discharge Plan: Skilled Nursing Facility Barriers to Discharge: Continued Medical Work up   Patient Goals and CMS Choice Patient states their goals for this hospitalization and ongoing recovery are:: Go to SNF CMS Medicare.gov Compare Post Acute Care list provided to:: Patient Represenative (must comment) Choice offered to / list presented to : Adult Children  Expected Discharge Plan and Services Expected Discharge Plan: [place] In-house Referral: Clinical Social Work Discharge Planning Services: CM Consult Post Acute Care Choice: [place] Living arrangements for the past 2 months: [place]                                      Prior Living Arrangements/Services Living arrangements for the past 2 months: Single Family Home Lives with:: Spouse Patient language and need for interpreter reviewed:: Yes Do you feel safe going back to the place where you live?: Yes      Need for Family Participation in Patient Care: Yes (Comment) Care giver support system in place?: Yes (comment)   Criminal Activity/Legal Involvement Pertinent to Current Situation/Hospitalization: No - Comment as needed  Activities of Daily Living Home Assistive Devices/Equipment: Walker (specify type) (pt has  partial but unsure if upper or lower) ADL Screening (condition at time of admission) Patient's cognitive ability adequate to safely complete daily activities?: Yes Is the patient deaf or have difficulty hearing?: Yes Does the patient have difficulty seeing, even when wearing glasses/contacts?: No Does the patient have difficulty concentrating, remembering, or making decisions?: No Patient able to express need for assistance with ADLs?: Yes Does the patient have difficulty dressing or bathing?: Yes Independently performs ADLs?: No Communication: Independent Dressing (OT): Needs assistance Is this a change from baseline?: Pre-admission baseline Grooming: Independent Feeding: Independent Bathing: Needs assistance Is this a change from baseline?: Pre-admission baseline Toileting: Needs assistance Is this a change from baseline?: Pre-admission baseline In/Out Bed: Needs assistance Is this a change from baseline?: Pre-admission baseline Walks in Home: Dependent (walked with a walker prior to admission but too weak to walk now) Is this a change from baseline?: Pre-admission baseline Does the patient have difficulty walking or climbing stairs?: Yes Weakness of Legs: Both Weakness of Arms/Hands: Both  Permission Sought/Granted                  Emotional Assessment Appearance:: Appears stated age Attitude/Demeanor/Rapport: Engaged Affect (typically observed): Accepting Orientation: : Oriented to Self,Oriented to Place,Oriented to  Time,Oriented to Situation Alcohol / Substance Use: Not Applicable Psych Involvement: No (comment)  Admission diagnosis:  Dehydration [E86.0] Subdural hematoma (HCC) [S06.5X9A] Weakness [R53.1] SDH (subdural hematoma) ([place]) [S06.5X9A] Patient Active Problem List  Diagnosis Date Noted  . Subdural hematoma ([place]) 01/23/2021  . 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 S/P femoral-femoral bypass surgery   . Aortic stenosis  04/11/2018  . Presence of permanent cardiac pacemaker   . PAF (paroxysmal atrial fibrillation) ([place])   . Carotid artery disease ([place])   . CAD (coronary artery disease)   . BPH (benign prostatic hyperplasia)   . GAD (generalized anxiety disorder)   . Acute on chronic congestive heart failure ([place]) 04/02/2018  . Severe aortic stenosis 01/04/2014  . HTN (hypertension) 06/24/2010  . Ulcerative colitis ([place]) 06/24/2010   PCP:  Lajean Manes, MD Pharmacy:   [id number] - [place] [address], [address] AT [facility] OF [address] [place] [address] Phone: [phone] Fax: [phone]  [facility] [place] [facility] [address] [employer] [facility] [place] [address] Phone: [phone] Fax: [phone]     Social Determinants of Health (SDOH) Interventions    Readmission Risk Interventions No flowsheet data found.

## 2021-01-24 NOTE — Progress Notes (Signed)
PROGRESS NOTE    BLAYN WHETSELL  PPI:[id number] DOB: 12/28/1922 DOA: 01/22/2021 PCP: Lajean Manes, MD (Confirm with patient/family/NH records and if not entered, this HAS to be entered at [facility] point of entry. "No PCP" if truly none.)   Chief Complaint  Patient presents with  . Weakness    Brief Narrative:  Patient 87 year old gentleman history of A. fib on chronic anticoagulation, complete heart block status post PPM, hypertension, UC presented to the ED with falls.  Patient noted to have a subdural hematoma with repeat head CT with no significant change.  Films assessed by neurosurgery who feel no further surgical intervention needed at this time.  Patient also noted to have acute on chronic diarrhea which has been worsening, C. difficile PCR was positive.  Patient placed on oral vancomycin.   Assessment & Plan:   Principal Problem:   Subdural hematoma (HCC) Active Problems:   C. difficile diarrhea   HTN (hypertension)   Severe aortic stenosis   Presence of permanent cardiac pacemaker   PAF (paroxysmal atrial fibrillation) (HCC)   CAD (coronary artery disease)   BPH (benign prostatic hyperplasia)   S/P TAVR (transcatheter aortic valve replacement)   Anemia   1  Subdural hematoma -Secondary to mechanical fall in the setting of anticoagulation. -EDP spoke with neurosurgery who reviewed CT head films x2 and felt repeat head CT was grossly stable, no need for neurosurgical intervention, reevaluation of need for anticoagulation and recommended at least holding aspirin and Eliquis for 2 weeks and okay to resume if needed. -Continue to hold anticoagulation of Eliquis and aspirin. -Patient noted to be alert to self and time.  Confused about where he is and the month and the state.  Patient unable to name who the president is. -We will repeat head CT tomorrow. -Supportive care.  2.  C. difficile diarrhea -Patient noted to have presented with acute on chronic worsening  diarrhea. -Patient with history of UC. -Patient with no recent exposure to antibiotics or C. difficile positive patients. -Continue oral vancomycin. -Supportive care.  3.  Bilateral pleural effusions R > L -Noted on CT chest and CT abdomen and pelvis. -Patient in no significant respiratory distress. -Pulmonary/PCCM consulted for further evaluation. -Resume home regimen diuretics.  4.  Hypokalemia -Likely secondary to GI losses. -Magnesium noted at 2.0. -Repleted.  Potassium at 3.8.  5.  Anemia -Likely secondary to problem #1 and also chronic. -Patient with no overt bleeding. -Follow H&H. -Transfusion threshold hemoglobin < 7.  6.  Hypertension -Continue home regimen of Norvasc, Proscar, Toprol-XL.  Resume home regimen Lasix.  7.  History of complete heart block status post PPM/paroxysmal A. Fib/severe aortic stenosis status post TAVR/CAD/chronic CHF -Currently rate controlled on Toprol-XL. -Eliquis on hold secondary to problem #1. -Resume home regimen Lasix.  8.  BPH -Continue Proscar.  9.  Prognosis -Patient 87 year old gentleman with history of complete heart block status post PPM, paroxysmal atrial fibrillation, UC, presenting now with falls leading to a subdural hematoma and noted to be positive for C. difficile diarrhea.  Anticoagulation on hold. -Patient also noted to have some pleural effusions. -Patient with multiple comorbidities, advanced age. -Consulted palliative care for goals of care.  Family in agreement.    DVT prophylaxis: SCD Code Status: DNR Family Communication: Updated patient and daughter, [name] at bedside Disposition:   Status is: Inpatient    Dispo: The patient is from: Home              Anticipated d/c is to:  TBD              Patient currently with subdural hematoma, C. difficile colitis, bilateral pleural effusions R > L, not stable for discharge   Difficult to place patient unknown       Consultants:   PCCM  pending  Neurosurgery: Dr.Dawley-reviewed CT findings 01/23/2021  Palliative care pending  Procedures:   CT head x2 01/23/2021  CT chest 01/23/2021  CT L-spine 01/23/2021  CT abdomen and pelvis 01/23/2021  CT C-spine 01/23/2021  Chest x-ray 01/23/2021  Antimicrobials:   Oral vancomycin 01/23/2021>>>> 02/02/2021   Subjective: Laying in bed.  Somewhat weak.  Denies any chest pain.  No shortness of breath.  No abdominal pain.  Alert to self.  Thinks he is in [employer].  Thinks he is in a [place].  Knows he is in [place] and knows it is 2022 however thinks it is June.  States the president is Marcelino Freestone.  Following commands appropriately.  Daughter at bedside.  Objective: Vitals:   01/24/21 1005 01/24/21 1137 01/24/21 1200 01/24/21 1321  BP: (!) 168/48  (!) 144/39   Pulse:   60   Resp:   17   Temp:  97.7 F (36.5 C) 97.7 F (36.5 C) (!) 97 F (36.1 C)  TempSrc:  Oral Oral Oral  SpO2:   94%   Weight:      Height:        Intake/Output Summary (Last 24 hours) at 01/24/2021 1508 Last data filed at 01/24/2021 0600 Gross per 24 hour  Intake 324.19 ml  Output 610 ml  Net -285.81 ml   Filed Weights   01/22/21 2343 01/23/21 0700  Weight: 81 kg 77 kg    Examination:  General exam: Appears calm and comfortable  Respiratory system: Decreased breath sounds on the right.  No wheezing.  No crackles.  Fair air movement.  Cardiovascular system: S1 & S2 heard, RRR. No JVD, murmurs, rubs, gallops or clicks. No pedal edema. Gastrointestinal system: Abdomen is nondistended, soft and nontender. No organomegaly or masses felt. Normal bowel sounds heard. Central nervous system: Alert and oriented to self and time. No focal neurological deficits. Extremities: Symmetric 5 x 5 power. Skin: Right buttocks with full-thickness skin loss/right lower extremity with full and partial thickness open areas.  Psychiatry: Judgement and insight appear normal. Mood & affect appropriate.     Data  Reviewed: I have personally reviewed following labs and imaging studies  CBC: Recent Labs  Lab 01/23/21 0255 01/24/21 0121  WBC 9.6 7.7  HGB 10.5* 9.9*  HCT 33.4* 31.0*  MCV 94.1 93.9  PLT 205 299    Basic Metabolic Panel: Recent Labs  Lab 01/23/21 0255 01/23/21 1152 01/23/21 1550 01/24/21 0121  NA 140  --  139 137  K 2.8*  --  3.6 3.8  CL 106  --  108 107  CO2 27  --  24 23  GLUCOSE 116*  --  132* 115*  BUN 39*  --  35* 35*  CREATININE 1.33*  --  1.46* 1.11  CALCIUM 8.9  --  8.2* 8.2*  MG  --  2.0  --   --   PHOS  --   --  2.7  --     GFR: Estimated Creatinine Clearance: 42.4 mL/min (by C-G formula based on SCr of 1.11 mg/dL).  Liver Function Tests: Recent Labs  Lab 01/23/21 0255 01/23/21 1550 01/24/21 0121  AST 20  --  27  ALT 13  --  13  ALKPHOS 58  --  50  BILITOT 0.9  --  0.5  PROT 5.9*  --  5.2*  ALBUMIN 2.6* 2.3* 2.1*    CBG: Recent Labs  Lab 01/23/21 0200  GLUCAP 107*     Recent Results (from the past 240 hour(s))  C Difficile Quick Screen w PCR reflex     Status: Abnormal   Collection Time: 01/23/21  4:35 AM   Specimen: STOOL  Result Value Ref Range Status   C Diff antigen POSITIVE (A) NEGATIVE Final   C Diff toxin POSITIVE (A) NEGATIVE Final   C Diff interpretation Toxin producing C. difficile detected.  Final    Comment: CRITICAL RESULT CALLED TO, READ BACK BY AND VERIFIED WITH: RN J NASH AT 0786 01/23/21 CRUICKSHANK A Performed at [facility], [place] [address], [place], [place] [address]   Resp Panel by RT-PCR (Flu A&B, Covid) Nasopharyngeal Swab     Status: None   Collection Time: 01/23/21  5:16 AM   Specimen: Nasopharyngeal Swab; Nasopharyngeal(NP) swabs in vial transport medium  Result Value Ref Range Status   SARS Coronavirus 2 by RT PCR NEGATIVE NEGATIVE Final    Comment: (NOTE) SARS-CoV-2 target nucleic acids are NOT DETECTED.  The SARS-CoV-2 RNA is generally detectable in upper respiratory specimens  during the acute phase of infection. The lowest concentration of SARS-CoV-2 viral copies this assay can detect is 138 copies/mL. A negative result does not preclude SARS-Cov-2 infection and should not be used as the sole basis for treatment or other patient management decisions. A negative result may occur with  improper specimen collection/handling, submission of specimen other than nasopharyngeal swab, presence of viral mutation(s) within the areas targeted by this assay, and inadequate number of viral copies(<138 copies/mL). A negative result must be combined with clinical observations, patient history, and epidemiological information. The expected result is Negative.  Fact Sheet for Patients:  EntrepreneurPulse.com.au  Fact Sheet for Healthcare Providers:  IncredibleEmployment.be  This test is no t yet approved or cleared by the [place] FDA and  has been authorized for detection and/or diagnosis of SARS-CoV-2 by FDA under an Emergency Use Authorization (EUA). This EUA will remain  in effect (meaning this test can be used) for the duration of the COVID-19 declaration under Section 564(b)(1) of the Act, 21 U.S.C.section 360bbb-3(b)(1), unless the authorization is terminated  or revoked sooner.       Influenza A by PCR NEGATIVE NEGATIVE Final   Influenza B by PCR NEGATIVE NEGATIVE Final    Comment: (NOTE) The Xpert Xpress SARS-CoV-2/FLU/RSV plus assay is intended as an aid in the diagnosis of influenza from Nasopharyngeal swab specimens and should not be used as a sole basis for treatment. Nasal washings and aspirates are unacceptable for Xpert Xpress SARS-CoV-2/FLU/RSV testing.  Fact Sheet for Patients: EntrepreneurPulse.com.au  Fact Sheet for Healthcare Providers: IncredibleEmployment.be  This test is not yet approved or cleared by the [place] FDA and has been authorized for detection  and/or diagnosis of SARS-CoV-2 by FDA under an Emergency Use Authorization (EUA). This EUA will remain in effect (meaning this test can be used) for the duration of the COVID-19 declaration under Section 564(b)(1) of the Act, 21 U.S.C. section 360bbb-3(b)(1), unless the authorization is terminated or revoked.  Performed at [facility], [place] [address], [place], [place] [address]   MRSA PCR Screening     Status: None   Collection Time: 01/23/21  6:58 AM   Specimen: Nasopharyngeal  Result  Value Ref Range Status   MRSA by PCR NEGATIVE NEGATIVE Final    Comment:        The GeneXpert MRSA Assay (FDA approved for NASAL specimens only), is one component of a comprehensive MRSA colonization surveillance program. It is not intended to diagnose MRSA infection nor to guide or monitor treatment for MRSA infections. Performed at [facility], [place] [address], [place], [place] [address]          Radiology Studies: CT ABDOMEN PELVIS WO CONTRAST  Result Date: 01/23/2021 CLINICAL DATA:  Generalized weakness 2 recent falls. EXAM: CT ABDOMEN AND PELVIS WITHOUT CONTRAST TECHNIQUE: Multidetector CT imaging of the abdomen and pelvis was performed following the standard protocol without IV contrast. COMPARISON:  04/04/2018 FINDINGS: Lower chest: Cardiomegaly. Partially covered transcatheter aortic valve replacement and EKG leads. Large right and small left pleural effusions with atelectasis. Hepatobiliary: No focal liver abnormality.Very dilated gallbladder with layering high-density material. No regional inflammation or ductal dilatation. Pancreas: Unremarkable. Spleen: Unremarkable. Adrenals/Urinary Tract: Negative adrenals. No hydronephrosis or ureteral stone. 9 mm left upper pole renal calculus. Symmetric renal thinning. Unremarkable bladder. Stomach/Bowel: Thickening of the rectum with submucosal low-density appearance. There is mild stool distending the rectum.  Sigmoid diverticulosis. Vascular/Lymphatic: No acute vascular abnormality. 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 Atheromatous plaque is extensive. Bifemoral bypass which is unremarkable. No mass or adenopathy. Reproductive:Symmetric enlargement with dystrophic calcification. Other: No ascites or pneumoperitoneum. Musculoskeletal: 2 cm hematoma in the medial and lower right gluteus maximus. Advanced spinal degeneration with scoliosis. There is separate dedicated reformats. IMPRESSION: 1. Proctitis. 2. Related to fall, there is a small hematoma in the right gluteus maximus. 3. Chronic pleural effusions, currently large on the right and small on the left. 4. Cholelithiasis with very dilated gallbladder but no acute inflammation or ductal dilatation. 5. Additional chronic findings described above. Electronically Signed   By: Monte Fantasia M.D.   On: 01/23/2021 04:31   CT HEAD WO CONTRAST  Result Date: 01/23/2021 CLINICAL DATA:  Follow-up subdural hematoma.  Recent falls. EXAM: CT HEAD WITHOUT CONTRAST TECHNIQUE: Contiguous axial images were obtained from the base of the skull through the vertex without intravenous contrast. COMPARISON:  CT head 01/23/2021, 09/07/2018 FINDINGS: Brain: Small interhemispheric subdural hematoma anteriorly is unchanged from earlier today. This was not present on the study from 2020 and therefore appears acute. No other intracranial hemorrhage Moderate to advanced atrophy. Chronic ischemic changes are seen throughout the white matter. 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 Small chronic infarct right superior cerebellum. Negative for acute infarct or mass. Vascular: Negative for hyperdense vessel. Atherosclerotic calcification. Skull: Negative Sinuses/Orbits: Paranasal sinuses clear. Bilateral cataract extraction Other: None IMPRESSION: Small anterior interhemispheric subdural hematoma unchanged from earlier today. Moderate to advanced atrophy.  Chronic ischemic  changes. Electronically Signed   By: Franchot Gallo M.D.   On: 01/23/2021 15:00   CT Head Wo Contrast  Result Date: 01/23/2021 CLINICAL DATA:  General weakness with 2 recent falls per family, pt on blood thinners, hx of HTN and skin ca, prev ct head and c-spine 09/07/18 EXAM: CT HEAD WITHOUT CONTRAST CT CERVICAL SPINE WITHOUT CONTRAST TECHNIQUE: Multidetector CT imaging of the head and cervical spine was performed following the standard protocol without intravenous contrast. Multiplanar CT image reconstructions of the cervical spine were also generated. COMPARISON:  None. FINDINGS: CT HEAD FINDINGS Brain: Patchy and confluent areas of decreased attenuation are noted throughout the deep and periventricular white matter of the cerebral hemispheres bilaterally,  compatible with chronic microvascular ischemic disease. No evidence of large-territorial acute infarction. No parenchymal hemorrhage. No mass lesion. No extra-axial collection. No mass effect or midline shift. No hydrocephalus. Basilar cisterns are patent. Hyper to iso dense thickening along the left falx cerebri measuring up to 3 mm (5:11). Vascular: No hyperdense vessel. Skull: No acute fracture or focal lesion. Atherosclerotic calcifications are present within the cavernous internal carotid and vertebral arteries. Sinuses/Orbits: Paranasal sinuses and mastoid air cells are clear. Bilateral trace pleural effusions. Orbits are unremarkable. Other: None. CT CERVICAL SPINE FINDINGS Alignment: Grade 1 anterolisthesis of C3 on C4. Skull base and vertebrae: Multilevel degenerative changes of the spine. No acute fracture. No aggressive appearing focal osseous lesion or focal pathologic process. Soft tissues and spinal canal: No prevertebral fluid or swelling. No visible canal hematoma. Upper chest: At least moderate volume right pleural effusion. Other: None. IMPRESSION: 1. Question acute to subacute 3 mm left parafalcine subdural hematoma. Recommend follow-up CT  in 6 hours. 2. No acute displaced fracture or traumatic listhesis of the cervical spine. 3. At least moderate volume right pleural effusion. These results will be called to the ordering clinician or representative by the Radiologist Assistant, and communication documented in the PACS or [employer]. Electronically Signed   By: Iven Finn M.D.   On: 01/23/2021 04:23   CT Chest Wo Contrast  Result Date: 01/23/2021 CLINICAL DATA:  Chest trauma with pleural effusion. On blood thinners, rule out hemothorax. EXAM: CT CHEST WITHOUT CONTRAST TECHNIQUE: Multidetector CT imaging of the chest was performed following the standard protocol without IV contrast. COMPARISON:  None. FINDINGS: Cardiovascular: Mildly enlarged heart size. CABG, transcatheter aortic valve replacement, and pacer implant. Extensive atheromatous calcification. Mediastinum/Nodes: No hematoma or pneumomediastinum. Lungs/Pleura: Large right and small left pleural effusions. There is extensive artifact related to arms down positioning but both effusions are layering and appear fluid density where least affected by artifact. Dependent atelectasis. Upper Abdomen: Partially covered left nephrolithiasis. Musculoskeletal: Diffuse spinal degeneration with scoliosis. No acute finding. IMPRESSION: 1. No visible injury to the chest. 2. Large right and small left pleural effusions which are layering and simple appearing. Electronically Signed   By: Monte Fantasia M.D.   On: 01/23/2021 06:32   CT Cervical Spine Wo Contrast  Result Date: 01/23/2021 CLINICAL DATA:  General weakness with 2 recent falls per family, pt on blood thinners, hx of HTN and skin ca, prev ct head and c-spine 09/07/18 EXAM: CT HEAD WITHOUT CONTRAST CT CERVICAL SPINE WITHOUT CONTRAST TECHNIQUE: Multidetector CT imaging of the head and cervical spine was performed following the standard protocol without intravenous contrast. Multiplanar CT image reconstructions of the cervical spine  were also generated. COMPARISON:  None. FINDINGS: CT HEAD FINDINGS Brain: Patchy and confluent areas of decreased attenuation are noted throughout the deep and periventricular white matter of the cerebral hemispheres bilaterally, compatible with chronic microvascular ischemic disease. No evidence of large-territorial acute infarction. No parenchymal hemorrhage. No mass lesion. No extra-axial collection. No mass effect or midline shift. No hydrocephalus. Basilar cisterns are patent. Hyper to iso dense thickening along the left falx cerebri measuring up to 3 mm (5:11). Vascular: No hyperdense vessel. Skull: No acute fracture or focal lesion. Atherosclerotic calcifications are present within the cavernous internal carotid and vertebral arteries. Sinuses/Orbits: Paranasal sinuses and mastoid air cells are clear. Bilateral trace pleural effusions. Orbits are unremarkable. Other: None. CT CERVICAL SPINE FINDINGS Alignment: Grade 1 anterolisthesis of C3 on C4. Skull base and vertebrae: Multilevel degenerative changes of the  spine. No acute fracture. No aggressive appearing focal osseous lesion or focal pathologic process. Soft tissues and spinal canal: No prevertebral fluid or swelling. No visible canal hematoma. Upper chest: At least moderate volume right pleural effusion. Other: None. IMPRESSION: 1. Question acute to subacute 3 mm left parafalcine subdural hematoma. Recommend follow-up CT in 6 hours. 2. No acute displaced fracture or traumatic listhesis of the cervical spine. 3. At least moderate volume right pleural effusion. These results will be called to the ordering clinician or representative by the Radiologist Assistant, and communication documented in the PACS or [employer]. Electronically Signed   By: Iven Finn M.D.   On: 01/23/2021 04:23   CT Lumbar Spine Wo Contrast  Result Date: 01/23/2021 CLINICAL DATA:  Weakness and falls. EXAM: CT LUMBAR SPINE WITHOUT CONTRAST TECHNIQUE: Reformats of the  lumbar spine were generated from CT of the abdomen and pelvis. COMPARISON:  None. FINDINGS: Segmentation: 5 lumbar type vertebrae. Alignment: Scoliosis and L4-5 anterolisthesis. Vertebrae: No evidence of fracture or bone lesion. Paraspinal and other soft tissues: Reported separately Disc levels: Generalized disc narrowing, endplate ridging, and disc fissuring. Multilevel facet osteoarthritis with spurring. Notable foraminal impingement on the left at L4-5. Likely moderate spinal stenosis at L2-3 to L4-5. IMPRESSION: 1. Negative for lumbar spine fracture. 2. Advanced spinal degeneration with scoliosis and L4-5 anterolisthesis. Electronically Signed   By: Monte Fantasia M.D.   On: 01/23/2021 04:34   DG Chest Portable 1 View  Addendum Date: 01/23/2021   ADDENDUM REPORT: 01/23/2021 04:16 ADDENDUM: These results will be called to the ordering clinician or representative by the Radiologist Assistant, and communication documented in the PACS or [employer]. Electronically Signed   By: Iven Finn M.D.   On: 01/23/2021 04:16   Addendum Date: 01/23/2021   ADDENDUM REPORT: 01/23/2021 04:10 ADDENDUM: Right pleural effusion likely of at least moderate volume. Electronically Signed   By: Iven Finn M.D.   On: 01/23/2021 04:10   Result Date: 01/23/2021 CLINICAL DATA:  Status post multiple falls. EXAM: PORTABLE CHEST 1 VIEW.  Patient is rotated. COMPARISON:  Chest x-ray 09/07/2018 FINDINGS: The heart size and mediastinal contours are unchanged. Aortic arch calcification. Status post aortic valve replacement. Prominence of the hilar vasculature. Limited evaluation of the right apex due to overlying mandible. Bibasilar airspace opacities. No pulmonary edema. Interval development of bilateral trace, right greater than left, pleural effusions. No pneumothorax. No acute osseous abnormality. IMPRESSION: Interval development of bilateral trace, right greater than left, pleural effusions. Bibasilar airspace  opacities likely represent atelectasis with superimposed infection/inflammation not excluded. Electronically Signed: By: Iven Finn M.D. On: 01/23/2021 03:24        Scheduled Meds: . amLODipine  2.5 mg Oral Daily  . atorvastatin  20 mg Oral QODAY  . Chlorhexidine Gluconate Cloth  6 each Topical Q0600  . escitalopram  10 mg Oral Daily  . ferrous sulfate  325 mg Oral Daily  . finasteride  5 mg Oral Once per day on Sun Mon Tue Thu Fri Sat  . furosemide  40 mg Oral Daily  . hydrocerin   Topical Daily  . latanoprost  1 drop Both Eyes QHS  . mouth rinse  15 mL Mouth Rinse BID  . metoprolol succinate  50 mg Oral Q breakfast  . multivitamin with minerals  1 tablet Oral Daily  . vancomycin  125 mg Oral QID   Continuous Infusions:   LOS: 1 day    Time spent: 40 minutes    Quillian Quince  [place] Silos, MD [facility]   To contact the attending provider between 7A-7P or the covering provider during after hours 7P-7A, please log into the web site [email or web address] and access using universal [place] password for that web site. If you do not have the password, please call the hospital operator.  01/24/2021, 3:08 PM

## 2021-01-24 NOTE — NC FL2 (Signed)
[place] LEVEL OF CARE SCREENING TOOL     IDENTIFICATION  Patient Name: Dalton Townsend Birthdate: 11-May-1923 Sex: male Admission Date (Current Location): 01/22/2021  [facility] and [place] Number:  Herbalist and Address:  [facility],  [address], [place]      Provider Number: [id number]  Attending Physician Name and Address:  Eugenie Filler, MD  Relative Name and Phone Number:  Merlen, Gurry [facility])   [phone]    Current Level of Care: Hospital Recommended Level of Care: [place] Prior Approval Number:    Date Approved/Denied:   PASRR Number: [id number]  Discharge Plan: SNF    Current Diagnoses: Patient Active Problem List   Diagnosis Date Noted  . Subdural hematoma ([place]) 01/23/2021  . 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 S/P femoral-femoral bypass surgery   . Aortic stenosis 04/11/2018  . Presence of permanent cardiac pacemaker   . PAF (paroxysmal atrial fibrillation) ([place])   . Carotid artery disease ([place])   . CAD (coronary artery disease)   . BPH (benign prostatic hyperplasia)   . GAD (generalized anxiety disorder)   . Acute on chronic congestive heart failure ([place]) 04/02/2018  . Severe aortic stenosis 01/04/2014  . HTN (hypertension) 06/24/2010  . Ulcerative colitis ([place]) 06/24/2010    Orientation RESPIRATION BLADDER Height & Weight     Self,Time,Situation,Place  Normal Continent,External catheter Weight: 169 lb 12.1 oz (77 kg) Height:  5' 11"  (180.3 cm)  BEHAVIORAL SYMPTOMS/MOOD NEUROLOGICAL BOWEL NUTRITION STATUS      Incontinent Diet  AMBULATORY STATUS COMMUNICATION OF NEEDS Skin   Limited Assist Verbally Skin abrasions,Bruising,Other (Comment) (Skin tear to right buttocks- foam dressing (lifted every shift to cheack), skin tears to lower right arm- dressed with guaze.)                       Personal Care Assistance Level of Assistance   Bathing,Feeding,Dressing,Total care Bathing Assistance: Maximum assistance Feeding assistance: Independent Dressing Assistance: Maximum assistance Total Care Assistance: Maximum assistance   Functional Limitations Info  Hearing,Speech,Sight Sight Info: Adequate Hearing Info: Adequate Speech Info: Adequate    SPECIAL CARE FACTORS FREQUENCY  PT (By licensed PT),OT (By licensed OT)     PT Frequency: 5 times weekly OT Frequency: 5 times weekly            Contractures Contractures Info: Not present    Additional Factors Info  Code Status,Allergies Code Status Info: DNR Allergies Info: NKA           Current Medications (01/24/2021):  This is the current hospital active medication list Current Facility-Administered Medications  Medication Dose Route Frequency Provider Last Rate Last Admin  . acetaminophen (TYLENOL) tablet 650 mg  650 mg Oral Q6H PRN Marylyn Ishihara, Tyrone A, DO   650 mg at 01/23/21 2200   Or  . acetaminophen (TYLENOL) suppository 650 mg  650 mg Rectal Q6H PRN Cherylann Ratel A, DO      . amLODipine (NORVASC) tablet 2.5 mg  2.5 mg Oral Daily Kyle, Tyrone A, DO   2.5 mg at 01/24/21 0947  . atorvastatin (LIPITOR) tablet 20 mg  20 mg Oral QODAY Kyle, Tyrone A, DO   20 mg at 01/24/21 0947  . Chlorhexidine Gluconate Cloth 2 % PADS 6 each  6 each Topical Q0600 Cherylann Ratel A, DO   6 each at 01/24/21 0954  . escitalopram (LEXAPRO) tablet 10 mg  10 mg Oral Daily  Cherylann Ratel A, DO   10 mg at 01/24/21 0947  . ferrous sulfate tablet 325 mg  325 mg Oral Daily Kyle, Tyrone A, DO   325 mg at 01/24/21 0947  . finasteride (PROSCAR) tablet 5 mg  5 mg Oral Once per day on Sun Mon Tue Thu Fri Sat Cherylann Ratel A, DO   5 mg at 01/24/21 1694  . furosemide (LASIX) tablet 40 mg  40 mg Oral Daily Eugenie Filler, MD      . hydrALAZINE (APRESOLINE) injection 5 mg  5 mg Intravenous Q6H PRN Hollace Hayward K, NP   5 mg at 01/23/21 2229  . hydrocerin (EUCERIN) cream   Topical Daily Cherylann Ratel A,  DO   Given at 01/24/21 5038  . latanoprost (XALATAN) 0.005 % ophthalmic solution 1 drop  1 drop Both Eyes QHS Kyle, Tyrone A, DO   1 drop at 01/23/21 2150  . MEDLINE mouth rinse  15 mL Mouth Rinse BID Marylyn Ishihara, Tyrone A, DO   15 mL at 01/24/21 0954  . metoprolol succinate (TOPROL-XL) 24 hr tablet 50 mg  50 mg Oral Q breakfast Marylyn Ishihara, Tyrone A, DO   50 mg at 01/24/21 8828  . multivitamin with minerals tablet 1 tablet  1 tablet Oral Daily Kyle, Tyrone A, DO   1 tablet at 01/24/21 0947  . vancomycin (VANCOCIN) capsule 125 mg  125 mg Oral QID Marylyn Ishihara, Tyrone A, DO   125 mg at 01/24/21 0034     Discharge Medications: Please see discharge summary for a list of discharge medications.  Relevant Imaging Results:  Relevant Lab Results:   Additional Information SSN: [id number] [place]

## 2021-01-24 NOTE — Consult Note (Signed)
NAME:  Dalton Townsend, MRN:  [id number], DOB:  [birth year], LOS: 1 ADMISSION DATE:  01/22/2021, CONSULTATION DATE:  01/24/21 REFERRING MD:  Irine Seal, MD CHIEF COMPLAINT:  Pleural Effusion  History of Present Illness:  87 year old male with atrial fibrillation on anticoagulation, complete heart block s/p pacemaker, and ulcerative colitis who was admitted 5/27 after suffering a fall at home. He has been found to have a small SDH. He also has history of diarrhea over recent weeks and found to be c. Diff positive.   Neurosurgery is following for the SDH and he is being treated with PO vanc for the c. Diff.   PCCM has been consulted for evaluation of right pleural effusion noted on chest radiograph  Pertinent  Medical History  Atrial Fibrillation Complete Heart Block s/p pacemaker Ulcerative colitis Severe aortic stenosis s/p tavr  Significant Hospital Events: Including procedures, antibiotic start and stop dates in addition to other pertinent events   . 5/27 admitted with SDH and c. Diff colitis  Interim History / Subjective:   Patient's daughter [name] is at the bedside. She reports he is more confused today. We discussed the pleural effusion and bedside ultrasound was performed which showed large simple appearing pleural effusion. She requested we hold off on further procedures at this time until they speak with palliative care.  Objective   Blood pressure (!) 144/39, pulse 60, temperature (!) 97 F (36.1 C), temperature source Oral, resp. rate 17, height 5' 11"  (1.803 m), weight 77 kg, SpO2 94 %.        Intake/Output Summary (Last 24 hours) at 01/24/2021 1404 Last data filed at 01/24/2021 0600 Gross per 24 hour  Intake 324.19 ml  Output 610 ml  Net -285.81 ml   Filed Weights   01/22/21 2343 01/23/21 0700  Weight: 81 kg 77 kg    Examination: General: elderly male, no acute distress HENT: [place]/AT, moist mucous membranes, sclera anicteric Lungs: Diminished breath  sounds.  No wheezing or rhonchi. Cardiovascular: Regular rate and rhythm, no murmurs. Abdomen: Soft, nontender, nondistended, bowel sounds present. Extremities:, No edema. Neuro: Alert, moving all extremities, oriented to person GU: Deferred  Labs/imaging that I have personally reviewed  (right click and "Reselect all SmartList Selections" daily)  CT chest 5/27: Large right pleural effusion with compressive atelectasis.  BMP reviewed, creatinine 1.11 CBC reviewed, hemoglobin 9.9 previously 11 on 10/31/2020, platelets 219 INR 1.2  Echo 05/14/2019: LVEF 60-65%.  Moderate reduction in RV systolic function.  RVSP is normal.  Resolved Hospital Problem list     Assessment & Plan:   Right pleural effusion - Based on bedside ultrasound the effusion is moderate to large and simple appearing. -The differential includes possible hemothorax given a history of being on anticoagulation and recent trauma along with a 2-3 point drop in his hemoglobin.  Other etiologies include possible parapneumonic effusion versus transudative process due to heart failure. -Based on patient's daughter's wishes we will hold off on thoracentesis at this time until further discussions are had by the palliative care team. -A trial of diuretic therapy is reasonable with monitoring for improvement via chest radiographs. -PCCM will continue to follow and available to perform thoracentesis if needed.   Labs   CBC: Recent Labs  Lab 01/23/21 0255 01/24/21 0121  WBC 9.6 7.7  HGB 10.5* 9.9*  HCT 33.4* 31.0*  MCV 94.1 93.9  PLT 205 409    Basic Metabolic Panel: Recent Labs  Lab 01/23/21 0255 01/23/21 1152 01/23/21 1550 01/24/21  0121  NA 140  --  139 137  K 2.8*  --  3.6 3.8  CL 106  --  108 107  CO2 27  --  24 23  GLUCOSE 116*  --  132* 115*  BUN 39*  --  35* 35*  CREATININE 1.33*  --  1.46* 1.11  CALCIUM 8.9  --  8.2* 8.2*  MG  --  2.0  --   --   PHOS  --   --  2.7  --    GFR: Estimated Creatinine  Clearance: 42.4 mL/min (by C-G formula based on SCr of 1.11 mg/dL). Recent Labs  Lab 01/23/21 0255 01/24/21 0121  WBC 9.6 7.7    Liver Function Tests: Recent Labs  Lab 01/23/21 0255 01/23/21 1550 01/24/21 0121  AST 20  --  27  ALT 13  --  13  ALKPHOS 58  --  50  BILITOT 0.9  --  0.5  PROT 5.9*  --  5.2*  ALBUMIN 2.6* 2.3* 2.1*   Recent Labs  Lab 01/23/21 0255  LIPASE 20   No results for input(s): AMMONIA in the last 168 hours.  ABG    Component Value Date/Time   PHART 7.402 04/18/2018 1103   PCO2ART 37.4 04/18/2018 1103   PO2ART 118.0 (H) 04/18/2018 1103   HCO3 23.6 04/18/2018 1103   TCO2 26 09/07/2018 1800   ACIDBASEDEF 1.0 04/18/2018 1103   O2SAT 99.0 04/18/2018 1103     Coagulation Profile: Recent Labs  Lab 01/24/21 1220  INR 1.2    Cardiac Enzymes: No results for input(s): CKTOTAL, CKMB, CKMBINDEX, TROPONINI in the last 168 hours.  HbA1C: Hgb A1c MFr Bld  Date/Time Value Ref Range Status  04/07/2018 09:52 AM 5.4 4.8 - 5.6 % Final    Comment:    (NOTE) Pre diabetes:          5.7%-6.4% Diabetes:              >6.4% Glycemic control for   <7.0% adults with diabetes     CBG: Recent Labs  Lab 01/23/21 0200  GLUCAP 107*    Review of Systems:   A 10 point review of systems was performed which is otherwise negative unless stated in the HPI.  All information obtained from the patient's daughter at the bedside which is stated in HPI.  Past Medical History:  He,  has a past medical history of Acute on chronic congestive heart failure ([place]) (04/02/2018), Aortic stenosis (04/11/2018), BPH (benign prostatic hyperplasia), CAD (coronary artery disease), Carotid artery disease ([place]), Dyslipidemia, Essential hypertension, GAD (generalized anxiety disorder), HTN (hypertension) (06/24/2010), Intestinal infection due to Clostridium difficile (03/2010), PAF (paroxysmal atrial fibrillation) ([place]), Presence of permanent cardiac pacemaker, PVD (peripheral vascular  disease) ([place]), S/P femoral-femoral bypass surgery, S/P TAVR (transcatheter aortic valve replacement), Severe aortic stenosis, Skin cancer, Ulcerative colitis, and Ulcerative colitis ([place]) (06/24/2010).   Surgical History:   Past Surgical History:  Procedure Laterality Date  . BALLOON AORTIC VALVE VALVULOPLASTY N/A 04/11/2018   Procedure: BALLOON AORTIC VALVE VALVULOPLASTY.  RIGHT EXTERNAL ILIAC ARTERY OVERSEW.;  Surgeon: Sherren Mocha, MD;  Location: [facility];  Service: Open Heart Surgery;  Laterality: N/A;  . CARDIAC CATHETERIZATION  1999  . CAROTID ENDARTERECTOMY Right 2004  . CATARACT EXTRACTION, BILATERAL  2011  . CORONARY ARTERY BYPASS GRAFT  04/1998   3 vessel CAD  . ENDARTERECTOMY FEMORAL Right 04/11/2018   Procedure: ENDARTERECTOMY FEMORAL;  Surgeon: Sherren Mocha, MD;  Location: [facility];  Service: Open  Heart Surgery;  Laterality: Right;  . FEMORAL-FEMORAL BYPASS GRAFT N/A 04/11/2018   Procedure: LEFT TO RIGHT BYPASS GRAFT FEMORAL-FEMORAL ARTERY;  Surgeon: Sherren Mocha, MD;  Location: [facility];  Service: Open Heart Surgery;  Laterality: N/A;  . INCISION AND DRAINAGE OF WOUND Left 05/04/2016   Procedure: SURGICAL PREP FOR GRAFTING LEFT LEG WITH THERA SKIN APPLICATION;  Surgeon: Irene Limbo, MD;  Location: [facility];  Service: Plastics;  Laterality: Left;  . INTRAOPERATIVE ARTERIOGRAM  06/2000  . INTRAOPERATIVE TRANSTHORACIC ECHOCARDIOGRAM  04/11/2018   Procedure: INTRAOPERATIVE TRANSTHORACIC ECHOCARDIOGRAM;  Surgeon: Sherren Mocha, MD;  Location: [facility] OR;  Service: Open Heart Surgery;;  . PACEMAKER INSERTION  03/25/2010  . RIGHT/LEFT HEART CATH AND CORONARY/GRAFT ANGIOGRAPHY N/A 04/05/2018   Procedure: RIGHT/LEFT HEART CATH AND CORONARY/GRAFT ANGIOGRAPHY;  Surgeon: Burnell Blanks, MD;  Location: [facility];  Service: Cardiovascular;  Laterality: N/A;  . TEE WITHOUT CARDIOVERSION N/A 04/18/2018   Procedure: TRANSESOPHAGEAL ECHOCARDIOGRAM (TEE);  Surgeon: Sherren Mocha, MD;   Location: [facility];  Service: Open Heart Surgery;  Laterality: N/A;  . TOTAL KNEE ARTHROPLASTY  07/16/09  . TRANSCATHETER AORTIC VALVE REPLACEMENT, TRANSAPICAL N/A 04/18/2018   Procedure: TRANSCATHETER AORTIC VALVE REPLACEMENT, TRANSAPICAL. 97m EDWARDS SAPIEN 3 TRANSCATHETER HEART VALVE.;  Surgeon: CSherren Mocha MD;  Location: [facility]  Service: Open Heart Surgery;  Laterality: N/A;  . TRANSURETHRAL RESECTION OF PROSTATE  1998     Social History:   reports that he quit smoking about 68 years ago. His smoking use included cigarettes. He quit after 10.00 years of use. He has quit using smokeless tobacco. He reports that he does not drink alcohol and does not use drugs.   Family History:  His family history includes Cancer in an other family member; Congestive Heart Failure in his mother; Heart failure in an other family member; Leukemia in his father and another family member.   Allergies Allergies  Allergen Reactions  . No Known Allergies      Home Medications  Prior to Admission medications   Medication Sig Start Date End Date Taking? Authorizing Provider  acetaminophen (TYLENOL) 500 MG tablet Take 1,000 mg by mouth at bedtime.   Yes [provider]  amLODipine (NORVASC) 5 MG tablet Take 2.5 mg by mouth daily.   Yes [provider]  amoxicillin (AMOXIL) 500 MG capsule Take 500 mg by mouth 4 (four) times daily as needed. Patient reports taking 4 capsules before any dental procedure.   Yes [provider]  aspirin 81 MG chewable tablet Chew 1 tablet (81 mg total) by mouth daily. 04/22/18  Yes Barrett, Erin R, PA-C  atorvastatin (LIPITOR) 20 MG tablet Take 20 mg by mouth every other day.    Yes [provider]  balsalazide (COLAZAL) 750 MG capsule Take 2,250 mg by mouth 3 (three) times daily.   Yes [provider]  ELIQUIS 5 MG TABS tablet TAKE 1 TABLET TWICE A DAY Patient taking differently: Take 5 mg by mouth 2 (two) times daily. 06/11/20  Yes  SJerline Pain MD  escitalopram (LEXAPRO) 10 MG tablet Take 10 mg by mouth daily.   Yes [provider]  ferrous sulfate 325 (65 FE) MG tablet Take 325 mg by mouth daily.   Yes [provider]  finasteride (PROSCAR) 5 MG tablet Take 5 mg by mouth See admin instructions. Take one tablet (5 mg) by mouth every day except Wednesdays 01/13/15  Yes [provider]  furosemide (LASIX) 40 MG tablet Take 2  tablets (80 mg total) by mouth daily. Patient taking differently: Take 40 mg by mouth daily. 04/24/20  Yes Burtis Junes, NP  latanoprost (XALATAN) 0.005 % ophthalmic solution Place 1 drop into both eyes at bedtime.    Yes [provider]  losartan (COZAAR) 100 MG tablet Take 1 tablet (100 mg total) by mouth daily. 05/17/18  Yes Eileen Stanford, PA-C  mesalamine (CANASA) 1000 MG suppository Place 1,000 mg rectally at bedtime as needed (ulcerative colitis).   Yes [provider]  metoprolol succinate (TOPROL-XL) 50 MG 24 hr tablet Take 50 mg by mouth daily. Take with or immediately following a meal.   Yes [provider]  Multiple Vitamin (MULTIVITAMIN WITH MINERALS) TABS tablet Take 1 tablet by mouth daily.   Yes [provider]     Critical care time: n/a    Freda Jackson, MD [place] Office: [phone]   See Amion for personal pager PCCM on call pager [phone] until 7pm. Please call Elink 7p-7a. [phone]

## 2021-01-25 DIAGNOSIS — Z7189 Other specified counseling: Secondary | ICD-10-CM

## 2021-01-25 DIAGNOSIS — S065X9A Traumatic subdural hemorrhage with loss of consciousness of unspecified duration, initial encounter: Secondary | ICD-10-CM | POA: Diagnosis not present

## 2021-01-25 DIAGNOSIS — R531 Weakness: Secondary | ICD-10-CM | POA: Diagnosis not present

## 2021-01-25 DIAGNOSIS — D649 Anemia, unspecified: Secondary | ICD-10-CM | POA: Diagnosis not present

## 2021-01-25 DIAGNOSIS — Z515 Encounter for palliative care: Secondary | ICD-10-CM | POA: Diagnosis not present

## 2021-01-25 DIAGNOSIS — N400 Enlarged prostate without lower urinary tract symptoms: Secondary | ICD-10-CM | POA: Diagnosis not present

## 2021-01-25 DIAGNOSIS — A0472 Enterocolitis due to Clostridium difficile, not specified as recurrent: Secondary | ICD-10-CM | POA: Diagnosis not present

## 2021-01-25 LAB — CBC WITH DIFFERENTIAL/PLATELET
Abs Immature Granulocytes: 0.03 10*3/uL (ref 0.00–0.07)
Basophils Absolute: 0 10*3/uL (ref 0.0–0.1)
Basophils Relative: 0 %
Eosinophils Absolute: 0.1 10*3/uL (ref 0.0–0.5)
Eosinophils Relative: 1 %
HCT: 31.3 % — ABNORMAL LOW (ref 39.0–52.0)
Hemoglobin: 9.5 g/dL — ABNORMAL LOW (ref 13.0–17.0)
Immature Granulocytes: 0 %
Lymphocytes Relative: 16 %
Lymphs Abs: 1.1 10*3/uL (ref 0.7–4.0)
MCH: 29.3 pg (ref 26.0–34.0)
MCHC: 30.4 g/dL (ref 30.0–36.0)
MCV: 96.6 fL (ref 80.0–100.0)
Monocytes Absolute: 0.5 10*3/uL (ref 0.1–1.0)
Monocytes Relative: 7 %
Neutro Abs: 5.2 10*3/uL (ref 1.7–7.7)
Neutrophils Relative %: 76 %
Platelets: 217 10*3/uL (ref 150–400)
RBC: 3.24 MIL/uL — ABNORMAL LOW (ref 4.22–5.81)
RDW: 17.8 % — ABNORMAL HIGH (ref 11.5–15.5)
WBC: 6.8 10*3/uL (ref 4.0–10.5)
nRBC: 0 % (ref 0.0–0.2)

## 2021-01-25 LAB — BASIC METABOLIC PANEL
Anion gap: 8 (ref 5–15)
BUN: 33 mg/dL — ABNORMAL HIGH (ref 8–23)
CO2: 21 mmol/L — ABNORMAL LOW (ref 22–32)
Calcium: 8.2 mg/dL — ABNORMAL LOW (ref 8.9–10.3)
Chloride: 111 mmol/L (ref 98–111)
Creatinine, Ser: 1.22 mg/dL (ref 0.61–1.24)
GFR, Estimated: 55 mL/min — ABNORMAL LOW (ref >60)
Glucose, Bld: 99 mg/dL (ref 70–99)
Potassium: 3.5 mmol/L (ref 3.5–5.1)
Sodium: 140 mmol/L (ref 135–145)

## 2021-01-25 LAB — GLUCOSE, CAPILLARY: Glucose-Capillary: 93 mg/dL (ref 70–99)

## 2021-01-25 LAB — MAGNESIUM: Magnesium: 1.8 mg/dL (ref 1.7–2.4)

## 2021-01-25 MED ORDER — SODIUM CHLORIDE 0.9 % IV SOLN: INTRAVENOUS | Status: DC

## 2021-01-25 MED ORDER — POTASSIUM CHLORIDE CRYS ER 20 MEQ PO TBCR
40.0000 meq | EXTENDED_RELEASE_TABLET | Freq: Once | ORAL | Status: AC
  Administered 2021-01-25: 40 meq via ORAL
  Filled 2021-01-25: qty 2

## 2021-01-25 MED ORDER — SODIUM CHLORIDE 0.9% FLUSH
10.0000 mL | Freq: Two times a day (BID) | INTRAVENOUS | Status: DC
  Administered 2021-01-25 – 2021-01-28 (×7): 10 mL via INTRAVENOUS

## 2021-01-25 MED ORDER — ENSURE ENLIVE PO LIQD
237.0000 mL | Freq: Two times a day (BID) | ORAL | Status: DC
  Administered 2021-01-26 – 2021-01-29 (×6): 237 mL via ORAL

## 2021-01-25 MED ORDER — MAGNESIUM SULFATE 2 GM/50ML IV SOLN
2.0000 g | Freq: Once | INTRAVENOUS | Status: AC
  Administered 2021-01-25: 2 g via INTRAVENOUS
  Filled 2021-01-25: qty 50

## 2021-01-25 NOTE — Progress Notes (Signed)
Initial Nutrition Assessment  DOCUMENTATION CODES:   Not applicable  INTERVENTION:   Liberalize diet to REGULAR   Ensure Enlive po BID, each supplement provides 350 kcal and 20 grams of protein  MVI daily   NUTRITION DIAGNOSIS:   Increased nutrient needs related to acute illness as evidenced by estimated needs  GOAL:   Patient will meet greater than or equal to 90% of their needs  MONITOR:   PO intake,Supplement acceptance,Weight trends,Labs,I & O's  REASON FOR ASSESSMENT:   Malnutrition Screening Tool    ASSESSMENT:   Patient with PMH significant for CAD s/p CABG, dyslipidemia, essential HTN, PVD s/p femoral bypass, s/p TAVR, and UC. Presents this admission with SDH and bilateral pleural effusions.  Attempted to call patient in room, no answer. No meal completions charted at this time. Recommend liberalization of diet to provide more option given advanced age. RD to provide supplementation to maximize kcal and protein this admission.   Palliative met with patient and family. Plan to monitor PE and d/c to inpatient rehab if possible.   Records show a decline in weight from 81.6 kg on 3/4 to 77 kg this admission (5.8% wt loss in 1.5 months, significant for time frame). Suspect patient could be malnourished but unable to diagnose without NFPE or detailed history.   Medications: MVI with minerals  Labs: CBG 93-132  Diet Order:   Diet Order            Diet Heart Room service appropriate? Yes; Fluid consistency: Thin  Diet effective now                 EDUCATION NEEDS:   Not appropriate for education at this time  Skin:  Skin Assessment: Skin Integrity Issues: Skin Integrity Issues:: Other (Comment) Other: wounds- buttocks, arm, leg  Last BM:  5/29  Height:   Ht Readings from Last 1 Encounters:  01/23/21 5' 11" (1.803 m)    Weight:   Wt Readings from Last 1 Encounters:  01/23/21 77 kg    BMI:  Body mass index is 23.68 kg/m.  Estimated  Nutritional Needs:   Kcal:  2100-2300 kcal  Protein:  105-120 grams  Fluid:  >/= 2 L/day    RD, LDN Clinical Nutrition Pager listed in AMION  

## 2021-01-25 NOTE — Consult Note (Signed)
Consultation Note Date: 01/25/2021   Patient Name: Dalton Townsend  DOB: 1923-07-07  MRN: [id number]  Age / Sex: 87 y.o., male  PCP: Lajean Manes, MD Referring Physician: Eugenie Filler, MD  Reason for Consultation: Establishing goals of care  HPI/Patient Profile: 87 y.o. male   admitted on 01/22/2021     Clinical Assessment and Goals of Care: 87 year old gentleman who lives at home with his wife in [place], [place].  He has past medical history significant for atrial fibrillation for which he is on oral anticoagulation with Eliquis, history of complete heart block status post permanent pacemaker placement, history of hypertension, history of ulcerative colitis.  Patient presented to the emergency department with recent history of falls, functional decline, lower than usual oral intake.  Patient found to have subdural hematoma on head CT.  Admitted to hospital medicine service, found to have acute on chronic diarrhea which was worsening and C. difficile PCR was positive, patient in stepdown unit at [facility] in [place], [place] with oral vancomycin.  During additional work-up also found to have a large right-sided pleural effusion, pulmonary medicine consulted.  Underlying history also significant for severe aortic stenosis status post valve placement.  Advance care planning documents in [place] in epic note patient's daughter [name] as his designated healthcare power of attorney agent.  Palliative consultation for goals of care discussions, assistance with decision making in the hospital as well as assistance with disposition planning has been requested.  Elderly gentleman resting in bed.  He is awake and alert he is attempting to feed himself breakfast.  At present he is able to speak in full sentences he is on room air and does not appear to be in respiratory distress.  Daughter  Peggy at bedside.  Discussed with daughter about things from a palliative care perspective.  Subsequently a family meeting was held with the patient's wife and 2 daughters to discuss goals of care in much more detail.  Palliative medicine is specialized medical care for people living with serious illness. It focuses on providing relief from the symptoms and stress of a serious illness. The goal is to improve quality of life for both the patient and the family.  Goals of care: Broad aims of medical therapy in relation to the patient's values and preferences. Our aim is to provide medical care aimed at enabling patients to achieve the goals that matter most to them, given the circumstances of their particular medical situation and their constraints.    HCPOA Wife, has 3 daughters.  2 daughters live locally and are involved in caregiving.  Discussion/SUMMARY OF RECOMMENDATIONS   Initial discussions were undertaken with daughter [name] at bedside.  Subsequently patient's wife and another daughter also arrived and a family meeting was held.  Brief life review performed.  Patient's functional status and global assessment done.  Issues pertaining to current hospitalization discussed.  Goals wishes and values important to patient and family as a unit attempted to be explored.   1.  Decision has  been made to continue with clinical monitoring and observation of the patient's large pleural effusion for now.  Family request for attempts to continue diuresis as well as repeat chest x-ray imaging in a.m. and will likely proceed with thoracentesis should the patient have clinical signs/symptoms or should the patient have worsening effusion on chest x-ray. 2.We discussed about rehab facility on discharge.  Also discussed about [facility] inpatient rehab versus skilled nursing facility rehab attempt.  Will request TOC assistance. 3.  Continue current mode of care, continue monitoring of overall disease trajectory of illness  as well as hospital course to help best to determine appropriate options.  In this initial palliative consultation differences between hospice and palliative were undertaken.  In my opinion, patient will benefit from addition of palliative services as an extra layer of support towards discharge.  Palliative medicine team to follow. Code Status/Advance Care Planning:  DNR    Symptom Management:   As above  Palliative Prophylaxis:   Delirium Protocol   Psycho-social/Spiritual:   Desire for further Chaplaincy support:yes  Additional Recommendations: Caregiving  Support/Resources  Prognosis:   Unable to determine  Discharge Planning: [place] for rehab with Palliative care service follow-up      Primary Diagnoses: Present on Admission: . PAF (paroxysmal atrial fibrillation) ([place]) . Presence of permanent cardiac pacemaker . HTN (hypertension) . CAD (coronary artery disease) . BPH (benign prostatic hyperplasia) . C. difficile diarrhea . Anemia   I have reviewed the medical record, interviewed the patient and family, and examined the patient. The following aspects are pertinent.  Past Medical History:  Diagnosis Date  . Acute on chronic congestive heart failure ([place]) 04/02/2018  . Aortic stenosis 04/11/2018  . BPH (benign prostatic hyperplasia)   . CAD (coronary artery disease)    a. s/p CABG x6V in 1999, cath 03/2018 showed patent 6/6 bypass grafts  . Carotid artery disease ([place])    a. s/p R CEA, 80-99% restenosis of RICA, to be address after TAVR  . Dyslipidemia   . Essential hypertension   . GAD (generalized anxiety disorder)   . HTN (hypertension) 06/24/2010   Qualifier: Diagnosis of  By: Johnnye Sima MD, Dellis Filbert    . Intestinal infection due to Clostridium difficile 03/2010  . PAF (paroxysmal atrial fibrillation) ([facility])    a. on Eliquis  . Presence of permanent cardiac pacemaker   . PVD (peripheral vascular disease) ([place])   . S/P femoral-femoral  bypass surgery    a. emergent fem fem bypass due to iliac complication during first attempt TAVR placement on 04/11/18 by Dr. Scot Dock  . S/P TAVR (transcatheter aortic valve replacement)    a. 04/18/18: s/p succesful TAVR with a [place] 3 THV  . Severe aortic stenosis   . Skin cancer   . Ulcerative colitis   . Ulcerative colitis ([place]) 06/24/2010   Qualifier: Diagnosis of  By: Johnnye Sima MD, Dellis Filbert     Social History   Socioeconomic History  . Marital status: Married    Spouse name: Not on file  . Number of children: 3  . Years of education: Not on file  . Highest education level: Not on file  Occupational History  . Occupation: retired  Tobacco Use  . Smoking status: Former Smoker    Years: 10.00    Types: Cigarettes    Quit date: 08/30/1952    Years since quitting: 68.4  . Smokeless tobacco: Former Network engineer  . Vaping Use: Never used  Substance and Sexual  Activity  . Alcohol use: No  . Drug use: No  . Sexual activity: Not on file  Other Topics Concern  . Not on file  Social History Narrative  . Not on file   Social Determinants of Health   Financial Resource Strain: Not on file  Food Insecurity: No Food Insecurity  . Worried About Charity fundraiser in the Last Year: Never true  . Ran Out of Food in the Last Year: Never true  Transportation Needs: Not on file  Physical Activity: Not on file  Stress: Not on file  Social Connections: Not on file   Family History  Problem Relation Age of Onset  . Congestive Heart Failure Mother   . Leukemia Father   . Leukemia Other   . Heart failure Other   . Cancer Other    Scheduled Meds: . amLODipine  2.5 mg Oral Daily  . atorvastatin  20 mg Oral QODAY  . Chlorhexidine Gluconate Cloth  6 each Topical Q0600  . escitalopram  10 mg Oral Daily  . ferrous sulfate  325 mg Oral Daily  . finasteride  5 mg Oral Once per day on Sun Mon Tue Thu Fri Sat  . hydrocerin   Topical Daily  . latanoprost  1 drop Both Eyes  QHS  . mouth rinse  15 mL Mouth Rinse BID  . metoprolol succinate  50 mg Oral Q breakfast  . multivitamin with minerals  1 tablet Oral Daily  . vancomycin  125 mg Oral QID   Continuous Infusions: . sodium chloride 75 mL/hr at 01/25/21 1116   PRN Meds:.acetaminophen **OR** acetaminophen, hydrALAZINE Medications Prior to Admission:  Prior to Admission medications   Medication Sig Start Date End Date Taking? Authorizing Provider  acetaminophen (TYLENOL) 500 MG tablet Take 1,000 mg by mouth at bedtime.   Yes [provider]  amLODipine (NORVASC) 5 MG tablet Take 2.5 mg by mouth daily.   Yes [provider]  amoxicillin (AMOXIL) 500 MG capsule Take 500 mg by mouth 4 (four) times daily as needed. Patient reports taking 4 capsules before any dental procedure.   Yes [provider]  aspirin 81 MG chewable tablet Chew 1 tablet (81 mg total) by mouth daily. 04/22/18  Yes Barrett, Erin R, PA-C  atorvastatin (LIPITOR) 20 MG tablet Take 20 mg by mouth every other day.    Yes [provider]  balsalazide (COLAZAL) 750 MG capsule Take 2,250 mg by mouth 3 (three) times daily.   Yes [provider]  ELIQUIS 5 MG TABS tablet TAKE 1 TABLET TWICE A DAY Patient taking differently: Take 5 mg by mouth 2 (two) times daily. 06/11/20  Yes Jerline Pain, MD  escitalopram (LEXAPRO) 10 MG tablet Take 10 mg by mouth daily.   Yes [provider]  ferrous sulfate 325 (65 FE) MG tablet Take 325 mg by mouth daily.   Yes [provider]  finasteride (PROSCAR) 5 MG tablet Take 5 mg by mouth See admin instructions. Take one tablet (5 mg) by mouth every day except Wednesdays 01/13/15  Yes [provider]  furosemide (LASIX) 40 MG tablet Take 2 tablets (80 mg total) by mouth daily. Patient taking differently: Take 40 mg by mouth daily. 04/24/20  Yes Burtis Junes, NP  latanoprost (XALATAN) 0.005 % ophthalmic solution Place 1 drop into both eyes at bedtime.     Yes [provider]  losartan (COZAAR) 100 MG tablet Take 1 tablet (100 mg total) by  mouth daily. 05/17/18  Yes Eileen Stanford, PA-C  mesalamine (CANASA) 1000 MG suppository Place 1,000 mg rectally at bedtime as needed (ulcerative colitis).   Yes [provider]  metoprolol succinate (TOPROL-XL) 50 MG 24 hr tablet Take 50 mg by mouth daily. Take with or immediately following a meal.   Yes [provider]  Multiple Vitamin (MULTIVITAMIN WITH MINERALS) TABS tablet Take 1 tablet by mouth daily.   Yes [provider]   Allergies  Allergen Reactions  . No Known Allergies    Review of Systems No symptoms Physical Exam Sitting up in bed Is able to feed himself breakfast Is on room air Does have skin bruising both upper extremities No distress No focal deficits Monitor telemetry noted  Vital Signs: BP (!) 128/36   Pulse (!) 58   Temp 98.4 F (36.9 C) (Oral)   Resp 18   Ht 5' 11"  (1.803 m)   Wt 77 kg   SpO2 92%   BMI 23.68 kg/m  Pain Scale: 0-10   Pain Score: 7    SpO2: SpO2: 92 % O2 Device:SpO2: 92 % O2 Flow Rate: .O2 Flow Rate (L/min): 2 L/min  IO: Intake/output summary:   Intake/Output Summary (Last 24 hours) at 01/25/2021 1239 Last data filed at 01/25/2021 8527 Gross per 24 hour  Intake --  Output 520 ml  Net -520 ml    LBM: Last BM Date: 01/25/21 Baseline Weight: Weight: 81 kg Most recent weight: Weight: 77 kg     Palliative Assessment/Data:   50%  Time In:  11 Time Out:  12.10 Time Total:  70 Greater than 50%  of this time was spent counseling and coordinating care related to the above assessment and plan.  Signed by: Loistine Chance, MD   Please contact Palliative Medicine Team phone at [phone] for questions and concerns.  For individual provider: See Shea Evans

## 2021-01-25 NOTE — Progress Notes (Addendum)
PROGRESS NOTE    Dalton Townsend  TXM:[id number] DOB: [birth year] DOA: 01/22/2021 PCP: Dalton Manes, MD (Confirm with patient/family/NH records and if not entered, this HAS to be entered at [facility], The point of entry. "No PCP" if truly none.)   Chief Complaint  Patient presents with  . Weakness    Brief Narrative:  Patient 87 year old gentleman history of A. fib on chronic anticoagulation, complete heart block status post PPM, hypertension, UC presented to the ED with falls.  Patient noted to have a subdural hematoma with repeat head CT with no significant change.  Films assessed by neurosurgery who feel no further surgical intervention needed at this time.  Patient also noted to have acute on chronic diarrhea which has been worsening, C. difficile PCR was positive.  Patient placed on oral vancomycin.   Assessment & Plan:   Principal Problem:   SDH (subdural hematoma) (HCC) Active Problems:   C. difficile diarrhea   HTN (hypertension)   Severe aortic stenosis   Presence of permanent cardiac pacemaker   PAF (paroxysmal atrial fibrillation) (HCC)   CAD (coronary artery disease)   BPH (benign prostatic hyperplasia)   S/P TAVR (transcatheter aortic valve replacement)   Anemia   Bilateral pleural effusion   1  Subdural hematoma -Secondary to mechanical fall in the setting of anticoagulation. -EDP spoke with neurosurgery who reviewed CT head films x2 and felt repeat head CT was grossly stable, no need for neurosurgical intervention, reevaluation of need for anticoagulation and recommended at least holding aspirin and Eliquis for 2 weeks and okay to resume if needed. -Continue to hold anticoagulation of Eliquis and aspirin. -Patient noted to be more drowsy and lethargic yesterday evening and noted to have some confusion yesterday and as such repeat head CT done last night (5/28) with stable to slightly diminished 3 mm left anterior parafalcine subdural hematoma.  No new hemorrhage  visualized.  Atrophic and chronic small vessel ischemic changes of white matter.  Chronic infarcts in the right frontotemporal region and right cerebellum.  -Continue supportive care.   -PT/OT.   2.  C. difficile diarrhea -Patient noted to have presented with acute on chronic worsening diarrhea. -Patient with history of UC. -Patient with no recent exposure to antibiotics or C. difficile positive patients. -Continue oral vancomycin. -Gentle hydration normal saline 75 cc an hour for 1 day. -Supportive care.  3.  Bilateral pleural effusions R > L -Noted on CT chest and CT abdomen and pelvis. -Patient in no significant respiratory distress. -Pulmonary/PCCM consulted who reviewed films and assess patient and based on patient's daughter wishes to hold off on thoracentesis at this time until further discussions have been had by palliative care team.   -Patient started on Lasix yesterday however patient more on the dry side and as such we will discontinue Lasix.   -Patient currently on room air with sats in the low 90s.   -Monitor.   -Pulmonary/PCCM following and appreciate input and recommendations.   4.  Hypokalemia -Likely secondary to GI losses. -Potassium at 3.5.   -K-Dur 40 mEq p.o. x1.   -Magnesium at 1.8.   -Magnesium sulfate 2 g IV x1.   -Repeat labs in the morning.   5.  Anemia -Likely secondary to problem #1 and also chronic. -Patient with no overt bleeding. -Hemoglobin currently at 9.5.   -Transfusion threshold hemoglobin < 7.  6.  Hypertension -Continue Norvasc, Proscar, Toprol-XL.  -Continue to hold ARB. -Discontinue Lasix.   7.  History of complete heart block status  post PPM/paroxysmal A. Fib/severe aortic stenosis status post TAVR/CAD/chronic CHF -Currently rate controlled on Toprol-XL. -Hold ARB secondary to soft blood pressure. -Eliquis on hold secondary to problem #1. -Patient more on the dry side today, will discontinue Lasix.   8.  BPH -Proscar  9.   History of ulcerative colitis -Hold home regimen of mesalamine, balsalazide while treating acute C. difficile diarrhea. -Outpatient follow-up.  9.  Prognosis -Patient 87 year old gentleman with history of complete heart block status post PPM, paroxysmal atrial fibrillation, UC, presenting now with falls leading to a subdural hematoma and noted to be positive for C. difficile diarrhea.  Anticoagulation on hold. -Patient also noted to have some pleural effusions. -Patient with multiple comorbidities, advanced age. -Palliative care consultation pending.      DVT prophylaxis: SCD Code Status: DNR Family Communication: Updated patient and daughter, Dalton Townsend at bedside Disposition:   Status is: Inpatient    Dispo: The patient is from: Home              Anticipated d/c is to: TBD              Patient currently with subdural hematoma, C. difficile colitis, bilateral pleural effusions R > L, not stable for discharge   Difficult to place patient unknown       Consultants:   PCCM: Dr. Erin Townsend 01/24/2021  Neurosurgery: Dalton Townsend-reviewed CT findings 01/23/2021  Palliative care: Dr. Rowe Townsend  Procedures:   CT head x2 01/23/2021  CT head 01/24/2021  CT chest 01/23/2021  CT L-spine 01/23/2021  CT abdomen and pelvis 01/23/2021  CT C-spine 01/23/2021  Chest x-ray 01/23/2021  Antimicrobials:   Oral vancomycin 01/23/2021>>>> 02/02/2021   Subjective: Patient sitting up in bed looking at the menu.  Noted to have eaten a little bit of breakfast this morning.  More alert.  Oriented to self place and time.  Knows who the president is.  Denies any shortness of breath.  No chest pain.  States he is feeling a little bit better today.  Daughter at bedside. Per RN patient with 5 watery loose stools yesterday.  No bowel movement today.  Objective: Vitals:   01/25/21 0200 01/25/21 0400 01/25/21 0600 01/25/21 0800  BP: (!) 121/36 (!) 159/43 (!) 126/37   Pulse: 60 60 60   Resp: 18 (!) 24 20   Temp:   98.4 F (36.9 C)  98.6 F (37 C)  TempSrc:  Oral  Oral  SpO2: 93% 92% 92%   Weight:      Height:        Intake/Output Summary (Last 24 hours) at 01/25/2021 0958 Last data filed at 01/25/2021 8841 Gross per 24 hour  Intake --  Output 520 ml  Net -520 ml   Filed Weights   01/22/21 2343 01/23/21 0700  Weight: 81 kg 77 kg    Examination:  General exam: NAD.  Dry mucous membranes. Respiratory system: Decreased breath sounds in the bases right greater than left.  No wheezing.  No crackles.  Fair air movement.  Speaking in full sentences. Cardiovascular system: Regular rate and rhythm no murmurs rubs or gallops.  No JVD.  No lower extremity edema.  Gastrointestinal system: Abdomen is soft, nontender, nondistended, positive bowel sounds.  No rebound.  No guarding.  Central nervous system: Alert and oriented x3. ?  Right facial weakness.  Moving extremities spontaneously.   Extremities: Symmetric 5 x 5 power. Skin: Right buttocks with full-thickness skin loss/right lower extremity with full and partial thickness open areas.  Psychiatry:  Judgement and insight appear normal. Mood & affect appropriate.     Data Reviewed: I have personally reviewed following labs and imaging studies  CBC: Recent Labs  Lab 01/23/21 0255 01/24/21 0121 01/25/21 0249  WBC 9.6 7.7 6.8  NEUTROABS  --   --  5.2  HGB 10.5* 9.9* 9.5*  HCT 33.4* 31.0* 31.3*  MCV 94.1 93.9 96.6  PLT 205 219 035    Basic Metabolic Panel: Recent Labs  Lab 01/23/21 0255 01/23/21 1152 01/23/21 1550 01/24/21 0121 01/25/21 0249  NA 140  --  139 137 140  K 2.8*  --  3.6 3.8 3.5  CL 106  --  108 107 111  CO2 27  --  24 23 21*  GLUCOSE 116*  --  132* 115* 99  BUN 39*  --  35* 35* 33*  CREATININE 1.33*  --  1.46* 1.11 1.22  CALCIUM 8.9  --  8.2* 8.2* 8.2*  MG  --  2.0  --   --  1.8  PHOS  --   --  2.7  --   --     GFR: Estimated Creatinine Clearance: 38.6 mL/min (by C-G formula based on SCr of 1.22 mg/dL).  Liver  Function Tests: Recent Labs  Lab 01/23/21 0255 01/23/21 1550 01/24/21 0121  AST 20  --  27  ALT 13  --  13  ALKPHOS 58  --  50  BILITOT 0.9  --  0.5  PROT 5.9*  --  5.2*  ALBUMIN 2.6* 2.3* 2.1*    CBG: Recent Labs  Lab 01/23/21 0200 01/25/21 0751  GLUCAP 107* 93     Recent Results (from the past 240 hour(s))  C Difficile Quick Screen w PCR reflex     Status: Abnormal   Collection Time: 01/23/21  4:35 AM   Specimen: STOOL  Result Value Ref Range Status   C Diff antigen POSITIVE (A) NEGATIVE Final   C Diff toxin POSITIVE (A) NEGATIVE Final   C Diff interpretation Toxin producing C. difficile detected.  Final    Comment: CRITICAL RESULT CALLED TO, READ BACK BY AND VERIFIED WITH: RN J NASH AT 5974 01/23/21 CRUICKSHANK A Performed at [facility], [place] [address], [place], [place] [address]   Resp Panel by RT-PCR (Flu A&B, Covid) Nasopharyngeal Swab     Status: None   Collection Time: 01/23/21  5:16 AM   Specimen: Nasopharyngeal Swab; Nasopharyngeal(NP) swabs in vial transport medium  Result Value Ref Range Status   SARS Coronavirus 2 by RT PCR NEGATIVE NEGATIVE Final    Comment: (NOTE) SARS-CoV-2 target nucleic acids are NOT DETECTED.  The SARS-CoV-2 RNA is generally detectable in upper respiratory specimens during the acute phase of infection. The lowest concentration of SARS-CoV-2 viral copies this assay can detect is 138 copies/mL. A negative result does not preclude SARS-Cov-2 infection and should not be used as the sole basis for treatment or other patient management decisions. A negative result may occur with  improper specimen collection/handling, submission of specimen other than nasopharyngeal swab, presence of viral mutation(s) within the areas targeted by this assay, and inadequate number of viral copies(<138 copies/mL). A negative result must be combined with clinical observations, patient history, and epidemiological information. The  expected result is Negative.  Fact Sheet for Patients:  EntrepreneurPulse.com.au  Fact Sheet for Healthcare Providers:  IncredibleEmployment.be  This test is no t yet approved or cleared by the [place] FDA and  has been authorized for detection and/or  diagnosis of SARS-CoV-2 by FDA under an Emergency Use Authorization (EUA). This EUA will remain  in effect (meaning this test can be used) for the duration of the COVID-19 declaration under Section 564(b)(1) of the Act, 21 U.S.C.section 360bbb-3(b)(1), unless the authorization is terminated  or revoked sooner.       Influenza A by PCR NEGATIVE NEGATIVE Final   Influenza B by PCR NEGATIVE NEGATIVE Final    Comment: (NOTE) The Xpert Xpress SARS-CoV-2/FLU/RSV plus assay is intended as an aid in the diagnosis of influenza from Nasopharyngeal swab specimens and should not be used as a sole basis for treatment. Nasal washings and aspirates are unacceptable for Xpert Xpress SARS-CoV-2/FLU/RSV testing.  Fact Sheet for Patients: EntrepreneurPulse.com.au  Fact Sheet for Healthcare Providers: IncredibleEmployment.be  This test is not yet approved or cleared by the [place] FDA and has been authorized for detection and/or diagnosis of SARS-CoV-2 by FDA under an Emergency Use Authorization (EUA). This EUA will remain in effect (meaning this test can be used) for the duration of the COVID-19 declaration under Section 564(b)(1) of the Act, 21 U.S.C. section 360bbb-3(b)(1), unless the authorization is terminated or revoked.  Performed at [facility], [place] [address] [place], [place] [address]   MRSA PCR Screening     Status: None   Collection Time: 01/23/21  6:58 AM   Specimen: Nasopharyngeal  Result Value Ref Range Status   MRSA by PCR NEGATIVE NEGATIVE Final    Comment:        The GeneXpert MRSA Assay (FDA approved for NASAL  specimens only), is one component of a comprehensive MRSA colonization surveillance program. It is not intended to diagnose MRSA infection nor to guide or monitor treatment for MRSA infections. Performed at [facility], [place] [address], [place], [place] [address]          Radiology Studies: CT HEAD WO CONTRAST  Result Date: 01/24/2021 CLINICAL DATA:  Follow-up subdural increased lethargy EXAM: CT HEAD WITHOUT CONTRAST TECHNIQUE: Contiguous axial images were obtained from the base of the skull through the vertex without intravenous contrast. COMPARISON:  CT brain 01/23/2021, 09/07/2018 FINDINGS: Brain: No acute territorial infarction or intracranial mass is visualized. Stable to slightly diminished 3 mm left anterior parafalcine subdural hematoma. Atrophy and chronic small vessel ischemic changes of the white matter. Encephalomalacia within the right frontoparietal region. Small chronic infarct in the right cerebellum. Stable ventricle size. Vascular: No hyperdense vessels. Carotid and vertebral artery calcification Skull: Normal. Negative for fracture or focal lesion. Sinuses/Orbits: No acute finding. Other: None IMPRESSION: 1. Stable to slightly diminished 3 mm left anterior parafalcine subdural hematoma. No new hemorrhage visualized. 2. Atrophy and chronic small vessel ischemic changes of the white matter. 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 Electronically Signed   By: Donavan Foil M.D.   On: 01/24/2021 19:23   CT HEAD WO CONTRAST  Result Date: 01/23/2021 CLINICAL DATA:  Follow-up subdural hematoma.  Recent falls. EXAM: CT HEAD WITHOUT CONTRAST TECHNIQUE: Contiguous axial images were obtained from the base of the skull through the vertex without intravenous contrast. COMPARISON:  CT head 01/23/2021, 09/07/2018 FINDINGS: Brain: Small interhemispheric subdural hematoma anteriorly is unchanged from earlier today. This was not present on  the study from 2020 and therefore appears acute. No other intracranial hemorrhage Moderate to advanced atrophy. Chronic ischemic changes are seen throughout the white matter. 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 Small chronic infarct right superior cerebellum. Negative for acute infarct  or mass. Vascular: Negative for hyperdense vessel. Atherosclerotic calcification. Skull: Negative Sinuses/Orbits: Paranasal sinuses clear. Bilateral cataract extraction Other: None IMPRESSION: Small anterior interhemispheric subdural hematoma unchanged from earlier today. Moderate to advanced atrophy.  Chronic ischemic changes. Electronically Signed   By: Franchot Gallo M.D.   On: 01/23/2021 15:00        Scheduled Meds: . amLODipine  2.5 mg Oral Daily  . atorvastatin  20 mg Oral QODAY  . Chlorhexidine Gluconate Cloth  6 each Topical Q0600  . escitalopram  10 mg Oral Daily  . ferrous sulfate  325 mg Oral Daily  . finasteride  5 mg Oral Once per day on Sun Mon Tue Thu Fri Sat  . hydrocerin   Topical Daily  . latanoprost  1 drop Both Eyes QHS  . mouth rinse  15 mL Mouth Rinse BID  . metoprolol succinate  50 mg Oral Q breakfast  . multivitamin with minerals  1 tablet Oral Daily  . vancomycin  125 mg Oral QID   Continuous Infusions:   LOS: 2 days    Time spent: 40 minutes    Irine Seal, MD [facility]   To contact the attending provider between 7A-7P or the covering provider during after hours 7P-7A, please log into the web site [email or web address] and access using universal [place] password for that web site. If you do not have the password, please call the hospital operator.  01/25/2021, 9:58 AM

## 2021-01-26 ENCOUNTER — Inpatient Hospital Stay (HOSPITAL_COMMUNITY): Payer: Medicare Other

## 2021-01-26 ENCOUNTER — Encounter (HOSPITAL_COMMUNITY): Payer: Self-pay | Admitting: Internal Medicine

## 2021-01-26 DIAGNOSIS — S065X9A Traumatic subdural hemorrhage with loss of consciousness of unspecified duration, initial encounter: Secondary | ICD-10-CM | POA: Diagnosis not present

## 2021-01-26 DIAGNOSIS — M7989 Other specified soft tissue disorders: Secondary | ICD-10-CM

## 2021-01-26 DIAGNOSIS — Z7189 Other specified counseling: Secondary | ICD-10-CM

## 2021-01-26 DIAGNOSIS — Z515 Encounter for palliative care: Secondary | ICD-10-CM

## 2021-01-26 DIAGNOSIS — N400 Enlarged prostate without lower urinary tract symptoms: Secondary | ICD-10-CM | POA: Diagnosis not present

## 2021-01-26 DIAGNOSIS — D649 Anemia, unspecified: Secondary | ICD-10-CM | POA: Diagnosis not present

## 2021-01-26 DIAGNOSIS — R531 Weakness: Secondary | ICD-10-CM

## 2021-01-26 DIAGNOSIS — E8809 Other disorders of plasma-protein metabolism, not elsewhere classified: Secondary | ICD-10-CM

## 2021-01-26 DIAGNOSIS — A0472 Enterocolitis due to Clostridium difficile, not specified as recurrent: Secondary | ICD-10-CM | POA: Diagnosis not present

## 2021-01-26 LAB — CBC WITH DIFFERENTIAL/PLATELET
Abs Immature Granulocytes: 0.02 10*3/uL (ref 0.00–0.07)
Basophils Absolute: 0 10*3/uL (ref 0.0–0.1)
Basophils Relative: 1 %
Eosinophils Absolute: 0.1 10*3/uL (ref 0.0–0.5)
Eosinophils Relative: 1 %
HCT: 28.9 % — ABNORMAL LOW (ref 39.0–52.0)
Hemoglobin: 9.1 g/dL — ABNORMAL LOW (ref 13.0–17.0)
Immature Granulocytes: 0 %
Lymphocytes Relative: 14 %
Lymphs Abs: 0.9 10*3/uL (ref 0.7–4.0)
MCH: 29.9 pg (ref 26.0–34.0)
MCHC: 31.5 g/dL (ref 30.0–36.0)
MCV: 95.1 fL (ref 80.0–100.0)
Monocytes Absolute: 0.3 10*3/uL (ref 0.1–1.0)
Monocytes Relative: 5 %
Neutro Abs: 4.9 10*3/uL (ref 1.7–7.7)
Neutrophils Relative %: 79 %
Platelets: 179 10*3/uL (ref 150–400)
RBC: 3.04 MIL/uL — ABNORMAL LOW (ref 4.22–5.81)
RDW: 17.7 % — ABNORMAL HIGH (ref 11.5–15.5)
WBC: 6.3 10*3/uL (ref 4.0–10.5)
nRBC: 0 % (ref 0.0–0.2)

## 2021-01-26 LAB — RENAL FUNCTION PANEL
Albumin: 1.9 g/dL — ABNORMAL LOW (ref 3.5–5.0)
Anion gap: 6 (ref 5–15)
BUN: 35 mg/dL — ABNORMAL HIGH (ref 8–23)
CO2: 23 mmol/L (ref 22–32)
Calcium: 8.1 mg/dL — ABNORMAL LOW (ref 8.9–10.3)
Chloride: 109 mmol/L (ref 98–111)
Creatinine, Ser: 1.3 mg/dL — ABNORMAL HIGH (ref 0.61–1.24)
GFR, Estimated: 51 mL/min — ABNORMAL LOW (ref >60)
Glucose, Bld: 124 mg/dL — ABNORMAL HIGH (ref 70–99)
Phosphorus: 2.4 mg/dL — ABNORMAL LOW (ref 2.5–4.6)
Potassium: 3.6 mmol/L (ref 3.5–5.1)
Sodium: 138 mmol/L (ref 135–145)

## 2021-01-26 LAB — ALBUMIN: Albumin: 2.4 g/dL — ABNORMAL LOW (ref 3.5–5.0)

## 2021-01-26 LAB — PROTEIN, TOTAL: Total Protein: 4.5 g/dL — ABNORMAL LOW (ref 6.5–8.1)

## 2021-01-26 LAB — LACTATE DEHYDROGENASE, PLEURAL OR PERITONEAL FLUID: LD, Fluid: 59 U/L — ABNORMAL HIGH (ref 3–23)

## 2021-01-26 LAB — GLUCOSE, PLEURAL OR PERITONEAL FLUID: Glucose, Fluid: 131 mg/dL

## 2021-01-26 LAB — LACTATE DEHYDROGENASE: LDH: 151 U/L (ref 98–192)

## 2021-01-26 LAB — PROTEIN, PLEURAL OR PERITONEAL FLUID: Total protein, fluid: <3 g/dL

## 2021-01-26 LAB — MAGNESIUM: Magnesium: 2.3 mg/dL (ref 1.7–2.4)

## 2021-01-26 MED ORDER — K PHOS MONO-SOD PHOS DI & MONO 155-852-130 MG PO TABS
250.0000 mg | ORAL_TABLET | Freq: Two times a day (BID) | ORAL | Status: AC
  Administered 2021-01-26 – 2021-01-28 (×6): 250 mg via ORAL
  Filled 2021-01-26 (×6): qty 1

## 2021-01-26 MED ORDER — ALBUMIN HUMAN 25 % IV SOLN
25.0000 g | Freq: Four times a day (QID) | INTRAVENOUS | Status: AC
  Administered 2021-01-26 – 2021-01-27 (×4): 25 g via INTRAVENOUS
  Filled 2021-01-26 (×4): qty 100

## 2021-01-26 MED ORDER — FUROSEMIDE 10 MG/ML IJ SOLN
20.0000 mg | Freq: Once | INTRAMUSCULAR | Status: AC
  Administered 2021-01-26: 20 mg via INTRAVENOUS
  Filled 2021-01-26: qty 2

## 2021-01-26 MED ORDER — LIDOCAINE 5 % EX PTCH
1.0000 | MEDICATED_PATCH | CUTANEOUS | Status: DC
  Administered 2021-01-26 – 2021-01-29 (×4): 1 via TRANSDERMAL
  Filled 2021-01-26 (×4): qty 1

## 2021-01-26 NOTE — Progress Notes (Signed)
PROGRESS NOTE    Dalton Townsend  ZES:[id number] DOB: 1922/12/17 DOA: 01/22/2021 PCP: Lajean Manes, MD    Chief Complaint  Patient presents with  . Weakness    Brief Narrative:  Patient 87 year old gentleman history of A. fib on chronic anticoagulation, complete heart block status post PPM, hypertension, UC presented to the ED with falls.  Patient noted to have a subdural hematoma with repeat head CT with no significant change.  Films assessed by neurosurgery who feel no further surgical intervention needed at this time.  Patient also noted to have acute on chronic diarrhea which has been worsening, C. difficile PCR was positive.  Patient placed on oral vancomycin.   Assessment & Plan:   Principal Problem:   SDH (subdural hematoma) (HCC) Active Problems:   C. difficile diarrhea   HTN (hypertension)   Severe aortic stenosis   Presence of permanent cardiac pacemaker   PAF (paroxysmal atrial fibrillation) (HCC)   CAD (coronary artery disease)   BPH (benign prostatic hyperplasia)   S/P TAVR (transcatheter aortic valve replacement)   Anemia   Bilateral pleural effusion   1  Subdural hematoma -Secondary to mechanical fall in the setting of anticoagulation. -EDP spoke with neurosurgery who reviewed CT head films x2 and felt repeat head CT was grossly stable, no need for neurosurgical intervention, reevaluation of need for anticoagulation and recommended at least holding aspirin and Eliquis for 2 weeks and okay to resume if needed. -Continue to hold anticoagulation of Eliquis and aspirin. -Patient noted to be more drowsy and lethargic the evening of 01/24/2021, and noted to have some confusion and as such repeat head CT done last night (5/28) with stable to slightly diminished 3 mm left anterior parafalcine subdural hematoma.  No new hemorrhage visualized.  Atrophic and chronic small vessel ischemic changes of white matter.  Chronic infarcts in the right frontotemporal region and  right cerebellum.  -Continue supportive care.   -PT/OT.   2.  C. difficile diarrhea -Patient noted to have presented with acute on chronic worsening diarrhea. -Patient with history of UC. -Patient with no recent exposure to antibiotics or C. difficile positive patients. -Slowly clinically improving.   -Frequency of stools decreasing, consistency of stools improving.   -Continue oral vancomycin.   -Supportive care.   3.  Bilateral pleural effusions R > L -Noted on CT chest and CT abdomen and pelvis. -Patient slightly visibly short of breath.  -Pulmonary/PCCM consulted who reviewed films and assessed patient and based on patient's daughter wishes to hold off on thoracentesis at this time until further discussions have been had by palliative care team.   -Patient started on Lasix yesterday however patient more on the dry side and as such Lasix discontinued.  -Patient with albumin of 1.9.  -Patient with sats in the high 80s when speaking.  -IV albumin every 6 hours x1 day, Lasix 20 mg IV x1.  -Strict I's and O's.  -Daily weights. -Pulmonary/PCCM following and appreciate input and recommendations.   4.  Hypokalemia -Likely secondary to GI losses. -Potassium at 3.6 -Magnesium at 2.3. -Repeat labs in the morning.  5.  Anemia -Likely secondary to problem #1 and also chronic. -Patient with no overt bleeding. -Hemoglobin stable at 9.1.  -Transfusion threshold hemoglobin < 7.  6.  Hypertension -Blood pressure noted to be soft this morning.   -Continue Proscar, Toprol-XL.   -Discontinue Norvasc.  ARB on hold.  Lasix discontinued.   -Follow.  7.  History of complete heart block status post PPM/paroxysmal  A. Fib/severe aortic stenosis status post TAVR/CAD/chronic CHF -Rate controlled on Toprol-XL.  -Hold ARB secondary to soft blood pressure. -Hold Norvasc secondary to borderline blood pressure. -Eliquis on hold secondary to problem #1. -Patient more on the dry side today, and as such  home dose oral Lasix was discontinued.  8.  BPH -Continue Proscar  9.  History of ulcerative colitis -Continue to hold home regimen mesalamine, balsalazide while treating acute C. difficile diarrhea.   -Outpatient follow-up with GI.  9.  Prognosis -Patient 87 year old gentleman with history of complete heart block status post PPM, paroxysmal atrial fibrillation, UC, presenting now with falls leading to a subdural hematoma and noted to be positive for C. difficile diarrhea.  Anticoagulation on hold. -Patient also noted to have some pleural effusions. -Patient with multiple comorbidities, advanced age. -Palliative care consulted and following. -Patient likely will need palliative care to follow in the outpatient setting.  10.  Hypoalbuminemia -IV albumin every 6 hours x1 day. -Diet liberalized to regular diet per dietitian.    DVT prophylaxis: SCD Code Status: DNR Family Communication: Updated patient and daughter, [name] at bedside Disposition:   Status is: Inpatient    Dispo: The patient is from: Home              Anticipated d/c is to: SNF with palliative care following versus CIR              Patient currently with subdural hematoma, C. difficile diarrhea, bilateral pleural effusions R > L, not stable for discharge   Difficult to place patient unknown       Consultants:   PCCM: Dr. Erin Fulling 01/24/2021  Neurosurgery: Dr.Dawley-reviewed CT findings 01/23/2021  Palliative care: Dr. Rowe Pavy 01/25/2021  Procedures:   CT head x2 01/23/2021  CT head 01/24/2021  CT chest 01/23/2021  CT L-spine 01/23/2021  CT abdomen and pelvis 01/23/2021  CT C-spine 01/23/2021  Chest x-ray 01/23/2021, 01/26/2021  Antimicrobials:   Oral vancomycin 01/23/2021>>>> 02/02/2021   Subjective: Patient sitting up in bed.  Mentation seems somewhat slow this morning however alert and oriented to self place and time.  Denies any chest pain.  Denies any significant shortness of breath.  Patient  slightly visibly short of breath.  Tolerating current diet.  States frequency of stools decreasing and consistency is improving.   Family requesting evaluation by CIR to see if patient is a candidate.  Objective: Vitals:   01/26/21 0600 01/26/21 0700 01/26/21 0754 01/26/21 0800  BP: 98/71 107/80 (!) 127/50 (!) 136/50  Pulse: (!) 59 (!) 59  62  Resp: 19 (!) 21  18  Temp:    98.8 F (37.1 C)  TempSrc:    Oral  SpO2: 96% 96%  95%  Weight:      Height:        Intake/Output Summary (Last 24 hours) at 01/26/2021 0910 Last data filed at 01/26/2021 0400 Gross per 24 hour  Intake 381.05 ml  Output 400 ml  Net -18.95 ml   Filed Weights   01/22/21 2343 01/23/21 0700  Weight: 81 kg 77 kg    Examination:  General exam: NAD. Respiratory system: Decreased breath sounds in the bases.  No wheezing.  No crackles.  Slightly visibly short of breath.  Fair air movement.  Speaking in full sentences.  Cardiovascular system: RRR no murmurs rubs or gallops.  No JVD.  No lower extremity edema.  Gastrointestinal system: Abdomen is soft, nontender, nondistended, positive bowel sounds.  No rebound.  No guarding.  Central  nervous system: Alert and oriented x3. Moving extremities spontaneously.   Extremities: Symmetric 5 x 5 power. Skin: Right buttocks with full-thickness skin loss/right lower extremity with full and partial thickness open areas.  Psychiatry: Judgement and insight appear normal. Mood & affect appropriate.     Data Reviewed: I have personally reviewed following labs and imaging studies  CBC: Recent Labs  Lab 01/23/21 0255 01/24/21 0121 01/25/21 0249 01/26/21 0250  WBC 9.6 7.7 6.8 6.3  NEUTROABS  --   --  5.2 4.9  HGB 10.5* 9.9* 9.5* 9.1*  HCT 33.4* 31.0* 31.3* 28.9*  MCV 94.1 93.9 96.6 95.1  PLT 205 219 217 476    Basic Metabolic Panel: Recent Labs  Lab 01/23/21 0255 01/23/21 1152 01/23/21 1550 01/24/21 0121 01/25/21 0249 01/26/21 0250  NA 140  --  139 137 140 138   K 2.8*  --  3.6 3.8 3.5 3.6  CL 106  --  108 107 111 109  CO2 27  --  24 23 21* 23  GLUCOSE 116*  --  132* 115* 99 124*  BUN 39*  --  35* 35* 33* 35*  CREATININE 1.33*  --  1.46* 1.11 1.22 1.30*  CALCIUM 8.9  --  8.2* 8.2* 8.2* 8.1*  MG  --  2.0  --   --  1.8 2.3  PHOS  --   --  2.7  --   --  2.4*    GFR: Estimated Creatinine Clearance: 36.2 mL/min (A) (by C-G formula based on SCr of 1.3 mg/dL (H)).  Liver Function Tests: Recent Labs  Lab 01/23/21 0255 01/23/21 1550 01/24/21 0121 01/26/21 0250  AST 20  --  27  --   ALT 13  --  13  --   ALKPHOS 58  --  50  --   BILITOT 0.9  --  0.5  --   PROT 5.9*  --  5.2*  --   ALBUMIN 2.6* 2.3* 2.1* 1.9*    CBG: Recent Labs  Lab 01/23/21 0200 01/25/21 0751  GLUCAP 107* 93     Recent Results (from the past 240 hour(s))  C Difficile Quick Screen w PCR reflex     Status: Abnormal   Collection Time: 01/23/21  4:35 AM   Specimen: STOOL  Result Value Ref Range Status   C Diff antigen POSITIVE (A) NEGATIVE Final   C Diff toxin POSITIVE (A) NEGATIVE Final   C Diff interpretation Toxin producing C. difficile detected.  Final    Comment: CRITICAL RESULT CALLED TO, READ BACK BY AND VERIFIED WITH: RN J NASH AT 5465 01/23/21 CRUICKSHANK A Performed at [facility], [place] [address], [place], [place] [address]   Resp Panel by RT-PCR (Flu A&B, Covid) Nasopharyngeal Swab     Status: None   Collection Time: 01/23/21  5:16 AM   Specimen: Nasopharyngeal Swab; Nasopharyngeal(NP) swabs in vial transport medium  Result Value Ref Range Status   SARS Coronavirus 2 by RT PCR NEGATIVE NEGATIVE Final    Comment: (NOTE) SARS-CoV-2 target nucleic acids are NOT DETECTED.  The SARS-CoV-2 RNA is generally detectable in upper respiratory specimens during the acute phase of infection. The lowest concentration of SARS-CoV-2 viral copies this assay can detect is 138 copies/mL. A negative result does not preclude SARS-Cov-2 infection and  should not be used as the sole basis for treatment or other patient management decisions. A negative result may occur with  improper specimen collection/handling, submission of specimen other than nasopharyngeal swab,  presence of viral mutation(s) within the areas targeted by this assay, and inadequate number of viral copies(<138 copies/mL). A negative result must be combined with clinical observations, patient history, and epidemiological information. The expected result is Negative.  Fact Sheet for Patients:  EntrepreneurPulse.com.au  Fact Sheet for Healthcare Providers:  IncredibleEmployment.be  This test is no t yet approved or cleared by the [place] FDA and  has been authorized for detection and/or diagnosis of SARS-CoV-2 by FDA under an Emergency Use Authorization (EUA). This EUA will remain  in effect (meaning this test can be used) for the duration of the COVID-19 declaration under Section 564(b)(1) of the Act, 21 U.S.C.section 360bbb-3(b)(1), unless the authorization is terminated  or revoked sooner.       Influenza A by PCR NEGATIVE NEGATIVE Final   Influenza B by PCR NEGATIVE NEGATIVE Final    Comment: (NOTE) The Xpert Xpress SARS-CoV-2/FLU/RSV plus assay is intended as an aid in the diagnosis of influenza from Nasopharyngeal swab specimens and should not be used as a sole basis for treatment. Nasal washings and aspirates are unacceptable for Xpert Xpress SARS-CoV-2/FLU/RSV testing.  Fact Sheet for Patients: EntrepreneurPulse.com.au  Fact Sheet for Healthcare Providers: IncredibleEmployment.be  This test is not yet approved or cleared by the [place] FDA and has been authorized for detection and/or diagnosis of SARS-CoV-2 by FDA under an Emergency Use Authorization (EUA). This EUA will remain in effect (meaning this test can be used) for the duration of the COVID-19 declaration  under Section 564(b)(1) of the Act, 21 U.S.C. section 360bbb-3(b)(1), unless the authorization is terminated or revoked.  Performed at [facility], [place] [address], [place], [place] [address]   MRSA PCR Screening     Status: None   Collection Time: 01/23/21  6:58 AM   Specimen: Nasopharyngeal  Result Value Ref Range Status   MRSA by PCR NEGATIVE NEGATIVE Final    Comment:        The GeneXpert MRSA Assay (FDA approved for NASAL specimens only), is one component of a comprehensive MRSA colonization surveillance program. It is not intended to diagnose MRSA infection nor to guide or monitor treatment for MRSA infections. Performed at [facility], [place] [address] [place],  [address]          Radiology Studies: CT HEAD WO CONTRAST  Result Date: 01/24/2021 CLINICAL DATA:  Follow-up subdural increased lethargy EXAM: CT HEAD WITHOUT CONTRAST TECHNIQUE: Contiguous axial images were obtained from the base of the skull through the vertex without intravenous contrast. COMPARISON:  CT brain 01/23/2021, 09/07/2018 FINDINGS: Brain: No acute territorial infarction or intracranial mass is visualized. Stable to slightly diminished 3 mm left anterior parafalcine subdural hematoma. Atrophy and chronic small vessel ischemic changes of the white matter. Encephalomalacia within the right frontoparietal region. Small chronic infarct in the right cerebellum. Stable ventricle size. Vascular: No hyperdense vessels. Carotid and vertebral artery calcification Skull: Normal. Negative for fracture or focal lesion. Sinuses/Orbits: No acute finding. Other: None IMPRESSION: 1. Stable to slightly diminished 3 mm left anterior parafalcine subdural hematoma. No new hemorrhage visualized. 2. Atrophy and chronic small vessel ischemic changes of the white matter. 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 Electronically Signed   By: Donavan Foil  M.D.   On: 01/24/2021 19:23   DG CHEST PORT 1 VIEW  Result Date: 01/26/2021 CLINICAL DATA:  87 year old male with history of pleural effusion. EXAM: PORTABLE CHEST 1 VIEW COMPARISON:  Chest x-ray 01/23/2021. FINDINGS:  Moderate right pleural effusion. No definite acute consolidative airspace disease. No left pleural effusion. No pneumothorax. Cephalization of the pulmonary vasculature, without frank pulmonary edema. Heart size is borderline enlarged. Upper mediastinal contours are within normal limits allowing for patient positioning. Atherosclerosis in the thoracic aorta. Status post median sternotomy for CABG. Status post TAVR. Left-sided pacemaker device in place with lead tips projecting over the expected location of the right atrium and right ventricle. IMPRESSION: 1. Moderate right pleural effusion. 2. Borderline cardiomegaly with pulmonary venous congestion, but no frank pulmonary edema. 3. Aortic atherosclerosis. Electronically Signed   By: Vinnie Langton M.D.   On: 01/26/2021 08:11        Scheduled Meds: . atorvastatin  20 mg Oral QODAY  . Chlorhexidine Gluconate Cloth  6 each Topical Q0600  . escitalopram  10 mg Oral Daily  . feeding supplement  237 mL Oral BID BM  . ferrous sulfate  325 mg Oral Daily  . finasteride  5 mg Oral Once per day on Sun Mon Tue Thu Fri Sat  . hydrocerin   Topical Daily  . latanoprost  1 drop Both Eyes QHS  . lidocaine  1 patch Transdermal Q24H  . mouth rinse  15 mL Mouth Rinse BID  . metoprolol succinate  50 mg Oral Q breakfast  . multivitamin with minerals  1 tablet Oral Daily  . phosphorus  250 mg Oral BID  . sodium chloride flush  10 mL Intravenous Q12H  . vancomycin  125 mg Oral QID   Continuous Infusions: . albumin human 25 g (01/26/21 0844)     LOS: 3 days    Time spent: 35 minutes    Irine Seal, MD [facility]   To contact the attending provider between 7A-7P or the covering provider during after hours 7P-7A, please  log into the web site [email or web address] and access using universal [place] password for that web site. If you do not have the password, please call the hospital operator.  01/26/2021, 9:10 AM

## 2021-01-26 NOTE — Progress Notes (Signed)
Daily Progress Note   Patient Name: Dalton Townsend       Date: 01/26/2021 DOB: 13-Nov-1922  Age: 87 y.o. MRN#: [id number] Attending Physician: Eugenie Filler, MD Primary Care Physician: Lajean Manes, MD Admit Date: 01/22/2021  Reason for Consultation/Follow-up: Establishing goals of care  Subjective: Patient seen briefly, nursing staff at bedside, patient undergoing patient care.  Call placed and discussed with daughter [name].  Chart reviewed.  Patient has been peripherally evaluated by inpatient rehabilitation out at: With recommendations to consider skilled nursing facility rehab attempt.  Oxygen saturations have been as low as 88% in the past 24 hours, has required supplemental oxygen off and on.  Likely did not rest well overnight.  Chest x-ray from this morning reviewed.  Discussed with PCCM colleague Dr. Erin Fulling.  Length of Stay: 3  Current Medications: Scheduled Meds:  . atorvastatin  20 mg Oral QODAY  . Chlorhexidine Gluconate Cloth  6 each Topical Q0600  . escitalopram  10 mg Oral Daily  . feeding supplement  237 mL Oral BID BM  . ferrous sulfate  325 mg Oral Daily  . finasteride  5 mg Oral Once per day on Sun Mon Tue Thu Fri Sat  . hydrocerin   Topical Daily  . latanoprost  1 drop Both Eyes QHS  . lidocaine  1 patch Transdermal Q24H  . mouth rinse  15 mL Mouth Rinse BID  . metoprolol succinate  50 mg Oral Q breakfast  . multivitamin with minerals  1 tablet Oral Daily  . phosphorus  250 mg Oral BID  . sodium chloride flush  10 mL Intravenous Q12H  . vancomycin  125 mg Oral QID    Continuous Infusions: . albumin human 25 g (01/26/21 0844)    PRN Meds: acetaminophen **OR** acetaminophen, hydrALAZINE  Physical Exam         Elderly gentleman resting in  bed Appears with generalized weakness At times does not appear dyspneic Of the extremity spontaneously Telemetry monitor noted.  Vital Signs: BP (!) 136/50 (BP Location: Left Arm)   Pulse 62   Temp 98.8 F (37.1 C) (Oral)   Resp 18   Ht 5' 11"  (1.803 m)   Wt 77 kg   SpO2 95%   BMI 23.68 kg/m  SpO2: SpO2: 95 % O2 Device: O2 Device: Nasal  Cannula O2 Flow Rate: O2 Flow Rate (L/min): 1 L/min  Intake/output summary:   Intake/Output Summary (Last 24 hours) at 01/26/2021 1142 Last data filed at 01/26/2021 0400 Gross per 24 hour  Intake 381.05 ml  Output 400 ml  Net -18.95 ml   LBM: Last BM Date: 01/25/21 Baseline Weight: Weight: 81 kg Most recent weight: Weight: 77 kg       Palliative Assessment/Data: PPS 50%     Patient Active Problem List   Diagnosis Date Noted  . Anemia 01/24/2021  . Bilateral pleural effusion   . SDH (subdural hematoma) ([place]) 01/23/2021  . 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 S/P femoral-femoral bypass surgery   . Aortic stenosis 04/11/2018  . Presence of permanent cardiac pacemaker   . PAF (paroxysmal atrial fibrillation) ([place])   . Carotid artery disease ([place])   . CAD (coronary artery disease)   . BPH (benign prostatic hyperplasia)   . GAD (generalized anxiety disorder)   . Acute on chronic congestive heart failure ([place]) 04/02/2018  . Severe aortic stenosis 01/04/2014  . C. difficile diarrhea 06/24/2010  . HTN (hypertension) 06/24/2010  . Ulcerative colitis ([name]) 06/24/2010    Palliative Care Assessment & Plan   Patient Profile:  Patient 87 year old gentleman history of A. fib on chronic anticoagulation, complete heart block status post PPM, hypertension, UC presented to the ED with falls.  Assessment:  Subdura hematoma C Diff Bilateral Pleural Effusions R greater than L Functional decline and recent falls Palliative consult for goals of care.   Recommendations/Plan:  Patient/family considering  proceeding with thoracentesis at this time for right-sided pleural effusion.  Palliative care to follow.  SNF rehab with palliative is being considered.    Code Status:    Code Status Orders  (From admission, onward)         Start     Ordered   01/23/21 0834  Do not attempt resuscitation (DNR)  Continuous       Question Answer Comment  In the event of cardiac or respiratory ARREST Do not call a "code blue"   In the event of cardiac or respiratory ARREST Do not perform Intubation, CPR, defibrillation or ACLS   In the event of cardiac or respiratory ARREST Use medication by any route, position, wound care, and other measures to relive pain and suffering. May use oxygen, suction and manual treatment of airway obstruction as needed for comfort.   Comments DNR confirmed with pt, dtr      01/23/21 0837        Code Status History    Date Active Date Inactive Code Status Order ID Comments User Context   04/11/2018 1529 04/22/2018 2030 Full Code [id number]  Rexene Alberts, MD Inpatient   04/02/2018 2359 04/07/2018 2040 Full Code [id number]  Vilma Prader, MD ED   Advance Care Planning Activity    Advance Directive Documentation   Flowsheet Row Most Recent Value  Type of Advance Directive Healthcare Power of Attorney, Living will  Pre-existing out of facility DNR order (yellow form or pink MOST form) --  "MOST" Form in Place? --       Prognosis:   Unable to determine  Discharge Planning:  [place] for rehab with Palliative care service follow-up  Care plan was discussed with daughter [name] as well as with PCCM Colleague Dr Erin Fulling.    Thank you for allowing the Palliative Medicine Team to assist in the care of this patient.   Time In:  11 Time Out: 11.35 Total Time 35 Prolonged Time Billed  no       Greater than 50%  of this time was spent counseling and coordinating care related to the above assessment and plan.  Loistine Chance, MD  Please contact Palliative  Medicine Team phone at [phone] for questions and concerns.

## 2021-01-26 NOTE — Progress Notes (Addendum)
Inpatient Rehabilitation Admissions Coordinator  Inpatient rehab consult received. I contacted patient's daughter, [name], by phone who is at patient's bedside. I discussed that therapy, PT and OT, are currently recommending SNF level rehab, not CIR/inpatient rehab. At this time I concur with therapy recommendation of SNF to give patient a slower paced longer rehab recovery before returning home.   Danne Baxter, RN, MSN Rehab Admissions Coordinator [phone] 01/26/2021 10:26 AM

## 2021-01-26 NOTE — Procedures (Signed)
Thoracentesis  Procedure Note  Dalton Townsend  [id number]  1923/02/16  Date:01/26/21  Time:4:28 PM   Provider Performing:Ericberto Padget B Adain Geurin   Procedure: Thoracentesis with imaging guidance (72072)  Indication(s) Pleural Effusion  Consent Risks of the procedure as well as the alternatives and risks of each were explained to the patient and/or caregiver.  Consent for the procedure was obtained and is signed in the bedside chart  Anesthesia Topical only with 1% lidocaine    Time Out Verified patient identification, verified procedure, site/side was marked, verified correct patient position, special equipment/implants available, medications/allergies/relevant history reviewed, required imaging and test results available.   Sterile Technique Maximal sterile technique including full sterile barrier drape, hand hygiene, sterile gown, sterile gloves, mask, hair covering, sterile ultrasound probe cover (if used).  Procedure Description Ultrasound was used to identify appropriate pleural anatomy for placement and overlying skin marked.  Area of drainage cleaned and draped in sterile fashion. Lidocaine was used to anesthetize the skin and subcutaneous tissue.  1400 cc's of clear, straw colored appearing fluid was drained from the right pleural space. Catheter then removed and bandaid applied to site.   Complications/Tolerance None; patient tolerated the procedure well. Chest X-ray is ordered to confirm no post-procedural complication.   EBL Minimal   Specimen(s) Pleural fluid

## 2021-01-26 NOTE — Progress Notes (Signed)
LUE venous duplex has been completed.  Results can be found under chart review under CV PROC. 01/26/2021 1:31 PM Kamea Dacosta RVT, RDMS

## 2021-01-27 ENCOUNTER — Inpatient Hospital Stay (HOSPITAL_COMMUNITY): Payer: Medicare Other

## 2021-01-27 DIAGNOSIS — S065X9A Traumatic subdural hemorrhage with loss of consciousness of unspecified duration, initial encounter: Secondary | ICD-10-CM | POA: Diagnosis not present

## 2021-01-27 DIAGNOSIS — N400 Enlarged prostate without lower urinary tract symptoms: Secondary | ICD-10-CM | POA: Diagnosis not present

## 2021-01-27 DIAGNOSIS — Z7189 Other specified counseling: Secondary | ICD-10-CM | POA: Diagnosis not present

## 2021-01-27 DIAGNOSIS — A0472 Enterocolitis due to Clostridium difficile, not specified as recurrent: Secondary | ICD-10-CM | POA: Diagnosis not present

## 2021-01-27 DIAGNOSIS — R531 Weakness: Secondary | ICD-10-CM | POA: Diagnosis not present

## 2021-01-27 DIAGNOSIS — D649 Anemia, unspecified: Secondary | ICD-10-CM | POA: Diagnosis not present

## 2021-01-27 LAB — BODY FLUID CELL COUNT WITH DIFFERENTIAL
Eos, Fluid: 0 %
Lymphs, Fluid: 72 %
Monocyte-Macrophage-Serous Fluid: 20 % — ABNORMAL LOW (ref 50–90)
Neutrophil Count, Fluid: 8 % (ref 0–25)
Total Nucleated Cell Count, Fluid: 99 cu mm (ref 0–1000)

## 2021-01-27 LAB — BASIC METABOLIC PANEL
Anion gap: 6 (ref 5–15)
BUN: 35 mg/dL — ABNORMAL HIGH (ref 8–23)
CO2: 22 mmol/L (ref 22–32)
Calcium: 8.2 mg/dL — ABNORMAL LOW (ref 8.9–10.3)
Chloride: 110 mmol/L (ref 98–111)
Creatinine, Ser: 1.41 mg/dL — ABNORMAL HIGH (ref 0.61–1.24)
GFR, Estimated: 46 mL/min — ABNORMAL LOW (ref >60)
Glucose, Bld: 114 mg/dL — ABNORMAL HIGH (ref 70–99)
Potassium: 3.5 mmol/L (ref 3.5–5.1)
Sodium: 138 mmol/L (ref 135–145)

## 2021-01-27 LAB — CBC WITH DIFFERENTIAL/PLATELET
Abs Immature Granulocytes: 0.03 10*3/uL (ref 0.00–0.07)
Basophils Absolute: 0 10*3/uL (ref 0.0–0.1)
Basophils Relative: 1 %
Eosinophils Absolute: 0.1 10*3/uL (ref 0.0–0.5)
Eosinophils Relative: 1 %
HCT: 24.4 % — ABNORMAL LOW (ref 39.0–52.0)
Hemoglobin: 7.6 g/dL — ABNORMAL LOW (ref 13.0–17.0)
Immature Granulocytes: 1 %
Lymphocytes Relative: 20 %
Lymphs Abs: 0.8 10*3/uL (ref 0.7–4.0)
MCH: 30.5 pg (ref 26.0–34.0)
MCHC: 31.1 g/dL (ref 30.0–36.0)
MCV: 98 fL (ref 80.0–100.0)
Monocytes Absolute: 0.3 10*3/uL (ref 0.1–1.0)
Monocytes Relative: 7 %
Neutro Abs: 2.7 10*3/uL (ref 1.7–7.7)
Neutrophils Relative %: 70 %
Platelets: 152 10*3/uL (ref 150–400)
RBC: 2.49 MIL/uL — ABNORMAL LOW (ref 4.22–5.81)
RDW: 17.7 % — ABNORMAL HIGH (ref 11.5–15.5)
WBC: 3.9 10*3/uL — ABNORMAL LOW (ref 4.0–10.5)
nRBC: 0 % (ref 0.0–0.2)

## 2021-01-27 LAB — MAGNESIUM: Magnesium: 2.3 mg/dL (ref 1.7–2.4)

## 2021-01-27 LAB — PH, BODY FLUID: pH, Body Fluid: 7.6

## 2021-01-27 LAB — ALBUMIN, PLEURAL OR PERITONEAL FLUID: Albumin, Fluid: 0.8 g/dL

## 2021-01-27 LAB — AMYLASE, PLEURAL OR PERITONEAL FLUID: Amylase, Fluid: 10 U/L

## 2021-01-27 MED ORDER — OXYCODONE HCL 5 MG PO TABS: 5.0000 mg | ORAL_TABLET | Freq: Four times a day (QID) | ORAL | Status: DC | PRN

## 2021-01-27 NOTE — TOC Progression Note (Addendum)
Transition of Care [facility]) - Progression Note    Patient Details  Name: Dalton Townsend MRN: [id number] Date of Birth: 07-14-1923  Transition of Care [facility]) CM/SW Contact  Leeroy Cha, RN Phone Number: 01/27/2021, 7:41 AM  Clinical Narrative:    Patient 87 year old gentleman history of A. fib on chronic anticoagulation, complete heart block status post PPM, hypertension, UC presented to the ED with falls.  Assessment:  Subdura hematoma C Diff Bilateral Pleural Effusions R greater than L Functional decline and recent falls Palliative consult for goals of care.   Recommendations/Plan:  Patient/family considering proceeding with thoracentesis at this time for right-sided pleural effusion.  Palliative care to follow.  SNF rehab with palliative is being considered.  PLan snf placement.  [place] [phone] no answer. [place] [facility] is the closest to the home address.will discuss choices with granddaughter and call back.  Did inform Daughter that he is to be moved to 1603 this am. [id number]/TCF-GRANDDAUGHTER will look over the snf's that have made bed offers and call me back. Expected Discharge Plan: [place] Barriers to Discharge: Continued Medical Work up  Expected Discharge Plan and Services Expected Discharge Plan: [place] In-house Referral: Clinical Social Work Discharge Planning Services: CM Consult Post Acute Care Choice: [place] arrangements for the past 2 months: Single Family Home                                       Social Determinants of Health (SDOH) Interventions    Readmission Risk Interventions No flowsheet data found.

## 2021-01-27 NOTE — Progress Notes (Signed)
PROGRESS NOTE    Dalton Townsend  CBS:[id number] DOB: 03-24-1923 DOA: 01/22/2021 PCP: Lajean Manes, MD    Brief Narrative:  Mr. Liwanag was admitted to the hospital with the working diagnosis of traumatic subacute 3 mm left parafalcine subdural hematoma, in the setting of C. difficile diarrhea.   87 year old male past medical history for atrial fibrillation, complete heart block status post pacemaker, hypertension and ulcerative colitis who presented after mechanical fall.  Patient reported multiple falls over several days before hospitalization, PT before he was admitted to the hospital he sustained a mechanical fall with no evidence of head trauma but he was noted to be confused. On his initial physical examination blood pressure 160/55, heart rate 59, respiratory rate 23, temperature 97.8 F, oxygen saturation 92%, he was awake and alert, his lungs are clear to auscultation bilaterally, heart S1-S2, present, rhythmic, his abdomen was soft, no lower extremity edema.  Sodium 140, potassium 2.8, chloride 106, bicarb 27, glucose 116, BUN 39, creatinine 1.33, white count 9.6, hemoglobin 10.5, hematocrit 94.1, platelets 205. SARS COVID-19 negative.  Urinalysis specific gravity 1.019, 30 protein, 0-5 white cells. Stool positive for C. Difficile.  Head CT showed subacute 3 mm left parafalcine subdural hematoma.  No acute changes in cervical spine.  Chest radiograph with right pleural effusion.  CT of the abdomen pelvis with proctitis, small hematoma in the right gluteus maximus.  Chronic pleural effusions more right than left.  Cholelithiasis with a dilated gallbladder but not acute inflammation umbilical dilatation.  EKG 60 bpm, left axis deviation, intraventricular conduction delay, QTC 528, a sensed/paced, V paced, no significant ST segment or T wave changes.  Patient was admitted to the stepdown unit, serial head CTs showed no change, neurosurgery recommended no surgical  intervention.  For his C. difficile diarrhea he was placed on oral vancomycin.  05/30 patient underwent ultrasound-guided thoracentesis, 1400 cc of clear fluid was obtained, consistent with transudate.  Assessment & Plan:   Principal Problem:   SDH (subdural hematoma) (HCC) Active Problems:   C. difficile diarrhea   HTN (hypertension)   Severe aortic stenosis   Presence of permanent cardiac pacemaker   PAF (paroxysmal atrial fibrillation) (HCC)   CAD (coronary artery disease)   BPH (benign prostatic hyperplasia)   S/P TAVR (transcatheter aortic valve replacement)   Anemia   Bilateral pleural effusion   Palliative care by specialist   Goals of care, counseling/discussion   General weakness   1. 3 mm left anterior para-falcine traumatic subdural hematoma. Follow up head CT on 05/28 with stable to slightly diminished 3 mm left anterior parafalcine subdural hematoma.  Patient has been confused per his daughter at the bedside.   Continue supportive medical care, including PT and OT. He is very weak and deconditioned. Holding on further anticoagulation.   2. C diff diarrhea/ ulcerative colitis. Patient continue to have watery stools, no frank abdominal pain, no nausea or vomiting. Poor oral intake. Wbc is 3,9.   Continue medical therapy with oral vancomycin.   3. Acute on chronic anemia, no signs of acute blood loss/ iron deficiency. hgb this am 7,6 with hct at 24,4. Continue close monitoring of cell count, hold on anticoagulation.  Plan to check iron panel in am, and follow cell count, plan to transfuse PRBC if hgb less than 7,0.  Continue iron supplementation  4. Moderate calorie protein malnutrition, right transudate pleural effusion. Sp thoracentesis.  Continue nutritional support.   Pain control with acetaminophen, add oxycodone in case of severe pain.  5. Atrial fibrillation/ complete heart block sp pacer. Rate controlled with metoprolol, telemetry personally  reviewed, paced rhythm.  6. Chronic diastolic heart failure/ aortic stenosis sp TAVR/ HTN/ dyslipidemia. patient hemodynamically stable. No signs of heart failure decompensation  Positive hypotension with systolic down to 96 and 923 mmHg  Continue with statin therapy.   7. AKI on CKD stage 2, hypokalemia. Renal function with serum cr at 1,41 with K at 3,5 and serum bicarbonate at 22. Continue to hold on diuretics. Patient with poor oral intake, positive active GI losses due to diarrhea.  Add gentle hydration with dextrose and half NS at 50 ml per Hr.  Follow up on renal function in am, avoid hypotension and nephrotoxic medications.  Continue with K phos supplementation.   Patient continue to be at high risk for worsening C diff and renal failure   8. Depression. Continue with escitalopram.   Status is: Inpatient  Remains inpatient appropriate because:IV treatments appropriate due to intensity of illness or inability to take PO   Dispo: The patient is from: Home              Anticipated d/c is to: SNF              Patient currently is not medically stable to d/c.   Difficult to place patient No   DVT prophylaxis: scd    Code Status:    DNR  Family Communication:  I spoke with patient's daugther at the bedside, we talked in detail about patient's condition, plan of care and prognosis and all questions were addressed.      Nutrition Status: Nutrition Problem: Increased nutrient needs Etiology: acute illness Signs/Symptoms: estimated needs Interventions: Ensure Enlive (each supplement provides 350kcal and 20 grams of protein)    Consultants:   Pulmonary   Procedures:   Right thoracentesis   Antimicrobials:  Vancomycin   Subjective: Patient very weak and deconditioned, very poor oral intake and continue to have diarrhea. No nausea or vomiting, no dyspnea, positive right sided chest pain.   Objective: Vitals:   01/27/21 0240 01/27/21 0300 01/27/21 0430 01/27/21  0800  BP: (!) 105/34 (!) 96/30    Pulse: (!) 59 (!) 59    Resp: 20 20    Temp:   98.3 F (36.8 C) 98.8 F (37.1 C)  TempSrc:   Axillary Oral  SpO2: 92% 93%    Weight:      Height:        Intake/Output Summary (Last 24 hours) at 01/27/2021 3007 Last data filed at 01/27/2021 0300 Gross per 24 hour  Intake 287.6 ml  Output --  Net 287.6 ml   Filed Weights   01/22/21 2343 01/23/21 0700  Weight: 81 kg 77 kg    Examination:   General: deconditioned and ill looking appearing  Neurology: Awake and alert, non focal, mild confusion but not agitation  E ENT: positive pallor, no icterus, oral mucosa dry Cardiovascular: No JVD. S1-S2 present, rhythmic.  No lower extremity edema. Pulmonary: positive breath sounds bilaterally, with wheezing, rhonchi or rales. Gastrointestinal. Abdomen soft and non tender Skin. No rashes Musculoskeletal: no joint deformities     Data Reviewed: I have personally reviewed following labs and imaging studies  CBC: Recent Labs  Lab 01/23/21 0255 01/24/21 0121 01/25/21 0249 01/26/21 0250 01/27/21 0220  WBC 9.6 7.7 6.8 6.3 3.9*  NEUTROABS  --   --  5.2 4.9 2.7  HGB 10.5* 9.9* 9.5* 9.1* 7.6*  HCT 33.4* 31.0* 31.3* 28.9*  24.4*  MCV 94.1 93.9 96.6 95.1 98.0  PLT 205 219 217 179 417   Basic Metabolic Panel: Recent Labs  Lab 01/23/21 1152 01/23/21 1550 01/24/21 0121 01/25/21 0249 01/26/21 0250 01/27/21 0220  NA  --  139 137 140 138 138  K  --  3.6 3.8 3.5 3.6 3.5  CL  --  108 107 111 109 110  CO2  --  24 23 21* 23 22  GLUCOSE  --  132* 115* 99 124* 114*  BUN  --  35* 35* 33* 35* 35*  CREATININE  --  1.46* 1.11 1.22 1.30* 1.41*  CALCIUM  --  8.2* 8.2* 8.2* 8.1* 8.2*  MG 2.0  --   --  1.8 2.3 2.3  PHOS  --  2.7  --   --  2.4*  --    GFR: Estimated Creatinine Clearance: 33.4 mL/min (A) (by C-G formula based on SCr of 1.41 mg/dL (H)). Liver Function Tests: Recent Labs  Lab 01/23/21 0255 01/23/21 1550 01/24/21 0121 01/26/21 0250  01/26/21 1641  AST 20  --  27  --   --   ALT 13  --  13  --   --   ALKPHOS 58  --  50  --   --   BILITOT 0.9  --  0.5  --   --   PROT 5.9*  --  5.2*  --  4.5*  ALBUMIN 2.6* 2.3* 2.1* 1.9* 2.4*   Recent Labs  Lab 01/23/21 0255  LIPASE 20   Recent Labs  Lab 01/24/21 2053  AMMONIA 36*   Coagulation Profile: Recent Labs  Lab 01/24/21 1220  INR 1.2   Cardiac Enzymes: No results for input(s): CKTOTAL, CKMB, CKMBINDEX, TROPONINI in the last 168 hours. BNP (last 3 results) Recent Labs    04/23/20 1118  PROBNP 7,476*   HbA1C: No results for input(s): HGBA1C in the last 72 hours. CBG: Recent Labs  Lab 01/23/21 0200 01/25/21 0751  GLUCAP 107* 93   Lipid Profile: No results for input(s): CHOL, HDL, LDLCALC, TRIG, CHOLHDL, LDLDIRECT in the last 72 hours. Thyroid Function Tests: No results for input(s): TSH, T4TOTAL, FREET4, T3FREE, THYROIDAB in the last 72 hours. Anemia Panel: No results for input(s): VITAMINB12, FOLATE, FERRITIN, TIBC, IRON, RETICCTPCT in the last 72 hours.    Radiology Studies: I have reviewed all of the imaging during this hospital visit personally     Scheduled Meds: . atorvastatin  20 mg Oral QODAY  . Chlorhexidine Gluconate Cloth  6 each Topical Q0600  . escitalopram  10 mg Oral Daily  . feeding supplement  237 mL Oral BID BM  . ferrous sulfate  325 mg Oral Daily  . finasteride  5 mg Oral Once per day on Sun Mon Tue Thu Fri Sat  . hydrocerin   Topical Daily  . latanoprost  1 drop Both Eyes QHS  . lidocaine  1 patch Transdermal Q24H  . mouth rinse  15 mL Mouth Rinse BID  . metoprolol succinate  50 mg Oral Q breakfast  . multivitamin with minerals  1 tablet Oral Daily  . phosphorus  250 mg Oral BID  . sodium chloride flush  10 mL Intravenous Q12H  . vancomycin  125 mg Oral QID   Continuous Infusions:   LOS: 4 days        Kerstyn Coryell Gerome Apley, MD

## 2021-01-27 NOTE — Progress Notes (Signed)
Daily Progress Note   Patient Name: Dalton Townsend       Date: 01/27/2021 DOB: 02/20/1923  Age: 87 y.o. MRN#: [id number] Attending Physician: Tawni Millers Primary Care Physician: Lajean Manes, MD Admit Date: 01/22/2021  Reason for Consultation/Follow-up: Establishing goals of care  Subjective: I saw and examined Dalton Townsend today.  He was lying in bed in no distress.  He reports that his breathing feels better following thoracentesis.  He feels that breathing has continued to improve throughout admission.  Reports still having some intermittent abdominal cramping pain.  He had menu and was working to order dinner for himself.  No family at bedside.  Length of Stay: 4  Current Medications: Scheduled Meds:  . atorvastatin  20 mg Oral QODAY  . Chlorhexidine Gluconate Cloth  6 each Topical Q0600  . escitalopram  10 mg Oral Daily  . feeding supplement  237 mL Oral BID BM  . ferrous sulfate  325 mg Oral Daily  . finasteride  5 mg Oral Once per day on Sun Mon Tue Thu Fri Sat  . hydrocerin   Topical Daily  . latanoprost  1 drop Both Eyes QHS  . lidocaine  1 patch Transdermal Q24H  . mouth rinse  15 mL Mouth Rinse BID  . metoprolol succinate  50 mg Oral Q breakfast  . multivitamin with minerals  1 tablet Oral Daily  . phosphorus  250 mg Oral BID  . sodium chloride flush  10 mL Intravenous Q12H  . vancomycin  125 mg Oral QID    Continuous Infusions:   PRN Meds: acetaminophen **OR** acetaminophen, oxyCODONE  Physical Exam         Elderly gentleman resting in bed Appears with generalized weakness Regular rate and rhythm Lungs clear with normal work of breathing Abdomen soft and globally tender to palpation  Vital Signs: BP (!) 139/48 (BP Location: Right  Arm)   Pulse (!) 59   Temp 98.1 F (36.7 C) (Oral)   Resp 19   Ht 5' 11"  (1.803 m)   Wt 77 kg   SpO2 92%   BMI 23.68 kg/m  SpO2: SpO2: 92 % O2 Device: O2 Device: Nasal Cannula O2 Flow Rate: O2 Flow Rate (L/min): 1 L/min  Intake/output summary:   Intake/Output Summary (Last 24 hours) at 01/27/2021 1602 Last  data filed at 01/27/2021 0300 Gross per 24 hour  Intake 140.67 ml  Output --  Net 140.67 ml   LBM: Last BM Date: 01/27/21 Baseline Weight: Weight: 81 kg Most recent weight: Weight: 77 kg       Palliative Assessment/Data: PPS 50%     Patient Active Problem List   Diagnosis Date Noted  . Palliative care by specialist   . Goals of care, counseling/discussion   . General weakness   . Anemia 01/24/2021  . Bilateral pleural effusion   . SDH (subdural hematoma) ([place]) 01/23/2021  . 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 S/P femoral-femoral bypass surgery   . Aortic stenosis 04/11/2018  . Presence of permanent cardiac pacemaker   . PAF (paroxysmal atrial fibrillation) ([place])   . Carotid artery disease ([place])   . CAD (coronary artery disease)   . BPH (benign prostatic hyperplasia)   . GAD (generalized anxiety disorder)   . Acute on chronic congestive heart failure ([place]) 04/02/2018  . Severe aortic stenosis 01/04/2014  . C. difficile diarrhea 06/24/2010  . HTN (hypertension) 06/24/2010  . Ulcerative colitis ([place]) 06/24/2010    Palliative Care Assessment & Plan   Patient Profile:  Patient 87 year old gentleman history of A. fib on chronic anticoagulation, complete heart block status post PPM, hypertension, UC presented to the ED with falls.  Assessment:  Subdura hematoma C Diff Bilateral Pleural Effusions R greater than L Functional decline and recent falls Palliative consult for goals of care.   Recommendations/Plan:  Reports feeling better following thoracentesis.  Plan appears to be skilled facility for trial of rehab.  Recommend  palliative continue to follow at skilled facility.  No other palliative specific recommendations at this time.  We will check in tomorrow to see if his daughter has any questions.  Please call if there are specific areas with which we can be of further assistance in the care of Dalton Townsend moving forward.   Code Status:    Code Status Orders  (From admission, onward)         Start     Ordered   01/23/21 0834  Do not attempt resuscitation (DNR)  Continuous       Question Answer Comment  In the event of cardiac or respiratory ARREST Do not call a "code blue"   In the event of cardiac or respiratory ARREST Do not perform Intubation, CPR, defibrillation or ACLS   In the event of cardiac or respiratory ARREST Use medication by any route, position, wound care, and other measures to relive pain and suffering. May use oxygen, suction and manual treatment of airway obstruction as needed for comfort.   Comments DNR confirmed with pt, dtr      01/23/21 0837        Code Status History    Date Active Date Inactive Code Status Order ID Comments User Context   04/11/2018 1529 04/22/2018 2030 Full Code [id number]  Rexene Alberts, MD Inpatient   04/02/2018 2359 04/07/2018 2040 Full Code [id number]  Vilma Prader, MD ED   Advance Care Planning Activity    Advance Directive Documentation   Flowsheet Row Most Recent Value  Type of Advance Directive Healthcare Power of Attorney, Living will  Pre-existing out of facility DNR order (yellow form or pink MOST form) --  "MOST" Form in Place? --       Prognosis:   Unable to determine  Discharge Planning:  [place] for rehab with Palliative care service  follow-up  Thank you for allowing the Palliative Medicine Team to assist in the care of this patient.   Time In: 1520 Time Out: 1540 Total Time 20 Prolonged Time Billed  no    Greater than 50%  of this time was spent counseling and coordinating care related to the above  assessment and plan.  Micheline Rough, MD  Please contact Palliative Medicine Team phone at [phone] for questions and concerns.

## 2021-01-27 NOTE — Progress Notes (Signed)
NAME:  Dalton Townsend, MRN:  [id number], DOB:  08-28-1923, LOS: 4 ADMISSION DATE:  01/22/2021, CONSULTATION DATE:  01/24/21 REFERRING MD:  Irine Seal, MD CHIEF COMPLAINT:  Pleural Effusion  History of Present Illness:  87 year old male with atrial fibrillation on anticoagulation, complete heart block s/p pacemaker, and ulcerative colitis who was admitted 5/27 after suffering a fall at home. He has been found to have a small SDH. He also has history of diarrhea over recent weeks and found to be c. Diff positive.   Neurosurgery is following for the SDH and he is being treated with PO vanc for the c. Diff.   PCCM has been consulted for evaluation of right pleural effusion noted on chest radiograph  Pertinent  Medical History  Atrial Fibrillation Complete Heart Block s/p pacemaker Ulcerative colitis Severe aortic stenosis s/p tavr  Significant Hospital Events: Including procedures, antibiotic start and stop dates in addition to other pertinent events   . 5/27 admitted with SDH and c. Diff colitis . 5/30 Thoracentesis on right  Interim History / Subjective:   Patient tolerated thoracentesis well yesterday. No complaints this morning.   Objective   Blood pressure (!) 96/30, pulse (!) 59, temperature 98.3 F (36.8 C), temperature source Axillary, resp. rate 20, height 5' 11"  (1.803 m), weight 77 kg, SpO2 93 %.        Intake/Output Summary (Last 24 hours) at 01/27/2021 0739 Last data filed at 01/27/2021 0300 Gross per 24 hour  Intake 287.6 ml  Output --  Net 287.6 ml   Filed Weights   01/22/21 2343 01/23/21 0700  Weight: 81 kg 77 kg    Examination: General: elderly male, no acute distress HENT: [place]/AT, moist mucous membranes, sclera anicteric Lungs: Diminished breath sounds.  No wheezing or rhonchi. Cardiovascular: Regular rate and rhythm, no murmurs. Abdomen: Soft, nontender, nondistended, bowel sounds present. Extremities:, No edema. Neuro: Alert, moving all  extremities, oriented to person GU: Deferred  Labs/imaging that I have personally reviewed  (right click and "Reselect all SmartList Selections" daily)  CT chest 5/27: Large right pleural effusion with compressive atelectasis.  Echo 05/14/2019: LVEF 60-65%.  Moderate reduction in RV systolic function.  RVSP is normal.  CXR 01/26/21: decrease in right pleural effusion, small bilateral effusions present.  No pneumothorax.   Serum: LDH 151, total protein 4.5, albumin 2.4 Pleural Fluid: LDH 59, total protein <3   Resolved Hospital Problem list     Assessment & Plan:   Right pleural effusion - s/p thoracentesis on 5/30 - Pleural fluid studies indicate transudative process likely related to heart failure and hypoalbuminemia. - Recommend adequate nutrition and addition of diuretics if renal function allows.   PCCM will sign off.  Labs   CBC: Recent Labs  Lab 01/23/21 0255 01/24/21 0121 01/25/21 0249 01/26/21 0250 01/27/21 0220  WBC 9.6 7.7 6.8 6.3 3.9*  NEUTROABS  --   --  5.2 4.9 2.7  HGB 10.5* 9.9* 9.5* 9.1* 7.6*  HCT 33.4* 31.0* 31.3* 28.9* 24.4*  MCV 94.1 93.9 96.6 95.1 98.0  PLT 205 219 217 179 409    Basic Metabolic Panel: Recent Labs  Lab 01/23/21 1152 01/23/21 1550 01/24/21 0121 01/25/21 0249 01/26/21 0250 01/27/21 0220  NA  --  139 137 140 138 138  K  --  3.6 3.8 3.5 3.6 3.5  CL  --  108 107 111 109 110  CO2  --  24 23 21* 23 22  GLUCOSE  --  132* 115*  99 124* 114*  BUN  --  35* 35* 33* 35* 35*  CREATININE  --  1.46* 1.11 1.22 1.30* 1.41*  CALCIUM  --  8.2* 8.2* 8.2* 8.1* 8.2*  MG 2.0  --   --  1.8 2.3 2.3  PHOS  --  2.7  --   --  2.4*  --    GFR: Estimated Creatinine Clearance: 33.4 mL/min (A) (by C-G formula based on SCr of 1.41 mg/dL (H)). Recent Labs  Lab 01/24/21 0121 01/25/21 0249 01/26/21 0250 01/27/21 0220  WBC 7.7 6.8 6.3 3.9*    Liver Function Tests: Recent Labs  Lab 01/23/21 0255 01/23/21 1550 01/24/21 0121 01/26/21 0250  01/26/21 1641  AST 20  --  27  --   --   ALT 13  --  13  --   --   ALKPHOS 58  --  50  --   --   BILITOT 0.9  --  0.5  --   --   PROT 5.9*  --  5.2*  --  4.5*  ALBUMIN 2.6* 2.3* 2.1* 1.9* 2.4*   Recent Labs  Lab 01/23/21 0255  LIPASE 20   Recent Labs  Lab 01/24/21 2053  AMMONIA 36*    ABG    Component Value Date/Time   PHART 7.402 04/18/2018 1103   PCO2ART 37.4 04/18/2018 1103   PO2ART 118.0 (H) 04/18/2018 1103   HCO3 23.6 04/18/2018 1103   TCO2 26 09/07/2018 1800   ACIDBASEDEF 1.0 04/18/2018 1103   O2SAT 99.0 04/18/2018 1103     Coagulation Profile: Recent Labs  Lab 01/24/21 1220  INR 1.2    Cardiac Enzymes: No results for input(s): CKTOTAL, CKMB, CKMBINDEX, TROPONINI in the last 168 hours.  HbA1C: Hgb A1c MFr Bld  Date/Time Value Ref Range Status  04/07/2018 09:52 AM 5.4 4.8 - 5.6 % Final    Comment:    (NOTE) Pre diabetes:          5.7%-6.4% Diabetes:              >6.4% Glycemic control for   <7.0% adults with diabetes     CBG: Recent Labs  Lab 01/23/21 0200 01/25/21 0751  GLUCAP 107* 93     Critical care time: n/a    Freda Jackson, MD [facility] Office: [phone]   See Amion for personal pager PCCM on call pager [phone] until 7pm. Please call [place] 7p-7a. [phone]

## 2021-01-28 ENCOUNTER — Other Ambulatory Visit: Payer: Self-pay | Admitting: *Deleted

## 2021-01-28 DIAGNOSIS — J90 Pleural effusion, not elsewhere classified: Secondary | ICD-10-CM | POA: Diagnosis not present

## 2021-01-28 DIAGNOSIS — Z7189 Other specified counseling: Secondary | ICD-10-CM | POA: Diagnosis not present

## 2021-01-28 DIAGNOSIS — A0472 Enterocolitis due to Clostridium difficile, not specified as recurrent: Secondary | ICD-10-CM | POA: Diagnosis not present

## 2021-01-28 DIAGNOSIS — R531 Weakness: Secondary | ICD-10-CM | POA: Diagnosis not present

## 2021-01-28 DIAGNOSIS — S065X9A Traumatic subdural hemorrhage with loss of consciousness of unspecified duration, initial encounter: Secondary | ICD-10-CM | POA: Diagnosis not present

## 2021-01-28 DIAGNOSIS — N400 Enlarged prostate without lower urinary tract symptoms: Secondary | ICD-10-CM | POA: Diagnosis not present

## 2021-01-28 LAB — CYTOLOGY - NON PAP

## 2021-01-28 LAB — CBC WITH DIFFERENTIAL/PLATELET
Abs Immature Granulocytes: 0.03 10*3/uL (ref 0.00–0.07)
Basophils Absolute: 0 10*3/uL (ref 0.0–0.1)
Basophils Relative: 1 %
Eosinophils Absolute: 0.1 10*3/uL (ref 0.0–0.5)
Eosinophils Relative: 1 %
HCT: 27.5 % — ABNORMAL LOW (ref 39.0–52.0)
Hemoglobin: 8.5 g/dL — ABNORMAL LOW (ref 13.0–17.0)
Immature Granulocytes: 1 %
Lymphocytes Relative: 17 %
Lymphs Abs: 0.9 10*3/uL (ref 0.7–4.0)
MCH: 29.9 pg (ref 26.0–34.0)
MCHC: 30.9 g/dL (ref 30.0–36.0)
MCV: 96.8 fL (ref 80.0–100.0)
Monocytes Absolute: 0.3 10*3/uL (ref 0.1–1.0)
Monocytes Relative: 6 %
Neutro Abs: 4.2 10*3/uL (ref 1.7–7.7)
Neutrophils Relative %: 74 %
Platelets: 132 10*3/uL — ABNORMAL LOW (ref 150–400)
RBC: 2.84 MIL/uL — ABNORMAL LOW (ref 4.22–5.81)
RDW: 17.9 % — ABNORMAL HIGH (ref 11.5–15.5)
WBC: 5.6 10*3/uL (ref 4.0–10.5)
nRBC: 0 % (ref 0.0–0.2)

## 2021-01-28 LAB — FERRITIN: Ferritin: 93 ng/mL (ref 24–336)

## 2021-01-28 LAB — BASIC METABOLIC PANEL
Anion gap: 5 (ref 5–15)
BUN: 36 mg/dL — ABNORMAL HIGH (ref 8–23)
CO2: 24 mmol/L (ref 22–32)
Calcium: 8.2 mg/dL — ABNORMAL LOW (ref 8.9–10.3)
Chloride: 111 mmol/L (ref 98–111)
Creatinine, Ser: 1.41 mg/dL — ABNORMAL HIGH (ref 0.61–1.24)
GFR, Estimated: 46 mL/min — ABNORMAL LOW (ref >60)
Glucose, Bld: 109 mg/dL — ABNORMAL HIGH (ref 70–99)
Potassium: 3.5 mmol/L (ref 3.5–5.1)
Sodium: 140 mmol/L (ref 135–145)

## 2021-01-28 LAB — IRON AND TIBC
Iron: 18 ug/dL — ABNORMAL LOW (ref 45–182)
Saturation Ratios: 17 % — ABNORMAL LOW (ref 17.9–39.5)
TIBC: 106 ug/dL — ABNORMAL LOW (ref 250–450)
UIBC: 88 ug/dL

## 2021-01-28 LAB — SARS CORONAVIRUS 2 (TAT 6-24 HRS): SARS Coronavirus 2: NEGATIVE

## 2021-01-28 LAB — TRANSFERRIN: Transferrin: 75 mg/dL — ABNORMAL LOW (ref 180–329)

## 2021-01-28 MED ORDER — POTASSIUM CHLORIDE CRYS ER 20 MEQ PO TBCR
40.0000 meq | EXTENDED_RELEASE_TABLET | Freq: Once | ORAL | Status: AC
  Administered 2021-01-28: 40 meq via ORAL
  Filled 2021-01-28: qty 2

## 2021-01-28 NOTE — Progress Notes (Signed)
Occupational Therapy Treatment Patient Details Name: Dalton Townsend MRN: [id number] DOB: 1923-04-02 Today's Date: 01/28/2021    History of present illness 87 y.o. male admitted on 01/22/21 for falls, multiple with resultant SDH per CT scan.  Pt also with c-diff diarrhea, hypokalemia.  Pt with significant PMH of ulcerative colitis, s/p TAVR, s/p fem-fem bypass, PVD, pacemaker, HTN, essential HTN, CAD, aortic stenosis, chronic CHF, skin CA, L TKA, CABG.   OT comments  Patient agreeable to out of bed activity. Able to sequence log roll in bed needing min A to reach for bed rail then max A to elevate trunk due to weakness in arms and core. Needing increased time and assist to push up from L elbow to sitting edge of bed. Max A x1 to squat pivot after multiple attempts at stand pivot to bedside commode. Patient had loose stool and was able to use arms and lean forward enough for OT to perform perianal care in sitting. After sitting up for ~8 mins and performing grooming/hygiene task patient max x2 to pivot back to bed. Would continue to recommend rehab post acute hospitalization due to significant assist needed and patient living at home with elderly spouse.   Follow Up Recommendations  SNF;Supervision/Assistance - 24 hour    Equipment Recommendations  Hospital bed;Wheelchair (measurements OT);Wheelchair cushion (measurements OT);3 in 1 bedside commode       Precautions / Restrictions Precautions Precautions: Fall Precaution Comments: multiple falls, bowel incontinence Restrictions Weight Bearing Restrictions: No       Mobility Bed Mobility Overal bed mobility: Needs Assistance Bed Mobility: Rolling;Sidelying to Sit;Sit to Supine Rolling: Min assist Sidelying to sit: Max assist   Sit to supine: Max assist;+2 for physical assistance   General bed mobility comments: patient able to sequence rolling needing min A to reach for bed rail. max A for lowering legs and to elevate trunk patient  attempts to use UEs however weak. needing assist to lower trunk and lift legs back onto bed at end of treatment    Transfers Overall transfer level: Needs assistance   Transfers: Squat Pivot Transfers     Squat pivot transfers: Max assist;+2 physical assistance;+2 safety/equipment;From elevated surface     General transfer comment: initial transfer to commode with max A x1, needing max x2 back to bed after sitting up for ~8 mins    Balance Overall balance assessment: Needs assistance Sitting-balance support: Feet supported;Single extremity supported Sitting balance-Leahy Scale: Poor     Standing balance support: During functional activity Standing balance-Leahy Scale: Zero Standing balance comment: max x1-2 for transfers                           ADL either performed or assessed with clinical judgement   ADL Overall ADL's : Needs assistance/impaired     Grooming: Wash/dry face;Wash/dry hands;Set up;Supervision/safety;Sitting Grooming Details (indicate cue type and reason): while seated on commode             Lower Body Dressing: Total assistance;Bed level Lower Body Dressing Details (indicate cue type and reason): to don clean socks Toilet Transfer: Maximal assistance;+2 for physical assistance;+2 for safety/equipment;Cueing for safety;Cueing for sequencing;Squat-pivot;BSC Toilet Transfer Details (indicate cue type and reason): able to squat pivot with max x1 and cues for body mechanics to toilet. max x2 to pivot back to edge of bed after sitting up for ~8 mins Toileting- Clothing Manipulation and Hygiene: Maximal assistance;Sitting/lateral lean Toileting - Clothing Manipulation Details (indicate cue type  and reason): patient is able to use UE strength and lean forward enough for OT to perform perianal care post bowel movement     Functional mobility during ADLs: Maximal assistance;+2 for physical assistance;+2 for safety/equipment;Cueing for safety;Cueing for  sequencing General ADL Comments: patient is very pleasant however very deconditioned needing significant assistance for mobility and self care tasks.               Cognition Arousal/Alertness: Awake/alert Behavior During Therapy: WFL for tasks assessed/performed Overall Cognitive Status: Difficult to assess                                 General Comments: patient following all directions appropriately and engaging in conversation                   Pertinent Vitals/ Pain       Pain Assessment: Faces Faces Pain Scale: Hurts little more Pain Location: wincing with bed mobility Pain Descriptors / Indicators: Grimacing;Guarding Pain Intervention(s): Monitored during session         Frequency  Min 2X/week        Progress Toward Goals  OT Goals(current goals can now be found in the care plan section)  Progress towards OT goals: Progressing toward goals  Acute Rehab OT Goals Patient Stated Goal: use toilet OT Goal Formulation: With patient Time For Goal Achievement: 02/07/21 Potential to Achieve Goals: Fair ADL Goals Pt Will Perform Eating: with set-up;sitting Pt Will Perform Grooming: with set-up;sitting Pt Will Perform Upper Body Dressing: with min assist;sitting Pt Will Transfer to Toilet: with max assist;stand pivot transfer;bedside commode Additional ADL Goal #1: Pt will perform bed mobility with mod assist in preparation for ADL, for repositioning and skin integrity.  Plan Discharge plan remains appropriate       AM-PAC OT "6 Clicks" Daily Activity     Outcome Measure   Help from another person eating meals?: A Little Help from another person taking care of personal grooming?: A Little Help from another person toileting, which includes using toliet, bedpan, or urinal?: A Lot Help from another person bathing (including washing, rinsing, drying)?: A Lot Help from another person to put on and taking off regular upper body clothing?: A  Lot Help from another person to put on and taking off regular lower body clothing?: A Lot 6 Click Score: 14    End of Session Equipment Utilized During Treatment: Gait belt  OT Visit Diagnosis: Unsteadiness on feet (R26.81);Other abnormalities of gait and mobility (R26.89);Muscle weakness (generalized) (M62.81);Pain Pain - part of body:  (peri area)   Activity Tolerance Patient tolerated treatment well   Patient Left in bed;with call bell/phone within reach;with bed alarm set   Nurse Communication Mobility status        Time: 4076-8088 OT Time Calculation (min): 34 min  Charges: OT General Charges $OT Visit: 1 Visit OT Treatments $Self Care/Home Management : 8-22 mins  Delbert Phenix OT OT pager: [place] 01/28/2021, 1:01 PM

## 2021-01-28 NOTE — TOC Progression Note (Signed)
Transition of Care [facility]) - Progression Note    Patient Details  Name: Dalton Townsend MRN: [id number] Date of Birth: 05/25/1923  [facility] CM/SW Contact  Amerika Nourse, Marjie Skiff, RN Phone Number: 01/28/2021, 10:57 AM  Clinical Narrative:    Spoke with wife and daughter Arbie Cookey to update them on SNF bed offers. They have chosen [facility]. [place] liaison contacted to accept bed for tomorrow. Covid test to be ordered by MD.   Expected Discharge Plan: [place] Barriers to Discharge: Continued Medical Work up  Expected Discharge Plan and Services Expected Discharge Plan: [place] In-house Referral: Clinical Social Work Discharge Planning Services: CM Consult Post Acute Care Choice: [place] arrangements for the past 2 months: Single Family Home                  Social Determinants of Health (SDOH) Interventions    Readmission Risk Interventions Readmission Risk Prevention Plan 01/28/2021  Transportation Screening Complete  PCP or Specialist Appt within 3-5 Days Complete  HRI or Home Care Consult Complete  Social Work Consult for [place] Planning/Counseling Complete  Palliative Care Screening Complete  Medication Review [employer]) Complete  Some recent data might be hidden

## 2021-01-28 NOTE — Consult Note (Signed)
   [facility] Inpatient Consult   01/28/2021  Dalton Townsend 1923-07-15 [id number]   Patient is currently active with [place] [facility] for chronic disease management services.  Patient has been engaged by a [place] health coach. Our community based plan of care has focused on disease management and community resource support.    Plan: Per review, current recommendation is for skilled nursing facility. Will continue to follow for progression and disposition.   Of note, [facility] Care Management services does not replace or interfere with any services that are arranged by inpatient case management or social work.  Netta Cedars, MSN, [facility] Liaison Nurse Mobile Phone [phone]  Toll free office [phone]

## 2021-01-28 NOTE — Progress Notes (Addendum)
PROGRESS NOTE    Dalton Townsend  JHE:[id number] DOB: [birth year] DOA: 01/22/2021 PCP: Lajean Manes, MD    Brief Narrative:  [name] was admitted to the hospital with the working diagnosis of traumatic subacute 3 mm left parafalcine subdural hematoma, in the setting of C. difficile diarrhea.   87 year old male past medical history for atrial fibrillation, complete heart block status post pacemaker, hypertension and ulcerative colitis who presented after mechanical fall.  Patient reported multiple falls over several days before hospitalization, PT before he was admitted to the hospital he sustained a mechanical fall with no evidence of head trauma but he was noted to be confused. On his initial physical examination blood pressure 160/55, heart rate 59, respiratory rate 23, temperature 97.8 F, oxygen saturation 92%, he was awake and alert, his lungs are clear to auscultation bilaterally, heart S1-S2, present, rhythmic, his abdomen was soft, no lower extremity edema.  Sodium 140, potassium 2.8, chloride 106, bicarb 27, glucose 116, BUN 39, creatinine 1.33, white count 9.6, hemoglobin 10.5, hematocrit 94.1, platelets 205. SARS COVID-19 negative.  Urinalysis specific gravity 1.019, 30 protein, 0-5 white cells. Stool positive for C. Difficile.  Head CT showed subacute 3 mm left parafalcine subdural hematoma.  No acute changes in cervical spine.  Chest radiograph with right pleural effusion.  CT of the abdomen pelvis with proctitis, small hematoma in the right gluteus maximus.  Chronic pleural effusions more right than left.  Cholelithiasis with a dilated gallbladder but not acute inflammation umbilical dilatation.  EKG 60 bpm, left axis deviation, intraventricular conduction delay, QTC 528, a sensed/paced, V paced, no significant ST segment or T wave changes.  Patient was admitted to the stepdown unit, serial head CTs showed no change, neurosurgery recommended no surgical  intervention.  For his C. difficile diarrhea he was placed on oral vancomycin.  05/30 patient underwent ultrasound-guided thoracentesis, 1400 cc of clear fluid was obtained, consistent with transudate.   Assessment & Plan:   Principal Problem:   SDH (subdural hematoma) (HCC) Active Problems:   C. difficile diarrhea   HTN (hypertension)   Severe aortic stenosis   Presence of permanent cardiac pacemaker   PAF (paroxysmal atrial fibrillation) (HCC)   CAD (coronary artery disease)   BPH (benign prostatic hyperplasia)   S/P TAVR (transcatheter aortic valve replacement)   Anemia   Bilateral pleural effusion   Palliative care by specialist   Goals of care, counseling/discussion   General weakness    1. 3 mm left anterior para-falcine traumatic subdural hematoma. Follow up head CT on 05/28 with stable to slightly diminished 3 mm left anterior parafalcine subdural hematoma.  Mentation continue to improve.   Patient very weak and deconditioned, plan to transfer to SNF likely in am.    2. C diff diarrhea/ ulcerative colitis. Positive diarrhea but clinically improving, no nausea or vomiting.  No abdominal pain.  Wbc is 5,6 today and continue to be afebrile.    Plan to continue enteral vancomycin at least for 14 days.   3. Acute on chronic anemia, no signs of acute blood loss/ iron deficiency. Stable cell count today with Hgb at 8,5 and hct at 27.5  Iron panel with serum iron at 18, TIBC 106, Ferritin 93, and transferrin 75, consistent with anemia of chronic disease combined with iron deficiency.  Continue with oral iron supplementation for now.    4. Moderate calorie protein malnutrition, right transudate pleural effusion. Sp thoracentesis transuder.  No further chest pain.   Continue with nutritional supplementation,.  5. Atrial fibrillation/ complete heart block sp pacer. Continue rate control with metoprolol. No anticoagulation due to intracranial bleed.  6.  Chronic diastolic heart failure/ aortic stenosis sp TAVR/ HTN/ dyslipidemia. Continue close blood pressure monitoring, no signs of hypervolemia.  Continue with metoprolol, hold on diuretic therapy for now.   On statin therapy.   7. AKI on CKD stage 2, hypokalemia.  Stable renal function with serum cr at 1,41 with k at 3,5 and serum bicarbonate at 24,  In the setting of diarrhea, will add 40 meq Kcl today, to prevent hypokalemia.   Continue nutritional support, will discontinue IV fluids.   8. Depression. on escitalopram.   Status is: Inpatient  Remains inpatient appropriate because:Inpatient level of care appropriate due to severity of illness   Dispo: The patient is from: Home              Anticipated d/c is to: SNF              Patient currently is not medically stable to d/c. Possible dc to SNF tomorrow morning    Difficult to place patient No   DVT prophylaxis: scd   Code Status:    dnr   Family Communication:  I spoke with patient's wife and daughter at the bedside, we talked in detail about patient's condition, plan of care and prognosis and all questions were addressed.      Nutrition Status: Nutrition Problem: Increased nutrient needs Etiology: acute illness Signs/Symptoms: estimated needs Interventions: Ensure Enlive (each supplement provides 350kcal and 20 grams of protein)    Consults PCCM Procedures right thoracentesis.    Antimicrobials:   Oral vancomycin     Subjective: Patient continue to be very weak and deconditioned, no further chest pain, no nausea or vomiting, continue to have diarrhea but improving.   Objective: Vitals:   01/27/21 1836 01/27/21 2210 01/28/21 0640 01/28/21 1336  BP: (!) 158/59 (!) 140/56 (!) 165/57 (!) 154/57  Pulse: (!) 59 (!) 59 (!) 59 (!) 59  Resp: 18 17 18 19   Temp: 97.9 F (36.6 C) 98.8 F (37.1 C) 97.8 F (36.6 C) 97.9 F (36.6 C)  TempSrc: Oral Oral Oral Oral  SpO2: 95% 98% 94% 98%  Weight:      Height:         Intake/Output Summary (Last 24 hours) at 01/28/2021 1538 Last data filed at 01/28/2021 1300 Gross per 24 hour  Intake 603 ml  Output 300 ml  Net 303 ml   Filed Weights   01/22/21 2343 01/23/21 0700  Weight: 81 kg 77 kg    Examination:   General: Not in pain or dyspnea deconditioned  Neurology: Awake and alert, non focal, generalized weakness.   E ENT: positive pallor, no icterus, oral mucosa moist Cardiovascular: No JVD. S1-S2 present, rhythmic, no gallops, rubs, or murmurs. No lower extremity edema. Pulmonary: positive breath sounds bilaterally, adequate air movement, no wheezing, rhonchi or rales. Gastrointestinal. Abdomen soft and non tender Skin. No rashes Musculoskeletal: no joint deformities     Data Reviewed: I have personally reviewed following labs and imaging studies  CBC: Recent Labs  Lab 01/24/21 0121 01/25/21 0249 01/26/21 0250 01/27/21 0220 01/28/21 0518  WBC 7.7 6.8 6.3 3.9* 5.6  NEUTROABS  --  5.2 4.9 2.7 4.2  HGB 9.9* 9.5* 9.1* 7.6* 8.5*  HCT 31.0* 31.3* 28.9* 24.4* 27.5*  MCV 93.9 96.6 95.1 98.0 96.8  PLT 219 217 179 152 694*   Basic Metabolic Panel: Recent Labs  Lab 01/23/21 1152 01/23/21 1550 01/24/21 0121 01/25/21 0249 01/26/21 0250 01/27/21 0220 01/28/21 0518  NA  --  139 137 140 138 138 140  K  --  3.6 3.8 3.5 3.6 3.5 3.5  CL  --  108 107 111 109 110 111  CO2  --  24 23 21* 23 22 24   GLUCOSE  --  132* 115* 99 124* 114* 109*  BUN  --  35* 35* 33* 35* 35* 36*  CREATININE  --  1.46* 1.11 1.22 1.30* 1.41* 1.41*  CALCIUM  --  8.2* 8.2* 8.2* 8.1* 8.2* 8.2*  MG 2.0  --   --  1.8 2.3 2.3  --   PHOS  --  2.7  --   --  2.4*  --   --    GFR: Estimated Creatinine Clearance: 33.4 mL/min (A) (by C-G formula based on SCr of 1.41 mg/dL (H)). Liver Function Tests: Recent Labs  Lab 01/23/21 0255 01/23/21 1550 01/24/21 0121 01/26/21 0250 01/26/21 1641  AST 20  --  27  --   --   ALT 13  --  13  --   --   ALKPHOS 58  --  50  --   --    BILITOT 0.9  --  0.5  --   --   PROT 5.9*  --  5.2*  --  4.5*  ALBUMIN 2.6* 2.3* 2.1* 1.9* 2.4*   Recent Labs  Lab 01/23/21 0255  LIPASE 20   Recent Labs  Lab 01/24/21 2053  AMMONIA 36*   Coagulation Profile: Recent Labs  Lab 01/24/21 1220  INR 1.2   Cardiac Enzymes: No results for input(s): CKTOTAL, CKMB, CKMBINDEX, TROPONINI in the last 168 hours. BNP (last 3 results) Recent Labs    04/23/20 1118  PROBNP 7,476*   HbA1C: No results for input(s): HGBA1C in the last 72 hours. CBG: Recent Labs  Lab 01/23/21 0200 01/25/21 0751  GLUCAP 107* 93   Lipid Profile: No results for input(s): CHOL, HDL, LDLCALC, TRIG, CHOLHDL, LDLDIRECT in the last 72 hours. Thyroid Function Tests: No results for input(s): TSH, T4TOTAL, FREET4, T3FREE, THYROIDAB in the last 72 hours. Anemia Panel: Recent Labs    01/28/21 0518  FERRITIN 93  TIBC 106*  IRON 18*      Radiology Studies: I have reviewed all of the imaging during this hospital visit personally     Scheduled Meds: . atorvastatin  20 mg Oral QODAY  . Chlorhexidine Gluconate Cloth  6 each Topical Q0600  . escitalopram  10 mg Oral Daily  . feeding supplement  237 mL Oral BID BM  . ferrous sulfate  325 mg Oral Daily  . finasteride  5 mg Oral Once per day on Sun Mon Tue Thu Fri Sat  . hydrocerin   Topical Daily  . latanoprost  1 drop Both Eyes QHS  . lidocaine  1 patch Transdermal Q24H  . mouth rinse  15 mL Mouth Rinse BID  . metoprolol succinate  50 mg Oral Q breakfast  . multivitamin with minerals  1 tablet Oral Daily  . phosphorus  250 mg Oral BID  . sodium chloride flush  10 mL Intravenous Q12H  . vancomycin  125 mg Oral QID   Continuous Infusions:   LOS: 5 days        Dalton Borawski Gerome Apley, MD

## 2021-01-28 NOTE — Patient Outreach (Signed)
[name]) Care Management  01/28/2021  Dalton Townsend 1923/08/02 [id number]   [place] Discipline closure. Patient has been admitted to hospital. Patient will be followed by Complex Care Coordinator once discharged.  Plan: RN Health Coach Discipline Closure Discipline Closure letter sent to PCP  [place] [facility] [phone]

## 2021-01-28 NOTE — Progress Notes (Signed)
Physical Therapy Treatment Patient Details Name: Dalton Townsend MRN: [id number] DOB: Aug 27, 1923 Today's Date: 01/28/2021    History of Present Illness 87 y.o. male admitted on 01/22/21 for falls, multiple with resultant SDH per CT scan.  Pt also with c-diff diarrhea, hypokalemia.  Pt with significant PMH of ulcerative colitis, s/p TAVR, s/p fem-fem bypass, PVD, pacemaker, HTN, essential HTN, CAD, aortic stenosis, chronic CHF, skin CA, L TKA, CABG.    PT Comments    Requiring max assist of 2 to squat and pivot from BSc to bed and +2 max/total to return to supine. Continue PT for mobility.  Follow Up Recommendations  SNF     Equipment Recommendations  Wheelchair (measurements PT);Wheelchair cushion (measurements PT)    Recommendations for Other Services       Precautions / Restrictions Precautions Precautions: Fall Precaution Comments: multiple falls, bowel incontinence Restrictions Weight Bearing Restrictions: No    Mobility  Bed Mobility Overal bed mobility: Needs Assistance Bed Mobility: Sit to Sidelying Rolling: Min assist Sidelying to sit: Max assist   Sit to supine: Max assist;+2 for physical assistance Sit to sidelying: Max assist;+2 for physical assistance;+2 for safety/equipment General bed mobility comments: assist with legs and trunk to return to side/supine    Transfers Overall transfer level: Needs assistance   Transfers: Squat Pivot Transfers     Squat pivot transfers: Max assist;+2 physical assistance;+2 safety/equipment;From elevated surface     General transfer comment: , needing max x2 back to bed after sitting up for ~8 mins  Ambulation/Gait                 Stairs             Wheelchair Mobility    Modified Rankin (Stroke Patients Only)       Balance Overall balance assessment: Needs assistance Sitting-balance support: Feet supported;Single extremity supported Sitting balance-Leahy Scale: Poor Sitting balance -  Comments: leans on his right elbow and eventually falls back with fatiuge.  Very weak postural muscles.   Standing balance support: During functional activity Standing balance-Leahy Scale: Zero Standing balance comment: max x1-2 for transfers                            Cognition Arousal/Alertness: Awake/alert Behavior During Therapy: WFL for tasks assessed/performed Overall Cognitive Status: Difficult to assess                                 General Comments: patient following all directions appropriately and engaging in conversation      Exercises      General Comments        Pertinent Vitals/Pain Pain Assessment: Faces Faces Pain Scale: Hurts little more Pain Location: wincing with bed mobility Pain Descriptors / Indicators: Grimacing;Guarding Pain Intervention(s): Monitored during session    Home Living                      Prior Function            PT Goals (current goals can now be found in the care plan section) Acute Rehab PT Goals Patient Stated Goal: use toilet Progress towards PT goals: Progressing toward goals    Frequency    Min 2X/week      PT Plan Current plan remains appropriate    Co-evaluation PT/OT/SLP Co-Evaluation/Treatment: Yes Reason for Co-Treatment: For patient/therapist safety PT goals  addressed during session: Mobility/safety with mobility        AM-PAC PT "6 Clicks" Mobility   Outcome Measure  Help needed turning from your back to your side while in a flat bed without using bedrails?: A Lot Help needed moving from lying on your back to sitting on the side of a flat bed without using bedrails?: A Lot Help needed moving to and from a bed to a chair (including a wheelchair)?: Total Help needed standing up from a chair using your arms (e.g., wheelchair or bedside chair)?: Total Help needed to walk in hospital room?: Total Help needed climbing 3-5 steps with a railing? : Total 6 Click Score:  8    End of Session Equipment Utilized During Treatment: Gait belt Activity Tolerance: Patient limited by fatigue;Patient limited by pain Patient left: in bed;with call bell/phone within reach;with bed alarm set   PT Visit Diagnosis: Muscle weakness (generalized) (M62.81);Difficulty in walking, not elsewhere classified (R26.2);Pain     Time: 1901-2224 PT Time Calculation (min) (ACUTE ONLY): 15 min  Charges:  $Therapeutic Activity: 8-22 mins                     Tresa Endo PT Acute Rehabilitation Services Pager [phone] Office [phone]    Claretha Cooper 01/28/2021, 3:23 PM

## 2021-01-28 NOTE — Care Management Important Message (Signed)
Important Message  Patient Details IM Letter placed in Patients room Name: Dalton Townsend MRN: [id number] Date of Birth: 11-Dec-1922   Medicare Important Message Given:  Yes     Kerin Salen 01/28/2021, 8:52 AM

## 2021-01-29 DIAGNOSIS — A0472 Enterocolitis due to Clostridium difficile, not specified as recurrent: Secondary | ICD-10-CM | POA: Diagnosis not present

## 2021-01-29 DIAGNOSIS — I480 Paroxysmal atrial fibrillation: Secondary | ICD-10-CM | POA: Diagnosis not present

## 2021-01-29 DIAGNOSIS — I209 Angina pectoris, unspecified: Secondary | ICD-10-CM | POA: Diagnosis not present

## 2021-01-29 DIAGNOSIS — J90 Pleural effusion, not elsewhere classified: Secondary | ICD-10-CM | POA: Diagnosis not present

## 2021-01-29 DIAGNOSIS — K5180 Other ulcerative colitis without complications: Secondary | ICD-10-CM | POA: Diagnosis not present

## 2021-01-29 DIAGNOSIS — M6281 Muscle weakness (generalized): Secondary | ICD-10-CM | POA: Diagnosis not present

## 2021-01-29 DIAGNOSIS — M25572 Pain in left ankle and joints of left foot: Secondary | ICD-10-CM | POA: Diagnosis not present

## 2021-01-29 DIAGNOSIS — F411 Generalized anxiety disorder: Secondary | ICD-10-CM | POA: Diagnosis not present

## 2021-01-29 DIAGNOSIS — E509 Vitamin A deficiency, unspecified: Secondary | ICD-10-CM | POA: Diagnosis not present

## 2021-01-29 DIAGNOSIS — D649 Anemia, unspecified: Secondary | ICD-10-CM | POA: Diagnosis not present

## 2021-01-29 DIAGNOSIS — R627 Adult failure to thrive: Secondary | ICD-10-CM | POA: Diagnosis not present

## 2021-01-29 DIAGNOSIS — N400 Enlarged prostate without lower urinary tract symptoms: Secondary | ICD-10-CM | POA: Diagnosis not present

## 2021-01-29 DIAGNOSIS — Z950 Presence of cardiac pacemaker: Secondary | ICD-10-CM | POA: Diagnosis not present

## 2021-01-29 DIAGNOSIS — R410 Disorientation, unspecified: Secondary | ICD-10-CM | POA: Diagnosis not present

## 2021-01-29 DIAGNOSIS — Z952 Presence of prosthetic heart valve: Secondary | ICD-10-CM | POA: Diagnosis not present

## 2021-01-29 DIAGNOSIS — K5190 Ulcerative colitis, unspecified, without complications: Secondary | ICD-10-CM | POA: Diagnosis not present

## 2021-01-29 DIAGNOSIS — E785 Hyperlipidemia, unspecified: Secondary | ICD-10-CM | POA: Diagnosis not present

## 2021-01-29 DIAGNOSIS — R600 Localized edema: Secondary | ICD-10-CM | POA: Diagnosis not present

## 2021-01-29 DIAGNOSIS — Z9181 History of falling: Secondary | ICD-10-CM | POA: Diagnosis not present

## 2021-01-29 DIAGNOSIS — W19XXXA Unspecified fall, initial encounter: Secondary | ICD-10-CM | POA: Diagnosis not present

## 2021-01-29 DIAGNOSIS — I10 Essential (primary) hypertension: Secondary | ICD-10-CM | POA: Diagnosis not present

## 2021-01-29 DIAGNOSIS — H44513 Absolute glaucoma, bilateral: Secondary | ICD-10-CM | POA: Diagnosis not present

## 2021-01-29 DIAGNOSIS — I5030 Unspecified diastolic (congestive) heart failure: Secondary | ICD-10-CM | POA: Diagnosis not present

## 2021-01-29 DIAGNOSIS — S065X9A Traumatic subdural hemorrhage with loss of consciousness of unspecified duration, initial encounter: Secondary | ICD-10-CM | POA: Diagnosis not present

## 2021-01-29 DIAGNOSIS — I2510 Atherosclerotic heart disease of native coronary artery without angina pectoris: Secondary | ICD-10-CM | POA: Diagnosis not present

## 2021-01-29 DIAGNOSIS — R609 Edema, unspecified: Secondary | ICD-10-CM | POA: Diagnosis not present

## 2021-01-29 DIAGNOSIS — F339 Major depressive disorder, recurrent, unspecified: Secondary | ICD-10-CM | POA: Diagnosis not present

## 2021-01-29 DIAGNOSIS — I350 Nonrheumatic aortic (valve) stenosis: Secondary | ICD-10-CM | POA: Diagnosis not present

## 2021-01-29 DIAGNOSIS — I5032 Chronic diastolic (congestive) heart failure: Secondary | ICD-10-CM | POA: Diagnosis not present

## 2021-01-29 DIAGNOSIS — I130 Hypertensive heart and chronic kidney disease with heart failure and stage 1 through stage 4 chronic kidney disease, or unspecified chronic kidney disease: Secondary | ICD-10-CM | POA: Diagnosis not present

## 2021-01-29 DIAGNOSIS — D631 Anemia in chronic kidney disease: Secondary | ICD-10-CM | POA: Diagnosis not present

## 2021-01-29 LAB — BASIC METABOLIC PANEL
Anion gap: 5 (ref 5–15)
BUN: 34 mg/dL — ABNORMAL HIGH (ref 8–23)
CO2: 24 mmol/L (ref 22–32)
Calcium: 8.1 mg/dL — ABNORMAL LOW (ref 8.9–10.3)
Chloride: 108 mmol/L (ref 98–111)
Creatinine, Ser: 1.18 mg/dL (ref 0.61–1.24)
GFR, Estimated: 57 mL/min — ABNORMAL LOW (ref >60)
Glucose, Bld: 115 mg/dL — ABNORMAL HIGH (ref 70–99)
Potassium: 3.6 mmol/L (ref 3.5–5.1)
Sodium: 137 mmol/L (ref 135–145)

## 2021-01-29 MED ORDER — VANCOMYCIN HCL 125 MG PO CAPS: 125.0000 mg | ORAL_CAPSULE | Freq: Four times a day (QID) | ORAL | 0 refills | Status: AC

## 2021-01-29 MED ORDER — FUROSEMIDE 40 MG PO TABS: 40.0000 mg | ORAL_TABLET | Freq: Every day | ORAL | Status: AC | PRN

## 2021-01-29 MED ORDER — ENSURE ENLIVE PO LIQD: 237.0000 mL | Freq: Two times a day (BID) | ORAL | 0 refills | Status: AC

## 2021-01-29 NOTE — Progress Notes (Addendum)
Daily Progress Note   Patient Name: Dalton Townsend       Date: 01/29/2021 DOB: September 30, 1922  Age: 87 y.o. MRN#: [id number] Attending Physician: Tawni Millers Primary Care Physician: Lajean Manes, MD Admit Date: 01/22/2021  Reason for Consultation/Follow-up: Establishing goals of care  Subjective: I saw and examined Dalton Townsend today.  He was lying in bed in no distress.  He had eaten part of meal for lunch.  No family at bedside.  He denies complaints or needs today.  More awake and joking about needing "ticket out of here."  I called and was able to reach his daughter, [name].  We reviewed plan for discharge and recommendation for follow-up by outpatient palliative.  She is comfortable with plan as he will be going to [facility] at time of discharge.  Length of Stay: 6  Current Medications: Scheduled Meds:  . atorvastatin  20 mg Oral QODAY  . Chlorhexidine Gluconate Cloth  6 each Topical Q0600  . escitalopram  10 mg Oral Daily  . feeding supplement  237 mL Oral BID BM  . ferrous sulfate  325 mg Oral Daily  . finasteride  5 mg Oral Once per day on Sun Mon Tue Thu Fri Sat  . hydrocerin   Topical Daily  . latanoprost  1 drop Both Eyes QHS  . lidocaine  1 patch Transdermal Q24H  . mouth rinse  15 mL Mouth Rinse BID  . metoprolol succinate  50 mg Oral Q breakfast  . multivitamin with minerals  1 tablet Oral Daily  . sodium chloride flush  10 mL Intravenous Q12H  . vancomycin  125 mg Oral QID    Continuous Infusions:   PRN Meds: acetaminophen **OR** acetaminophen  Physical Exam         Elderly gentleman resting in bed Appears with generalized weakness Regular rate and rhythm Lungs clear with normal work of breathing Abdomen soft and globally tender to  palpation  Vital Signs: BP (!) 159/67 (BP Location: Left Arm)   Pulse (!) 59   Temp 97.7 F (36.5 C) (Oral)   Resp 18   Ht 5' 11"  (1.803 m)   Wt 77 kg   SpO2 93%   BMI 23.68 kg/m  SpO2: SpO2: 93 % O2 Device: O2 Device: Nasal Cannula O2 Flow Rate: O2 Flow Rate (  L/min): 1 L/min  Intake/output summary:   Intake/Output Summary (Last 24 hours) at 01/29/2021 0630 Last data filed at 01/28/2021 1800 Gross per 24 hour  Intake 593 ml  Output 500 ml  Net 93 ml   LBM: Last BM Date: 01/28/21 Baseline Weight: Weight: 81 kg Most recent weight: Weight: 77 kg       Palliative Assessment/Data: PPS 50%     Patient Active Problem List   Diagnosis Date Noted  . Palliative care by specialist   . Goals of care, counseling/discussion   . General weakness   . Anemia 01/24/2021  . Bilateral pleural effusion   . SDH (subdural hematoma) ([place]) 01/23/2021  . 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 S/P femoral-femoral bypass surgery   . Aortic stenosis 04/11/2018  . Presence of permanent cardiac pacemaker   . PAF (paroxysmal atrial fibrillation) ([place])   . Carotid artery disease ([place])   . CAD (coronary artery disease)   . BPH (benign prostatic hyperplasia)   . GAD (generalized anxiety disorder)   . Acute on chronic congestive heart failure ([place]) 04/02/2018  . Severe aortic stenosis 01/04/2014  . C. difficile diarrhea 06/24/2010  . HTN (hypertension) 06/24/2010  . Ulcerative colitis ([place]) 06/24/2010    Palliative Care Assessment & Plan   Patient Profile:  Patient 87 year old gentleman history of A. fib on chronic anticoagulation, complete heart block status post PPM, hypertension, UC presented to the ED with falls.  Assessment:  Subdura hematoma C Diff Bilateral Pleural Effusions R greater than L Functional decline and recent falls Palliative consult for goals of care.   Recommendations/Plan:  Reports feeling better following thoracentesis.  Continue  current care.  Plan for trail of rehab at discharge.  Recommend palliative continue to follow at skilled facility.  I was able to reach his daughter today.  She denies concerns or questions at this point.  No other palliative specific recommendations at this time.  Please call if there are specific areas with which we can be of further assistance in the care of Dalton Townsend moving forward.   Code Status:    Code Status Orders  (From admission, onward)         Start     Ordered   01/23/21 0834  Do not attempt resuscitation (DNR)  Continuous       Question Answer Comment  In the event of cardiac or respiratory ARREST Do not call a "code blue"   In the event of cardiac or respiratory ARREST Do not perform Intubation, CPR, defibrillation or ACLS   In the event of cardiac or respiratory ARREST Use medication by any route, position, wound care, and other measures to relive pain and suffering. May use oxygen, suction and manual treatment of airway obstruction as needed for comfort.   Comments DNR confirmed with pt, dtr      01/23/21 0837        Code Status History    Date Active Date Inactive Code Status Order ID Comments User Context   04/11/2018 1529 04/22/2018 2030 Full Code [id number]  Rexene Alberts, MD Inpatient   04/02/2018 2359 04/07/2018 2040 Full Code [id number]  Vilma Prader, MD ED   Advance Care Planning Activity    Advance Directive Documentation   Flowsheet Row Most Recent Value  Type of Advance Directive Healthcare Power of Attorney, Living will  Pre-existing out of facility DNR order (yellow form or pink MOST form) --  "MOST" Form in Place? --  Prognosis:   Unable to determine  Discharge Planning:  [place] for rehab with Palliative care service follow-up  Thank you for allowing the Palliative Medicine Team to assist in the care of this patient.   Time In: 1200 Time Out: 1220 Total Time 20 Prolonged Time Billed  no    Greater than  50%  of this time was spent counseling and coordinating care related to the above assessment and plan.  Micheline Rough, MD  Please contact Palliative Medicine Team phone at [phone] for questions and concerns.

## 2021-01-29 NOTE — Discharge Summary (Addendum)
Physician Discharge Summary  VONNIE LIGMAN BSW:[id number] DOB: [birth year] DOA: 01/22/2021  PCP: Lajean Manes, MD  Admit date: 01/22/2021 Discharge date: 01/29/2021  Admitted From: Home  Disposition:  SNF   Recommendations for Outpatient Follow-up and new medication changes:  1. Follow up with Dr Felipa Eth in 7 to 10 days.  2. Continue oral vancomycin for 10 more days.  3. Change furosemide to as needed for volume overload. 4. Holding losartan to prevent hypotension 5. Discontinued apixaban due to intracranial bleed.  I spoke over the phone with the patient's daughter about patient's  condition, plan of care, prognosis and all questions were addressed.  Home Health: na   Equipment/Devices: na    Discharge Condition: stable  CODE STATUS: dnr   Diet recommendation:  Regular.    Brief/Interim Summary: Mr. Marszalek was admitted to the hospital with the working diagnosis of traumaticsubacute 3 mm left parafalcine subdural hematoma,in the setting of C. difficile diarrhea.  87 year old male past medical history for atrial fibrillation, complete heart block status post pacemaker, hypertension and ulcerative colitis who presented after mechanical fall. Patient reported multiple falls over several days before hospitalization.  Before he was admitted to the hospital he sustained a mechanical fall with no evidence of head trauma but he was noted to be confused. On his initial physical examination blood pressure 160/55, heart rate 59, respiratory rate 23, temperature 97.8 F, oxygen saturation 92%, he was awake and alert, his lungs were clear to auscultation bilaterally, heart S1-S2, present, rhythmic, his abdomen was soft, no lower extremity edema.  Sodium 140, potassium 2.8, chloride 106, bicarb 27, glucose 116, BUN 39, creatinine 1.33, white count 9.6, hemoglobin 10.5, hematocrit 94.1, platelets 205. SARS COVID-19 negative.  Urinalysis specific gravity 1.019, 30 protein, 0-5 white  cells. Stool positive for C. Difficile.  Head CT showed subacute 3 mm left parafalcine subdural hematoma. No acute changes in cervical spine.  Chest radiograph with right pleural effusion.  CT of the abdomen pelvis with proctitis, small hematoma in the right gluteus maximus. Chronic pleural effusions more right than left. Cholelithiasis with a dilated gallbladder but not acute inflammation or ductal dilatation.  EKG 60 bpm, left axis deviation, intraventricular conduction delay, QTC 528, a sensed/paced, V paced, no significant ST segment or T wave changes.  Patient was admitted to the stepdown unit, serial head CTsshowed no change, neurosurgery recommended no surgical intervention.  For his C. difficile diarrhea he was placed on oral vancomycin with good toleration.  05/30patient underwent ultrasound-guided thoracentesis, 1400 cc of clear fluid was obtained, consistent with transudate.  Patient very weak and deconditioned, will be transfer to SNF for continue physical and occupational therapy.   1.  3 mm left anterior parafalcine traumatic subdural hematoma.  Patient had frequent neurochecks during his hospitalization.  Neurosurgery was consulted, recommended close monitoring and serial head CT. 5/28 head CT showed slightly diminished 3 mm left anterior parafalcine subdural hematoma. Patient mentation improved, and at discharge he is back to baseline. He was seen by physical therapist and occupational therapy, recommendations to continue care at the skilled nursing facility.  2.  C. difficile diarrhea, ulcerative colitis.  Patient was placed on oral vancomycin with good toleration.  His diarrhea has been improving. No leukocytosis or abdominal pain.  Plan to continue oral vancomycin for 10 more days.  3.  Acute on chronic anemia, no signs of acute blood loss, positive iron deficiency.  His hemoglobin and hematocrit remained stable, serum iron 18, TIBC 106, ferritin 93,  transferrin  75, transferrin saturation 17. Discharge hemoglobin 8.5, hematocrit 27.5. Continue oral iron supplementation.   4.  Moderate calorie protein malnutrition, right transudate pleural effusion.  Positive hypoproteinemia, he underwent ultrasound-guided thoracentesis with good toleration. His oxygen saturation at discharge is 93-100% on 1 L of supplemental oxygen per nasal cannula.  5.  Paroxysmal atrial fibrillation, complete heart block status post pacemaker.  Patient was continued on metoprolol with good rate control, anticoagulation on hold due to intracranial bleed.  6.  Chronic diastolic heart failure, aortic stenosis status post-TAVR, hypertension, dyslipidemia.  No signs of acute decompensation, patient will continue taking metoprolol. Blood pressure control with amlodipine, currently holding losartan to prevent hypotension. He is very weak and deconditioned, furosemide has changed to as needed for now. Continue statin therapy.  7.  Acute kidney injury on chronic kidney disease stage II, hypokalemia.  Kidney function has been improving, he received supportive medical therapy including intravenous fluids. At discharge sodium 137, potassium 3.6, chloride 108, bicarb 24, glucose 115, BUN 34, creatinine 1.18.  8.  Depression.  Continue escitalopram.   Discharge Diagnoses:  Principal Problem:   SDH (subdural hematoma) (HCC) Active Problems:   C. difficile diarrhea   HTN (hypertension)   Severe aortic stenosis   Presence of permanent cardiac pacemaker   PAF (paroxysmal atrial fibrillation) (HCC)   CAD (coronary artery disease)   BPH (benign prostatic hyperplasia)   S/P TAVR (transcatheter aortic valve replacement)   Anemia   Bilateral pleural effusion   Palliative care by specialist   Goals of care, counseling/discussion   General weakness    Discharge Instructions   Allergies as of 01/29/2021      Reactions   No Known Allergies       Medication List    STOP  taking these medications   amoxicillin 500 MG capsule Commonly known as: AMOXIL   aspirin 81 MG chewable tablet   Eliquis 5 MG Tabs tablet Generic drug: apixaban   losartan 100 MG tablet Commonly known as: COZAAR     TAKE these medications   acetaminophen 500 MG tablet Commonly known as: TYLENOL Take 1,000 mg by mouth at bedtime.   amLODipine 5 MG tablet Commonly known as: NORVASC Take 2.5 mg by mouth daily.   atorvastatin 20 MG tablet Commonly known as: LIPITOR Take 20 mg by mouth every other day.   balsalazide 750 MG capsule Commonly known as: COLAZAL Take 2,250 mg by mouth 3 (three) times daily.   escitalopram 10 MG tablet Commonly known as: LEXAPRO Take 10 mg by mouth daily.   feeding supplement Liqd Take 237 mLs by mouth 2 (two) times daily between meals.   ferrous sulfate 325 (65 FE) MG tablet Take 325 mg by mouth daily.   finasteride 5 MG tablet Commonly known as: PROSCAR Take 5 mg by mouth See admin instructions. Take one tablet (5 mg) by mouth every day except Wednesdays   furosemide 40 MG tablet Commonly known as: LASIX Take 1 tablet (40 mg total) by mouth daily as needed for fluid or edema. What changed:   how much to take  when to take this  reasons to take this   latanoprost 0.005 % ophthalmic solution Commonly known as: XALATAN Place 1 drop into both eyes at bedtime.   mesalamine 1000 MG suppository Commonly known as: CANASA Place 1,000 mg rectally at bedtime as needed (ulcerative colitis).   metoprolol succinate 50 MG 24 hr tablet Commonly known as: TOPROL-XL Take 50 mg by mouth daily. Take with  or immediately following a meal.   multivitamin with minerals Tabs tablet Take 1 tablet by mouth daily.   vancomycin 125 MG capsule Commonly known as: VANCOCIN Take 1 capsule (125 mg total) by mouth 4 (four) times daily for 10 days.            Discharge Care Instructions  (From admission, onward)         Start     Ordered    01/29/21 0000  Discharge wound care:       Comments: Wound care to bilateral lower extremities (full and partial-thickness open areas and hyperkeratotic plaques): Wash with soap and water, rinse and pat dry.  Apply a thin layer of Eucerin cream to lower extremities.  Cover open areas with Xeroform gauze, top with ABD pads and secure with wrapping with Kerlix roll gauze/paper tape.  Change daily.  Place feet into Prevalon boots. Wound care to full-thickness skin tear on right buttock (traumatic injury, not pressure): Cleans with normal saline, pat gently dry.  Cover with size appropriate piece of folded Xeroform gauze Kellie Simmering [phone]), top with dry gauze 4 x 4 and covered with a silicone foam for sacrum.  Oriented tip away from anus to provide maximum coverage of buttocks.  Turned side to side and minimize time in the supine position   01/29/21 0920          Allergies  Allergen Reactions  . No Known Allergies     Consultations:  Pulmonary   Neurosurgery    Procedures/Studies: CT ABDOMEN PELVIS WO CONTRAST  Result Date: 01/23/2021 CLINICAL DATA:  Generalized weakness 2 recent falls. EXAM: CT ABDOMEN AND PELVIS WITHOUT CONTRAST TECHNIQUE: Multidetector CT imaging of the abdomen and pelvis was performed following the standard protocol without IV contrast. COMPARISON:  04/04/2018 FINDINGS: Lower chest: Cardiomegaly. Partially covered transcatheter aortic valve replacement and EKG leads. Large right and small left pleural effusions with atelectasis. Hepatobiliary: No focal liver abnormality.Very dilated gallbladder with layering high-density material. No regional inflammation or ductal dilatation. Pancreas: Unremarkable. Spleen: Unremarkable. Adrenals/Urinary Tract: Negative adrenals. No hydronephrosis or ureteral stone. 9 mm left upper pole renal calculus. Symmetric renal thinning. Unremarkable bladder. Stomach/Bowel: Thickening of the rectum with submucosal low-density appearance. There is mild  stool distending the rectum. Sigmoid diverticulosis. Vascular/Lymphatic: No acute vascular abnormality. 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 Atheromatous plaque is extensive. Bifemoral bypass which is unremarkable. No mass or adenopathy. Reproductive:Symmetric enlargement with dystrophic calcification. Other: No ascites or pneumoperitoneum. Musculoskeletal: 2 cm hematoma in the medial and lower right gluteus maximus. Advanced spinal degeneration with scoliosis. There is separate dedicated reformats. IMPRESSION: 1. Proctitis. 2. Related to fall, there is a small hematoma in the right gluteus maximus. 3. Chronic pleural effusions, currently large on the right and small on the left. 4. Cholelithiasis with very dilated gallbladder but no acute inflammation or ductal dilatation. 5. Additional chronic findings described above. Electronically Signed   By: Monte Fantasia M.D.   On: 01/23/2021 04:31   CT HEAD WO CONTRAST  Result Date: 01/24/2021 CLINICAL DATA:  Follow-up subdural increased lethargy EXAM: CT HEAD WITHOUT CONTRAST TECHNIQUE: Contiguous axial images were obtained from the base of the skull through the vertex without intravenous contrast. COMPARISON:  CT brain 01/23/2021, 09/07/2018 FINDINGS: Brain: No acute territorial infarction or intracranial mass is visualized. Stable to slightly diminished 3 mm left anterior parafalcine subdural hematoma. Atrophy and chronic small vessel ischemic changes of the white matter. Encephalomalacia within the right frontoparietal region. Small chronic infarct in  the right cerebellum. Stable ventricle size. Vascular: No hyperdense vessels. Carotid and vertebral artery calcification Skull: Normal. Negative for fracture or focal lesion. Sinuses/Orbits: No acute finding. Other: None IMPRESSION: 1. Stable to slightly diminished 3 mm left anterior parafalcine subdural hematoma. No new hemorrhage visualized. 2. Atrophy and chronic small vessel ischemic changes of the  white matter. 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 Electronically Signed   By: Donavan Foil M.D.   On: 01/24/2021 19:23   CT HEAD WO CONTRAST  Result Date: 01/23/2021 CLINICAL DATA:  Follow-up subdural hematoma.  Recent falls. EXAM: CT HEAD WITHOUT CONTRAST TECHNIQUE: Contiguous axial images were obtained from the base of the skull through the vertex without intravenous contrast. COMPARISON:  CT head 01/23/2021, 09/07/2018 FINDINGS: Brain: Small interhemispheric subdural hematoma anteriorly is unchanged from earlier today. This was not present on the study from 2020 and therefore appears acute. No other intracranial hemorrhage Moderate to advanced atrophy. Chronic ischemic changes are seen throughout the white matter. 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 Small chronic infarct right superior cerebellum. Negative for acute infarct or mass. Vascular: Negative for hyperdense vessel. Atherosclerotic calcification. Skull: Negative Sinuses/Orbits: Paranasal sinuses clear. Bilateral cataract extraction Other: None IMPRESSION: Small anterior interhemispheric subdural hematoma unchanged from earlier today. Moderate to advanced atrophy.  Chronic ischemic changes. Electronically Signed   By: Franchot Gallo M.D.   On: 01/23/2021 15:00   CT Head Wo Contrast  Result Date: 01/23/2021 CLINICAL DATA:  General weakness with 2 recent falls per family, pt on blood thinners, hx of HTN and skin ca, prev ct head and c-spine 09/07/18 EXAM: CT HEAD WITHOUT CONTRAST CT CERVICAL SPINE WITHOUT CONTRAST TECHNIQUE: Multidetector CT imaging of the head and cervical spine was performed following the standard protocol without intravenous contrast. Multiplanar CT image reconstructions of the cervical spine were also generated. COMPARISON:  None. FINDINGS: CT HEAD FINDINGS Brain: Patchy and confluent areas of decreased attenuation are noted throughout the deep and periventricular white matter of the  cerebral hemispheres bilaterally, compatible with chronic microvascular ischemic disease. No evidence of large-territorial acute infarction. No parenchymal hemorrhage. No mass lesion. No extra-axial collection. No mass effect or midline shift. No hydrocephalus. Basilar cisterns are patent. Hyper to iso dense thickening along the left falx cerebri measuring up to 3 mm (5:11). Vascular: No hyperdense vessel. Skull: No acute fracture or focal lesion. Atherosclerotic calcifications are present within the cavernous internal carotid and vertebral arteries. Sinuses/Orbits: Paranasal sinuses and mastoid air cells are clear. Bilateral trace pleural effusions. Orbits are unremarkable. Other: None. CT CERVICAL SPINE FINDINGS Alignment: Grade 1 anterolisthesis of C3 on C4. Skull base and vertebrae: Multilevel degenerative changes of the spine. No acute fracture. No aggressive appearing focal osseous lesion or focal pathologic process. Soft tissues and spinal canal: No prevertebral fluid or swelling. No visible canal hematoma. Upper chest: At least moderate volume right pleural effusion. Other: None. IMPRESSION: 1. Question acute to subacute 3 mm left parafalcine subdural hematoma. Recommend follow-up CT in 6 hours. 2. No acute displaced fracture or traumatic listhesis of the cervical spine. 3. At least moderate volume right pleural effusion. These results will be called to the ordering clinician or representative by the Radiologist Assistant, and communication documented in the PACS or [employer]. Electronically Signed   By: Iven Finn M.D.   On: 01/23/2021 04:23   CT Chest Wo Contrast  Result Date: 01/23/2021 CLINICAL DATA:  Chest trauma with pleural effusion. On blood thinners, rule out hemothorax. EXAM:  CT CHEST WITHOUT CONTRAST TECHNIQUE: Multidetector CT imaging of the chest was performed following the standard protocol without IV contrast. COMPARISON:  None. FINDINGS: Cardiovascular: Mildly enlarged heart  size. CABG, transcatheter aortic valve replacement, and pacer implant. Extensive atheromatous calcification. Mediastinum/Nodes: No hematoma or pneumomediastinum. Lungs/Pleura: Large right and small left pleural effusions. There is extensive artifact related to arms down positioning but both effusions are layering and appear fluid density where least affected by artifact. Dependent atelectasis. Upper Abdomen: Partially covered left nephrolithiasis. Musculoskeletal: Diffuse spinal degeneration with scoliosis. No acute finding. IMPRESSION: 1. No visible injury to the chest. 2. Large right and small left pleural effusions which are layering and simple appearing. Electronically Signed   By: Monte Fantasia M.D.   On: 01/23/2021 06:32   CT Cervical Spine Wo Contrast  Result Date: 01/23/2021 CLINICAL DATA:  General weakness with 2 recent falls per family, pt on blood thinners, hx of HTN and skin ca, prev ct head and c-spine 09/07/18 EXAM: CT HEAD WITHOUT CONTRAST CT CERVICAL SPINE WITHOUT CONTRAST TECHNIQUE: Multidetector CT imaging of the head and cervical spine was performed following the standard protocol without intravenous contrast. Multiplanar CT image reconstructions of the cervical spine were also generated. COMPARISON:  None. FINDINGS: CT HEAD FINDINGS Brain: Patchy and confluent areas of decreased attenuation are noted throughout the deep and periventricular white matter of the cerebral hemispheres bilaterally, compatible with chronic microvascular ischemic disease. No evidence of large-territorial acute infarction. No parenchymal hemorrhage. No mass lesion. No extra-axial collection. No mass effect or midline shift. No hydrocephalus. Basilar cisterns are patent. Hyper to iso dense thickening along the left falx cerebri measuring up to 3 mm (5:11). Vascular: No hyperdense vessel. Skull: No acute fracture or focal lesion. Atherosclerotic calcifications are present within the cavernous internal carotid and  vertebral arteries. Sinuses/Orbits: Paranasal sinuses and mastoid air cells are clear. Bilateral trace pleural effusions. Orbits are unremarkable. Other: None. CT CERVICAL SPINE FINDINGS Alignment: Grade 1 anterolisthesis of C3 on C4. Skull base and vertebrae: Multilevel degenerative changes of the spine. No acute fracture. No aggressive appearing focal osseous lesion or focal pathologic process. Soft tissues and spinal canal: No prevertebral fluid or swelling. No visible canal hematoma. Upper chest: At least moderate volume right pleural effusion. Other: None. IMPRESSION: 1. Question acute to subacute 3 mm left parafalcine subdural hematoma. Recommend follow-up CT in 6 hours. 2. No acute displaced fracture or traumatic listhesis of the cervical spine. 3. At least moderate volume right pleural effusion. These results will be called to the ordering clinician or representative by the Radiologist Assistant, and communication documented in the PACS or [employer]. Electronically Signed   By: Iven Finn M.D.   On: 01/23/2021 04:23   CT Lumbar Spine Wo Contrast  Result Date: 01/23/2021 CLINICAL DATA:  Weakness and falls. EXAM: CT LUMBAR SPINE WITHOUT CONTRAST TECHNIQUE: Reformats of the lumbar spine were generated from CT of the abdomen and pelvis. COMPARISON:  None. FINDINGS: Segmentation: 5 lumbar type vertebrae. Alignment: Scoliosis and L4-5 anterolisthesis. Vertebrae: No evidence of fracture or bone lesion. Paraspinal and other soft tissues: Reported separately Disc levels: Generalized disc narrowing, endplate ridging, and disc fissuring. Multilevel facet osteoarthritis with spurring. Notable foraminal impingement on the left at L4-5. Likely moderate spinal stenosis at L2-3 to L4-5. IMPRESSION: 1. Negative for lumbar spine fracture. 2. Advanced spinal degeneration with scoliosis and L4-5 anterolisthesis. Electronically Signed   By: Monte Fantasia M.D.   On: 01/23/2021 04:34   DG CHEST PORT 1  VIEW  Result Date: 01/27/2021 CLINICAL DATA:  Status post thoracentesis with pain EXAM: PORTABLE CHEST 1 VIEW COMPARISON:  Jan 26, 2021 FINDINGS: No appreciable pneumothorax. Persistent small pleural effusion on each side with bibasilar atelectasis. No appreciable consolidation. Heart is prominent with pulmonary vascularity normal. Patient is status post coronary artery bypass grafting and aortic valve replacement. Pacemaker present on the left with leads attached to right atrium and right ventricle. No adenopathy. There is aortic atherosclerosis. No bone lesions. IMPRESSION: No pneumothorax. Persistent small pleural effusions bilaterally with bibasilar atelectasis. Stable cardiac prominence with postoperative changes. Aortic Atherosclerosis (ICD10-I70.0). Electronically Signed   By: Lowella Grip III M.D.   On: 01/27/2021 11:35   DG CHEST PORT 1 VIEW  Result Date: 01/26/2021 CLINICAL DATA:  87 year old male status post right thoracentesis. EXAM: PORTABLE CHEST 1 VIEW COMPARISON:  Earlier radiograph dated 01/26/2021. FINDINGS: Small bilateral pleural effusions. Overall significant decrease in the size of right pleural effusion compared prior radiograph and improvement in aeration of the right lung. No pneumothorax. Stable cardiomediastinal silhouette. Median sternotomy wires, CABG vascular clips, and aortic valve repair. Left pectoral pacemaker device. No acute osseous pathology. IMPRESSION: Decrease in the size of the right pleural effusion. No pneumothorax. Electronically Signed   By: Anner Crete M.D.   On: 01/26/2021 17:26   DG CHEST PORT 1 VIEW  Result Date: 01/26/2021 CLINICAL DATA:  87 year old male with history of pleural effusion. EXAM: PORTABLE CHEST 1 VIEW COMPARISON:  Chest x-ray 01/23/2021. FINDINGS: Moderate right pleural effusion. No definite acute consolidative airspace disease. No left pleural effusion. No pneumothorax. Cephalization of the pulmonary vasculature, without frank  pulmonary edema. Heart size is borderline enlarged. Upper mediastinal contours are within normal limits allowing for patient positioning. Atherosclerosis in the thoracic aorta. Status post median sternotomy for CABG. Status post TAVR. Left-sided pacemaker device in place with lead tips projecting over the expected location of the right atrium and right ventricle. IMPRESSION: 1. Moderate right pleural effusion. 2. Borderline cardiomegaly with pulmonary venous congestion, but no frank pulmonary edema. 3. Aortic atherosclerosis. Electronically Signed   By: Vinnie Langton M.D.   On: 01/26/2021 08:11   DG Chest Portable 1 View  Addendum Date: 01/23/2021   ADDENDUM REPORT: 01/23/2021 04:16 ADDENDUM: These results will be called to the ordering clinician or representative by the Radiologist Assistant, and communication documented in the PACS or [employer]. Electronically Signed   By: Iven Finn M.D.   On: 01/23/2021 04:16   Addendum Date: 01/23/2021   ADDENDUM REPORT: 01/23/2021 04:10 ADDENDUM: Right pleural effusion likely of at least moderate volume. Electronically Signed   By: Iven Finn M.D.   On: 01/23/2021 04:10   Result Date: 01/23/2021 CLINICAL DATA:  Status post multiple falls. EXAM: PORTABLE CHEST 1 VIEW.  Patient is rotated. COMPARISON:  Chest x-ray 09/07/2018 FINDINGS: The heart size and mediastinal contours are unchanged. Aortic arch calcification. Status post aortic valve replacement. Prominence of the hilar vasculature. Limited evaluation of the right apex due to overlying mandible. Bibasilar airspace opacities. No pulmonary edema. Interval development of bilateral trace, right greater than left, pleural effusions. No pneumothorax. No acute osseous abnormality. IMPRESSION: Interval development of bilateral trace, right greater than left, pleural effusions. Bibasilar airspace opacities likely represent atelectasis with superimposed infection/inflammation not excluded. Electronically  Signed: By: Iven Finn M.D. On: 01/23/2021 03:24   VAS [place] UPPER EXTREMITY VENOUS DUPLEX  Result Date: 01/26/2021 UPPER VENOUS STUDY  Patient Name:  JEZREEL JUSTINIANO  Date of Exam:   01/26/2021 Medical  Rec #: [id number]           Accession #:    [id number] Date of Birth: [birth year]           Patient Gender: M Patient Age:   54Y Exam Location:  [facility] Procedure:      VAS [place] UPPER EXTREMITY VENOUS DUPLEX Referring Phys: [place] --------------------------------------------------------------------------------  Indications: Swelling Anticoagulation: Eliquis (prior to admission) for Afib. Limitations: Poor ultrasound/tissue interface and bandages. Comparison Study: No previous exams Performing Technologist: Rogelia Rohrer  Examination Guidelines: A complete evaluation includes B-mode imaging, spectral Doppler, color Doppler, and power Doppler as needed of all accessible portions of each vessel. Bilateral testing is considered an integral part of a complete examination. Limited examinations for reoccurring indications may be performed as noted.  Right Findings: +----------+------------+---------+-----------+----------+-------+ RIGHT     CompressiblePhasicitySpontaneousPropertiesSummary +----------+------------+---------+-----------+----------+-------+ Subclavian    Full       Yes       Yes                      +----------+------------+---------+-----------+----------+-------+  Left Findings: +----------+------------+---------+-----------+----------+-------+ LEFT      CompressiblePhasicitySpontaneousPropertiesSummary +----------+------------+---------+-----------+----------+-------+ IJV           Full       Yes       Yes                      +----------+------------+---------+-----------+----------+-------+ Subclavian    Full       Yes       Yes                      +----------+------------+---------+-----------+----------+-------+ Axillary      Full        Yes       Yes                      +----------+------------+---------+-----------+----------+-------+ Brachial      Full       Yes       Yes                      +----------+------------+---------+-----------+----------+-------+ Radial        Full                                          +----------+------------+---------+-----------+----------+-------+ Ulnar         Full                                          +----------+------------+---------+-----------+----------+-------+ Cephalic      Full                                          +----------+------------+---------+-----------+----------+-------+ Basilic       Full       Yes       Yes                      +----------+------------+---------+-----------+----------+-------+  Summary:  Right: No evidence of thrombosis in the subclavian.  Left: No evidence of deep vein thrombosis in the upper extremity. No evidence of superficial  vein thrombosis in the upper extremity. Unable to image area of AC fossa due to bandages.  *See table(s) above for measurements and observations.  Diagnosing physician: Monica Martinez MD Electronically signed by Monica Martinez MD on 01/26/2021 at 6:26:34 PM.    Final       Procedures: ultrasound guided thoracentesis   Subjective: Patient is feeling better, diarrhea is improving, no nausea or vomiting, no chest pain.   Discharge Exam: Vitals:   01/28/21 2300 01/29/21 0620  BP: (!) 139/54 (!) 159/67  Pulse: (!) 59 (!) 59  Resp: 18 18  Temp: 97.8 F (36.6 C) 97.7 F (36.5 C)  SpO2: 100% 93%   Vitals:   01/28/21 0640 01/28/21 1336 01/28/21 2300 01/29/21 0620  BP: (!) 165/57 (!) 154/57 (!) 139/54 (!) 159/67  Pulse: (!) 59 (!) 59 (!) 59 (!) 59  Resp: 18 19 18 18   Temp: 97.8 F (36.6 C) 97.9 F (36.6 C) 97.8 F (36.6 C) 97.7 F (36.5 C)  TempSrc: Oral Oral Oral Oral  SpO2: 94% 98% 100% 93%  Weight:      Height:        General: Not in pain or dyspnea  Neurology:  Awake and alert, non focal  E ENT: mild pallor, no icterus, oral mucosa moist Cardiovascular: No JVD. S1-S2 present, rhythmic, no gallops, rubs, or murmurs. No lower extremity edema. Pulmonary: positive breath sounds bilaterally with no wheezing, rhonchi or rales. Gastrointestinal. Abdomen soft and non tender Skin. No rashes Musculoskeletal: no joint deformities   The results of significant diagnostics from this hospitalization (including imaging, microbiology, ancillary and laboratory) are listed below for reference.     Microbiology: Recent Results (from the past 240 hour(s))  C Difficile Quick Screen w PCR reflex     Status: Abnormal   Collection Time: 01/23/21  4:35 AM   Specimen: STOOL  Result Value Ref Range Status   C Diff antigen POSITIVE (A) NEGATIVE Final   C Diff toxin POSITIVE (A) NEGATIVE Final   C Diff interpretation Toxin producing C. difficile detected.  Final    Comment: CRITICAL RESULT CALLED TO, READ BACK BY AND VERIFIED WITH: RN J NASH AT [id number] 01/23/21 CRUICKSHANK A Performed at [facility], [place] [address], [place], [place] [address]   Resp Panel by RT-PCR (Flu A&B, Covid) Nasopharyngeal Swab     Status: None   Collection Time: 01/23/21  5:16 AM   Specimen: Nasopharyngeal Swab; Nasopharyngeal(NP) swabs in vial transport medium  Result Value Ref Range Status   SARS Coronavirus 2 by RT PCR NEGATIVE NEGATIVE Final    Comment: (NOTE) SARS-CoV-2 target nucleic acids are NOT DETECTED.  The SARS-CoV-2 RNA is generally detectable in upper respiratory specimens during the acute phase of infection. The lowest concentration of SARS-CoV-2 viral copies this assay can detect is 138 copies/mL. A negative result does not preclude SARS-Cov-2 infection and should not be used as the sole basis for treatment or other patient management decisions. A negative result may occur with  improper specimen collection/handling, submission of specimen other than  nasopharyngeal swab, presence of viral mutation(s) within the areas targeted by this assay, and inadequate number of viral copies(<138 copies/mL). A negative result must be combined with clinical observations, patient history, and epidemiological information. The expected result is Negative.  Fact Sheet for Patients:  EntrepreneurPulse.com.au  Fact Sheet for Healthcare Providers:  IncredibleEmployment.be  This test is no t yet approved or cleared by the [place] and  has been authorized  for detection and/or diagnosis of SARS-CoV-2 by FDA under an Emergency Use Authorization (EUA). This EUA will remain  in effect (meaning this test can be used) for the duration of the COVID-19 declaration under Section 564(b)(1) of the Act, 21 U.S.C.section 360bbb-3(b)(1), unless the authorization is terminated  or revoked sooner.       Influenza A by PCR NEGATIVE NEGATIVE Final   Influenza B by PCR NEGATIVE NEGATIVE Final    Comment: (NOTE) The Xpert Xpress SARS-CoV-2/FLU/RSV plus assay is intended as an aid in the diagnosis of influenza from Nasopharyngeal swab specimens and should not be used as a sole basis for treatment. Nasal washings and aspirates are unacceptable for Xpert Xpress SARS-CoV-2/FLU/RSV testing.  Fact Sheet for Patients: EntrepreneurPulse.com.au  Fact Sheet for Healthcare Providers: IncredibleEmployment.be  This test is not yet approved or cleared by the [place] FDA and has been authorized for detection and/or diagnosis of SARS-CoV-2 by FDA under an Emergency Use Authorization (EUA). This EUA will remain in effect (meaning this test can be used) for the duration of the COVID-19 declaration under Section 564(b)(1) of the Act, 21 U.S.C. section 360bbb-3(b)(1), unless the authorization is terminated or revoked.  Performed at [facility], [place] [address], [place], [place] [address]   MRSA PCR Screening     Status: None   Collection Time: 01/23/21  6:58 AM   Specimen: Nasopharyngeal  Result Value Ref Range Status   MRSA by PCR NEGATIVE NEGATIVE Final    Comment:        The GeneXpert MRSA Assay (FDA approved for NASAL specimens only), is one component of a comprehensive MRSA colonization surveillance program. It is not intended to diagnose MRSA infection nor to guide or monitor treatment for MRSA infections. Performed at [facility], [place] [address], [place], [place] [address]   Body fluid culture w Gram Stain     Status: None (Preliminary result)   Collection Time: 01/26/21  4:14 PM   Specimen: Pleura  Result Value Ref Range Status   Specimen Description   Final    PLEURAL RIGHT Performed at [place] [address] [place], [place] [address]    Special Requests   Final    NONE Performed at [facility], [place] [address] [place], [place] [address]    Gram Stain NO WBC SEEN NO ORGANISMS SEEN   Final   Culture   Final    NO GROWTH 3 DAYS Performed at [facility], [place] [address], [place],  [address]    Report Status PENDING  Incomplete  SARS CORONAVIRUS 2 (TAT 6-24 HRS) Nasopharyngeal Nasopharyngeal Swab     Status: None   Collection Time: 01/28/21 12:35 PM   Specimen: Nasopharyngeal Swab  Result Value Ref Range Status   SARS Coronavirus 2 NEGATIVE NEGATIVE Final    Comment: (NOTE) SARS-CoV-2 target nucleic acids are NOT DETECTED.  The SARS-CoV-2 RNA is generally detectable in upper and lower respiratory specimens during the acute phase of infection. Negative results do not preclude SARS-CoV-2 infection, do not rule out co-infections with other pathogens, and should not be used as the sole basis for treatment or other patient management decisions. Negative results must be combined with clinical observations, patient history, and epidemiological  information. The expected result is Negative.  Fact Sheet for Patients: SugarRoll.be  Fact Sheet for Healthcare Providers: https://www.woods-mathews.com/  This test is not yet approved or cleared by the [place] FDA and  has  been authorized for detection and/or diagnosis of SARS-CoV-2 by FDA under an Emergency Use Authorization (EUA). This EUA will remain  in effect (meaning this test can be used) for the duration of the COVID-19 declaration under Se ction 564(b)(1) of the Act, 21 U.S.C. section 360bbb-3(b)(1), unless the authorization is terminated or revoked sooner.  Performed at [facility], [place] [address], [place], [place] [address]      Labs: BNP (last 3 results) Recent Labs    01/23/21 0255  BNP 101.7*   Basic Metabolic Panel: Recent Labs  Lab 01/23/21 1152 01/23/21 1550 01/24/21 0121 01/25/21 0249 01/26/21 0250 01/27/21 0220 01/28/21 0518 01/29/21 0541  NA  --  139   < > 140 138 138 140 137  K  --  3.6   < > 3.5 3.6 3.5 3.5 3.6  CL  --  108   < > 111 109 110 111 108  CO2  --  24   < > 21* 23 22 24 24   GLUCOSE  --  132*   < > 99 124* 114* 109* 115*  BUN  --  35*   < > 33* 35* 35* 36* 34*  CREATININE  --  1.46*   < > 1.22 1.30* 1.41* 1.41* 1.18  CALCIUM  --  8.2*   < > 8.2* 8.1* 8.2* 8.2* 8.1*  MG 2.0  --   --  1.8 2.3 2.3  --   --   PHOS  --  2.7  --   --  2.4*  --   --   --    < > = values in this interval not displayed.   Liver Function Tests: Recent Labs  Lab 01/23/21 0255 01/23/21 1550 01/24/21 0121 01/26/21 0250 01/26/21 1641  AST 20  --  27  --   --   ALT 13  --  13  --   --   ALKPHOS 58  --  50  --   --   BILITOT 0.9  --  0.5  --   --   PROT 5.9*  --  5.2*  --  4.5*  ALBUMIN 2.6* 2.3* 2.1* 1.9* 2.4*   Recent Labs  Lab 01/23/21 0255  LIPASE 20   Recent Labs  Lab 01/24/21 2053  AMMONIA 36*   CBC: Recent Labs  Lab 01/24/21 0121 01/25/21 0249 01/26/21 0250 01/27/21 0220  01/28/21 0518  WBC 7.7 6.8 6.3 3.9* 5.6  NEUTROABS  --  5.2 4.9 2.7 4.2  HGB 9.9* 9.5* 9.1* 7.6* 8.5*  HCT 31.0* 31.3* 28.9* 24.4* 27.5*  MCV 93.9 96.6 95.1 98.0 96.8  PLT 219 217 179 152 132*   Cardiac Enzymes: No results for input(s): CKTOTAL, CKMB, CKMBINDEX, TROPONINI in the last 168 hours. BNP: Invalid input(s): POCBNP CBG: Recent Labs  Lab 01/23/21 0200 01/25/21 0751  GLUCAP 107* 93   D-Dimer No results for input(s): DDIMER in the last 72 hours. Hgb A1c No results for input(s): HGBA1C in the last 72 hours. Lipid Profile No results for input(s): CHOL, HDL, LDLCALC, TRIG, CHOLHDL, LDLDIRECT in the last 72 hours. Thyroid function studies No results for input(s): TSH, T4TOTAL, T3FREE, THYROIDAB in the last 72 hours.  Invalid input(s): FREET3 Anemia work up Recent Labs    01/28/21 0518  FERRITIN 93  TIBC 106*  IRON 18*   Urinalysis    Component Value Date/Time   COLORURINE YELLOW 01/23/2021 [place] 01/23/2021 0420   LABSPEC 1.019 01/23/2021 0420  PHURINE 5.0 01/23/2021 0420   GLUCOSEU NEGATIVE 01/23/2021 0420   HGBUR NEGATIVE 01/23/2021 0420   BILIRUBINUR NEGATIVE 01/23/2021 0420   KETONESUR NEGATIVE 01/23/2021 0420   PROTEINUR 30 (A) 01/23/2021 0420   UROBILINOGEN 0.2 05/14/2010 1353   NITRITE NEGATIVE 01/23/2021 0420   LEUKOCYTESUR NEGATIVE 01/23/2021 0420   Sepsis Labs Invalid input(s): PROCALCITONIN,  WBC,  LACTICIDVEN Microbiology Recent Results (from the past 240 hour(s))  C Difficile Quick Screen w PCR reflex     Status: Abnormal   Collection Time: 01/23/21  4:35 AM   Specimen: STOOL  Result Value Ref Range Status   C Diff antigen POSITIVE (A) NEGATIVE Final   C Diff toxin POSITIVE (A) NEGATIVE Final   C Diff interpretation Toxin producing C. difficile detected.  Final    Comment: CRITICAL RESULT CALLED TO, READ BACK BY AND VERIFIED WITH: RN J NASH AT 3875 01/23/21 CRUICKSHANK A Performed at [facility],  [place] [address] [place], [place] [address]   Resp Panel by RT-PCR (Flu A&B, Covid) Nasopharyngeal Swab     Status: None   Collection Time: 01/23/21  5:16 AM   Specimen: Nasopharyngeal Swab; Nasopharyngeal(NP) swabs in vial transport medium  Result Value Ref Range Status   SARS Coronavirus 2 by RT PCR NEGATIVE NEGATIVE Final    Comment: (NOTE) SARS-CoV-2 target nucleic acids are NOT DETECTED.  The SARS-CoV-2 RNA is generally detectable in upper respiratory specimens during the acute phase of infection. The lowest concentration of SARS-CoV-2 viral copies this assay can detect is 138 copies/mL. A negative result does not preclude SARS-Cov-2 infection and should not be used as the sole basis for treatment or other patient management decisions. A negative result may occur with  improper specimen collection/handling, submission of specimen other than nasopharyngeal swab, presence of viral mutation(s) within the areas targeted by this assay, and inadequate number of viral copies(<138 copies/mL). A negative result must be combined with clinical observations, patient history, and epidemiological information. The expected result is Negative.  Fact Sheet for Patients:  EntrepreneurPulse.com.au  Fact Sheet for Healthcare Providers:  IncredibleEmployment.be  This test is no t yet approved or cleared by the [place] FDA and  has been authorized for detection and/or diagnosis of SARS-CoV-2 by FDA under an Emergency Use Authorization (EUA). This EUA will remain  in effect (meaning this test can be used) for the duration of the COVID-19 declaration under Section 564(b)(1) of the Act, 21 U.S.C.section 360bbb-3(b)(1), unless the authorization is terminated  or revoked sooner.       Influenza A by PCR NEGATIVE NEGATIVE Final   Influenza B by PCR NEGATIVE NEGATIVE Final    Comment: (NOTE) The Xpert Xpress SARS-CoV-2/FLU/RSV plus assay is intended as an  aid in the diagnosis of influenza from Nasopharyngeal swab specimens and should not be used as a sole basis for treatment. Nasal washings and aspirates are unacceptable for Xpert Xpress SARS-CoV-2/FLU/RSV testing.  Fact Sheet for Patients: EntrepreneurPulse.com.au  Fact Sheet for Healthcare Providers: IncredibleEmployment.be  This test is not yet approved or cleared by the [place] FDA and has been authorized for detection and/or diagnosis of SARS-CoV-2 by FDA under an Emergency Use Authorization (EUA). This EUA will remain in effect (meaning this test can be used) for the duration of the COVID-19 declaration under Section 564(b)(1) of the Act, 21 U.S.C. section 360bbb-3(b)(1), unless the authorization is terminated or revoked.  Performed at [facility], [place] [address], [place], [place] [address]   MRSA PCR  Screening     Status: None   Collection Time: 01/23/21  6:58 AM   Specimen: Nasopharyngeal  Result Value Ref Range Status   MRSA by PCR NEGATIVE NEGATIVE Final    Comment:        The GeneXpert MRSA Assay (FDA approved for NASAL specimens only), is one component of a comprehensive MRSA colonization surveillance program. It is not intended to diagnose MRSA infection nor to guide or monitor treatment for MRSA infections. Performed at [facility], [place] [address] [place],  [place] [address]   Body fluid culture w Gram Stain     Status: None (Preliminary result)   Collection Time: 01/26/21  4:14 PM   Specimen: Pleura  Result Value Ref Range Status   Specimen Description   Final    PLEURAL RIGHT Performed at [place] [address], [place], [place] [address]    Special Requests   Final    NONE Performed at [facility], [place] [address], [place], [place] [address]    Gram Stain NO WBC SEEN NO ORGANISMS SEEN   Final   Culture   Final    NO GROWTH  3 DAYS Performed at [facility], [place] [address] [place], [place] [address]    Report Status PENDING  Incomplete  SARS CORONAVIRUS 2 (TAT 6-24 HRS) Nasopharyngeal Nasopharyngeal Swab     Status: None   Collection Time: 01/28/21 12:35 PM   Specimen: Nasopharyngeal Swab  Result Value Ref Range Status   SARS Coronavirus 2 NEGATIVE NEGATIVE Final    Comment: (NOTE) SARS-CoV-2 target nucleic acids are NOT DETECTED.  The SARS-CoV-2 RNA is generally detectable in upper and lower respiratory specimens during the acute phase of infection. Negative results do not preclude SARS-CoV-2 infection, do not rule out co-infections with other pathogens, and should not be used as the sole basis for treatment or other patient management decisions. Negative results must be combined with clinical observations, patient history, and epidemiological information. The expected result is Negative.  Fact Sheet for Patients: SugarRoll.be  Fact Sheet for Healthcare Providers: https://www.woods-mathews.com/  This test is not yet approved or cleared by the [place] FDA and  has been authorized for detection and/or diagnosis of SARS-CoV-2 by FDA under an Emergency Use Authorization (EUA). This EUA will remain  in effect (meaning this test can be used) for the duration of the COVID-19 declaration under Se ction 564(b)(1) of the Act, 21 U.S.C. section 360bbb-3(b)(1), unless the authorization is terminated or revoked sooner.  Performed at [facility], [place] [address] [place], [place] [address]      Time coordinating discharge: 45 minutes  SIGNED:   Tawni Millers, MD  [facility] 01/29/2021, 9:02 AM

## 2021-01-29 NOTE — TOC Transition Note (Addendum)
Transition of Care [facility]) - CM/SW Discharge Note   Patient Details  Name: DE JAWORSKI MRN: [id number] Date of Birth: May 28, 1923  [facility] CM/SW Contact:  Lynnell Catalan, RN Phone Number: 01/29/2021, 11:14 AM   Clinical Narrative:    Pt to dc to [facility] room 401 today. PTAR to transport and RN to call report to [phone]. Yellow DNR on chart for transport. Daughter [name] in the room informed of DC.   Final next level of care: Skilled Nursing Facility Barriers to Discharge: Continued Medical Work up   Patient Goals and CMS Choice Patient states their goals for this hospitalization and ongoing recovery are:: Go to SNF CMS Medicare.gov Compare Post Acute Care list provided to:: Patient Represenative (must comment) Choice offered to / list presented to : Adult Children   Discharge Plan and Services In-house Referral: Clinical Social Work Discharge Planning Services: CM Consult Post Acute Care Choice: [place]                  Readmission Risk Interventions Readmission Risk Prevention Plan 01/28/2021  Transportation Screening Complete  PCP or Specialist Appt within 3-5 Days Complete  HRI or Home Care Consult Complete  Social Work Consult for [place] Planning/Counseling Complete  Palliative Care Screening Complete  Medication Review Press photographer) Complete  Some recent data might be hidden

## 2021-01-29 NOTE — Progress Notes (Signed)
Called report to snf

## 2021-01-30 DIAGNOSIS — I480 Paroxysmal atrial fibrillation: Secondary | ICD-10-CM | POA: Diagnosis not present

## 2021-01-30 DIAGNOSIS — A0472 Enterocolitis due to Clostridium difficile, not specified as recurrent: Secondary | ICD-10-CM | POA: Diagnosis not present

## 2021-01-30 DIAGNOSIS — Z950 Presence of cardiac pacemaker: Secondary | ICD-10-CM | POA: Diagnosis not present

## 2021-01-30 DIAGNOSIS — I5032 Chronic diastolic (congestive) heart failure: Secondary | ICD-10-CM | POA: Diagnosis not present

## 2021-01-30 DIAGNOSIS — I130 Hypertensive heart and chronic kidney disease with heart failure and stage 1 through stage 4 chronic kidney disease, or unspecified chronic kidney disease: Secondary | ICD-10-CM | POA: Diagnosis not present

## 2021-01-30 DIAGNOSIS — D649 Anemia, unspecified: Secondary | ICD-10-CM | POA: Diagnosis not present

## 2021-01-30 DIAGNOSIS — R600 Localized edema: Secondary | ICD-10-CM | POA: Diagnosis not present

## 2021-01-30 DIAGNOSIS — M25572 Pain in left ankle and joints of left foot: Secondary | ICD-10-CM | POA: Diagnosis not present

## 2021-01-30 DIAGNOSIS — R627 Adult failure to thrive: Secondary | ICD-10-CM | POA: Diagnosis not present

## 2021-01-30 LAB — BODY FLUID CULTURE W GRAM STAIN
Culture: NO GROWTH
Gram Stain: NONE SEEN

## 2021-01-30 LAB — CHOLESTEROL, BODY FLUID: Cholesterol, Fluid: 15 mg/dL

## 2021-01-31 DIAGNOSIS — I110 Hypertensive heart disease with heart failure: Secondary | ICD-10-CM | POA: Diagnosis not present

## 2021-01-31 DIAGNOSIS — Z950 Presence of cardiac pacemaker: Secondary | ICD-10-CM | POA: Diagnosis not present

## 2021-01-31 DIAGNOSIS — A0472 Enterocolitis due to Clostridium difficile, not specified as recurrent: Secondary | ICD-10-CM | POA: Diagnosis not present

## 2021-01-31 DIAGNOSIS — I350 Nonrheumatic aortic (valve) stenosis: Secondary | ICD-10-CM | POA: Diagnosis not present

## 2021-01-31 DIAGNOSIS — I480 Paroxysmal atrial fibrillation: Secondary | ICD-10-CM | POA: Diagnosis not present

## 2021-01-31 DIAGNOSIS — Z9981 Dependence on supplemental oxygen: Secondary | ICD-10-CM | POA: Diagnosis not present

## 2021-01-31 DIAGNOSIS — Z952 Presence of prosthetic heart valve: Secondary | ICD-10-CM | POA: Diagnosis not present

## 2021-01-31 DIAGNOSIS — W19XXXD Unspecified fall, subsequent encounter: Secondary | ICD-10-CM | POA: Diagnosis not present

## 2021-01-31 DIAGNOSIS — S065X0D Traumatic subdural hemorrhage without loss of consciousness, subsequent encounter: Secondary | ICD-10-CM | POA: Diagnosis not present

## 2021-01-31 DIAGNOSIS — F411 Generalized anxiety disorder: Secondary | ICD-10-CM | POA: Diagnosis not present

## 2021-01-31 DIAGNOSIS — J90 Pleural effusion, not elsewhere classified: Secondary | ICD-10-CM | POA: Diagnosis not present

## 2021-01-31 DIAGNOSIS — I2510 Atherosclerotic heart disease of native coronary artery without angina pectoris: Secondary | ICD-10-CM | POA: Diagnosis not present

## 2021-01-31 DIAGNOSIS — N400 Enlarged prostate without lower urinary tract symptoms: Secondary | ICD-10-CM | POA: Diagnosis not present

## 2021-01-31 DIAGNOSIS — Z515 Encounter for palliative care: Secondary | ICD-10-CM | POA: Diagnosis not present

## 2021-01-31 DIAGNOSIS — D649 Anemia, unspecified: Secondary | ICD-10-CM | POA: Diagnosis not present

## 2021-01-31 DIAGNOSIS — I5033 Acute on chronic diastolic (congestive) heart failure: Secondary | ICD-10-CM | POA: Diagnosis not present

## 2021-02-01 DIAGNOSIS — I5033 Acute on chronic diastolic (congestive) heart failure: Secondary | ICD-10-CM | POA: Diagnosis not present

## 2021-02-01 DIAGNOSIS — I350 Nonrheumatic aortic (valve) stenosis: Secondary | ICD-10-CM | POA: Diagnosis not present

## 2021-02-01 DIAGNOSIS — I2510 Atherosclerotic heart disease of native coronary artery without angina pectoris: Secondary | ICD-10-CM | POA: Diagnosis not present

## 2021-02-01 DIAGNOSIS — I480 Paroxysmal atrial fibrillation: Secondary | ICD-10-CM | POA: Diagnosis not present

## 2021-02-01 DIAGNOSIS — S065X0D Traumatic subdural hemorrhage without loss of consciousness, subsequent encounter: Secondary | ICD-10-CM | POA: Diagnosis not present

## 2021-02-01 DIAGNOSIS — I110 Hypertensive heart disease with heart failure: Secondary | ICD-10-CM | POA: Diagnosis not present

## 2021-02-12 ENCOUNTER — Encounter: Payer: Self-pay | Admitting: *Deleted

## 2021-02-12 NOTE — Telephone Encounter (Signed)
This encounter was created in error - please disregard.

## 2021-02-27 DEATH — deceased

## 2021-03-04 ENCOUNTER — Ambulatory Visit: Payer: Medicare Other | Admitting: Podiatry

## 2021-05-13 ENCOUNTER — Ambulatory Visit: Payer: Medicare Other | Admitting: Cardiology
